# Patient Record
Sex: Male | Born: 1950 | Race: White | Hispanic: No | Marital: Married | Smoking: Current every day smoker
Health system: Southern US, Community
[De-identification: ages and names within clinical notes are randomized; demographics above are authoritative.]

## PROBLEM LIST (undated history)

## (undated) DIAGNOSIS — Z720 Tobacco use: Secondary | ICD-10-CM

## (undated) DIAGNOSIS — J449 Chronic obstructive pulmonary disease, unspecified: Secondary | ICD-10-CM

## (undated) DIAGNOSIS — I10 Essential (primary) hypertension: Secondary | ICD-10-CM

## (undated) DIAGNOSIS — E119 Type 2 diabetes mellitus without complications: Secondary | ICD-10-CM

## (undated) DIAGNOSIS — N189 Chronic kidney disease, unspecified: Secondary | ICD-10-CM

## (undated) HISTORY — PX: LUNG SURGERY: SHX703

---

## 2021-12-21 ENCOUNTER — Emergency Department (HOSPITAL_COMMUNITY): Payer: Medicare HMO

## 2021-12-21 ENCOUNTER — Inpatient Hospital Stay (HOSPITAL_COMMUNITY)
Admission: EM | Admit: 2021-12-21 | Discharge: 2021-12-31 | DRG: 064 | Disposition: A | Discharge status: Rehabilitation Facility | Payer: Medicare HMO | Attending: Family Medicine | Admitting: Family Medicine

## 2021-12-21 ENCOUNTER — Inpatient Hospital Stay (HOSPITAL_COMMUNITY): Payer: Medicare HMO

## 2021-12-21 ENCOUNTER — Other Ambulatory Visit: Payer: Self-pay

## 2021-12-21 ENCOUNTER — Encounter (HOSPITAL_COMMUNITY): Payer: Self-pay | Admitting: Neurology

## 2021-12-21 DIAGNOSIS — J441 Chronic obstructive pulmonary disease with (acute) exacerbation: Secondary | ICD-10-CM | POA: Diagnosis present

## 2021-12-21 DIAGNOSIS — E872 Acidosis, unspecified: Secondary | ICD-10-CM

## 2021-12-21 DIAGNOSIS — R627 Adult failure to thrive: Secondary | ICD-10-CM | POA: Diagnosis not present

## 2021-12-21 DIAGNOSIS — J449 Chronic obstructive pulmonary disease, unspecified: Secondary | ICD-10-CM | POA: Diagnosis present

## 2021-12-21 DIAGNOSIS — I6503 Occlusion and stenosis of bilateral vertebral arteries: Secondary | ICD-10-CM | POA: Diagnosis present

## 2021-12-21 DIAGNOSIS — G8191 Hemiplegia, unspecified affecting right dominant side: Secondary | ICD-10-CM | POA: Diagnosis present

## 2021-12-21 DIAGNOSIS — R609 Edema, unspecified: Secondary | ICD-10-CM | POA: Diagnosis not present

## 2021-12-21 DIAGNOSIS — E1065 Type 1 diabetes mellitus with hyperglycemia: Secondary | ICD-10-CM | POA: Diagnosis not present

## 2021-12-21 DIAGNOSIS — G9341 Metabolic encephalopathy: Secondary | ICD-10-CM | POA: Diagnosis present

## 2021-12-21 DIAGNOSIS — R140 Abdominal distension (gaseous): Secondary | ICD-10-CM | POA: Diagnosis not present

## 2021-12-21 DIAGNOSIS — Z7984 Long term (current) use of oral hypoglycemic drugs: Secondary | ICD-10-CM

## 2021-12-21 DIAGNOSIS — R911 Solitary pulmonary nodule: Secondary | ICD-10-CM

## 2021-12-21 DIAGNOSIS — I610 Nontraumatic intracerebral hemorrhage in hemisphere, subcortical: Secondary | ICD-10-CM | POA: Diagnosis present

## 2021-12-21 DIAGNOSIS — J690 Pneumonitis due to inhalation of food and vomit: Secondary | ICD-10-CM | POA: Diagnosis present

## 2021-12-21 DIAGNOSIS — G4730 Sleep apnea, unspecified: Secondary | ICD-10-CM | POA: Diagnosis present

## 2021-12-21 DIAGNOSIS — Z720 Tobacco use: Secondary | ICD-10-CM | POA: Diagnosis not present

## 2021-12-21 DIAGNOSIS — E785 Hyperlipidemia, unspecified: Secondary | ICD-10-CM | POA: Diagnosis present

## 2021-12-21 DIAGNOSIS — J9601 Acute respiratory failure with hypoxia: Secondary | ICD-10-CM | POA: Diagnosis not present

## 2021-12-21 DIAGNOSIS — I69151 Hemiplegia and hemiparesis following nontraumatic intracerebral hemorrhage affecting right dominant side: Secondary | ICD-10-CM | POA: Diagnosis not present

## 2021-12-21 DIAGNOSIS — F17210 Nicotine dependence, cigarettes, uncomplicated: Secondary | ICD-10-CM | POA: Diagnosis present

## 2021-12-21 DIAGNOSIS — I10 Essential (primary) hypertension: Secondary | ICD-10-CM | POA: Diagnosis present

## 2021-12-21 DIAGNOSIS — I615 Nontraumatic intracerebral hemorrhage, intraventricular: Secondary | ICD-10-CM | POA: Diagnosis present

## 2021-12-21 DIAGNOSIS — F10931 Alcohol use, unspecified with withdrawal delirium: Secondary | ICD-10-CM

## 2021-12-21 DIAGNOSIS — N1831 Chronic kidney disease, stage 3a: Secondary | ICD-10-CM | POA: Diagnosis not present

## 2021-12-21 DIAGNOSIS — Z808 Family history of malignant neoplasm of other organs or systems: Secondary | ICD-10-CM

## 2021-12-21 DIAGNOSIS — G9340 Encephalopathy, unspecified: Secondary | ICD-10-CM

## 2021-12-21 DIAGNOSIS — Z881 Allergy status to other antibiotic agents status: Secondary | ICD-10-CM

## 2021-12-21 DIAGNOSIS — N179 Acute kidney failure, unspecified: Secondary | ICD-10-CM | POA: Insufficient documentation

## 2021-12-21 DIAGNOSIS — E54 Ascorbic acid deficiency: Secondary | ICD-10-CM

## 2021-12-21 DIAGNOSIS — Z885 Allergy status to narcotic agent status: Secondary | ICD-10-CM

## 2021-12-21 DIAGNOSIS — R471 Dysarthria and anarthria: Secondary | ICD-10-CM | POA: Diagnosis present

## 2021-12-21 DIAGNOSIS — J9600 Acute respiratory failure, unspecified whether with hypoxia or hypercapnia: Secondary | ICD-10-CM

## 2021-12-21 DIAGNOSIS — Z79899 Other long term (current) drug therapy: Secondary | ICD-10-CM

## 2021-12-21 DIAGNOSIS — Z66 Do not resuscitate: Secondary | ICD-10-CM | POA: Diagnosis not present

## 2021-12-21 DIAGNOSIS — R1312 Dysphagia, oropharyngeal phase: Secondary | ICD-10-CM | POA: Diagnosis not present

## 2021-12-21 DIAGNOSIS — F10231 Alcohol dependence with withdrawal delirium: Secondary | ICD-10-CM | POA: Diagnosis not present

## 2021-12-21 DIAGNOSIS — I6523 Occlusion and stenosis of bilateral carotid arteries: Secondary | ICD-10-CM | POA: Diagnosis present

## 2021-12-21 DIAGNOSIS — E8721 Acute metabolic acidosis: Secondary | ICD-10-CM | POA: Diagnosis not present

## 2021-12-21 DIAGNOSIS — Z7141 Alcohol abuse counseling and surveillance of alcoholic: Secondary | ICD-10-CM

## 2021-12-21 DIAGNOSIS — F17200 Nicotine dependence, unspecified, uncomplicated: Secondary | ICD-10-CM | POA: Diagnosis not present

## 2021-12-21 DIAGNOSIS — E1169 Type 2 diabetes mellitus with other specified complication: Secondary | ICD-10-CM | POA: Diagnosis not present

## 2021-12-21 DIAGNOSIS — Z825 Family history of asthma and other chronic lower respiratory diseases: Secondary | ICD-10-CM

## 2021-12-21 DIAGNOSIS — I639 Cerebral infarction, unspecified: Secondary | ICD-10-CM | POA: Diagnosis not present

## 2021-12-21 DIAGNOSIS — E559 Vitamin D deficiency, unspecified: Secondary | ICD-10-CM | POA: Diagnosis present

## 2021-12-21 DIAGNOSIS — Z7189 Other specified counseling: Secondary | ICD-10-CM | POA: Diagnosis not present

## 2021-12-21 DIAGNOSIS — Z85828 Personal history of other malignant neoplasm of skin: Secondary | ICD-10-CM | POA: Diagnosis not present

## 2021-12-21 DIAGNOSIS — I619 Nontraumatic intracerebral hemorrhage, unspecified: Secondary | ICD-10-CM | POA: Diagnosis present

## 2021-12-21 DIAGNOSIS — R1310 Dysphagia, unspecified: Secondary | ICD-10-CM | POA: Diagnosis not present

## 2021-12-21 DIAGNOSIS — Z7982 Long term (current) use of aspirin: Secondary | ICD-10-CM

## 2021-12-21 DIAGNOSIS — I82401 Acute embolism and thrombosis of unspecified deep veins of right lower extremity: Secondary | ICD-10-CM | POA: Diagnosis not present

## 2021-12-21 DIAGNOSIS — Z515 Encounter for palliative care: Secondary | ICD-10-CM | POA: Diagnosis not present

## 2021-12-21 DIAGNOSIS — G479 Sleep disorder, unspecified: Secondary | ICD-10-CM | POA: Diagnosis not present

## 2021-12-21 DIAGNOSIS — E119 Type 2 diabetes mellitus without complications: Secondary | ICD-10-CM

## 2021-12-21 DIAGNOSIS — I161 Hypertensive emergency: Secondary | ICD-10-CM | POA: Diagnosis present

## 2021-12-21 DIAGNOSIS — I6389 Other cerebral infarction: Secondary | ICD-10-CM | POA: Diagnosis not present

## 2021-12-21 DIAGNOSIS — Z888 Allergy status to other drugs, medicaments and biological substances status: Secondary | ICD-10-CM

## 2021-12-21 DIAGNOSIS — Z823 Family history of stroke: Secondary | ICD-10-CM | POA: Diagnosis not present

## 2021-12-21 DIAGNOSIS — R414 Neurologic neglect syndrome: Secondary | ICD-10-CM | POA: Diagnosis present

## 2021-12-21 DIAGNOSIS — R0603 Acute respiratory distress: Secondary | ICD-10-CM | POA: Diagnosis not present

## 2021-12-21 DIAGNOSIS — E781 Pure hyperglyceridemia: Secondary | ICD-10-CM | POA: Diagnosis present

## 2021-12-21 DIAGNOSIS — R29709 NIHSS score 9: Secondary | ICD-10-CM | POA: Diagnosis present

## 2021-12-21 DIAGNOSIS — E878 Other disorders of electrolyte and fluid balance, not elsewhere classified: Secondary | ICD-10-CM | POA: Diagnosis not present

## 2021-12-21 DIAGNOSIS — F1090 Alcohol use, unspecified, uncomplicated: Secondary | ICD-10-CM

## 2021-12-21 DIAGNOSIS — R809 Proteinuria, unspecified: Secondary | ICD-10-CM | POA: Diagnosis not present

## 2021-12-21 DIAGNOSIS — F1010 Alcohol abuse, uncomplicated: Secondary | ICD-10-CM | POA: Diagnosis not present

## 2021-12-21 DIAGNOSIS — E875 Hyperkalemia: Secondary | ICD-10-CM | POA: Diagnosis present

## 2021-12-21 HISTORY — DX: Essential (primary) hypertension: I10

## 2021-12-21 HISTORY — DX: Chronic kidney disease, unspecified: N18.9

## 2021-12-21 HISTORY — DX: Chronic obstructive pulmonary disease, unspecified: J44.9

## 2021-12-21 HISTORY — DX: Type 2 diabetes mellitus without complications: E11.9

## 2021-12-21 HISTORY — DX: Tobacco use: Z72.0

## 2021-12-21 LAB — I-STAT CHEM 8, ED
BUN: 30 mg/dL — ABNORMAL HIGH (ref 8–23)
Calcium, Ion: 1.08 mmol/L — ABNORMAL LOW (ref 1.15–1.40)
Chloride: 106 mmol/L (ref 98–111)
Creatinine, Ser: 1.2 mg/dL (ref 0.61–1.24)
Glucose, Bld: 118 mg/dL — ABNORMAL HIGH (ref 70–99)
HCT: 42 % (ref 39.0–52.0)
Hemoglobin: 14.3 g/dL (ref 13.0–17.0)
Potassium: 4.4 mmol/L (ref 3.5–5.1)
Sodium: 136 mmol/L (ref 135–145)
TCO2: 20 mmol/L — ABNORMAL LOW (ref 22–32)

## 2021-12-21 LAB — COMPREHENSIVE METABOLIC PANEL
ALT: 17 U/L (ref 0–44)
AST: 22 U/L (ref 15–41)
Albumin: 3.1 g/dL — ABNORMAL LOW (ref 3.5–5.0)
Alkaline Phosphatase: 51 U/L (ref 38–126)
Anion gap: 10 (ref 5–15)
BUN: 32 mg/dL — ABNORMAL HIGH (ref 8–23)
CO2: 22 mmol/L (ref 22–32)
Calcium: 8.9 mg/dL (ref 8.9–10.3)
Chloride: 103 mmol/L (ref 98–111)
Creatinine, Ser: 1.36 mg/dL — ABNORMAL HIGH (ref 0.61–1.24)
GFR, Estimated: 57 mL/min — ABNORMAL LOW (ref >60)
Glucose, Bld: 129 mg/dL — ABNORMAL HIGH (ref 70–99)
Potassium: 4.8 mmol/L (ref 3.5–5.1)
Sodium: 135 mmol/L (ref 135–145)
Total Bilirubin: 0.2 mg/dL — ABNORMAL LOW (ref 0.3–1.2)
Total Protein: 5.8 g/dL — ABNORMAL LOW (ref 6.5–8.1)

## 2021-12-21 LAB — MRSA NEXT GEN BY PCR, NASAL: MRSA by PCR Next Gen: NOT DETECTED

## 2021-12-21 LAB — CBC
HCT: 43.9 % (ref 39.0–52.0)
Hemoglobin: 14.8 g/dL (ref 13.0–17.0)
MCH: 30 pg (ref 26.0–34.0)
MCHC: 33.7 g/dL (ref 30.0–36.0)
MCV: 89 fL (ref 80.0–100.0)
Platelets: 286 10*3/uL (ref 150–400)
RBC: 4.93 MIL/uL (ref 4.22–5.81)
RDW: 12.8 % (ref 11.5–15.5)
WBC: 6.8 10*3/uL (ref 4.0–10.5)
nRBC: 0 % (ref 0.0–0.2)

## 2021-12-21 LAB — TYPE AND SCREEN
ABO/RH(D): A POS
Antibody Screen: NEGATIVE

## 2021-12-21 LAB — ABO/RH: ABO/RH(D): A POS

## 2021-12-21 LAB — DIFFERENTIAL
Abs Immature Granulocytes: 0.03 10*3/uL (ref 0.00–0.07)
Basophils Absolute: 0.1 10*3/uL (ref 0.0–0.1)
Basophils Relative: 1 %
Eosinophils Absolute: 0.2 10*3/uL (ref 0.0–0.5)
Eosinophils Relative: 3 %
Immature Granulocytes: 0 %
Lymphocytes Relative: 17 %
Lymphs Abs: 1.2 10*3/uL (ref 0.7–4.0)
Monocytes Absolute: 0.8 10*3/uL (ref 0.1–1.0)
Monocytes Relative: 11 %
Neutro Abs: 4.6 10*3/uL (ref 1.7–7.7)
Neutrophils Relative %: 68 %

## 2021-12-21 LAB — HEMOGLOBIN A1C
Hgb A1c MFr Bld: 6.1 % — ABNORMAL HIGH (ref 4.8–5.6)
Mean Plasma Glucose: 128.37 mg/dL

## 2021-12-21 LAB — LIPID PANEL
Cholesterol: 321 mg/dL — ABNORMAL HIGH (ref 0–200)
HDL: 61 mg/dL (ref >40)
LDL Cholesterol: 192 mg/dL — ABNORMAL HIGH (ref 0–99)
Total CHOL/HDL Ratio: 5.3 RATIO
Triglycerides: 339 mg/dL — ABNORMAL HIGH (ref <150)
VLDL: 68 mg/dL — ABNORMAL HIGH (ref 0–40)

## 2021-12-21 LAB — GLUCOSE, CAPILLARY
Glucose-Capillary: 143 mg/dL — ABNORMAL HIGH (ref 70–99)
Glucose-Capillary: 133 mg/dL — ABNORMAL HIGH (ref 70–99)
Glucose-Capillary: 132 mg/dL — ABNORMAL HIGH (ref 70–99)

## 2021-12-21 LAB — APTT: aPTT: 29 seconds (ref 24–36)

## 2021-12-21 LAB — ETHANOL: Alcohol, Ethyl (B): <10 mg/dL (ref <10)

## 2021-12-21 LAB — PROTIME-INR
INR: 0.9 (ref 0.8–1.2)
Prothrombin Time: 12 seconds (ref 11.4–15.2)

## 2021-12-21 LAB — HIV ANTIBODY (ROUTINE TESTING W REFLEX): HIV Screen 4th Generation wRfx: NONREACTIVE

## 2021-12-21 IMAGING — CT CT HEAD CODE STROKE
3 series · 15 of 47 positions shown, 18 images · non-contrast
Comparison: None Available.

CLINICAL DATA: Code stroke.



[Series 3: head 5.0 st · axial · 0.44mm/px · z∈[-98,+52]mm · 9 of 36 slices shown, 12 images]
[im 3/36  brain]
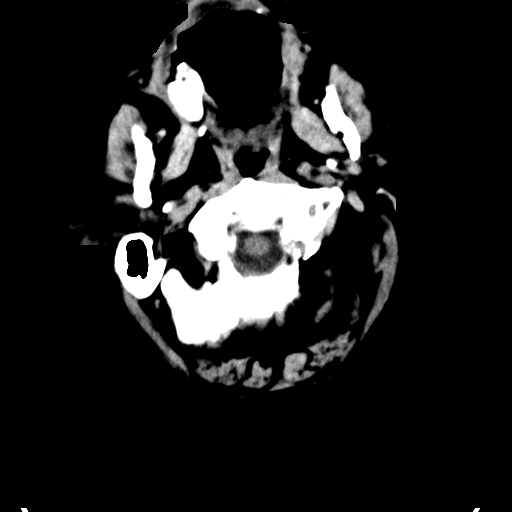
[im 3/36  bone]
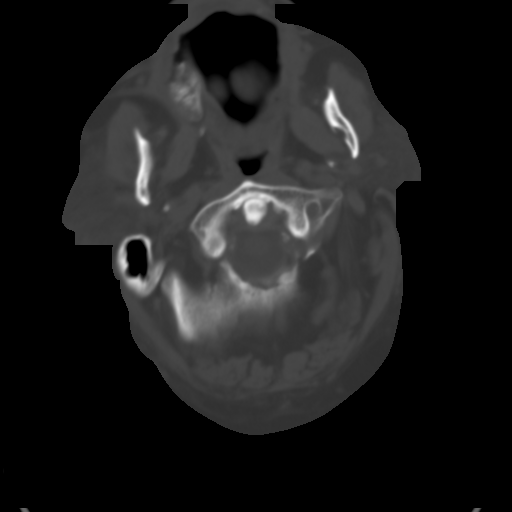
[im 7/36  brain]
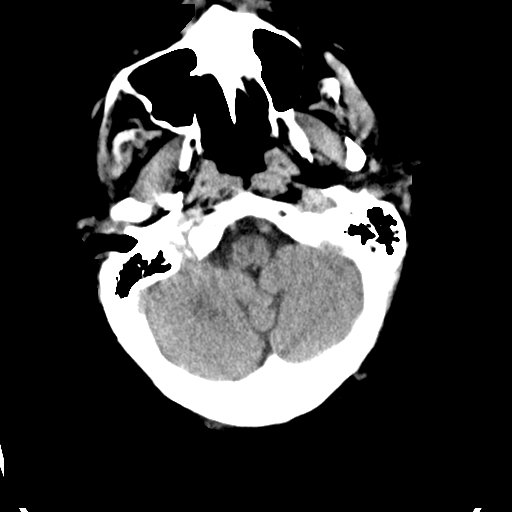
[im 10/36  brain]
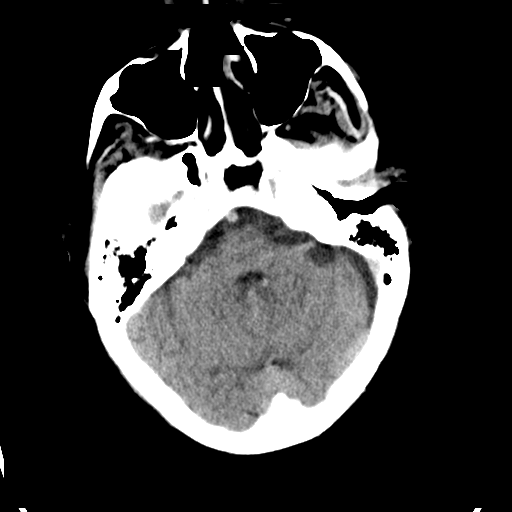
[im 14/36  brain]
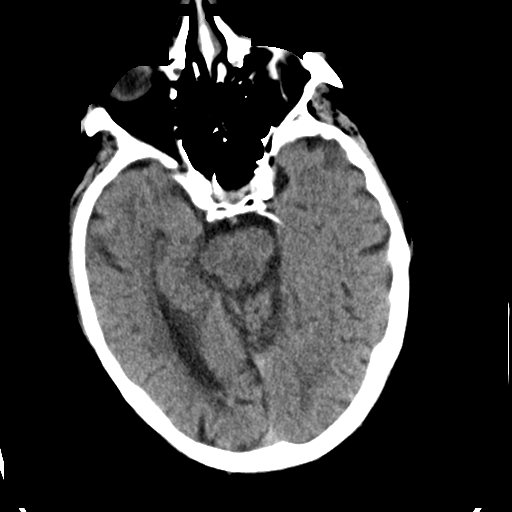
[im 19/36  brain]
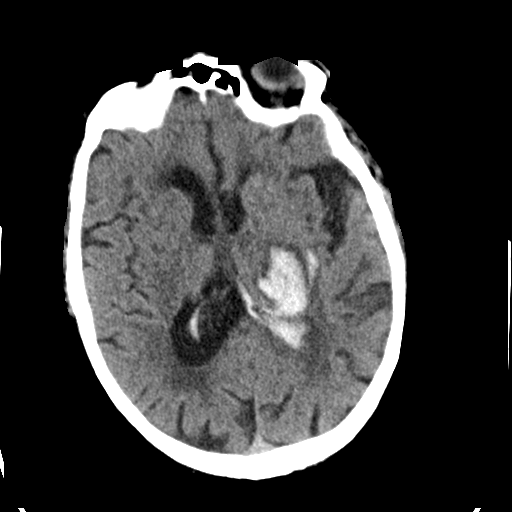
[im 19/36  bone]
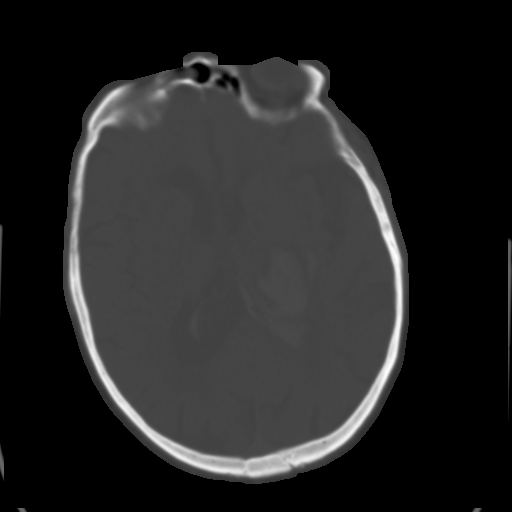
[im 22/36  brain]
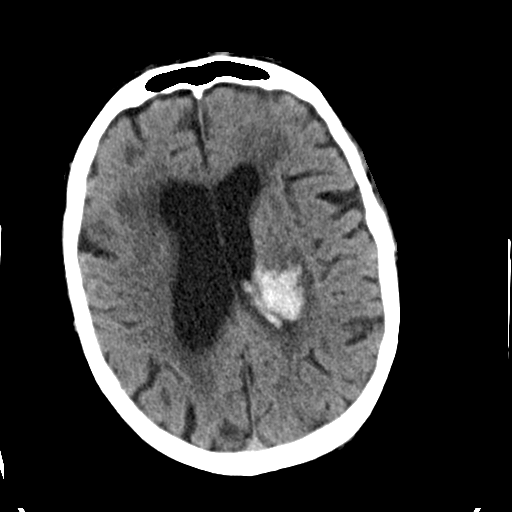
[im 26/36  brain]
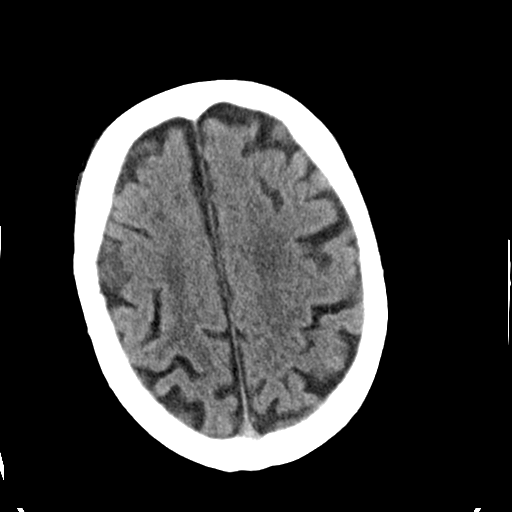
[im 29/36  brain]
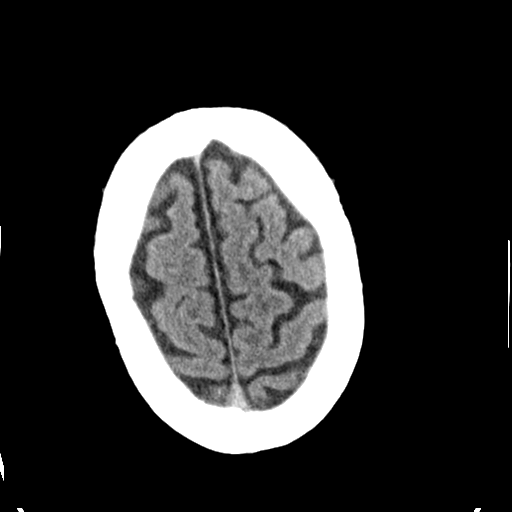
[im 33/36  brain]
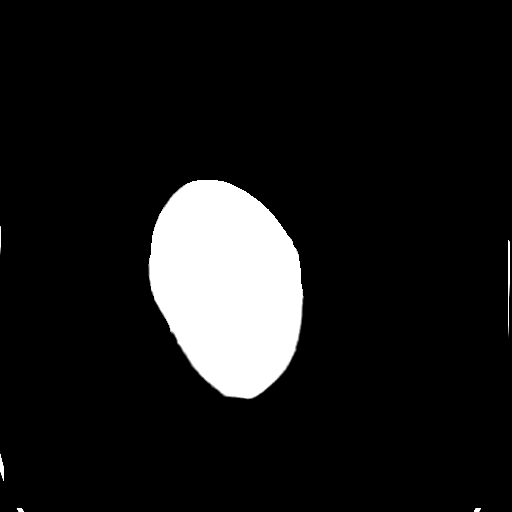
[im 33/36  bone]
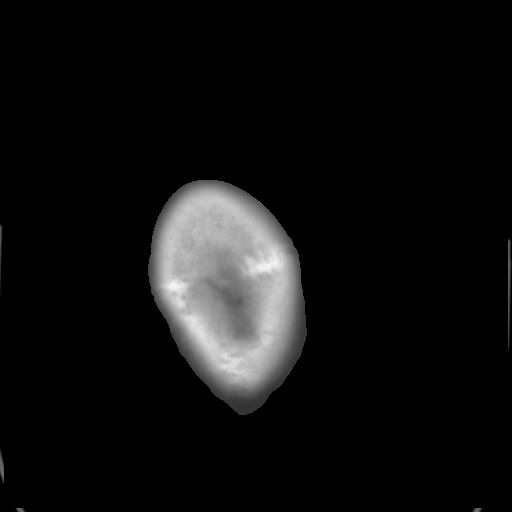

[Series 5: head 3.0 cor st · coronal · 0.34mm/px · 3 of 73 slices shown]
[im 25/73  brain]
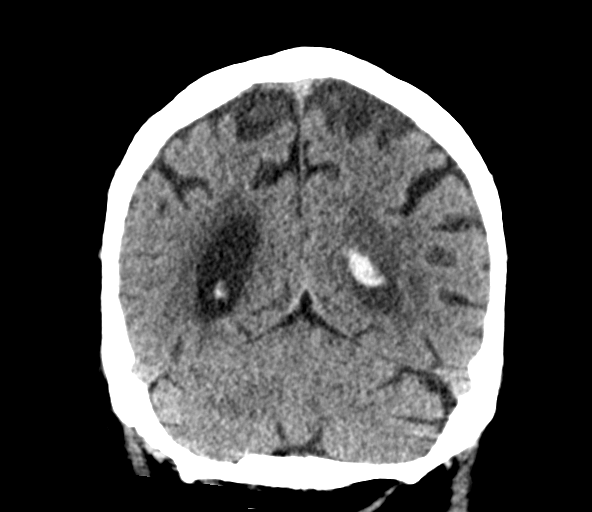
[im 33/73  brain]
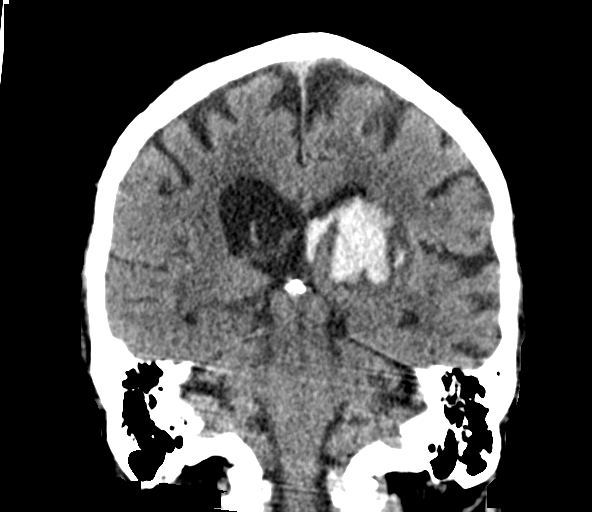
[im 41/73  brain]
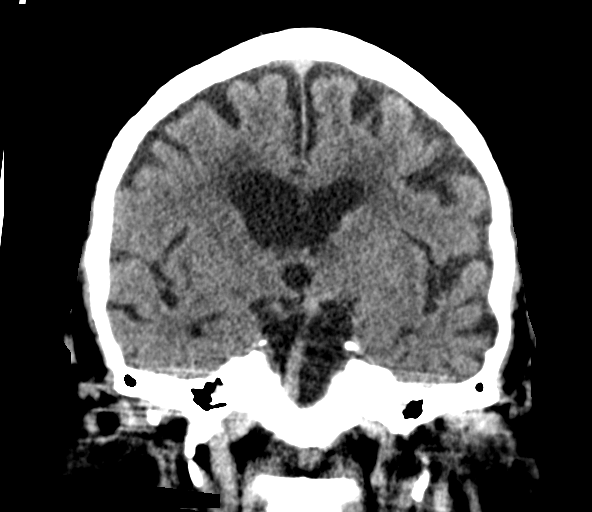

[Series 6: head 3.0 sag st · sagittal · 0.37mm/px · 3 of 67 slices shown]
[im 25/67  brain]
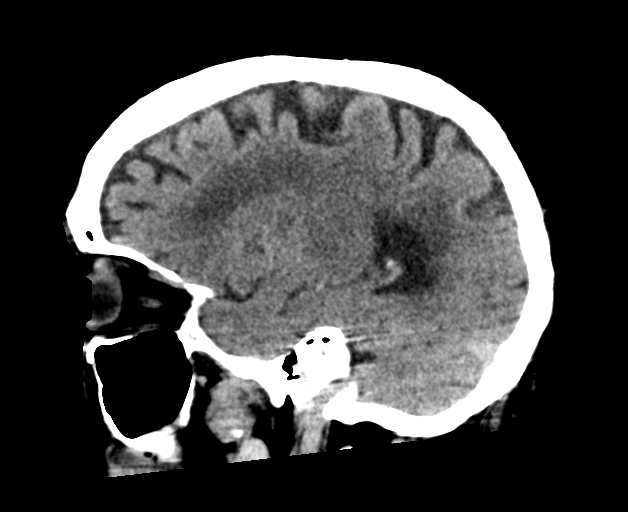
[im 34/67  brain]
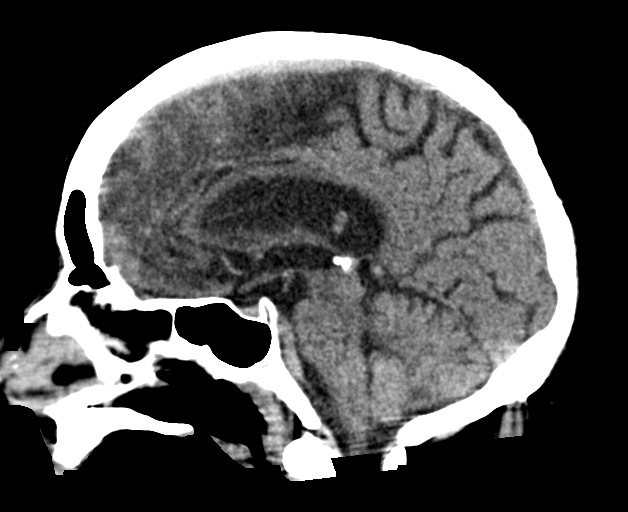
[im 43/67  brain]
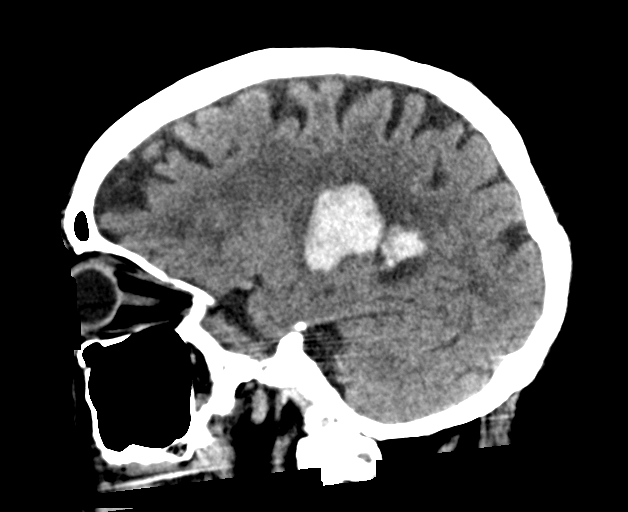

[15 of 47 positions shown; findings below may reference images not displayed]

FINDINGS: Brain: Hyperdense intraparenchymal hemorrhage centered in the left
thalamus and medial basal ganglia, extending into the left lateral
ventricle. Surrounding hypodensity, likely mild edema. Mild local
mass effect. No midline shift.

No evidence of acute infarction, cerebral edema, mass, mass effect,
or midline shift. No hydrocephalus. Periventricular white matter
changes, likely the sequela of chronic small vessel ischemic
disease.

Vascular: No hyperdense vessel. Atherosclerotic calcifications in
the intracranial carotid and vertebral arteries.

Skull: Normal. Negative for fracture or focal lesion.

Sinuses/Orbits: Minimal mucosal thickening in the right maxillary
sinus. The orbits are unremarkable.

Other: The mastoid air cells are well aerated.

ASPECTS ([place] Stroke Program Early CT Score)

- Ganglionic level infarction (caudate, lentiform nuclei, internal
capsule, insula, M1-M3 cortex): 7

- Supraganglionic infarction (M4-M6 cortex): 3

Total score (0-10 with 10 being normal): 10
IMPRESSION: 1. Acute intraparenchymal hemorrhage centered in the left thalamus
and medial basal ganglia, with intraventricular extension, primarily
into the left lateral ventricle. Mild surrounding edema and local
mass effect without significant midline shift.
2. ASPECTS is 10

Code stroke imaging results were communicated on [DATE] at [DATE] to provider Dr. RICARDO HENRIQUE via secure text paging.

## 2021-12-21 IMAGING — CT CT ANGIO HEAD-NECK (W OR W/O PERF)
2 of 7 series · 8 of 33 positions shown · IV contrast (OMNI 350)
Comparison: CT head from the same day.

CLINICAL DATA: Code stroke.

EXAM:
CT ANGIOGRAPHY HEAD AND NECK
TECHNIQUE: Multidetector CT imaging of the head and neck was performed using
the standard protocol during bolus administration of intravenous
contrast. Multiplanar CT image reconstructions and MIPs were
obtained to evaluate the vascular anatomy. Carotid stenosis
measurements (when applicable) are obtained utilizing NASCET
criteria, using the distal internal carotid diameter as the
denominator.

[Series 5: cta neck · axial · 0.49mm/px · z∈[-204,-84]mm · 2 of 182 slices shown]
[im 61/182  soft-tissue]
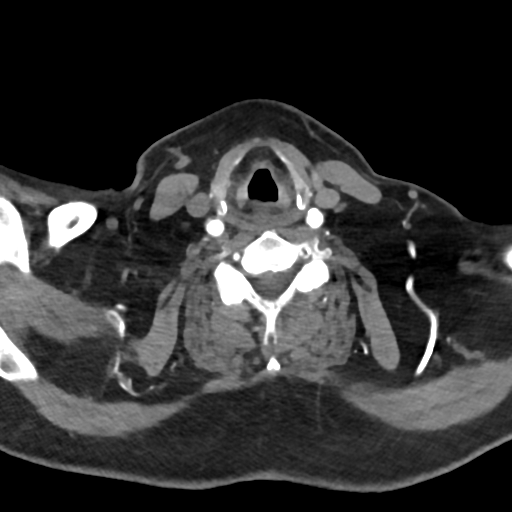
[im 121/182  soft-tissue]
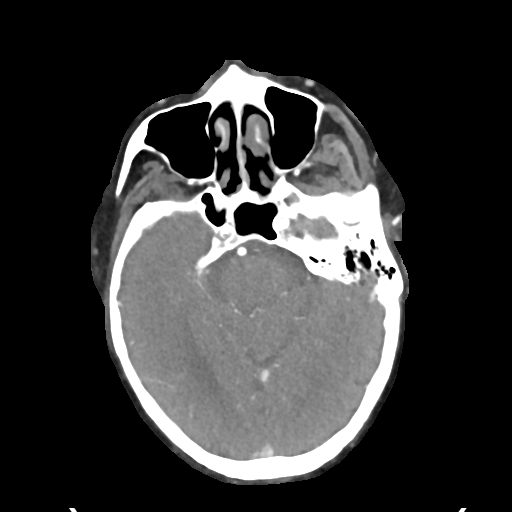

[Series 7: cta neck axial · axial · 0.39mm/px · z∈[-273,-15]mm · 6 of 362 slices shown]
[im 52/362  soft-tissue]
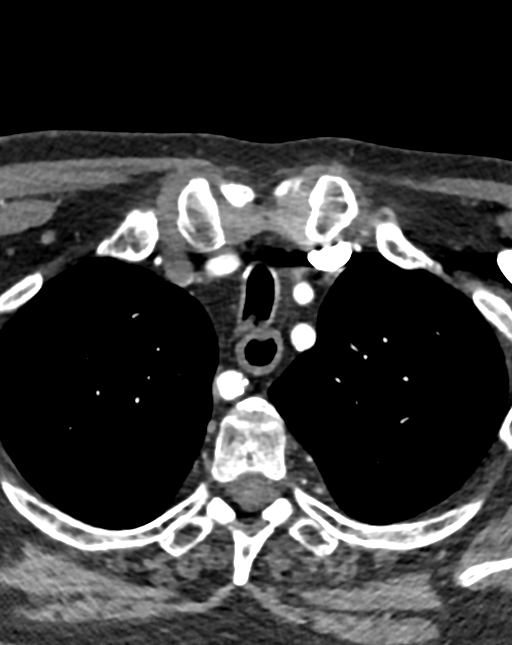
[im 104/362  bone]
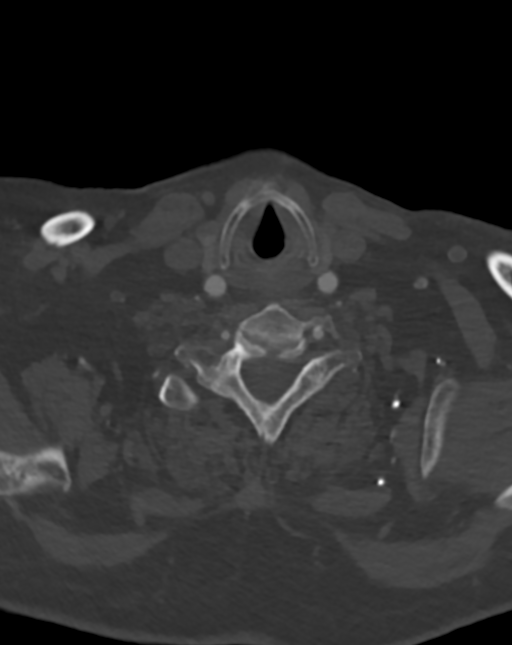
[im 155/362  soft-tissue]
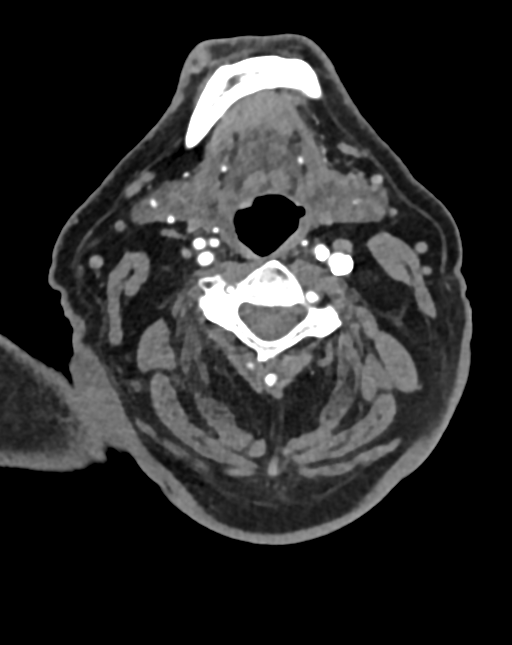
[im 207/362  bone]
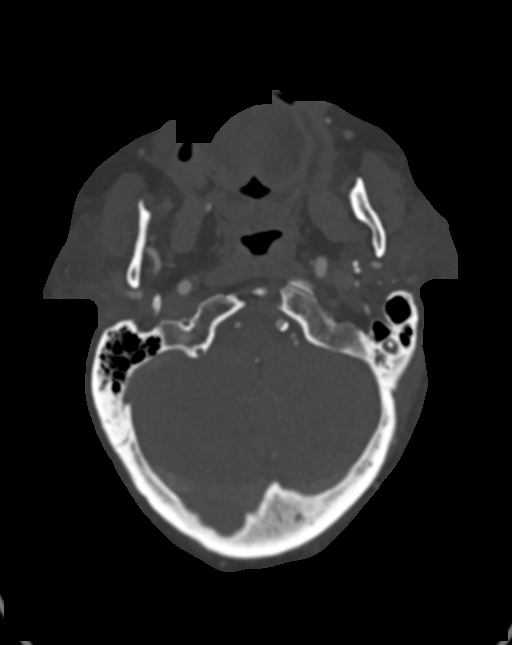
[im 258/362  soft-tissue]
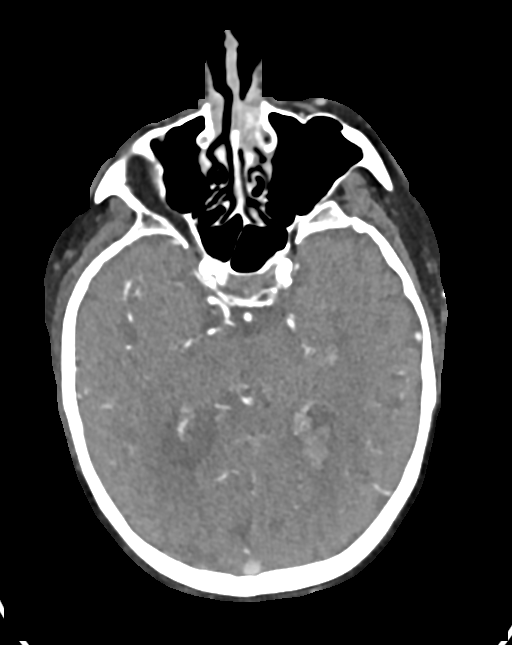
[im 310/362  bone]
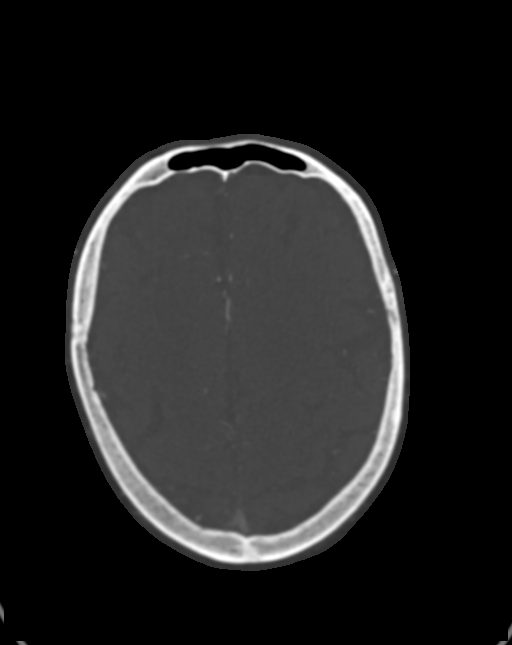

[8 of 33 positions shown; findings below may reference images not displayed]

RADIATION DOSE REDUCTION: This exam was performed according to the
departmental dose-optimization program which includes automated
exposure control, adjustment of the mA and/or kV according to
patient size and/or use of iterative reconstruction technique.

CONTRAST:  75mL OMNIPAQUE IOHEXOL 350 MG/ML SOLN
FINDINGS: CTA NECK FINDINGS

Aortic arch: Aortic atherosclerosis. Great vessel origins are patent
without significant stenosis. Air where it origin of the subclavian
artery on the right, which courses behind the esophagus and trachea.

Right carotid system: Atherosclerosis at the carotid bifurcation
without greater than 50% stenosis.

Left carotid system: Atherosclerosis at the carotid bifurcation and
involving the proximal ICA without greater than 50% stenosis.

Vertebral arteries: Severe stenosis of bilateral proximal vertebral
arteries. Moderate stenosis of the more distal right V2 vertebral
artery. Left dominant.

Skeleton: No acute findings.

Other neck: No acute findings.

Upper chest: Emphysema.

Review of the MIP images confirms the above findings

CTA HEAD FINDINGS

Anterior circulation: Calcific atherosclerosis of bilateral
intracranial ICAs. The calcific nature of plaque limits accurate
quantification of the stenosis, which is approximately
mild-to-moderate. Bilateral MCAs and ACAs are patent. Small
infundibulum arising from the left supraclinoid ICA posteriorly.

Posterior circulation: Left dominant vertebral artery. Severe
stenosis of the right vertebral artery at its dural margin. Moderate
stenosis of the proximal left intradural vertebral artery. Basilar
artery and posterior cerebral arteries are patent without proximal
high-grade stenosis. Right fetal type PCA, anatomic variant.

Venous sinuses: As permitted by contrast timing, patent.

Anatomic variants: Detailed above.

Review of the MIP images confirms the above findings
IMPRESSION: CTA head:

1. No emergent large vessel occlusion.
2. No evidence of aneurysm or vascular malformation in the region of
the acute left thalamic hemorrhage; however, acute blood products
limits assessment. Follow-up vascular imaging could further evaluate
if clinically warranted.
3. Severe right and moderate left intradural vertebral artery
stenosis.

CTA neck:

1. Severe stenosis of proximal bilateral vertebral arteries in the
neck.
2. Bilateral carotid bifurcation atherosclerosis without greater
than 50% stenosis.
3. Aortic Atherosclerosis ([6S]-[6S]) and emphysema ([6S]-[6S]).

## 2021-12-21 IMAGING — CT CT HEAD W/O CM
2 of 3 series · 13 of 47 positions shown, 16 images · non-contrast
Comparison: CT earlier today at [DATE]

CLINICAL DATA: Right thalamic bleed.  Follow-up.



[Series 3: head 5.0 mpr ax · axial · 0.34mm/px · z∈[-177,-38]mm · 10 of 34 slices shown, 13 images]
[im 3/34  brain]
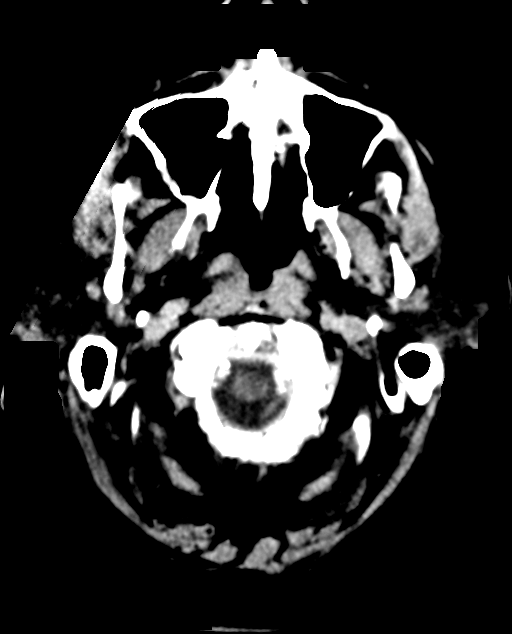
[im 3/34  bone]
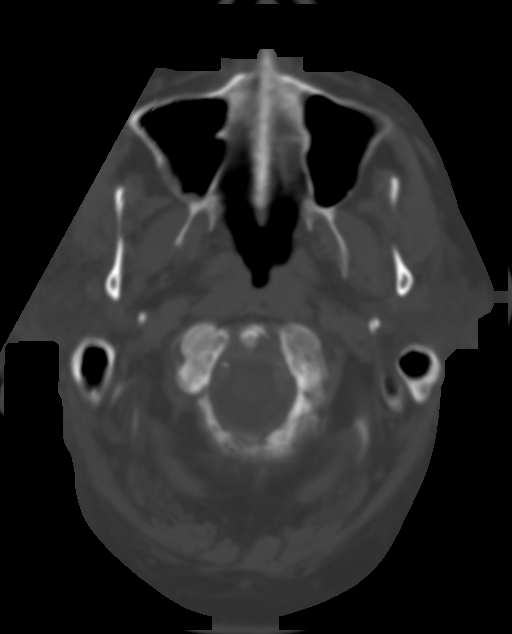
[im 6/34  brain]
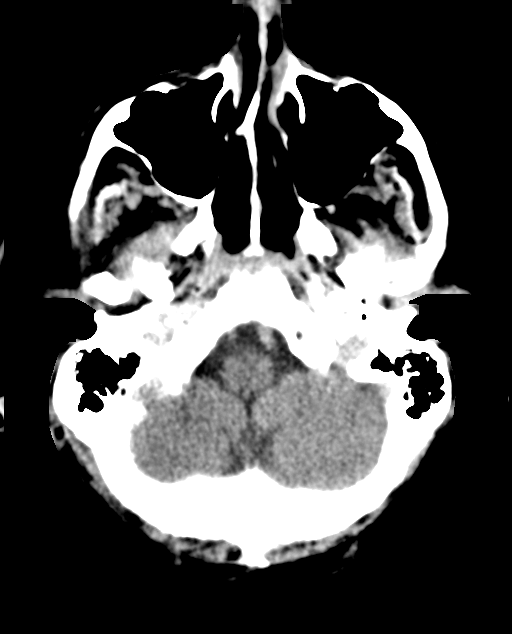
[im 10/34  brain]
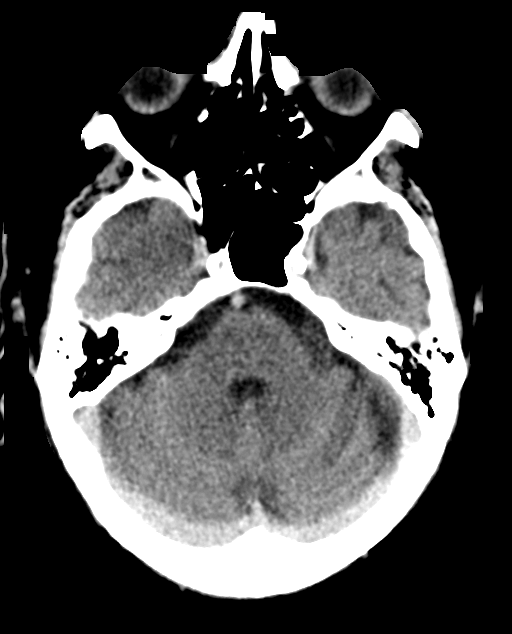
[im 12/34  brain]
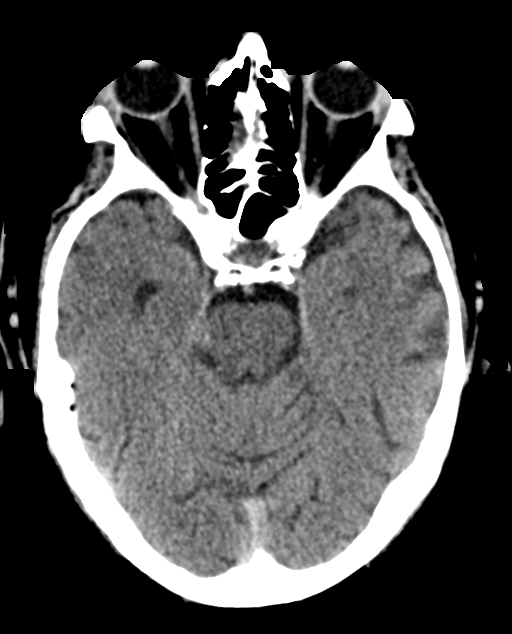
[im 15/34  brain]
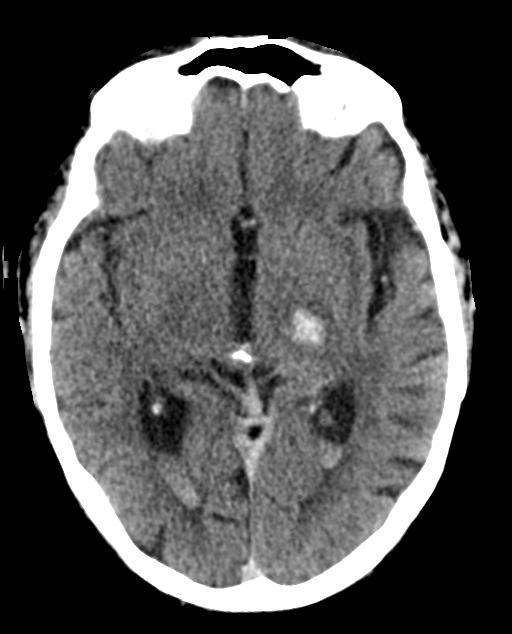
[im 15/34  bone]
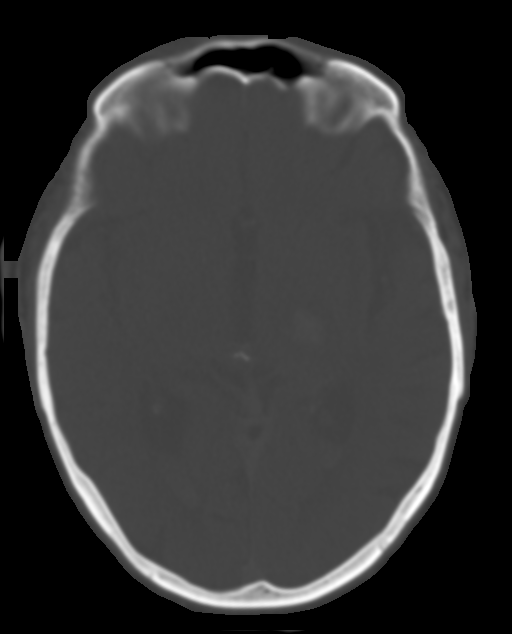
[im 19/34  brain]
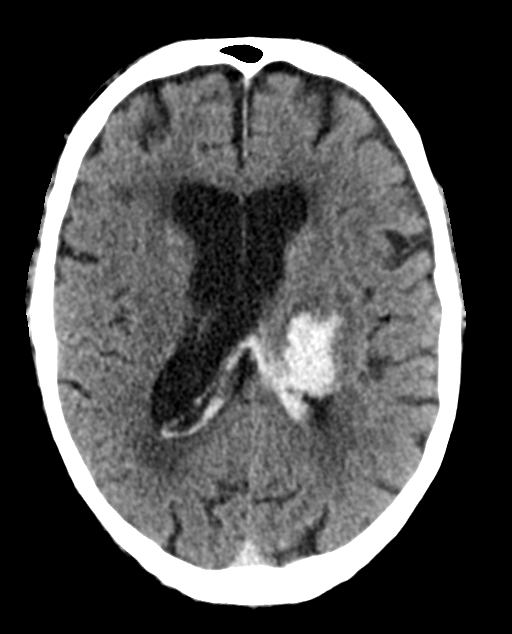
[im 22/34  brain]
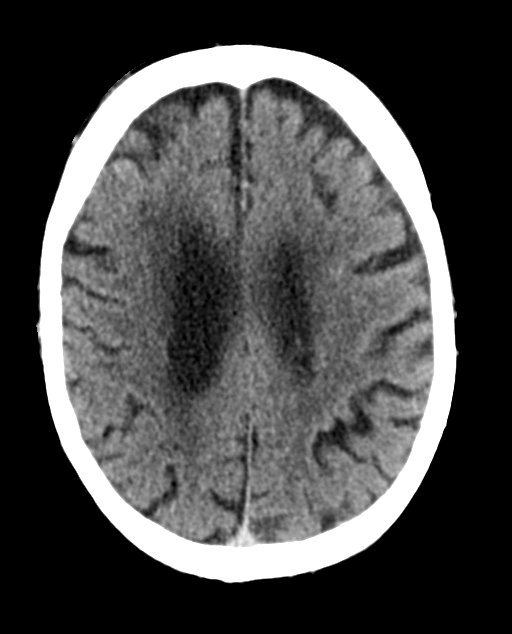
[im 26/34  brain]
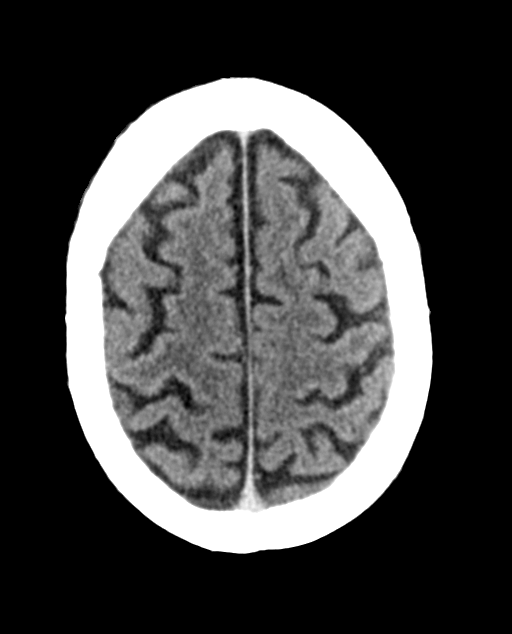
[im 28/34  brain]
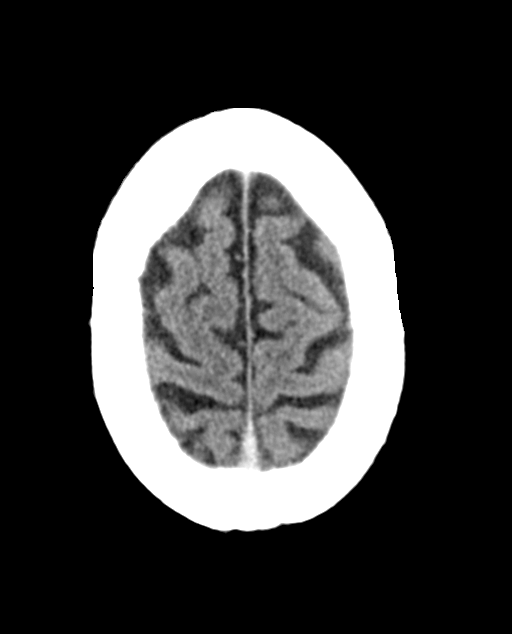
[im 28/34  bone]
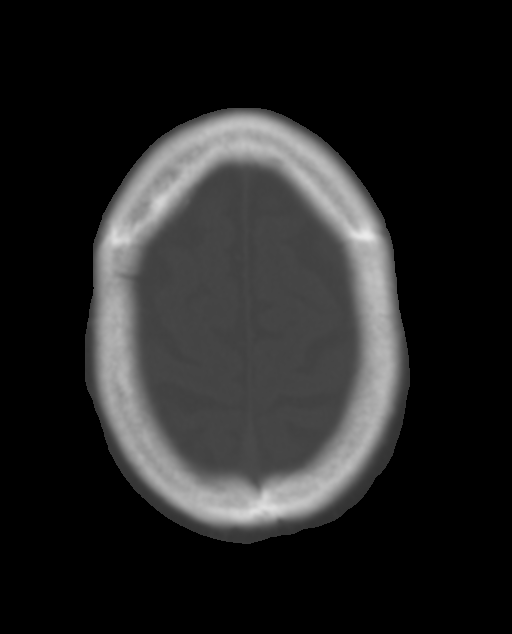
[im 31/34  brain]
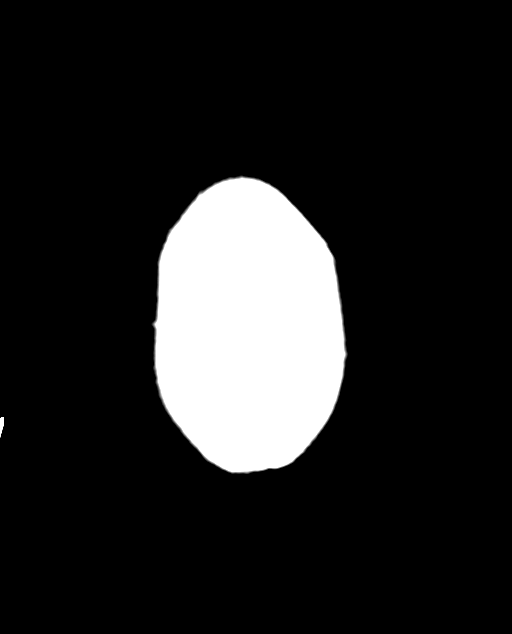

[Series 6: head 3.0 mpr cor · coronal · 0.33mm/px · 3 of 71 slices shown]
[im 24/71  brain]
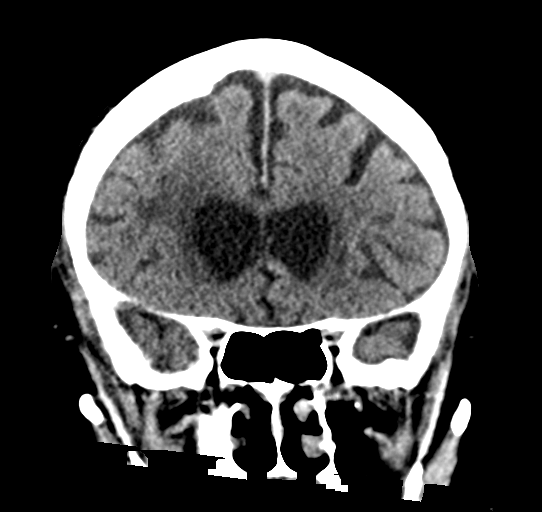
[im 32/71  brain]
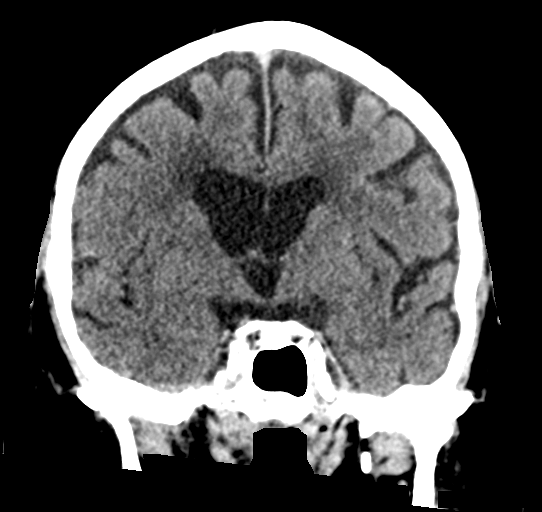
[im 39/71  brain]
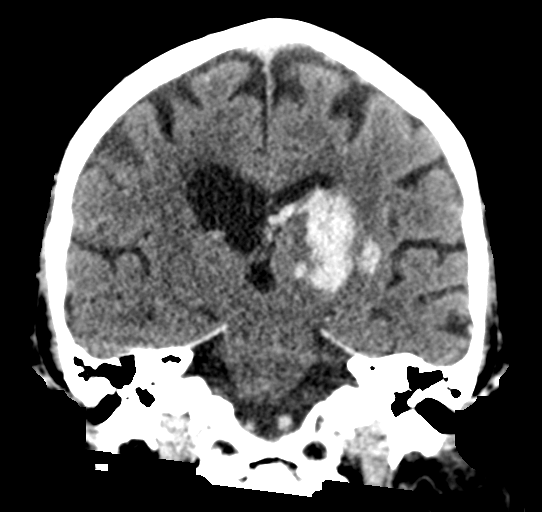

[13 of 47 positions shown; findings below may reference images not displayed]

FINDINGS: Brain: Hyperdense left thalamocapsular bleed is again noted,
measuring 3.0 x 3.0 cm AP and transverse 3.6 cm in height. There
were similar AP and transverse axis measurements earlier today but
interval slight increase in height with the bleed measuring 3.3 cm
height previously.

There is intraventricular hemorrhagic extension with hyperdense
hemorrhage slightly increased in the posterior horns of both lateral
ventricles in the interval. This is more so on the left.

There is background moderately developed atrophy, small vessel
disease and atrophic ventriculomegaly with tiny chronic lacunar
infarcts in both basal ganglia.

No new hemorrhage, cortical based infarct or mass are seen. There is
3 mm of midline shift to the right of the septum pellucidum with
only mild local mass effect around the hemorrhage with small margin
of vasogenic edema.

Vascular: The carotid siphons and distal vertebral arteries are
heavily calcified. There is contrast in the vasculature from today's
earlier CTA head and neck. No large vessel occlusion is seen with
nondedicated technique.

Skull: No skull fracture or focal lesion.

Sinuses/Orbits: There is mild membrane thickening in the frontal and
ethmoid sinuses and right maxillary sinus. No fluid levels.

Other: Minimal fluid is again noted in the inferior mastoid air
cells on both sides.
IMPRESSION: 1. 3 x 3 x 3.6 cm left thalamocapsular hemorrhage with
intraventricular extension, slightly greater in height but otherwise
unchanged.
2. 3 mm left-to-right midline shift, with slight increased
intraventricular hemorrhagic content than previously.
3. Background chronic changes described above.
4. Sinus disease, minimal mastoid effusions.
5. Carotid and vertebral artery atherosclerosis.

## 2021-12-21 MED ORDER — ACETAMINOPHEN 325 MG PO TABS
650.0000 mg | ORAL_TABLET | ORAL | Status: DC | PRN
  Administered 2021-12-28 – 2021-12-29 (×2): 650 mg via ORAL
  Filled 2021-12-21 (×2): qty 2

## 2021-12-21 MED ORDER — SODIUM CHLORIDE 0.9 % IV SOLN: INTRAVENOUS | Status: DC

## 2021-12-21 MED ORDER — CLEVIDIPINE BUTYRATE 0.5 MG/ML IV EMUL
INTRAVENOUS | Status: AC
  Administered 2021-12-21: 2 mg/h via INTRAVENOUS
  Filled 2021-12-21: qty 50

## 2021-12-21 MED ORDER — CLEVIDIPINE BUTYRATE 0.5 MG/ML IV EMUL: 0.0000 mg/h | INTRAVENOUS | Status: DC

## 2021-12-21 MED ORDER — SENNOSIDES-DOCUSATE SODIUM 8.6-50 MG PO TABS
1.0000 | ORAL_TABLET | Freq: Two times a day (BID) | ORAL | Status: DC
  Administered 2021-12-25 – 2021-12-31 (×12): 1 via ORAL
  Filled 2021-12-21 (×14): qty 1

## 2021-12-21 MED ORDER — THIAMINE HCL 100 MG/ML IJ SOLN: 100.0000 mg | Freq: Every day | INTRAMUSCULAR | Status: DC

## 2021-12-21 MED ORDER — STROKE: EARLY STAGES OF RECOVERY BOOK
Freq: Once | Status: AC
  Filled 2021-12-21: qty 1

## 2021-12-21 MED ORDER — LORAZEPAM 1 MG PO TABS: 1.0000 mg | ORAL_TABLET | ORAL | Status: DC | PRN

## 2021-12-21 MED ORDER — LORAZEPAM 2 MG/ML IJ SOLN: 1.0000 mg | INTRAMUSCULAR | Status: DC | PRN

## 2021-12-21 MED ORDER — IPRATROPIUM-ALBUTEROL 0.5-2.5 (3) MG/3ML IN SOLN
3.0000 mL | Freq: Once | RESPIRATORY_TRACT | Status: AC
  Administered 2021-12-21: 3 mL via RESPIRATORY_TRACT
  Filled 2021-12-21: qty 3

## 2021-12-21 MED ORDER — THIAMINE HCL 100 MG/ML IJ SOLN
500.0000 mg | Freq: Three times a day (TID) | INTRAVENOUS | Status: AC
  Administered 2021-12-21 – 2021-12-26 (×15): 500 mg via INTRAVENOUS
  Filled 2021-12-21 (×17): qty 5

## 2021-12-21 MED ORDER — NICOTINE 21 MG/24HR TD PT24
21.0000 mg | MEDICATED_PATCH | Freq: Every day | TRANSDERMAL | Status: DC
  Administered 2021-12-21 – 2021-12-31 (×10): 21 mg via TRANSDERMAL
  Filled 2021-12-21 (×10): qty 1

## 2021-12-21 MED ORDER — ALBUTEROL SULFATE (2.5 MG/3ML) 0.083% IN NEBU
2.5000 mg | INHALATION_SOLUTION | Freq: Once | RESPIRATORY_TRACT | Status: DC
Start: 2021-12-21 — End: 2021-12-21

## 2021-12-21 MED ORDER — ORAL CARE MOUTH RINSE
15.0000 mL | Freq: Two times a day (BID) | OROMUCOSAL | Status: DC
  Administered 2021-12-21 – 2021-12-24 (×7): 15 mL via OROMUCOSAL

## 2021-12-21 MED ORDER — PANTOPRAZOLE SODIUM 40 MG IV SOLR
40.0000 mg | Freq: Every day | INTRAVENOUS | Status: DC
  Administered 2021-12-21: 40 mg via INTRAVENOUS
  Filled 2021-12-21: qty 10

## 2021-12-21 MED ORDER — LORAZEPAM 2 MG/ML IJ SOLN: 0.0000 mg | Freq: Three times a day (TID) | INTRAMUSCULAR | Status: DC

## 2021-12-21 MED ORDER — ACETAMINOPHEN 650 MG RE SUPP: 650.0000 mg | RECTAL | Status: DC | PRN

## 2021-12-21 MED ORDER — ACETAMINOPHEN 160 MG/5ML PO SOLN: 650.0000 mg | ORAL | Status: DC | PRN

## 2021-12-21 MED ORDER — CLEVIDIPINE BUTYRATE 0.5 MG/ML IV EMUL
0.0000 mg/h | INTRAVENOUS | Status: DC
  Administered 2021-12-21: 7 mg/h via INTRAVENOUS
  Administered 2021-12-21: 9 mg/h via INTRAVENOUS
  Administered 2021-12-22: 24 mg/h via INTRAVENOUS
  Administered 2021-12-22: 17 mg/h via INTRAVENOUS
  Administered 2021-12-22: 9 mg/h via INTRAVENOUS
  Administered 2021-12-22: 32 mg/h via INTRAVENOUS
  Administered 2021-12-22: 26 mg/h via INTRAVENOUS
  Administered 2021-12-22: 22 mg/h via INTRAVENOUS
  Administered 2021-12-22: 21 mg/h via INTRAVENOUS
  Administered 2021-12-22: 32 mg/h via INTRAVENOUS
  Administered 2021-12-22: 17 mg/h via INTRAVENOUS
  Administered 2021-12-22: 24 mg/h via INTRAVENOUS
  Administered 2021-12-22: 13 mg/h via INTRAVENOUS
  Administered 2021-12-22: 26 mg/h via INTRAVENOUS
  Administered 2021-12-22: 17 mg/h via INTRAVENOUS
  Administered 2021-12-22: 24 mg/h via INTRAVENOUS
  Administered 2021-12-23: 14 mg/h via INTRAVENOUS
  Administered 2021-12-23: 18 mg/h via INTRAVENOUS
  Administered 2021-12-23: 14 mg/h via INTRAVENOUS
  Administered 2021-12-23: 20 mg/h via INTRAVENOUS
  Administered 2021-12-23: 22 mg/h via INTRAVENOUS
  Filled 2021-12-21: qty 100
  Filled 2021-12-21 (×6): qty 50
  Filled 2021-12-21 (×4): qty 100
  Filled 2021-12-21 (×4): qty 50
  Filled 2021-12-21 (×2): qty 100

## 2021-12-21 MED ORDER — LORAZEPAM 2 MG/ML IJ SOLN
1.0000 mg | INTRAMUSCULAR | Status: DC | PRN
  Administered 2021-12-22 (×3): 1 mg via INTRAVENOUS
  Administered 2021-12-22: 2 mg via INTRAVENOUS
  Filled 2021-12-21 (×4): qty 1

## 2021-12-21 MED ORDER — LABETALOL HCL 5 MG/ML IV SOLN: 20.0000 mg | Freq: Once | INTRAVENOUS | Status: DC

## 2021-12-21 MED ORDER — INSULIN ASPART 100 UNIT/ML IJ SOLN: 0.0000 [IU] | Freq: Four times a day (QID) | INTRAMUSCULAR | Status: DC

## 2021-12-21 MED ORDER — ADULT MULTIVITAMIN W/MINERALS CH
1.0000 | ORAL_TABLET | Freq: Every day | ORAL | Status: DC
  Administered 2021-12-22 – 2021-12-30 (×6): 1 via ORAL
  Filled 2021-12-21 (×7): qty 1

## 2021-12-21 MED ORDER — THIAMINE HCL 100 MG PO TABS: 100.0000 mg | ORAL_TABLET | Freq: Every day | ORAL | Status: DC

## 2021-12-21 MED ORDER — SODIUM CHLORIDE 0.9% FLUSH: 3.0000 mL | Freq: Once | INTRAVENOUS | Status: DC

## 2021-12-21 MED ORDER — LABETALOL HCL 5 MG/ML IV SOLN
10.0000 mg | Freq: Once | INTRAVENOUS | Status: AC
  Administered 2021-12-21: 10 mg via INTRAVENOUS

## 2021-12-21 MED ORDER — IOHEXOL 350 MG/ML SOLN
75.0000 mL | Freq: Once | INTRAVENOUS | Status: AC | PRN
  Administered 2021-12-21: 75 mL via INTRAVENOUS

## 2021-12-21 MED ORDER — LORAZEPAM 2 MG/ML IJ SOLN
0.0000 mg | INTRAMUSCULAR | Status: DC
  Administered 2021-12-21 – 2021-12-23 (×4): 1 mg via INTRAVENOUS
  Filled 2021-12-21 (×5): qty 1

## 2021-12-21 MED ORDER — FOLIC ACID 1 MG PO TABS
1.0000 mg | ORAL_TABLET | Freq: Every day | ORAL | Status: DC
  Administered 2021-12-22 – 2021-12-31 (×7): 1 mg via ORAL
  Filled 2021-12-21 (×8): qty 1

## 2021-12-21 NOTE — H&P (Addendum)
Stroke History and Physical     Reason for Consult: Acute onset of right sided weakness    CC: Right sided weakness since 1631 today     HISTORY OF PRESENT ILLNESS     History of Present Illness:    Ronnie Jones is a 71 year old gentleman with a past medical history of ETOH use (at least 1 pint per day), COPD, HTN, tobacco use (2-3 packs per day), and vitamin D deficiency.  He presents via EMS as a Code Stroke after he was found at home with right sided weakness, sitting in his chair, by his wife. She had gone out to the store and returned to find him with right facial droop, right arm and leg weakness, unable to rise from chair. Called EMS. Arrived within the IV thrombolysis window. CT head reveals left subcortical ICH and TNK thus not administered.  The patient himself denies headache, dizziness, visual changes, problems with swallowing or speech, numbness or tingling of his extremities, abnormal movements, or other focal neurological deficits, despite the objective deficits noted on exam.  CTA : No LVO, no clear evidence of aneurysm near the left thalamic hemorrhage. Dense calcifications BL ICAs, L > R on author's view   Date last known well: 12/21/21 Time last known well: 1631pm  TNK Given: No, ICH    Initial NIHSS:  1a Level of Consciousness: 0 1b LOC Questions: 0 1c LOC Commands: 0 2 Best Gaze: 0 3 Visual: 0 4 Facial Palsy 1 5a Motor Arm - Left: 0 5b Motor Arm - Right: 3 6a Motor Leg - Left: 0 6b Motor Leg - Right: 4 7 Limb Ataxia: 0 8 Sensory: 0 9 Best Language: 0 10 Dysarthria: 1 11 Extinct and Inattention: 0 TOTAL: 9   Premorbid modified Rankin scale (mRS): 0 Current: at least 3  ROS: Full ROS was performed and is negative except as noted in the HPI.    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    Irritable bowel syndrome (IBS)     Murmur      Systolic... march, 2012  saw cardiologist in past  Dr. Ron Parker said it was ok 15-20 years ago no longer see's   Skin cancer     Sleep apnea      uses mouthpiece orthodontic device   Transverse myelitis ([place])      RIGHT SIDE WEAKNESS CAUSED BY PRESSURE ON THE LEFT SIDE OF SPINE ;  NEURO DID MRI , ALL STABLE     Vision abnormalities        No family history on file.      Family History  Problem Relation Age of Onset   COPD Mother     Stroke Father     Brain cancer Father        Allergies:      Allergies  Allergen Reactions   Sulfa Antibiotics Hives   Meperidine Nausea Only   Morphine Nausea Only      Social  History:  Reports that he quit smoking about 44 years ago. His smoking use included cigarettes. He has never used smokeless tobacco. He reports current alcohol use. He reports that he does not use drugs.     Medications (Not in a hospital admission)   EXAMINATION     Current vital signs:     11/19/2021    4:28 PM 11/18/2021    3:00 PM 11/18/2021    2:00 PM  Vitals with BMI  Weight 144 lbs 3 oz      BMI 0000000      Systolic [id number]      Diastolic 78      Pulse 87 79 78    Examination:  GENERAL: Awake, alert in NAD. Scent of tobacco permeates. HEENT: - Normocephalic and atraumatic, dry mm, no lymphadenopathy, no Thyromegally LUNGS - Clear to auscultation bilaterally CV - S1S2 RRR, equal pulses bilaterally. ABDOMEN - Soft, nontender, nondistended with normoactive BS Ext: warm, well perfused, intact peripheral pulses, no pedal edema   NEURO:  Mental Status: AA&Ox3 Language: speech is dysarthric.  Intact naming, fair repetition, intact fluency, and intact comprehension. Cranial Nerves:  II: PERRL. Visual fields full to confrontation.  III,  IV, VI: EOM in tact. Eyelids elevate symmetrically.  V: Sensation intact V1-3 symmetrically  VII: + R facial droop    VIII: hearing intact to voice IX, X: Palate elevates symmetrically. Phonation is normal.  [id number] shoulder shrug  XII: tongue is midline without fasciculations. Motor:  Left hemibody 5/5 Right UE: 3/5 deltoid, 4/5 elbow flex/extend, 1/5 grip strength Right LE: 3/5 hip flexion, 2/5 remainder  Tone: is normal and bulk is normal Sensation- Intact to light touch throughout DTRs: Deferred Coordination: FTN intact on the left, no ataxia in BLE., no abnormal movements  Gait- Deferred     LABS    I have reviewed labs in epic and the results pertinent to this consultation are:  No results found for: LDLCALC No results found for: HGBA1C Lab Results  Component Value Date   HGB 14.3 12/21/2021   HCT 42.0 12/21/2021   Lab Results  Component Value Date   NA 136 12/21/2021   K 4.4 12/21/2021   CL 106 12/21/2021    No results found for: ALT, AST, GGT, ALKPHOS, BILITOT   DIAGNOSTIC IMAGING/PROCEDURES    I have reviewed the images obtained:, as below     CT-head : Left thalamic/basal ganglia ICH with intraventricular extension   CTA head and neck aortic arch atherosclerosis, carotid bifurcation stenosis BL not greater than 50%, severe BL proximal vertebral artery stenosis , left dominant posterior circulation. BL calcified intracranial athero of ICAs.  Left supraclinoid infundibulum.  Fetal PCA on right   MRI brain ordered   TTE ordered   ASSESSMENT/PLAN   Assessment:  Ronnie Jones is a 71 year old gentleman with a past medical history of ETOH use (at least 1 pint per day), COPD, HTN, tobacco use (2-3 packs per day), and vitamin d deficiency who presents with right facial droop, dysarthria, and right hemiparesis. CT reveals a left thalamic ICH with intraventricular extension.    Impression: Left thalamic ICH    PLAN  ICH -Etiology: likely ETOH-induced  coagulopathy + heavy anti-platelet use (10 baby ASA per day) -ICH score 2  -Admitted to stroke service  -Repeat head CT @ 12 hours to ascertain stability of bleed. Obtain earlier if there is acute neurological change  -MRI brain without contrast to rule out CAA  -TTE -Hga1c  -  lipid profile, no statin iso ETOH abuse per his PCP  -BP goal <150 -telemetry monitoring for arrhythmia.  -NIHSS and neurology checks per protocol.  -CTH stat for any change in neurological status.  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  -duonebs prn   Tobacco use Smokes 2-3 packs per day -start with 21 mg nicotine patch, may require escalation  ETOH abuse - At least 1 pint tequila per day - Ativan PRN - Withdrawal protocol in place  DM2 -hold home metformin -SSI for BGL > 200 -POCT q 6 hours   Prophylaxis: -SCDs only, no chemical VTE  -Protonix 40mg  IV q 24 hours.  -Senna S po qhs-hold for loose stools.    Diet:  -NPO until passes and/or SLP evaluation.  -NaCl @ 75cc/hr for now -Advance diet as able.     This patient was seen and discussed with Dr. Cheral Marker.  -- Lennie Hummer, PA-C Neurology Department    **This documentation was dictated using [place] and may contain inadvertent errors **   I have seen and examined the patient. I have discussed the assessment and plan with the Neurology PA. 71 year old male presenting with acute left basal ganglia and thalamus ICH. Exam reveals right sided deficits with NIHSS of 9. Admitting to the Neurology service. Plan includes imaging to assess possible underlying etiologies, BP management and repeat CT head in 12  hours. Holding ASA.   Electronically signed: Dr. Kerney Elbe

## 2021-12-21 NOTE — ED Provider Notes (Signed)
?[facility] ?Provider Note ? ? ?CSN: [id number] ?Arrival date & time: 12/21/21  1731 ? ?An emergency department physician performed an initial assessment on this suspected stroke patient at 76. ? ?History ? ?No chief complaint on file. ? ? ?Ronnie Jones is a 71 y.o. male. ? ?HPI ?Patient presenting for strokelike symptoms.  Medical history includes hypertension, COPD, diabetes and alcoholism.  History is provided primarily by his wife and son.  Patient's wife states that he was in his normal state of health this morning.  He was sitting on the couch when she left her home.  She went to visit her son and was gone only 15 minutes.  When she returned home, he was slouched in the couch with his head rolled back.  He had severely slurred speech and facial asymmetry.  At that point, patient's wife called 3.  Patient arrives as a code stroke.  Patient drinks 1 pint of tequila every night.  His last drink was last night.  He uses albuterol inhalers at home for his COPD.  He is not currently on any anticoagulation medication.  His wife states that he does take multiple aspirin throughout each day. ?  ? ?Home Medications ?Prior to Admission medications   ?Not on File  ?   ? ?Allergies    ?Codeine   ? ?Review of Systems   ?Review of Systems  ?Neurological:  Positive for facial asymmetry, speech difficulty and weakness.  ?Psychiatric/Behavioral:  Positive for confusion.   ?All other systems reviewed and are negative. ? ?Physical Exam ?Updated Vital Signs ?BP 137/83   Pulse 78   Resp 19   Wt 77.4 kg   SpO2 96%  ?Physical Exam ?Vitals and nursing note reviewed.  ?Constitutional:   ?   General: He is not in acute distress. ?   Appearance: He is well-developed and normal weight. He is ill-appearing. He is not toxic-appearing or diaphoretic.  ?HENT:  ?   Head: Normocephalic and atraumatic.  ?   Right Ear: External ear normal.  ?   Left Ear: External ear normal.  ?   Nose: Nose normal.   ?   Mouth/Throat:  ?   Mouth: Mucous membranes are moist.  ?   Pharynx: Oropharynx is clear.  ?Eyes:  ?   General: No scleral icterus. ?   Conjunctiva/sclera: Conjunctivae normal.  ?Cardiovascular:  ?   Rate and Rhythm: Normal rate and regular rhythm.  ?   Heart sounds: No murmur heard. ?Pulmonary:  ?   Effort: Pulmonary effort is normal. No respiratory distress.  ?   Breath sounds: Wheezing present.  ?Abdominal:  ?   Palpations: Abdomen is soft.  ?   Tenderness: There is no abdominal tenderness.  ?Musculoskeletal:     ?   General: No swelling.  ?   Cervical back: Neck supple. No tenderness.  ?   Right lower leg: No edema.  ?   Left lower leg: No edema.  ?Skin: ?   General: Skin is warm and dry.  ?   Capillary Refill: Capillary refill takes less than 2 seconds.  ?   Coloration: Skin is not jaundiced or pale.  ?Neurological:  ?   Mental Status: He is alert and oriented to person, place, and time.  ?   GCS: GCS eye subscore is 4. GCS verbal subscore is 5. GCS motor subscore is 6.  ?   Cranial Nerves: Dysarthria and facial asymmetry present.  ?   Motor:  Weakness (Flaccid right upper extremity, diminished length in the right lower extremity) present.  ? ? ?ED Results / Procedures / Treatments   ?Labs ?(all labs ordered are listed, but only abnormal results are displayed) ?Labs Reviewed  ?I-STAT CHEM 8, ED - Abnormal; Notable for the following components:  ?    Result Value  ? BUN 30 (*)   ? Glucose, Bld 118 (*)   ? Calcium, Ion 1.08 (*)   ? TCO2 20 (*)   ? All other components within normal limits  ?PROTIME-INR  ?APTT  ?CBC  ?DIFFERENTIAL  ?COMPREHENSIVE METABOLIC PANEL  ?HIV ANTIBODY (ROUTINE TESTING W REFLEX)  ?LIPID PANEL  ?ETHANOL  ?HEMOGLOBIN A1C  ?RAPID URINE DRUG SCREEN, HOSP PERFORMED  ?URINALYSIS, ROUTINE W REFLEX MICROSCOPIC  ?CBG MONITORING, ED  ?TYPE AND SCREEN  ? ? ?EKG ?None ? ?Radiology ?CT HEAD CODE STROKE WO CONTRAST ? ?Result Date: 12/21/2021 ?CLINICAL DATA:  Code stroke. EXAM: CT HEAD WITHOUT CONTRAST  TECHNIQUE: Contiguous axial images were obtained from the base of the skull through the vertex without intravenous contrast. RADIATION DOSE REDUCTION: This exam was performed according to the departmental dose-optimization program which includes automated exposure control, adjustment of the mA and/or kV according to patient size and/or use of iterative reconstruction technique. COMPARISON:  None Available. FINDINGS: Brain: Hyperdense intraparenchymal hemorrhage centered in the left thalamus and medial basal ganglia, extending into the left lateral ventricle. Surrounding hypodensity, likely mild edema. Mild local mass effect. No midline shift. No evidence of acute infarction, cerebral edema, mass, mass effect, or midline shift. No hydrocephalus. Periventricular white matter changes, likely the sequela of chronic small vessel ischemic disease. Vascular: No hyperdense vessel. Atherosclerotic calcifications in the intracranial carotid and vertebral arteries. Skull: Normal. Negative for fracture or focal lesion. Sinuses/Orbits: Minimal mucosal thickening in the right maxillary sinus. The orbits are unremarkable. Other: The mastoid air cells are well aerated. ASPECTS [facility] Stroke Program Early CT Score) - Ganglionic level infarction (caudate, lentiform nuclei, internal capsule, insula, M1-M3 cortex): 7 - Supraganglionic infarction (M4-M6 cortex): 3 Total score (0-10 with 10 being normal): 10 IMPRESSION: 1. 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 Mild surrounding edema and local mass effect without significant midline shift. 2. ASPECTS is 10 Code stroke imaging results were communicated on 12/21/2021 at 5:51 pm to provider Dr. Cheral Marker via secure text paging. Electronically Signed   By: Merilyn Baba M.D.   On: 12/21/2021 17:54   ? ?Procedures ?Procedures  ? ? ?Medications Ordered in  ED ?Medications  ?sodium chloride flush (NS) 0.9 % injection 3 mL (has no administration in time range)  ?labetalol (NORMODYNE) injection 10 mg (10 mg Intravenous Given 12/21/21 1746)  ?  And  ?clevidipine (CLEVIPREX) infusion 0.5 mg/mL (2 mg/hr Intravenous New Bag/Given 12/21/21 1752)  ? stroke: early stages of recovery book (has no administration in time range)  ?acetaminophen (TYLENOL) tablet 650 mg (has no administration in time range)  ?  Or  ?acetaminophen (TYLENOL) 160 MG/5ML solution 650 mg (has no administration in time range)  ?  Or  ?acetaminophen (TYLENOL) suppository 650 mg (has no administration in time range)  ?senna-docusate (Senokot-S) tablet 1 tablet (has no administration in time range)  ?pantoprazole (PROTONIX) injection 40 mg (has no administration in time range)  ?0.9 %  sodium chloride infusion (has no administration in time range)  ?iohexol (OMNIPAQUE) 350 MG/ML injection 75 mL (75 mLs Intravenous Contrast Given 12/21/21  1810)  ? ? ?ED Course/ Medical Decision Making/ A&P ?  ?                        ?Medical Decision Making ?Risk ?Decision regarding hospitalization. ? ? ?This patient presents to the ED for concern of strokelike symptoms, this involves an extensive number of treatment options, and is a complaint that carries with it a high risk of complications and morbidity.  The differential diagnosis includes CVA, hemorrhagic stroke, hypoglycemia, intoxication ? ? ?Co morbidities that complicate the patient evaluation ? ?Hypertension, COPD, diabetes, alcoholism ? ? ?Additional history obtained: ? ?Additional history obtained from patient's wife and son ?External records from outside source obtained and reviewed including N/A ? ? ?Lab Tests: ? ?I Ordered, and personally interpreted labs.  The pertinent results include: Normal hemoglobin, no leukocytosis, normal platelet count, normal electrolytes, normal INR; remaining lab work pending at time of admission ? ?Imaging Studies ordered: ? ?I  ordered imaging studies including CT head ?I independently visualized and interpreted imaging which showed intraparenchymal hemorrhage centered in the left thalamus with extension into left lateral ventricle.  Mild localiz

## 2021-12-21 NOTE — ED Notes (Signed)
Patient wheezing. Having bouts of coughing, unable to produce sputum. Dr. Otelia Limes informed, RT in to evaluate.  ?

## 2021-12-21 NOTE — ED Triage Notes (Signed)
"  I left home and he was fine. I went to pick up food and was gone about 15 minutes and when I came back he was still sitting on the couch but was slumped down and could not get up, I couldn't understand his speech and his right arm and leg was weak. He does drink a pint of tequilla starting at 4pm every day. He hadn't started today yet" per spouse.  ?

## 2021-12-21 NOTE — Code Documentation (Signed)
Stroke Response Nurse Documentation ?Code Documentation ? ?Ronnie Jones is a 71 y.o. male arriving to [facility]  via [facility] on 12/21/2021 with past medical hx of hypertension, COPD, alcohol abuse (drinks 1 pint of tequila every night) and diabetes.  ? ?Patient from home where he was LKW at 1630. Wife later found patient slouched over on couch. She also noted slurred speech and facial droop. Code stroke was activated by EMS.  ? ?Stroke team at the bedside on patient arrival. Labs drawn and patient cleared for CT by Dr. Wyvonnia Dusky. Patient to CT with team. NIHSS 11, see documentation for details and code stroke times. Patient with right facial droop, right arm weakness, right leg weakness, and dysarthria  on exam. The following imaging was completed:  CT Head and CTA. Patient is not a candidate for IV Thrombolytic due to [place].  ? ?Care Plan: Q1 assessments; admit to ICU.  ? ?Bedside handoff with ED RN and ICU RN.   ? ?Earma Reading  ?Stroke Response RN ? ? ?

## 2021-12-22 ENCOUNTER — Inpatient Hospital Stay (HOSPITAL_COMMUNITY): Payer: Medicare HMO

## 2021-12-22 DIAGNOSIS — I161 Hypertensive emergency: Secondary | ICD-10-CM

## 2021-12-22 DIAGNOSIS — I6389 Other cerebral infarction: Secondary | ICD-10-CM

## 2021-12-22 DIAGNOSIS — I615 Nontraumatic intracerebral hemorrhage, intraventricular: Secondary | ICD-10-CM

## 2021-12-22 DIAGNOSIS — I610 Nontraumatic intracerebral hemorrhage in hemisphere, subcortical: Secondary | ICD-10-CM | POA: Diagnosis not present

## 2021-12-22 DIAGNOSIS — F17200 Nicotine dependence, unspecified, uncomplicated: Secondary | ICD-10-CM | POA: Diagnosis not present

## 2021-12-22 DIAGNOSIS — F1010 Alcohol abuse, uncomplicated: Secondary | ICD-10-CM

## 2021-12-22 LAB — ECHOCARDIOGRAM COMPLETE
Area-P 1/2: 3.27 cm2
Calc EF: 65.1 %
Height: 67 in
S' Lateral: 2.4 cm
Single Plane A2C EF: 59.2 %
Single Plane A4C EF: 68.5 %
Weight: 2720 oz

## 2021-12-22 LAB — RAPID URINE DRUG SCREEN, HOSP PERFORMED
Amphetamines: NOT DETECTED
Barbiturates: NOT DETECTED
Benzodiazepines: NOT DETECTED
Cocaine: NOT DETECTED
Opiates: NOT DETECTED
Tetrahydrocannabinol: NOT DETECTED

## 2021-12-22 LAB — GLUCOSE, CAPILLARY
Glucose-Capillary: 141 mg/dL — ABNORMAL HIGH (ref 70–99)
Glucose-Capillary: 145 mg/dL — ABNORMAL HIGH (ref 70–99)
Glucose-Capillary: 162 mg/dL — ABNORMAL HIGH (ref 70–99)
Glucose-Capillary: 112 mg/dL — ABNORMAL HIGH (ref 70–99)
Glucose-Capillary: 144 mg/dL — ABNORMAL HIGH (ref 70–99)
Glucose-Capillary: 149 mg/dL — ABNORMAL HIGH (ref 70–99)

## 2021-12-22 LAB — URINALYSIS, MICROSCOPIC (REFLEX)

## 2021-12-22 LAB — URINALYSIS, ROUTINE W REFLEX MICROSCOPIC
Bilirubin Urine: NEGATIVE
Glucose, UA: 250 mg/dL — AB
Ketones, ur: NEGATIVE mg/dL
Leukocytes,Ua: NEGATIVE
Nitrite: NEGATIVE
Protein, ur: >300 mg/dL — AB
Specific Gravity, Urine: 1.015 (ref 1.005–1.030)
pH: 5.5 (ref 5.0–8.0)

## 2021-12-22 IMAGING — DX DG CHEST 1V PORT
1 series · 1 of 1 positions shown · non-contrast
Comparison: [DATE].

CLINICAL DATA: Respiratory abnormalities.

EXAM:
PORTABLE CHEST 1 VIEW

[chest]
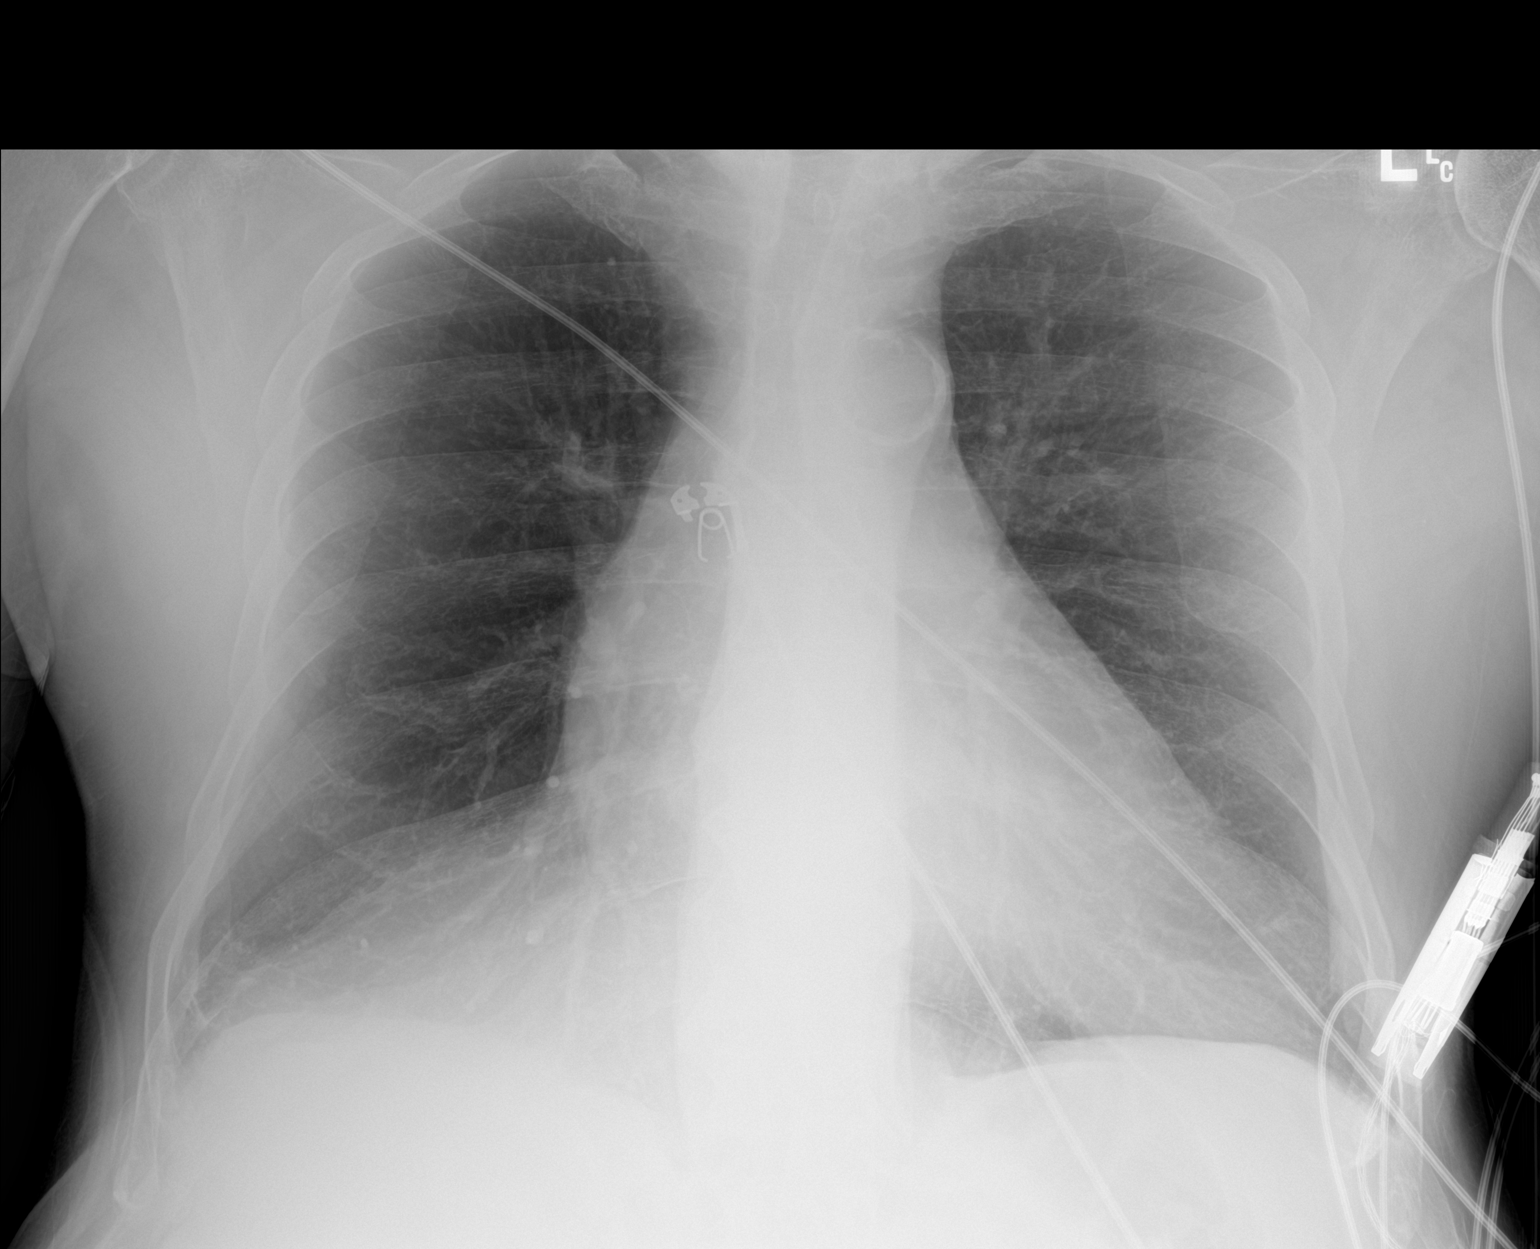

[1 of 1 positions shown; findings below may reference images not displayed]

FINDINGS: Enlarged cardiac silhouette, probably accentuated by AP technique.
No consolidation. No visible pleural effusions or pneumothorax. No
displaced fracture.
IMPRESSION: No evidence of acute cardiopulmonary disease.

## 2021-12-22 MED ORDER — FUROSEMIDE 10 MG/ML IJ SOLN
40.0000 mg | Freq: Once | INTRAMUSCULAR | Status: AC
  Administered 2021-12-22: 40 mg via INTRAVENOUS
  Filled 2021-12-22: qty 4

## 2021-12-22 MED ORDER — LISINOPRIL 20 MG PO TABS
20.0000 mg | ORAL_TABLET | Freq: Two times a day (BID) | ORAL | Status: DC
Start: 2021-12-22 — End: 2021-12-23
  Filled 2021-12-22 (×2): qty 1

## 2021-12-22 MED ORDER — AMLODIPINE BESYLATE 10 MG PO TABS
10.0000 mg | ORAL_TABLET | Freq: Every day | ORAL | Status: DC
  Administered 2021-12-22 – 2021-12-31 (×8): 10 mg via ORAL
  Filled 2021-12-22 (×9): qty 1

## 2021-12-22 MED ORDER — HYDRALAZINE HCL 50 MG PO TABS
50.0000 mg | ORAL_TABLET | Freq: Three times a day (TID) | ORAL | Status: DC
  Administered 2021-12-22 – 2021-12-23 (×2): 50 mg via ORAL
  Filled 2021-12-22 (×4): qty 1

## 2021-12-22 MED ORDER — INSULIN ASPART 100 UNIT/ML IJ SOLN
0.0000 [IU] | Freq: Three times a day (TID) | INTRAMUSCULAR | Status: DC
  Administered 2021-12-22 – 2021-12-25 (×7): 2 [IU] via SUBCUTANEOUS
  Administered 2021-12-26 (×2): 0 [IU] via SUBCUTANEOUS
  Administered 2021-12-28: 2 [IU] via SUBCUTANEOUS
  Administered 2021-12-29: 0 [IU] via SUBCUTANEOUS

## 2021-12-22 MED ORDER — CHLORHEXIDINE GLUCONATE CLOTH 2 % EX PADS
6.0000 | MEDICATED_PAD | Freq: Every day | CUTANEOUS | Status: DC
  Administered 2021-12-22 – 2021-12-31 (×6): 6 via TOPICAL

## 2021-12-22 MED ORDER — ATENOLOL 25 MG PO TABS
50.0000 mg | ORAL_TABLET | Freq: Every day | ORAL | Status: DC
  Administered 2021-12-22 – 2021-12-31 (×8): 50 mg via ORAL
  Filled 2021-12-22: qty 2
  Filled 2021-12-22: qty 1
  Filled 2021-12-22: qty 2
  Filled 2021-12-22: qty 1
  Filled 2021-12-22 (×4): qty 2
  Filled 2021-12-22: qty 1

## 2021-12-22 MED ORDER — LISINOPRIL 20 MG PO TABS
20.0000 mg | ORAL_TABLET | Freq: Every day | ORAL | Status: DC
  Administered 2021-12-22: 20 mg via ORAL
  Filled 2021-12-22: qty 1

## 2021-12-22 MED ORDER — ALBUTEROL SULFATE (2.5 MG/3ML) 0.083% IN NEBU
2.5000 mg | INHALATION_SOLUTION | RESPIRATORY_TRACT | Status: DC | PRN
  Administered 2021-12-22 – 2021-12-26 (×7): 2.5 mg via RESPIRATORY_TRACT
  Filled 2021-12-22 (×7): qty 3

## 2021-12-22 MED ORDER — LABETALOL HCL 5 MG/ML IV SOLN
10.0000 mg | INTRAVENOUS | Status: DC | PRN
  Administered 2021-12-28: 20 mg via INTRAVENOUS
  Filled 2021-12-22: qty 4

## 2021-12-22 MED ORDER — PANTOPRAZOLE SODIUM 40 MG PO TBEC
40.0000 mg | DELAYED_RELEASE_TABLET | Freq: Every day | ORAL | Status: DC
Start: 2021-12-22 — End: 2021-12-31
  Administered 2021-12-22 – 2021-12-31 (×7): 40 mg via ORAL
  Filled 2021-12-22 (×7): qty 1

## 2021-12-22 MED ORDER — ENSURE ENLIVE PO LIQD
237.0000 mL | Freq: Three times a day (TID) | ORAL | Status: DC
  Administered 2021-12-22: 237 mL via ORAL

## 2021-12-22 MED ORDER — LABETALOL HCL 5 MG/ML IV SOLN: 10.0000 mg | INTRAVENOUS | Status: DC | PRN

## 2021-12-22 NOTE — Progress Notes (Signed)
Attempted to scan, pt unable to cooperate and was grabbing at head coil and other parts of scanner. RN observed and agreed to speak with MD.  ?

## 2021-12-22 NOTE — Progress Notes (Signed)
OT Cancellation Note ? ?Patient Details ?Name: Ronnie Jones ?MRN: [id number] ?DOB: 1951/01/22 ? ? ?Cancelled Treatment:    Reason Eval/Treat Not Completed: Active bedrest order (OT evaluation to f/u as activity orders progress) ? ?Ronnie Jones ?12/22/2021, 7:55 AM ?

## 2021-12-22 NOTE — Progress Notes (Signed)
?  Transition of Care (TOC) Screening Note ? ? ?Patient Details  ?Name: Ronnie Jones ?Date of Birth: 1951-01-19 ? ? ?Transition of Care [facility]) CM/SW Contact:    ?Glennon Mac, RN ?Phone Number: ?12/22/2021, 3:26 PM ? ? ? ?Transition of Care Department [facility]) has reviewed patient and no TOC needs have been identified at this time. We will continue to monitor patient advancement through interdisciplinary progression rounds. If new patient transition needs arise, please place a TOC consult. ? ?PT/OT evaluations pending; will continue to follow.  ? ?Quintella Baton, RN, BSN  ?Trauma/Neuro ICU Case Manager ?[phone]  ? ?

## 2021-12-22 NOTE — Evaluation (Signed)
Occupational Therapy Evaluation ?Patient Details ?Name: Ronnie Jones ?MRN: [id number] ?DOB: Jul 06, 1951 ?Today's Date: 12/22/2021 ? ? ?History of Present Illness Ronnie Jones is a 71 year old gentleman who presented via EMS as a Code Stroke after he was found at home with right sided weakness, sitting in his chair, by his wife. CT showed L subcortical ICH. PMH:  ETOH use (at least 1 pint per day), COPD, HTN, tobacco use (2-3 packs per day), and vitamin D deficiency.  ? ?Clinical Impression ?  ?Ronnie Jones was evaluated s/p the above admission list, he is indep at baseline including working. He lives in a 1 level home with his wife who can be home 24/7. Upon evaluation pt was limited by L hemiplegia, impaired cognition, poor balance and trunk control, expressive communication difficulties, and poor activity tolerance for sitting EOB. Overall he required total A +2 fro bed mobility and unable to successfully stand this date despite 3x attempts. Due to limitation he requires mod A for UB ADLs and max-total A +2 fro LB ADLs. He will benefit from OT acutely. Recommend d.c to AIR for maximal functional return.  ?   ? ?Recommendations for follow up therapy are one component of a multi-disciplinary discharge planning process, led by the attending physician.  Recommendations may be updated based on patient status, additional functional criteria and insurance authorization.  ? ?Follow Up Recommendations ? Acute inpatient rehab (3hours/day)  ?  ?Assistance Recommended at Discharge Frequent or constant Supervision/Assistance  ?Patient can return home with the following A lot of help with walking and/or transfers;A lot of help with bathing/dressing/bathroom;Assistance with cooking/housework;Assistance with feeding;Direct supervision/assist for medications management;Direct supervision/assist for financial management;Assist for transportation;Help with stairs or ramp for entrance ? ?  ?Functional Status Assessment ? Patient has had a  recent decline in their functional status and demonstrates the ability to make significant improvements in function in a reasonable and predictable amount of time.  ?Equipment Recommendations ? Other (comment) (pending progression)  ?  ?Recommendations for Other Services Rehab consult ? ? ?  ?Precautions / Restrictions Precautions ?Precautions: Fall ?Precaution Comments: watch O2 ?Restrictions ?Weight Bearing Restrictions: No  ? ?  ? ?Mobility Bed Mobility ?Overal bed mobility: Needs Assistance ?Bed Mobility: Supine to Sit, Sit to Supine ?  ?  ?Supine to sit: Max assist, +2 for physical assistance ?Sit to supine: Total assist, +2 for physical assistance ?  ?General bed mobility comments: pt unable to initiate roll to L due to flaccid R side but was able to push with L elbow to come to sit but still needed max A +2 for LE's off bed and elevation of trunk away from bed. Tot A for return to supine ?  ? ?Transfers ?Overall transfer level: Needs assistance ?Equipment used: 2 person hand held assist ?Transfers: Sit to/from Stand ?Sit to Stand: Max assist, +2 physical assistance ?  ?  ?  ?  ?  ?General transfer comment: attempted sit>stand 2x but pt with heavy R lean and collapse towards that side as well as difficulty wt shifting forward. Was unable to clear buttocks from bed with max A +2 ?  ? ?  ?Balance Overall balance assessment: Needs assistance ?Sitting-balance support: Feet supported, Single extremity supported ?Sitting balance-Leahy Scale: Poor ?Sitting balance - Comments: R lean, needed mod A to maintain sitting. Was able to lean L when cued ?Postural control: Posterior lean, Right lateral lean ?Standing balance support: Bilateral upper extremity supported, During functional activity ?Standing balance-Leahy Scale: Zero ?  ?  ?  ?  ?  ?  ?  ?  ?  ?  ?  ?  ?   ? ?  ADL either performed or assessed with clinical judgement  ? ?ADL Overall ADL's : Needs assistance/impaired ?Eating/Feeding: NPO ?  ?Grooming: Moderate  assistance;Sitting ?  ?Upper Body Bathing: Moderate assistance;Sitting ?  ?Lower Body Bathing: Maximal assistance;+2 for physical assistance;+2 for safety/equipment ?  ?Upper Body Dressing : Moderate assistance;Sitting ?  ?Lower Body Dressing: Maximal assistance;+2 for physical assistance;+2 for safety/equipment ?  ?Toilet Transfer: Total assistance ?  ?Toileting- Clothing Manipulation and Hygiene: Total assistance ?  ?  ?  ?Functional mobility during ADLs: Maximal assistance;+2 for physical assistance;+2 for safety/equipment ?General ADL Comments: R hemi, poor sitting balance, difficulty breathing, impaired cognition  ? ? ? ?Vision Baseline Vision/History: 0 No visual deficits ?Patient Visual Report: No change from baseline ?Vision Assessment?: Vision impaired- to be further tested in functional context ?Additional Comments: difficulty attending to stimulus in L visual field  ?   ?Perception   ?  ?Praxis   ?  ? ?Pertinent Vitals/Pain Pain Assessment ?Pain Assessment: No/denies pain  ? ? ? ?Hand Dominance Right ?  ?Extremity/Trunk Assessment Upper Extremity Assessment ?Upper Extremity Assessment: RUE deficits/detail ?RUE Deficits / Details: flaccid ?RUE Sensation: decreased light touch;decreased proprioception ?RUE Coordination: decreased fine motor;decreased gross motor ?  ?Lower Extremity Assessment ?Lower Extremity Assessment: Defer to PT evaluation ?RLE Deficits / Details: hip flex 1+/5, knee ext 1+/5 ?RLE Sensation: decreased proprioception ?RLE Coordination: decreased fine motor;decreased gross motor ?  ?Cervical / Trunk Assessment ?Cervical / Trunk Assessment: Kyphotic ?  ?Communication Communication ?Communication: Expressive difficulties ?  ?Cognition Arousal/Alertness: Awake/alert ?Behavior During Therapy: Flat affect ?Overall Cognitive Status: Impaired/Different from baseline ?Area of Impairment: Orientation, Attention, Memory, Following commands, Safety/judgement, Awareness, Problem solving ?  ?  ?  ?  ?   ?  ?  ?  ?Orientation Level: Disoriented to, Situation, Time, Place ?Current Attention Level: Sustained ?Memory: Decreased short-term memory ?Following Commands: Follows one step commands inconsistently ?Safety/Judgement: Decreased awareness of safety, Decreased awareness of deficits ?Awareness: Intellectual ?Problem Solving: Slow processing, Decreased initiation, Difficulty sequencing, Requires verbal cues, Requires tactile cues ?General Comments: pt unable to recall where he is but he does mention that his wife told him earlier this day. Complicated by slurred speech. ?  ?  ?General Comments  Pt with audible wheezing sound while breathing. SPO2 maintained low 90's on RW. Pt reports that this is his normal and he sometimes uses an inhaler ? ?  ?Exercises   ?  ?Shoulder Instructions    ? ? ?Home Living Family/patient expects to be discharged to:: Private residence ?Living Arrangements: Spouse/significant other ?Available Help at Discharge: Available 24 hours/day ?Type of Home: House ?Home Access: Stairs to enter ?Entrance Stairs-Number of Steps: 2 ?Entrance Stairs-Rails: Right;Left;Can reach both ?Home Layout: One level ?  ?  ?Bathroom Shower/Tub: Tub/shower unit;Walk-in shower ?  ?Bathroom Toilet: Standard ?  ?  ?Home Equipment: Wheelchair - Publishing copy (2 wheels) ?  ?  ? Lives With: Spouse ? ?  ?Prior Functioning/Environment Prior Level of Function : Independent/Modified Independent;Driving;Working/employed ?  ?  ?  ?  ?  ?  ?Mobility Comments: no AD ?ADLs Comments: lays brick for work ?  ? ?  ?  ?OT Problem List: Decreased strength;Decreased range of motion;Decreased activity tolerance;Impaired balance (sitting and/or standing);Cardiopulmonary status limiting activity;Decreased safety awareness;Decreased knowledge of use of DME or AE;Decreased knowledge of precautions;Pain;Impaired UE functional use ?  ?   ?OT Treatment/Interventions: Self-care/ADL training;Therapeutic exercise;DME and/or AE  instruction;Therapeutic activities;Visual/perceptual remediation/compensation;Balance training;Patient/family education;Neuromuscular education  ?  ?OT Goals(Current goals can be  found in the care plan section) Acu

## 2021-12-22 NOTE — Progress Notes (Addendum)
STROKE TEAM PROGRESS NOTE  ? ?INTERVAL HISTORY ?His wife and son are at the bedside.  Passed SLP- now on dysphagia 3 diet. Home norvasc and lisinopril resumed. Cleviprex infusing. MRI scheduled for later today ? ?Vitals:  ? 12/22/21 0600 12/22/21 0700 12/22/21 0730 12/22/21 0800  ?BP: 140/74 129/77 (!) 141/84   ?Pulse: 81 82 84   ?Resp: 20 20 20    ?Temp:    98.5 ?F (36.9 ?C)  ?TempSrc:    Axillary  ?SpO2: 90% (!) 89% 93%   ?Weight:      ?Height:      ? ?CBC:  ?Recent Labs  ?Lab 12/21/21 ?1737 12/21/21 ?1745  ?WBC 6.8  --   ?NEUTROABS 4.6  --   ?HGB 14.8 14.3  ?HCT 43.9 42.0  ?MCV 89.0  --   ?PLT 286  --   ? ?Basic Metabolic Panel:  ?Recent Labs  ?Lab 12/21/21 ?1737 12/21/21 ?1745  ?NA 135 136  ?K 4.8 4.4  ?CL 103 106  ?CO2 22  --   ?GLUCOSE 129* 118*  ?BUN 32* 30*  ?CREATININE 1.36* 1.20  ?CALCIUM 8.9  --   ? ?Lipid Panel:  ?Recent Labs  ?Lab 12/21/21 ?1737  ?CHOL 321*  ?TRIG 339*  ?HDL 61  ?CHOLHDL 5.3  ?VLDL 68*  ?[place] 192*  ? ?HgbA1c:  ?Recent Labs  ?Lab 12/21/21 ?1737  ?HGBA1C 6.1*  ? ?Urine Drug Screen: No results for input(s): LABOPIA, COCAINSCRNUR, LABBENZ, AMPHETMU, THCU, LABBARB in the last 168 hours.  ?Alcohol Level  ?Recent Labs  ?Lab 12/21/21 ?2032  ?ETH <10  ? ? ?IMAGING past 24 hours ?CT HEAD WO CONTRAST (5MM) ? ?Result Date: 12/21/2021 ?CLINICAL DATA:  Right thalamic bleed.  Follow-up. EXAM: CT HEAD WITHOUT CONTRAST TECHNIQUE: Contiguous axial images were obtained from the base of the skull through the vertex without intravenous contrast. RADIATION DOSE REDUCTION: This exam was performed according to the departmental dose-optimization program which includes automated exposure control, adjustment of the mA and/or kV according to patient size and/or use of iterative reconstruction technique. COMPARISON:  CT earlier today at 17:52 FINDINGS: Brain: Hyperdense left thalamocapsular bleed is again noted, measuring 3.0 x 3.0 cm AP and transverse 3.6 cm in height. There were similar AP and transverse axis  measurements earlier today but interval slight increase in height with the bleed measuring 3.3 cm height previously. There is intraventricular hemorrhagic extension with hyperdense hemorrhage slightly increased in the posterior horns of both lateral ventricles in the interval. This is more so on the left. There is background moderately developed atrophy, small vessel disease and atrophic ventriculomegaly with tiny chronic lacunar infarcts in both basal ganglia. No new hemorrhage, cortical based infarct or mass are seen. There is 3 mm of midline shift to the right of the septum pellucidum with only mild local mass effect around the hemorrhage with small margin of vasogenic edema. Vascular: The carotid siphons and distal vertebral arteries are heavily calcified. There is contrast in the vasculature from today's earlier CTA head and neck. No large vessel occlusion is seen with nondedicated technique. Skull: No skull fracture or focal lesion. Sinuses/Orbits: There is mild membrane thickening in the frontal and ethmoid sinuses and right maxillary sinus. No fluid levels. Other: Minimal fluid is again noted in the inferior mastoid air cells on both sides. IMPRESSION: 1. 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 2. 3 mm left-to-right midline shift, with slight increased intraventricular hemorrhagic content than  previously. 3. Background chronic changes described above. 4. Sinus disease, minimal mastoid effusions. 5. Carotid and vertebral artery atherosclerosis. Electronically Signed   By: Telford Nab M.D.   On: 12/21/2021 23:10  ? ?CT HEAD CODE STROKE WO CONTRAST ? ?Result Date: 12/21/2021 ?CLINICAL DATA:  Code stroke. EXAM: CT HEAD WITHOUT CONTRAST TECHNIQUE: Contiguous axial images were obtained from the base of the skull through the vertex without intravenous contrast. RADIATION DOSE REDUCTION: This exam was performed according to  the departmental dose-optimization program which includes automated exposure control, adjustment of the mA and/or kV according to patient size and/or use of iterative reconstruction technique. COMPARISON:  None Available. FINDINGS: Brain: Hyperdense intraparenchymal hemorrhage centered in the left thalamus and medial basal ganglia, extending into the left lateral ventricle. Surrounding hypodensity, likely mild edema. Mild local mass effect. No midline shift. No evidence of acute infarction, cerebral edema, mass, mass effect, or midline shift. No hydrocephalus. Periventricular white matter changes, likely the sequela of chronic small vessel ischemic disease. Vascular: No hyperdense vessel. Atherosclerotic calcifications in the intracranial carotid and vertebral arteries. Skull: Normal. Negative for fracture or focal lesion. Sinuses/Orbits: Minimal mucosal thickening in the right maxillary sinus. The orbits are unremarkable. Other: The mastoid air cells are well aerated. ASPECTS [facility] Stroke Program Early CT Score) - Ganglionic level infarction (caudate, lentiform nuclei, internal capsule, insula, M1-M3 cortex): 7 - Supraganglionic infarction (M4-M6 cortex): 3 Total score (0-10 with 10 being normal): 10 IMPRESSION: 1. 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 Mild surrounding edema and local mass effect without significant midline shift. 2. ASPECTS is 10 Code stroke imaging results were communicated on 12/21/2021 at 5:51 pm to provider Dr. Cheral Marker via secure text paging. Electronically Signed   By: Merilyn Baba M.D.   On: 12/21/2021 17:54  ? ?CT ANGIO HEAD NECK W WO CM (CODE STROKE) ? ?Result Date: 12/21/2021 ?CLINICAL DATA:  Code stroke. EXAM: CT ANGIOGRAPHY HEAD AND NECK TECHNIQUE: Multidetector CT imaging of the head and neck was performed using the standard protocol during bolus  administration of intravenous contrast. Multiplanar CT image reconstructions and MIPs were obtained to evaluate the vascular anatomy. Carotid stenosis measurements (when applicable) are obtained utilizing NASCET criteria, using the distal internal carotid diameter as the denominator. RADIATION DOSE REDUCTION: This exam was performed according to the departmental dose-optimization program which includes automated exposure control, adjustment of the mA and/or kV according to patient size and/or use of iterative reconstruction technique. CONTRAST:  44mL OMNIPAQUE IOHEXOL 350 MG/ML SOLN COMPARISON:  CT head from the same day. FINDINGS: CTA NECK FINDINGS Aortic arch: Aortic atherosclerosis. Great vessel origins are patent without significant stenosis. Air where it origin of the subclavian artery on the right, which courses behind the esophagus and trachea. Right carotid system: Atherosclerosis at the carotid bifurcation without greater than 50% stenosis. Left carotid system: Atherosclerosis at the carotid bifurcation and involving the proximal ICA without greater than 50% stenosis. Vertebral arteries: Severe stenosis of bilateral proximal vertebral arteries. Moderate stenosis of the more distal right V2 vertebral artery. Left dominant. Skeleton: No acute findings. Other neck: No acute findings. Upper chest: Emphysema. Review of the MIP images confirms the above findings CTA HEAD FINDINGS Anterior circulation: Calcific atherosclerosis of bilateral intracranial ICAs. The calcific nature of plaque limits accurate quantification of the stenosis, which is approximately mild-to-moderate. Bilateral MCAs and ACAs are patent. Small infundibulum arising from the left supraclinoid ICA posteriorly. Posterior circulation: Left  dominant vertebral artery. Severe stenosis of the right vertebral artery at its dural margin. Moderate stenosis of the proximal left intradural vertebral artery. Basilar artery and posterior cerebral arteries  are patent without proximal high-grade stenosis. Right fetal type PCA, anatomic variant. Venous sinuses: As permitted by contrast timing, patent. Anatomic variants: Detailed above. Review of the MIP images confirm

## 2021-12-22 NOTE — Progress Notes (Signed)
? ?  Inpatient Rehab Admissions Coordinator : ? ?Per therapy recommendations, patient was screened for CIR candidacy by Weylyn Ricciuti RN MSN.  At this time patient appears to be a potential candidate for CIR. I will place a rehab consult per protocol for full assessment. Please call me with any questions. ? ?Leyah Bocchino RN MSN ?Admissions Coordinator ?[phone] ?  ?

## 2021-12-22 NOTE — Evaluation (Signed)
Clinical/Bedside Swallow Evaluation ?Patient Details  ?Name: Ronnie Jones ?MRN: [id number] ?Date of Birth: 04-13-1951 ? ?Today's Date: 12/22/2021 ?Time: SLP Start Time (ACUTE ONLY): 1028 SLP Stop Time (ACUTE ONLY): 1037 ?SLP Time Calculation (min) (ACUTE ONLY): 9 min ? ?Past Medical History:  ?Past Medical History:  ?Diagnosis Date  ? CKD (chronic kidney disease)   ? COPD (chronic obstructive pulmonary disease) ([place])   ? Hypertension   ? ?Past Surgical History: History reviewed. No pertinent surgical history. ?HPI:  ?Ronnie Jones is a 71 year old gentleman who presented via EMS as a Code Stroke after he was found at home with right sided weakness, sitting in his chair, by his wife. She had gone out to the store and returned to find him with right facial droop, right arm and leg weakness, unable to rise from chair. Head CT 5/15: "1. 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  2. 3 mm left-to-right midline shift, with slight increased  intraventricular hemorrhagic content than previously."  Pt with a past medical history of ETOH use (at least 1 pint per day), COPD, HTN, tobacco use (2-3 packs per day), and vitamin D deficiency.  ?  ?Assessment / Plan / Recommendation  ?Clinical Impression ? Pt presents with a mild oral dysphagia c/b R sided oral weakness and likely also impacted by partial edentulism.  Pt declined placement of dentures for today's assessment, but states that he usually wears them when he eats.  Per RN there was anterior loss on R side with cup sip on swallow screen.  Pt tolerated straw sips of thin liquid without anterior loss and without clinical s/s of aspiration.  Pt tolerated puree and solids textures without overt s/s of aspiration.  There was oral residue with solids which cleared with liquid wash.  Oral residuals were reduced with soft solids.  There was on delayed throat clear following cessation of PO  trials. Pt with baseline COPD and wheezing breath sounds on arrival.  Suspect throat clear was not related to PO intake, but if pt exhibits difficulty with respiration following POs, please make pt NPO and reconsult speech therapy. ? ?Recommend mechanical soft diet with thin liquids by straw with aspiration precautions as noted below. ? ?SLP Visit Diagnosis: Cognitive communication deficit (R41.841);Dysarthria and anarthria (R47.1) ?   ?Aspiration Risk ? Mild aspiration risk  ?  ?Diet Recommendation Dysphagia 3 (Mech soft);Thin liquid  ? ?Liquid Administration via: Straw ? ?Medication Administration:  As tolerated.  Consider whole with puree if pt has difficulty orally manipulating pills. ? ?Supervision: Staff to assist with self feeding ? ?Compensations:  ?Slow rate; ?Small sips/bites; ?Follow solids with liquid to reduce oral residue ?Liquid by straw to prevent anterior spillage ? ?Postural Changes: Seated upright at 90 degrees  ?  ?Other  Recommendations Oral Care Recommendations: Oral care BID   ? ?Recommendations for follow up therapy are one component of a multi-disciplinary discharge planning process, led by the attending physician.  Recommendations may be updated based on patient status, additional functional criteria and insurance authorization. ? ?Follow up Recommendations Acute inpatient rehab (3hours/day)  ? ? ?  ?Assistance Recommended at Discharge Frequent or constant Supervision/Assistance  ?Functional Status Assessment Patient has had a recent decline in their functional status and demonstrates the ability to make significant improvements in function in a reasonable and predictable amount of time.  ?Frequency and Duration min 2x/week  ?2 weeks ?  ?   ? ?  Prognosis Prognosis for Safe Diet Advancement: Good  ? ?  ? ?Swallow Study   ?General Date of Onset: 11/21/21 ?HPI: Ronnie Jones is a 71 year old gentleman who presented via EMS as a Code Stroke after he was found at home with right sided weakness,  sitting in his chair, by his wife. She had gone out to the store and returned to find him with right facial droop, right arm and leg weakness, unable to rise from chair. Head CT 5/15: "1. 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  2. 3 mm left-to-right midline shift, with slight increased  intraventricular hemorrhagic content than previously."  Pt with a past medical history of ETOH use (at least 1 pint per day), COPD, HTN, tobacco use (2-3 packs per day), and vitamin D deficiency. ?Type of Study: Bedside Swallow Evaluation ?Previous Swallow Assessment: None ?Diet Prior to this Study: NPO ?Temperature Spikes Noted: No ?Respiratory Status: Room air ?History of Recent Intubation: No ?Behavior/Cognition: Alert;Cooperative;Pleasant mood ?Oral Cavity Assessment: Within Functional Limits ?Oral Care Completed by SLP: No ?Oral Cavity - Dentition:  (Edentulous top arch; pt declined denture placement) ?Self-Feeding Abilities: Needs assist ?Patient Positioning: Upright in bed ?Baseline Vocal Quality: Normal ?Volitional Cough: Strong ?Volitional Swallow: Able to elicit  ?  ?Oral/Motor/Sensory Function Overall Oral Motor/Sensory Function: Mild impairment ?Facial ROM: Reduced right ?Facial Symmetry: Abnormal symmetry right ?Facial Strength: Reduced right ?Lingual ROM: Within Functional Limits ?Lingual Symmetry: Abnormal symmetry right ?Lingual Strength: Within Functional Limits ?Velum: Within Functional Limits ?Mandible: Within Functional Limits   ?Ice Chips Ice chips: Not tested   ?Thin Liquid Thin Liquid: Within functional limits ?Presentation: Straw  ?  ?Nectar Thick Nectar Thick Liquid: Not tested   ?Honey Thick Honey Thick Liquid: Not tested   ?Puree Puree: Within functional limits ?Presentation: Spoon   ?Solid ? ? ?  Solid: Impaired ?Oral Phase Functional Implications: Oral residue  ? ?  ? ?Ronnie Jones E Annaleigha Woo, MA, CCC-SLP ?Acute  Rehabilitation Services ?Office: [phone] ?12/22/2021,10:50 AM ? ? ? ?

## 2021-12-22 NOTE — Evaluation (Signed)
Physical Therapy Evaluation Patient Details Name: Ronnie Jones MRN: [id number] DOB: 02/22/1951 Today's Date: 12/22/2021  History of Present Illness  Ronnie Jones is a 71 year old gentleman who presented via EMS as a Code Stroke after he was found at home with right sided weakness, sitting in his chair, by his wife. CT showed L subcortical ICH. PMH:  ETOH use (at least 1 pint per day), COPD, HTN, tobacco use (2-3 packs per day), and vitamin D deficiency.  Clinical Impression  Pt admitted with above diagnosis. 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 Pt with AMS but somewhat difficult to fully assess due to aphasia/ slurred speech. Pt present with flaccidity RUE/ RLE with decreased awareness of deficits. Needed max A +2 to come to EOB as well as mod A to maintain sitting once there due to R and posterior lean. Pt attempted sit>stand 2x with max A +2 but was unsuccessful due to R lean and difficulty with fwd wt shift. Recommend AIR to achieve highest level function.  Pt currently with functional limitations due to the deficits listed below (see PT Problem List). Pt will benefit from skilled PT to increase their independence and safety with mobility to allow discharge to the venue listed below.          Recommendations for follow up therapy are one component of a multi-disciplinary discharge planning process, led by the attending physician.  Recommendations may be updated based on patient status, additional functional criteria and insurance authorization.  Follow Up Recommendations Acute inpatient rehab (3hours/day)    Assistance Recommended at Discharge Frequent or constant Supervision/Assistance  Patient can return home with the following  Two people to help with walking and/or transfers;Two people to help with bathing/dressing/bathroom;Assistance with feeding;Assistance with cooking/housework;Direct supervision/assist for medications management;Direct supervision/assist for financial  management;Assist for transportation;Help with stairs or ramp for entrance    Equipment Recommendations Other (comment) (TBD)  Recommendations for Other Services  Rehab consult    Functional Status Assessment Patient has had a recent decline in their functional status and demonstrates the ability to make significant improvements in function in a reasonable and predictable amount of time.     Precautions / Restrictions Precautions Precautions: Fall Restrictions Weight Bearing Restrictions: No      Mobility  Bed Mobility Overal bed mobility: Needs Assistance Bed Mobility: Supine to Sit, Sit to Supine     Supine to sit: Max assist, +2 for physical assistance Sit to supine: Total assist, +2 for physical assistance   General bed mobility comments: pt unable to initiate roll to L due to flaccid R side but was able to push with L elbow to come to sit but still needed max A +2 for LE's off bed and elevation of trunk away from bed. Tot A for return to supine    Transfers Overall transfer level: Needs assistance Equipment used: 2 person hand held assist Transfers: Sit to/from Stand Sit to Stand: Max assist, +2 physical assistance           General transfer comment: attempted sit>stand 2x but pt with heavy R lean and collapse towards that side as well as difficulty wt shifting forward. Was unable to clear buttocks from bed with max A +2    Ambulation/Gait               General Gait Details: unable  Stairs            Wheelchair Mobility    Modified Rankin (Stroke Patients  Only) Modified Rankin (Stroke Patients Only) Pre-Morbid Rankin Score: No symptoms Modified Rankin: Severe disability     Balance Overall balance assessment: Needs assistance Sitting-balance support: Feet supported, Single extremity supported Sitting balance-Leahy Scale: Poor Sitting balance - Comments: R lean, needed mod A to maintain sitting. Was able to lean L when cued Postural  control: Posterior lean, Right lateral lean Standing balance support: Bilateral upper extremity supported, During functional activity Standing balance-Leahy Scale: Zero                               Pertinent Vitals/Pain Pain Assessment Pain Assessment: No/denies pain Faces Pain Scale: No hurt    Home Living Family/patient expects to be discharged to:: Private residence Living Arrangements: Spouse/significant other Available Help at Discharge: Available 24 hours/day Type of Home: House Home Access: Stairs to enter Entrance Stairs-Rails: Right;Left;Can reach both Entrance Stairs-Number of Steps: 2   Home Layout: One level Home Equipment: Wheelchair - Forensic psychologist (2 wheels)      Prior Function Prior Level of Function : Independent/Modified Independent;Driving;Working/employed             Mobility Comments: no AD 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   Dominant Hand: Right    Extremity/Trunk Assessment   Upper Extremity Assessment Upper Extremity Assessment: Defer to OT evaluation    Lower Extremity Assessment Lower Extremity Assessment: RLE deficits/detail RLE Deficits / Details: hip flex 1+/5, knee ext 1+/5 RLE Sensation: decreased proprioception RLE Coordination: decreased fine motor;decreased gross motor    Cervical / Trunk Assessment Cervical / Trunk Assessment: Kyphotic  Communication   Communication: Expressive difficulties  Cognition Arousal/Alertness: Awake/alert Behavior During Therapy: Flat affect Overall Cognitive Status: Impaired/Different from baseline Area of Impairment: Orientation, Attention, Memory, Following commands, Safety/judgement, Awareness, Problem solving                 Orientation Level: Disoriented to, Situation, Time, Place Current Attention Level: Sustained Memory: Decreased short-term memory Following Commands: Follows one step commands inconsistently Safety/Judgement: Decreased  awareness of safety, Decreased awareness of deficits Awareness: Intellectual Problem Solving: Slow processing, Decreased initiation, Difficulty sequencing, Requires verbal cues, Requires tactile cues General Comments: pt unable to recall where he is but he does mention that his wife told him earlier this day. Complicated by slurred speech.        General Comments General comments (skin integrity, edema, etc.): Pt with audible wheezing sound while breathing. SPO2 maintained low 90's on RW. Pt reports that this is his normal and he sometimes uses an inhaler    Exercises     Assessment/Plan    PT Assessment Patient needs continued PT services  PT Problem List Decreased strength;Decreased range of motion;Decreased activity tolerance;Decreased balance;Decreased mobility;Decreased cognition;Decreased coordination;Decreased knowledge of use of DME;Decreased safety awareness;Decreased knowledge of precautions;Cardiopulmonary status limiting activity       PT Treatment Interventions DME instruction;Gait training;Stair training;Functional mobility training;Therapeutic activities;Therapeutic exercise;Balance training;Neuromuscular re-education;Cognitive remediation;Patient/family education;Wheelchair mobility training    PT Goals (Current goals can be found in the Care Plan section)  Acute Rehab PT Goals Patient Stated Goal: return home PT Goal Formulation: With patient Time For Goal Achievement: 01/05/22 Potential to Achieve Goals: Good    Frequency Min 4X/week     Co-evaluation PT/OT/SLP Co-Evaluation/Treatment: Yes Reason for Co-Treatment: Complexity of the patient's impairments (multi-system involvement);Necessary to address cognition/behavior during functional activity;For patient/therapist safety PT goals addressed during session: Mobility/safety with mobility;Balance  AM-PAC PT "6 Clicks" Mobility  Outcome Measure Help needed turning from your back to your side while in  a flat bed without using bedrails?: Total Help needed moving from lying on your back to sitting on the side of a flat bed without using bedrails?: Total Help needed moving to and from a bed to a chair (including a wheelchair)?: Total Help needed standing up from a chair using your arms (e.g., wheelchair or bedside chair)?: Total Help needed to walk in hospital room?: Total Help needed climbing 3-5 steps with a railing? : Total 6 Click Score: 6    End of Session   Activity Tolerance: Patient tolerated treatment well Patient left: in bed;with call bell/phone within reach;with bed alarm set Nurse Communication: Mobility status PT Visit Diagnosis: Unsteadiness on feet (R26.81);Hemiplegia and hemiparesis;Difficulty in walking, not elsewhere classified (R26.2) Hemiplegia - Right/Left: Right Hemiplegia - dominant/non-dominant: Dominant Hemiplegia - caused by: Nontraumatic SAH    Time: 4098-1191 PT Time Calculation (min) (ACUTE ONLY): 24 min   Charges:   PT Evaluation $PT Eval Moderate Complexity: 1 Mod          Lyanne Co, PT  Acute Rehab Services  Pager [phone] Office [phone]   Lawana Chambers Gared Gillie 12/22/2021, 4:36 PM

## 2021-12-22 NOTE — TOC CAGE-AID Note (Signed)
Transition of Care (TOC) - CAGE-AID Screening ? ? ?Patient Details  ?Name: Ronnie Jones ?MRN: [id number] ?Date of Birth: 09-Jan-1951 ? ?Transition of Care (TOC) CM/SW Contact:    ?Aubry Tucholski C Tarpley-Carter, LCSWA ?Phone Number: ?12/22/2021, 10:48 AM ? ? ?Clinical Narrative: ?Pt is unable to participate in Cage Aid. ?Pt is currently absent from room.  CSW will assess at a better time. ? ?Insurance underwriter, MSW, LCSW-A ?Pronouns:  She/Her/Hers ?[facility]itions of Care ?Clinical Social Worker ?Direct Number:  [phone] ?[email or web address] ? ?CAGE-AID Screening: ?Substance Abuse Screening unable to be completed due to: : Patient unable to participate ? ?  ?  ?  ?  ?  ? ?  ? ?  ? ? ? ? ? ? ?

## 2021-12-22 NOTE — Progress Notes (Signed)
PT Cancellation Note ? ?Patient Details ?Name: Mavin Dyke ?MRN: [id number] ?DOB: [birth year] ? ? ?Cancelled Treatment:    Reason Eval/Treat Not Completed: Active bedrest order. Will follow as activity orders progress.  ? ?Lyanne Co, PT  ?Acute Rehab Services ? Pager [phone] ?Office [phone] ? ? ? ?Banner Huckaba L Jaxon Flatt ?12/22/2021, 8:29 AM ?

## 2021-12-22 NOTE — Progress Notes (Signed)
?  Echocardiogram ?2D Echocardiogram has been performed. ? ?Augustine Radar ?12/22/2021, 9:34 AM ?

## 2021-12-22 NOTE — Progress Notes (Signed)
Dr. Rory Percy made aware that patient refused to take his 2200 blood pressure medications. Cleviprex infusing, PRN labetalol available. No new orders. ?

## 2021-12-22 NOTE — Evaluation (Signed)
Speech Language Pathology Evaluation ?Patient Details ?Name: Ronnie Jones ?MRN: [id number] ?DOB: May 24, 1951 ?Today's Date: 12/22/2021 ?Time: 2703-5009 ?SLP Time Calculation (min) (ACUTE ONLY): 24 min ? ?Problem List:  ?Patient Active Problem List  ? Diagnosis Date Noted  ? ICH (intracerebral hemorrhage) ([facility]) 12/21/2021  ? ?Past Medical History:  ?Past Medical History:  ?Diagnosis Date  ? CKD (chronic kidney disease)   ? COPD (chronic obstructive pulmonary disease) ([facility])   ? Hypertension   ? ?Past Surgical History: History reviewed. No pertinent surgical history. ?HPI:  ?Ronnie Jones is a 71 year old gentleman who presented via EMS as a Code Stroke after he was found at home with right sided weakness, sitting in his chair, by his wife. She had gone out to the store and returned to find him with right facial droop, right arm and leg weakness, unable to rise from chair. Head CT 5/15: "1. 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  2. 3 mm left-to-right midline shift, with slight increased  intraventricular hemorrhagic content than previously."  Pt with a past medical history of ETOH use (at least 1 pint per day), COPD, HTN, tobacco use (2-3 packs per day), and vitamin D deficiency.  ? ?Assessment / Plan / Recommendation ?Clinical Impression ? Pt presents with moderate cognitive linguistic deficits in setting of L thalamic ICH. Pt was assessed using the SLUMS and scored 11 of 26 adminisitered items.  Clock drawing task was deferred 2/2 R sided weakness making writing difficult.  Even if pt had scored perfectly on clock drawing task, pt would fall into lowest range of scoring (1-20); however, given documented acute changes this likely reflects ICH rather than a dementia process.  Pt's language is relatively spared. He did not have difficulty following directions for any task attempted.  During generative naming task, pt continued  naming items throughout time, but began to repeat earlier items, suggesting inattention was impacting reponse.  Pt also demonstrated difficulty on reverse digit span task and mathematical calculation, reflecting reduced attention.  Pt exhibited trouble with word recall task (1 of 5), with slight improvement in story recall (4 of 8).  Pt benefited from category cues to recall all remaining items in word recall task.  Pt denies cognitive-linguistic deficits, but family endorses changes. ? ?Pt's speech is characterised by articulatory imprecision, low volume and fast rate.  SLP required repetition of items from divergent naming task with 1 of 3 items intelligible wiht repetition.  Speech deficits are seemintly consistenct with unilateral upper motor neuron dysarthria.  Pt endorses changes to the way his speech sounds. ? ?SLP will follow in house to address above noted deficits and pt will likely need continued ST at next level of care. ?   ?SLP Assessment ? SLP Recommendation/Assessment: Patient needs continued Speech Lanaguage Pathology Services ?SLP Visit Diagnosis: Cognitive communication deficit (R41.841);Dysarthria and anarthria (R47.1)  ?  ?Recommendations for follow up therapy are one component of a multi-disciplinary discharge planning process, led by the attending physician.  Recommendations may be updated based on patient status, additional functional criteria and insurance authorization. ?   ?Follow Up Recommendations ? Acute inpatient rehab (3hours/day)  ?  ?Assistance Recommended at Discharge ? Frequent or constant Supervision/Assistance  ?Functional Status Assessment Patient has had a recent decline in their functional status and demonstrates the ability to make significant improvements in function in a reasonable and predictable amount of time.  ?Frequency and Duration min  2x/week  ?2 weeks ?  ?   ?SLP Evaluation ?Cognition ? Overall Cognitive Status: Impaired/Different from baseline ?Arousal/Alertness:  Awake/alert ?Orientation Level: Oriented to person;Oriented to place;Disoriented to time ?Year: 2021 ?Day of Week: Correct ?Attention: Focused;Sustained ?Focused Attention: Impaired ?Sustained Attention: Impaired ?Memory: Impaired ?Memory Impairment: Decreased short term memory ?Problem Solving: Impaired ?Problem Solving Impairment: Verbal basic  ?  ?   ?Comprehension ? Auditory Comprehension ?Overall Auditory Comprehension: Appears within functional limits for tasks assessed ?Conversation: Simple ?Visual Recognition/Discrimination ?Discrimination: Not tested ?Reading Comprehension ?Reading Status: Not tested  ?  ?Expression Expression ?Primary Mode of Expression: Verbal ?Verbal Expression ?Overall Verbal Expression: Appears within functional limits for tasks assessed ?Naming: Impairment ?Divergent: 75-100% accurate   ?Oral / Motor ? Oral Motor/Sensory Function ?Overall Oral Motor/Sensory Function: Mild impairment ?Facial ROM: Reduced right ?Facial Symmetry: Abnormal symmetry right ?Facial Strength: Reduced right ?Lingual ROM: Within Functional Limits ?Lingual Symmetry: Abnormal symmetry right (protrusion to R) ?Lingual Strength: Within Functional Limits ?Velum: Within Functional Limits ?Mandible: Within Functional Limits ?Motor Speech ?Overall Motor Speech: Impaired ?Respiration: Within functional limits ?Phonation: Normal ?Resonance: Within functional limits ?Articulation: Impaired ?Level of Impairment: Phrase ?Intelligibility: Intelligibility reduced ?Word: 75-100% accurate ?Phrase: 75-100% accurate ?Motor Planning: Witnin functional limits ?Motor Speech Errors: Aware   ?        ? ?Ronnie Jones E Ronnie Friesen, MA, CCC-SLP ?Acute Rehabilitation Services ?Office: [phone] ?12/22/2021, 10:41 AM ? ?

## 2021-12-23 ENCOUNTER — Inpatient Hospital Stay (HOSPITAL_COMMUNITY): Payer: Medicare HMO

## 2021-12-23 DIAGNOSIS — R0603 Acute respiratory distress: Secondary | ICD-10-CM | POA: Insufficient documentation

## 2021-12-23 DIAGNOSIS — G9340 Encephalopathy, unspecified: Secondary | ICD-10-CM | POA: Diagnosis not present

## 2021-12-23 DIAGNOSIS — J441 Chronic obstructive pulmonary disease with (acute) exacerbation: Secondary | ICD-10-CM

## 2021-12-23 DIAGNOSIS — J9601 Acute respiratory failure with hypoxia: Secondary | ICD-10-CM

## 2021-12-23 DIAGNOSIS — I610 Nontraumatic intracerebral hemorrhage in hemisphere, subcortical: Secondary | ICD-10-CM | POA: Diagnosis not present

## 2021-12-23 DIAGNOSIS — I619 Nontraumatic intracerebral hemorrhage, unspecified: Principal | ICD-10-CM | POA: Insufficient documentation

## 2021-12-23 DIAGNOSIS — N179 Acute kidney failure, unspecified: Secondary | ICD-10-CM | POA: Insufficient documentation

## 2021-12-23 LAB — BASIC METABOLIC PANEL
Anion gap: 12 (ref 5–15)
Anion gap: 13 (ref 5–15)
BUN: 29 mg/dL — ABNORMAL HIGH (ref 8–23)
BUN: 30 mg/dL — ABNORMAL HIGH (ref 8–23)
CO2: 17 mmol/L — ABNORMAL LOW (ref 22–32)
CO2: 15 mmol/L — ABNORMAL LOW (ref 22–32)
Calcium: 8.8 mg/dL — ABNORMAL LOW (ref 8.9–10.3)
Calcium: 8.8 mg/dL — ABNORMAL LOW (ref 8.9–10.3)
Chloride: 105 mmol/L (ref 98–111)
Chloride: 108 mmol/L (ref 98–111)
Creatinine, Ser: 2.22 mg/dL — ABNORMAL HIGH (ref 0.61–1.24)
Creatinine, Ser: 2.39 mg/dL — ABNORMAL HIGH (ref 0.61–1.24)
GFR, Estimated: 31 mL/min — ABNORMAL LOW (ref >60)
GFR, Estimated: 29 mL/min — ABNORMAL LOW (ref >60)
Glucose, Bld: 110 mg/dL — ABNORMAL HIGH (ref 70–99)
Glucose, Bld: 159 mg/dL — ABNORMAL HIGH (ref 70–99)
Potassium: 5.3 mmol/L — ABNORMAL HIGH (ref 3.5–5.1)
Potassium: 5 mmol/L (ref 3.5–5.1)
Sodium: 134 mmol/L — ABNORMAL LOW (ref 135–145)
Sodium: 136 mmol/L (ref 135–145)

## 2021-12-23 LAB — GLUCOSE, CAPILLARY
Glucose-Capillary: 129 mg/dL — ABNORMAL HIGH (ref 70–99)
Glucose-Capillary: 157 mg/dL — ABNORMAL HIGH (ref 70–99)
Glucose-Capillary: 172 mg/dL — ABNORMAL HIGH (ref 70–99)
Glucose-Capillary: 173 mg/dL — ABNORMAL HIGH (ref 70–99)

## 2021-12-23 LAB — POCT I-STAT 7, (LYTES, BLD GAS, ICA,H+H)
Acid-base deficit: 5 mmol/L — ABNORMAL HIGH (ref 0.0–2.0)
Bicarbonate: 20.8 mmol/L (ref 20.0–28.0)
Calcium, Ion: 1.24 mmol/L (ref 1.15–1.40)
HCT: 42 % (ref 39.0–52.0)
Hemoglobin: 14.3 g/dL (ref 13.0–17.0)
O2 Saturation: 100 %
Patient temperature: 99.2
Potassium: 4.4 mmol/L (ref 3.5–5.1)
Sodium: 135 mmol/L (ref 135–145)
TCO2: 22 mmol/L (ref 22–32)
pCO2 arterial: 39.3 mmHg (ref 32–48)
pH, Arterial: 7.332 — ABNORMAL LOW (ref 7.35–7.45)
pO2, Arterial: 208 mmHg — ABNORMAL HIGH (ref 83–108)

## 2021-12-23 LAB — CBC
HCT: 41.4 % (ref 39.0–52.0)
Hemoglobin: 14.2 g/dL (ref 13.0–17.0)
MCH: 30.8 pg (ref 26.0–34.0)
MCHC: 34.3 g/dL (ref 30.0–36.0)
MCV: 89.8 fL (ref 80.0–100.0)
Platelets: 290 10*3/uL (ref 150–400)
RBC: 4.61 MIL/uL (ref 4.22–5.81)
RDW: 13.4 % (ref 11.5–15.5)
WBC: 10.7 10*3/uL — ABNORMAL HIGH (ref 4.0–10.5)
nRBC: 0 % (ref 0.0–0.2)

## 2021-12-23 LAB — PROCALCITONIN: Procalcitonin: <0.1 ng/mL

## 2021-12-23 LAB — TRIGLYCERIDES: Triglycerides: 499 mg/dL — ABNORMAL HIGH (ref <150)

## 2021-12-23 LAB — BRAIN NATRIURETIC PEPTIDE: B Natriuretic Peptide: 136.8 pg/mL — ABNORMAL HIGH (ref 0.0–100.0)

## 2021-12-23 IMAGING — DX DG CHEST 1V PORT
1 series · 1 of 1 positions shown · non-contrast
Comparison: [DATE]

CLINICAL DATA: Shortness of breath

EXAM:
PORTABLE CHEST 1 VIEW

[chest]
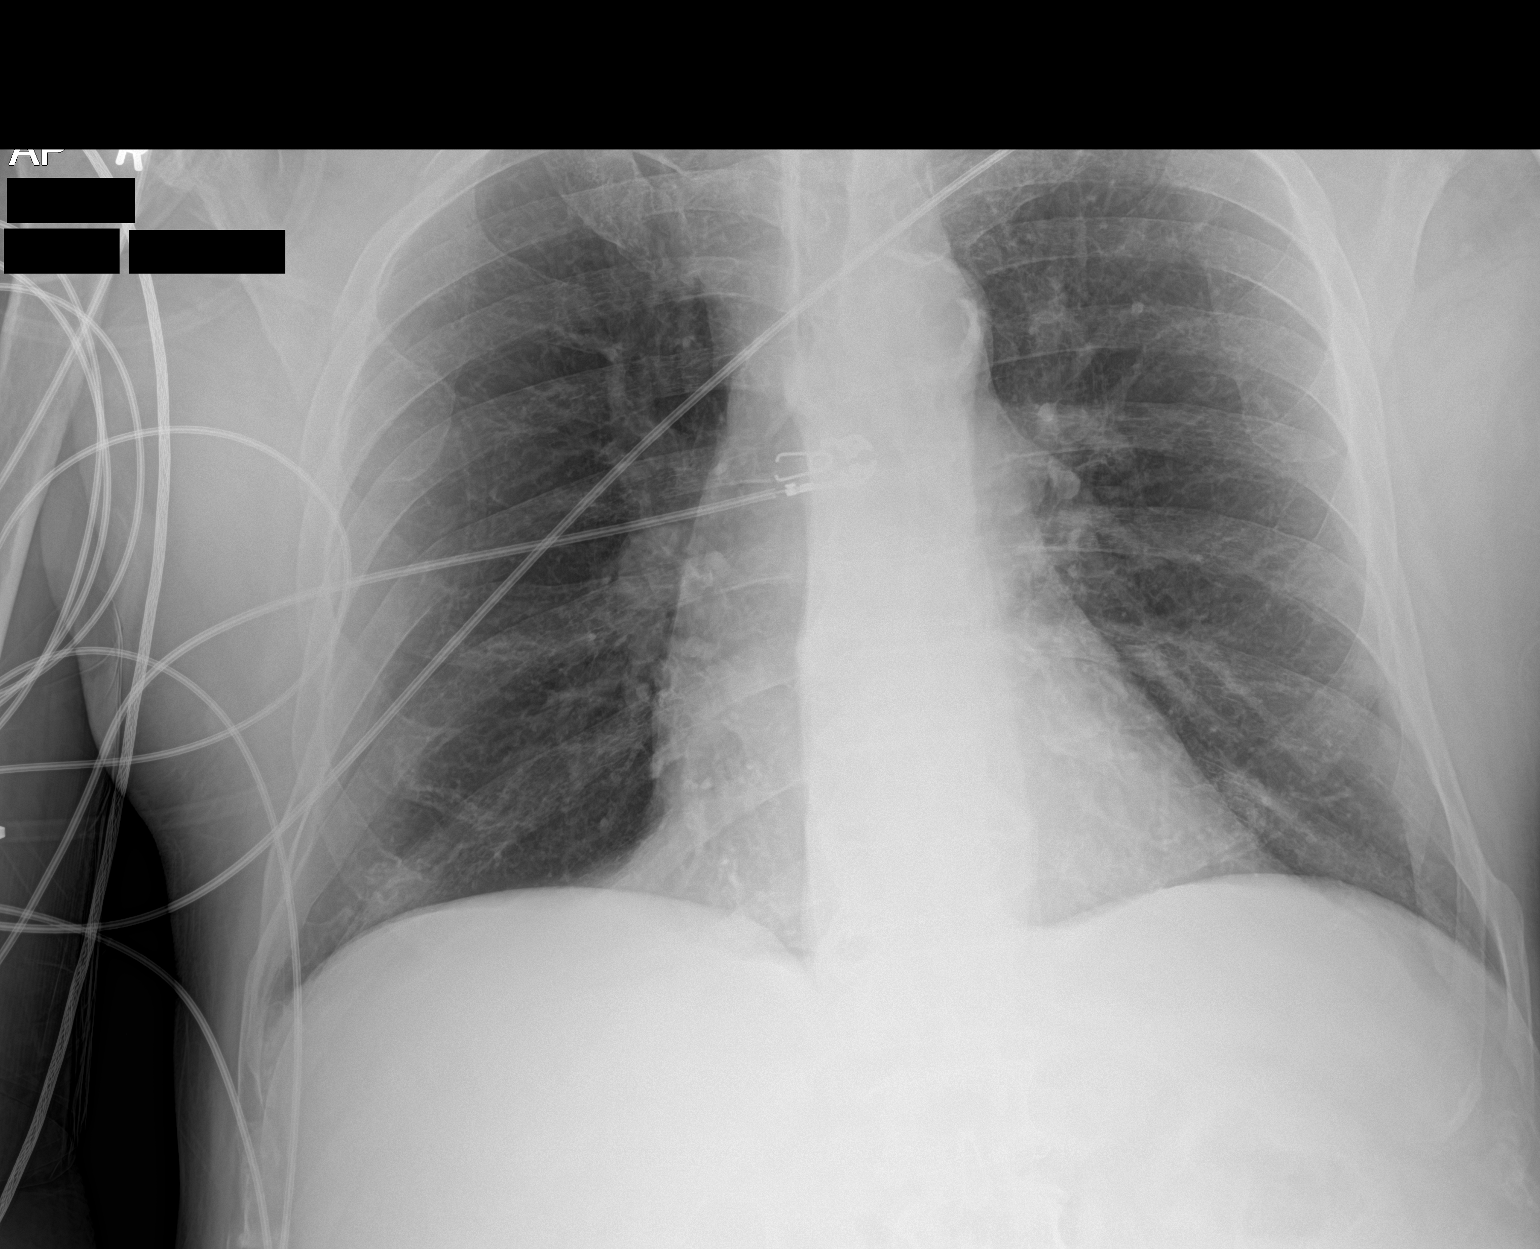

[1 of 1 positions shown; findings below may reference images not displayed]

FINDINGS: The heart size and mediastinal contours are within normal limits.
Aortic atherosclerosis. No focal airspace consolidation, pleural
effusion, or pneumothorax. The visualized skeletal structures are
unremarkable.
IMPRESSION: No active disease.

## 2021-12-23 MED ORDER — HYDRALAZINE HCL 50 MG PO TABS
100.0000 mg | ORAL_TABLET | Freq: Three times a day (TID) | ORAL | Status: DC
  Administered 2021-12-25 – 2021-12-31 (×19): 100 mg via ORAL
  Filled 2021-12-23 (×20): qty 2

## 2021-12-23 MED ORDER — SODIUM CHLORIDE 0.9 % IV SOLN
3.0000 g | Freq: Two times a day (BID) | INTRAVENOUS | Status: AC
  Administered 2021-12-23 – 2021-12-27 (×10): 3 g via INTRAVENOUS
  Filled 2021-12-23 (×11): qty 8

## 2021-12-23 MED ORDER — REVEFENACIN 175 MCG/3ML IN SOLN
175.0000 ug | Freq: Every day | RESPIRATORY_TRACT | Status: DC
  Administered 2021-12-23 – 2021-12-25 (×3): 175 ug via RESPIRATORY_TRACT
  Filled 2021-12-23 (×3): qty 3

## 2021-12-23 MED ORDER — FUROSEMIDE 10 MG/ML IJ SOLN
40.0000 mg | Freq: Every day | INTRAMUSCULAR | Status: DC
  Administered 2021-12-23 – 2021-12-24 (×2): 40 mg via INTRAVENOUS
  Filled 2021-12-23 (×2): qty 4

## 2021-12-23 MED ORDER — PHENOBARBITAL SODIUM 130 MG/ML IJ SOLN
97.5000 mg | Freq: Three times a day (TID) | INTRAMUSCULAR | Status: AC
  Administered 2021-12-23 – 2021-12-25 (×5): 97.5 mg via INTRAVENOUS
  Filled 2021-12-23 (×5): qty 1

## 2021-12-23 MED ORDER — PHENOBARBITAL SODIUM 130 MG/ML IJ SOLN
97.5000 mg | Freq: Once | INTRAMUSCULAR | Status: AC
  Administered 2021-12-23: 97.5 mg via INTRAVENOUS
  Filled 2021-12-23: qty 1

## 2021-12-23 MED ORDER — NICARDIPINE HCL IN NACL 20-0.86 MG/200ML-% IV SOLN
3.0000 mg/h | INTRAVENOUS | Status: DC
  Administered 2021-12-23 – 2021-12-24 (×5): 5 mg/h via INTRAVENOUS
  Administered 2021-12-24: 7.5 mg/h via INTRAVENOUS
  Administered 2021-12-24 – 2021-12-25 (×4): 5 mg/h via INTRAVENOUS
  Filled 2021-12-23 (×12): qty 200

## 2021-12-23 MED ORDER — PHENOBARBITAL SODIUM 65 MG/ML IJ SOLN
32.5000 mg | Freq: Three times a day (TID) | INTRAMUSCULAR | Status: AC
  Administered 2021-12-27 – 2021-12-29 (×6): 32.5 mg via INTRAVENOUS
  Filled 2021-12-23 (×6): qty 1

## 2021-12-23 MED ORDER — ARFORMOTEROL TARTRATE 15 MCG/2ML IN NEBU
15.0000 ug | INHALATION_SOLUTION | Freq: Two times a day (BID) | RESPIRATORY_TRACT | Status: DC
  Administered 2021-12-23 – 2021-12-25 (×5): 15 ug via RESPIRATORY_TRACT
  Filled 2021-12-23 (×5): qty 2

## 2021-12-23 MED ORDER — METHYLPREDNISOLONE SODIUM SUCC 40 MG IJ SOLR
40.0000 mg | Freq: Every day | INTRAMUSCULAR | Status: DC
  Administered 2021-12-23 – 2021-12-24 (×2): 40 mg via INTRAVENOUS
  Filled 2021-12-23 (×2): qty 1

## 2021-12-23 MED ORDER — PHENOBARBITAL SODIUM 65 MG/ML IJ SOLN
65.0000 mg | Freq: Three times a day (TID) | INTRAMUSCULAR | Status: AC
  Administered 2021-12-25 – 2021-12-27 (×6): 65 mg via INTRAVENOUS
  Filled 2021-12-23 (×6): qty 1

## 2021-12-23 MED ORDER — LORAZEPAM 2 MG/ML IJ SOLN: 1.0000 mg | INTRAMUSCULAR | Status: DC | PRN

## 2021-12-23 MED ORDER — ALBUTEROL SULFATE (2.5 MG/3ML) 0.083% IN NEBU
10.0000 mg/h | INHALATION_SOLUTION | RESPIRATORY_TRACT | Status: DC
  Administered 2021-12-23: 10 mg/h via RESPIRATORY_TRACT
  Filled 2021-12-23: qty 48
  Filled 2021-12-23: qty 12
  Filled 2021-12-23 (×3): qty 48

## 2021-12-23 MED ORDER — NICARDIPINE HCL IN NACL 40-0.83 MG/200ML-% IV SOLN: 3.0000 mg/h | INTRAVENOUS | Status: DC

## 2021-12-23 NOTE — PMR Pre-admission (Signed)
PMR Admission Coordinator Pre-Admission Assessment  Patient: Ronnie Jones is an 71 y.o., male MRN: [id number] DOB: 21-Jul-1951 Height: 5' 7"  (170.2 cm) Weight: 77.1 kg  Insurance Information HMO:  yes   PPO:      PCP:      IPA:      80/20:      OTHER:  PRIMARY: [employer] Medicare      Policy#: [id number]      Subscriber:Pt CM Name: Dewitt Rota      Phone#: [phone]     Fax#: [phone] Pre-Cert#: [id number]   approved for 7 days from 12/30/21 with f/u due on 01/06/22   Employer:  Benefits:  Phone #: [phone]     Name: 5/22 Eff. Date: 08/09/21     Deduct: none      Out of Pocket Max: $4500      Life Max: none CIR: $295 co pay per day days 1 until 6      SNF: no copay days 1 until 20; $196 co pay per day days 21 until 100 Outpatient: $35 per visit     Co-Pay: visits per medical neccesity Home Health: 10%      Co-Pay: visits per medical neccesity DME: 80%     Co-Pay: 20% Providers: in network  SECONDARY: none  Financial Counselor:       Phone#:   The Engineer, petroleum" for patients in Inpatient Rehabilitation Facilities with attached "Privacy Act [place] Records" was provided and verbally reviewed with:   Emergency Contact Information Contact Information     Name Relation Home Work [place] Spouse [phone]  [phone]      Current Medical History  Patient Admitting Diagnosis: ICH  History of Present Illness: A 71 year old gentleman with a past medical history of ETOH use (at least 1 pint per day), COPD, HTN, tobacco use (2-3 packs per day), DM,and vitamin D deficiency who presented to [facility] ED 12/21/21. He presented via EMS as a Code Stroke after he was found at home with right sided weakness, sitting in his chair, by his wife.    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 Mild surrounding edema and local mass  effect without significant midline shift. 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 Bilateral carotid bifurcation atherosclerosis without greater than 50% stenosis. MRI not able to be performed due to respiratory issues.  Reportedly taking ASA daily  now on none due to [place].  LDL 192 to consider Crestor at discharge. Type 2 DM with Hgb A1c 6.1 on Metformin. SLP eval and placed on Dysphagia 3 diet. MBS pending. Current smoker with nicotine patch provided. Heavy ETOH abuse with CIWA protocol activated with Ativan. Heparin for DVT prophylaxis. HTN to continue on Atenolol and hydralazine and amlodipine.  Echo without cardiogenic source. Carotids normal. COPD exacerbation with increase of solumedrol, continue Anoro, increase ipratropium and continue PPI. Placed on Unasyn for aspiration pneumonia.   Complete NIHSS TOTAL: 13  Patient's medical record from [facility] has been reviewed by the rehabilitation admission coordinator and physician.  Past Medical History  Past Medical History:  Diagnosis Date   CKD (chronic kidney disease)    COPD (chronic obstructive pulmonary disease) (HCC)    Diabetes ([place])    Hypertension    Nodule of right lung    Tobacco abuse    Has  the patient had major surgery during 100 days prior to admission? No  Family History   family history is not on file.  Current Medications  Current Facility-Administered Medications:    acetaminophen (TYLENOL) tablet 650 mg, 650 mg, Oral, Q4H PRN, 650 mg at 12/29/21 0839 **OR** acetaminophen (TYLENOL) 160 MG/5ML solution 650 mg, 650 mg, Per Tube, Q4H PRN **OR** acetaminophen (TYLENOL) suppository 650 mg, 650 mg, Rectal, Q4H PRN, Bartholomew Crews, Kristi, PA-C   amLODipine (NORVASC) tablet 10 mg, 10 mg, Oral, Daily, Shafer, Devon, NP, 10 mg at 12/30/21 7412   ascorbic acid (VITAMIN C) tablet 500 mg, 500 mg, Oral, Daily, Danford, Christopher P, MD, 500 mg at 12/30/21 0912   atenolol  (TENORMIN) tablet 50 mg, 50 mg, Oral, Daily, Rosalin Hawking, MD, 50 mg at 12/30/21 0911   chlorhexidine (PERIDEX) 0.12 % solution 15 mL, 15 mL, Mouth Rinse, BID, Amie Portland, MD, 15 mL at 12/30/21 2109   Chlorhexidine Gluconate Cloth 2 % PADS 6 each, 6 each, Topical, Daily, Kerney Elbe, MD, 6 each at 12/26/21 8786   feeding supplement (JEVITY 1.5 CAL/FIBER) liquid 1,000 mL, 1,000 mL, Per Tube, Continuous, Samtani, Jai-Gurmukh, MD, Last Rate: 25 mL/hr at 12/30/21 1724, 1,000 mL at 12/30/21 1724   feeding supplement (PROSource TF) liquid 45 mL, 45 mL, Per Tube, BID, Samtani, Jai-Gurmukh, MD, 45 mL at 76/72/09 4709   folic acid (FOLVITE) tablet 1 mg, 1 mg, Oral, Daily, Godfrey Pick, MD, 1 mg at 12/30/21 0912   free water 75 mL, 75 mL, Per Tube, Q4H, Samtani, Jai-Gurmukh, MD, 75 mL at 12/31/21 0402   heparin injection 5,000 Units, 5,000 Units, Subcutaneous, Q8H, Julian Hy, DO, 5,000 Units at 12/31/21 6283   hydrALAZINE (APRESOLINE) tablet 100 mg, 100 mg, Oral, Q8H, Rosalin Hawking, MD, 100 mg at 12/31/21 6629   insulin aspart (novoLOG) injection 0-15 Units, 0-15 Units, Subcutaneous, TID WC, Danford, Suann Larry, MD, 5 Units at 12/30/21 1724   insulin aspart (novoLOG) injection 0-5 Units, 0-5 Units, Subcutaneous, QHS, Danford, Christopher P, MD   ipratropium-albuterol (DUONEB) 0.5-2.5 (3) MG/3ML nebulizer solution 3 mL, 3 mL, Nebulization, Q2H PRN, Danford, Suann Larry, MD, 3 mL at 12/30/21 2148   labetalol (NORMODYNE) injection 10-20 mg, 10-20 mg, Intravenous, Q10 min PRN, Rosalin Hawking, MD, 20 mg at 12/28/21 0030   LORazepam (ATIVAN) injection 1 mg, 1 mg, Intravenous, Q4H PRN, Julian Hy, DO   MEDLINE mouth rinse, 15 mL, Mouth Rinse, q12n4p, Amie Portland, MD, 15 mL at 12/30/21 1700   melatonin tablet 3 mg, 3 mg, Oral, QHS, Danford, Christopher P, MD, 3 mg at 12/30/21 2110   nicotine (NICODERM CQ - dosed in mg/24 hours) patch 21 mg, 21 mg, Transdermal, Daily, Kehoe, Kristi, PA-C, 21 mg at  12/30/21 0912   pantoprazole (PROTONIX) EC tablet 40 mg, 40 mg, Oral, Daily, Rosalin Hawking, MD, 40 mg at 12/30/21 0912   predniSONE (DELTASONE) tablet 30 mg, 30 mg, Oral, Q breakfast, Danford, Suann Larry, MD, 30 mg at 12/30/21 0911   rosuvastatin (CRESTOR) tablet 40 mg, 40 mg, Oral, QHS, Danford, Christopher P, MD, 40 mg at 12/30/21 2110   senna-docusate (Senokot-S) tablet 1 tablet, 1 tablet, Oral, BID, Kehoe, Kristi, PA-C, 1 tablet at 12/30/21 2110   sodium chloride flush (NS) 0.9 % injection 3 mL, 3 mL, Intravenous, Once, Kerney Elbe, MD   thiamine tablet 100 mg, 100 mg, Oral, Daily, Noemi Chapel P, DO, 100 mg at 12/30/21 0911   umeclidinium-vilanterol (ANORO ELLIPTA) 62.5-25 MCG/ACT 1 puff, 1  puff, Inhalation, Daily, Julian Hy, DO, 1 puff at 12/31/21 5449   Vitamin D (Ergocalciferol) (DRISDOL) capsule 50,000 Units, 50,000 Units, Oral, Q7 days, Julian Hy, DO  Patients Current Diet:  Diet Order             Diet - low sodium heart healthy           DIET - DYS 1 Room service appropriate? No; Fluid consistency: Honey Thick  Diet effective now                  Precautions / Restrictions Precautions Precautions: Fall Precaution Comments: R hemiparesis Restrictions Weight Bearing Restrictions: No   Has the patient had 2 or more falls or a fall with injury in the past year? No  Prior Activity Level Community (5-7x/wk): Pt. active in the community PTA  Prior Functional Level Self Care: Did the patient need help bathing, dressing, using the toilet or eating? Independent  Indoor Mobility: Did the patient need assistance with walking from room to room (with or without device)? Independent  Stairs: Did the patient need assistance with internal or external stairs (with or without device)? Independent  Functional Cognition: Did the patient need help planning regular tasks such as shopping or remembering to take medications? Independent  Patient Information Are you of  Hispanic, Latino/a,or Spanish origin?: A. No, not of Hispanic, Latino/a, or Spanish origin What is your race?: A. White Do you need or want an interpreter to communicate with a doctor or health care staff?: 0. No  Patient's Response To:  Health Literacy and Transportation Is the patient able to respond to health literacy and transportation needs?: No  Development worker, international aid / [place] Devices/Equipment: None Home Equipment: Wheelchair - manual, Conservation officer, nature (2 wheels)  Prior Device Use: Indicate devices/aids used by the patient prior to current illness, exacerbation or injury? None of the above  Current Functional Level Cognition  Arousal/Alertness: Awake/alert Overall Cognitive Status: Impaired/Different from baseline Current Attention Level: Sustained Orientation Level: Oriented X4 Following Commands: Follows one step commands with increased time Safety/Judgement: Decreased awareness of safety, Decreased awareness of deficits General Comments: pt remains with flat affect and reports "I feel like shit". Pt gives good  effort during therapy Attention: Focused, Sustained Focused Attention: Impaired Sustained Attention: Impaired Memory: Impaired Memory Impairment: Decreased short term memory Problem Solving: Impaired Problem Solving Impairment: Verbal basic    Extremity Assessment (includes Sensation/Coordination)  Upper Extremity Assessment: RUE deficits/detail RUE Deficits / Details: flaccid RUE Sensation: decreased light touch, decreased proprioception RUE Coordination: decreased fine motor, decreased gross motor  Lower Extremity Assessment: Defer to PT evaluation RLE Deficits / Details: hip flex 1+/5, knee ext 1+/5 RLE Sensation: decreased proprioception RLE Coordination: decreased fine motor, decreased gross motor    ADLs  Overall ADL's : Needs assistance/impaired Eating/Feeding: Minimal assistance Eating/Feeding Details (indicate cue type and  reason): requires cups items stable, can use LUE to self feed Grooming: Minimal assistance Grooming Details (indicate cue type and reason): rubbing lotion on RUE with LUE Upper Body Bathing: Supervision/ safety, Sitting Upper Body Bathing Details (indicate cue type and reason): simulated, use of LUE to wash RUE Lower Body Bathing: Maximal assistance, +2 for physical assistance, +2 for safety/equipment Upper Body Dressing : Moderate assistance, Sitting Lower Body Dressing: Maximal assistance, +2 for physical assistance, +2 for safety/equipment Toilet Transfer: Moderate assistance, +2 for physical assistance Toilet Transfer Details (indicate cue type and reason): simulated to chair Toileting- Clothing Manipulation and Hygiene: Total assistance  Functional mobility during ADLs: Maximal assistance, +2 for physical assistance, +2 for safety/equipment General ADL Comments: R hemi, poor sitting balance, difficulty breathing, impaired cognition    Mobility  Overal bed mobility: Needs Assistance Bed Mobility: Rolling, Sidelying to Sit Rolling: Min assist Sidelying to sit: Mod assist, HOB elevated Supine to sit: Mod assist, HOB elevated Sit to supine: Mod assist, HOB elevated Sit to sidelying: Mod assist General bed mobility comments: directional verbal cues, assist for R LE management off EOB, modA for trunk elevation, pt with solid effort in trying to pull self up with L UE    Transfers  Overall transfer level: Needs assistance Equipment used: 2 person hand held assist Transfers: Sit to/from Stand, Bed to chair/wheelchair/BSC Sit to Stand: Mod assist, +2 physical assistance Bed to/from chair/wheelchair/BSC transfer type:: Step pivot General transfer comment: pt modAx2 with use of bed pad to power up with R knee blocked, maxA to complete step to transfer to chair, maxA for R LE advancement, requires blocking of R knee to advance L LE    Ambulation / Gait / Stairs / Wheelchair Mobility   Ambulation/Gait Ambulation/Gait assistance: Max Web designer (Feet): 2 Feet Assistive device: 1 person hand held assist Gait Pattern/deviations: Shuffle General Gait Details: x1 sidestep toward [facility] but unstable, unsafe to progress further with only +1 assist Gait velocity interpretation: <1.31 ft/sec, indicative of household ambulator    Posture / Balance Dynamic Sitting Balance Sitting balance - Comments: cannot reach outside BOS Balance Overall balance assessment: Needs assistance Sitting-balance support: Single extremity supported, Feet supported Sitting balance-Leahy Scale: Poor Sitting balance - Comments: cannot reach outside BOS Postural control: Right lateral lean Standing balance support: Single extremity supported Standing balance-Leahy Scale: Poor Standing balance comment: worked on standing at EOB and trying to lift up L LE to promote increased WBing through R LE with R knee blocked, pt reports "I can't feel a thing" , worked again in standing at sink with PT focusing on R Hip and knee kinematics for optimal WBing potential while OT worked with UEs functionally    Special needs/care consideration CIWa protocol Smoker Hgb A1c 6.1   Previous Home Environment  Living Arrangements: Spouse/significant other  Lives With: Spouse Available Help at Discharge: Available 24 hours/day Type of Home: House Home Layout: One level Home Access: Stairs to enter Entrance Stairs-Rails: Right, Left, Can reach both Entrance Stairs-Number of Steps: 2 Bathroom Shower/Tub: Tub/shower unit, Multimedia programmer: Standard Bathroom Accessibility: Yes How Accessible: Accessible via walker Home Care Services: No  Discharge Living Setting Plans for Discharge Living Setting: Patient's home Type of Home at Discharge: House Discharge Home Layout: One level Discharge Home Access: Level entry Discharge Bathroom Shower/Tub: Tub/shower unit Discharge Bathroom Toilet:  Standard Discharge Bathroom Accessibility: Yes How Accessible: Accessible via walker Does the patient have any problems obtaining your medications?: No  Social/Family/Support Systems Patient Roles: Spouse Contact Information: [phone] Anticipated Caregiver: patricia mccal (wife Ability/Limitations of Caregiver: Can provide mod A Caregiver Availability: 24/7 Discharge Plan Discussed with Primary Caregiver: Yes Is Caregiver In Agreement with Plan?: Yes Does Caregiver/Family have Issues with Lodging/Transportation while Pt is in Rehab?: No  Goals Patient/Family Goal for Rehab: PT/OT/SLP Mod A Expected length of stay: 18-21 days Pt/Family Agrees to Admission and willing to participate: Yes Program Orientation Provided & Reviewed with Pt/Caregiver Including Roles  & Responsibilities: Yes  Decrease burden of Care through IP rehab admission: n/a  Possible need for SNF placement upon discharge: not anticipated   Patient Condition:  I have reviewed medical records from [facility], spoken with CM, and patient and spouse. I met with patient at the bedside for inpatient rehabilitation assessment.  Patient will benefit from ongoing PT, OT, and SLP, can actively participate in 3 hours of therapy a day 5 days of the week, and can make measurable gains during the admission.  Patient will also benefit from the coordinated team approach during an Inpatient Acute Rehabilitation admission.  The patient will receive intensive therapy as well as Rehabilitation physician, nursing, social worker, and care management interventions.  Due to safety, skin/wound care, disease management, medication administration, pain management, and patient education the patient requires 24 hour a day rehabilitation nursing.  The patient is currently mod to max assist with mobility and basic ADLs.  Discharge setting and therapy post discharge at home with home health is anticipated.  Patient has agreed to  participate in the Acute Inpatient Rehabilitation Program and will admit today.  Preadmission Screen Completed [id number] Boyette RN MSN and updated by  Retta Diones, 12/31/2021 10:30 AM ______________________________________________________________________   Discussed status with Dr. Dagoberto Ligas on 12/31/21 at 10:31am and received approval for admission today.  Admission Coordinator: Danne Baxter RN MSN with updates by  Retta Diones, RN, time 10:32 am/Date 12/31/21   Assessment/Plan: Diagnosis: Does the need for close, 24 hr/day Medical supervision in concert with the patient's rehab needs make it unreasonable for this patient to be served in a less intensive setting? Yes Co-Morbidities requiring supervision/potential complications: Severe COPD- smokes 2-3 ppd; heavy EtOH use s/p CIWA- R heimplegia; L IPH; dysphagia; aspiration pneumonia Due to bladder management, bowel management, safety, skin/wound care, disease management, medication administration, pain management, and patient education, does the patient require 24 hr/day rehab nursing? Yes Does the patient require coordinated care of a physician, rehab nurse, PT, OT, and SLP to address physical and functional deficits in the context of the above medical diagnosis(es)? Yes Addressing deficits in the following areas: balance, endurance, locomotion, strength, transferring, bowel/bladder control, bathing, dressing, feeding, grooming, toileting, cognition, speech, language, and swallowing Can the patient actively participate in an intensive therapy program of at least 3 hrs of therapy 5 days a week? Yes The potential for patient to make measurable gains while on inpatient rehab is good and fair Anticipated functional outcomes upon discharge from inpatient rehab: min assist PT, min assist OT, min assist SLP Estimated rehab length of stay to reach the above functional goals is: 18-21 days Anticipated discharge destination: Home 10. Overall  Rehab/Functional Prognosis: good and fair   MD Signature:

## 2021-12-23 NOTE — TOC CAGE-AID Note (Signed)
Transition of Care (TOC) - CAGE-AID Screening ? ? ?Patient Details  ?Name: Ronnie Jones ?MRN: [id number] ?Date of Birth: 1951-06-25 ? ?Transition of Care (TOC) CM/SW Contact:    ?Samamtha Tiegs C Tarpley-Carter, LCSWA ?Phone Number: ?12/23/2021, 1:53 PM ? ? ?Clinical Narrative: ?Pt is unable to participate in Cage Aid. ?CSW will assess at a better time. ? ?Passenger transport manager, MSW, LCSW-A ?Pronouns:  She/Her/Hers ?[facility] HealthTransitions of Care ?Clinical Social Worker ?Direct Number:  [phone] ?[email or web address]  ?CAGE-AID Screening: ?Substance Abuse Screening unable to be completed due to: : Patient unable to participate ? ?  ?  ?  ?  ?  ? ?  ? ?  ? ? ? ? ? ? ?

## 2021-12-23 NOTE — Progress Notes (Signed)
PT Cancellation Note ? ?Patient Details ?Name: Ronnie Jones ?MRN: [id number] ?DOB: 04/16/1951 ? ? ?Cancelled Treatment:    Reason Eval/Treat Not Completed: Medical issues which prohibited therapy. Pt on BiPAP after developing respiratory distress today. PT to hold at this time. ? ? ?Arlyss Gandy ?12/23/2021, 1:52 PM ?

## 2021-12-23 NOTE — Progress Notes (Signed)
Inpatient Rehab Admissions Coordinator:  ? ? Discussed potential CIR admit with pt.'s wife (Pt. Currently obtunded, on BiPAP). She is interested once Pt is medically ready. I will follow for potential admit pending medical readiness, insurance auth, and bed availability.  ? ?Megan Salon, MS, CCC-SLP ?Rehab Admissions Coordinator  ?[phone] (celll) ?[phone] (office) ? ?

## 2021-12-23 NOTE — Progress Notes (Signed)
Alerted Dr. Roda Shutters that the patient is more lethargic than this morning.  He is requiring more stimulation to awaken.  No new orders were given and the MD requested to keep monitoring. ?

## 2021-12-23 NOTE — Progress Notes (Addendum)
STROKE TEAM PROGRESS NOTE  ? ?INTERVAL HISTORY ?Patient is seen in his room with his wife at the bedside.. He has been hemodynamically stable but has gone into respiratory distress and has been placed on BiPAP.  He is tolerating it well.  K elevated this AM to 5.3, Cr to 2.22.  Will recheck BMP this afternoon. ? ?Vitals:  ? 12/23/21 1019 12/23/21 1100 12/23/21 1200 12/23/21 1300  ?BP:  128/73 (!) 143/83 135/73  ?Pulse: 76 79 84 83  ?Resp: (!) 23 (!) 26 (!) 24 20  ?Temp:   98.1 ?F (36.7 ?C)   ?TempSrc:   Axillary   ?SpO2: 98% 98% 98% 98%  ?Weight:      ?Height:      ? ?CBC:  ?Recent Labs  ?Lab 12/21/21 ?1737 12/21/21 ?1745 12/23/21 ?[id number] 12/23/21 ?1016  ?WBC 6.8  --  10.7*  --   ?NEUTROABS 4.6  --   --   --   ?HGB 14.8   < > 14.2 14.3  ?HCT 43.9   < > 41.4 42.0  ?MCV 89.0  --  89.8  --   ?PLT 286  --  290  --   ? < > = values in this interval not displayed.  ? ? ?Basic Metabolic Panel:  ?Recent Labs  ?Lab 12/21/21 ?1737 12/21/21 ?1745 12/23/21 ?[id number] 12/23/21 ?1016  ?NA 135 136 134* 135  ?K 4.8 4.4 5.3* 4.4  ?CL 103 106 105  --   ?CO2 22  --  17*  --   ?GLUCOSE 129* 118* 110*  --   ?BUN 32* 30* 29*  --   ?CREATININE 1.36* 1.20 2.22*  --   ?CALCIUM 8.9  --  8.8*  --   ? ? ?Lipid Panel:  ?Recent Labs  ?Lab 12/21/21 ?1737 12/23/21 ?0607  ?CHOL 321*  --   ?TRIG 339* 499*  ?HDL 61  --   ?CHOLHDL 5.3  --   ?VLDL 68*  --   ?LDLCALC 192*  --   ? ? ?HgbA1c:  ?Recent Labs  ?Lab 12/21/21 ?1737  ?HGBA1C 6.1*  ? ? ?Urine Drug Screen:  ?Recent Labs  ?Lab 12/22/21 ?0757  ?LABOPIA NONE DETECTED  ?COCAINSCRNUR NONE DETECTED  ?LABBENZ NONE DETECTED  ?AMPHETMU NONE DETECTED  ?THCU NONE DETECTED  ?LABBARB NONE DETECTED  ?  ?Alcohol Level  ?Recent Labs  ?Lab 12/21/21 ?2032  ?ETH <10  ? ? ? ?IMAGING past 24 hours ?CT HEAD WO CONTRAST (5MM) ? ?Result Date: 12/23/2021 ?CLINICAL DATA:  Hemorrhage follow-up EXAM: CT HEAD WITHOUT CONTRAST TECHNIQUE: Contiguous axial images were obtained from the base of the skull through the vertex without  intravenous contrast. RADIATION DOSE REDUCTION: This exam was performed according to the departmental dose-optimization program which includes automated exposure control, adjustment of the mA and/or kV according to patient size and/or use of iterative reconstruction technique. COMPARISON:  Head CT 12/21/2021 FINDINGS: Brain: Unchanged size of intraparenchymal hematoma centered in the left thalamus. There is unchanged intraventricular extension. Trace rightward midline shift is unchanged. No hydrocephalus or ventricular entrapment. Vascular: Atherosclerotic calcification of the internal carotid arteries at the skull base. No abnormal hyperdensity of the major intracranial arteries or dural venous sinuses. Skull: The visualized skull base, calvarium and extracranial soft tissues are normal. Sinuses/Orbits: No fluid levels or advanced mucosal thickening of the visualized paranasal sinuses. No mastoid or middle ear effusion. The orbits are normal. IMPRESSION: Unchanged size of left thalamic intraparenchymal hematoma with intraventricular extension. No hydrocephalus or ventricular entrapment. Electronically Signed  By: Deatra Robinson M.D.   On: 12/23/2021 01:59  ? ?DG CHEST PORT 1 VIEW ? ?Result Date: 12/23/2021 ?CLINICAL DATA:  Shortness of breath EXAM: PORTABLE CHEST 1 VIEW COMPARISON:  12/22/2021 FINDINGS: The heart size and mediastinal contours are within normal limits. Aortic atherosclerosis. No focal airspace consolidation, pleural effusion, or pneumothorax. The visualized skeletal structures are unremarkable. IMPRESSION: No active disease. Electronically Signed   By: Duanne Guess D.O.   On: 12/23/2021 10:04   ? ?PHYSICAL EXAM ? ?Physical Exam  ?Constitutional: Appears well-developed and well-nourished.  ?Cardiovascular: Normal rate and regular rhythm.  ?Respiratory: Labored respirations on BiPAP ? ?Neuro: ?Mental Status: ?Patient is drowsy but responds to name and will follow commands with left side ?Cranial  Nerves: ?II: Visual Fields are full. Pupils are equal, round, and reactive to light.   ?III,IV, VI: EOMI without ptosis or diploplia.  ?VIII: Hearing is intact to voice ?Motor: ?Tone is normal. Bulk is normal.  ?Left hemibody 5/5, follows commands ?Right UE: moves to noxious stimuli ?Right LE: 2/5, follows commands ?Sensory: ?Sensation is symmetric to light touch in the arms and legs.  ?Cerebellar: ?Unable to participate ? ? ? ?ASSESSMENT/PLAN ?[name] is a 71 y.o. male with history of ETOH use (at least 1 pint per day), COPD, HTN, tobacco use (2-3 packs per day), and vitamin D deficiency presenting with right sided weakness, right facial droop. CIWA protocol initiated with ativan, last drink 5/14 evening. MRI pending. CTA shows severe stenosis. PT, OT pending. MRI pending. Dysphgia 3 diet ordered per SLP. PO medications initiated but held this AM due to respiratory distress.  Patient placed on BiPAP. ? 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 Mild surrounding edema and local mass effect without significant midline shift. 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 Bilateral carotid bifurcation atherosclerosis without greater than 50% stenosis. ?Follow up CT 5/17 Unchanged size of left thalamic IPH ?MRI not able to perform due to respiratory distress ?2D Echo EF 65-70% ?LDL 192 ?HgbA1c 6.1 ?VTE prophylaxis - SCDs ?Reportedly taking 812mg  of ASA daily for pain prior to admission, now on No antithrombotic due to ICH ?Therapy recommendations:  Pending ?Disposition:  Pending ? ?Respiratory distress/failure ?COPD ?Baseline wheezing at home due to COPD ?Worsening wheezing this admission - lasix  40 x 1, breathing treatment ?PRN nebs ordered ?CCM on board ?Currently on BiPAP, tolerating well ? ?Hypertensive emergency ?Home meds:  Lisinopril 20mg , amlodipine 10mg , atenolol 50mg  ?IV cleviprex -> change to nicardipine today due to elevated triglycerides ?Resume home amlodipine, atenolol  ?Added hydralazine 50->100 every 8h ?D/c lisinopril due to AKI ?Long-term BP goal normotensive ?BP goal less than 160 ? ?Hyperlipidemia ?Home meds:  Crestor 20mg  (not taking) ?LDL 192, goal < 70 ?Consider Crestor 40 at discharge ? ?Diabetes type II Controlled ?Home meds:  Metformin ?HgbA1c 6.1, goal < 7.0 ?CBGs ?SSI ?Close PCP follow-up ? ?Dysphagia ?SLP following ?Dysphagia 3 diet, thin liquid -> now NPO due to respiratory distress ?Hold off BP po meds for now ? ?Tobacco abuse ?Current smoker, smoking 2-3ppd ?Smoking cessation counseling provided ?Nicotine patch provided ?Pt is willing to quit ? ?Alcohol abuse ?Heavy drinker, currently drinking 1 pint of tequila per day ?CIWA protocol initiated with ativan ?B1/FA/MVI ? ?  AKI ?Hyperkalemia ?Cr 1.20->2.22->2.39 ?K+ 5.3->5.0 ?BMP monitoring ?Will consult nephrology if Cr continues to rise ? ?Other Stroke Risk Factors ? ? ?Other Active Problems ?Acetylsalicylic Acid Overuse ?Reportedly taking 812mg  of ASA daily (2 325mg  and 2 81mg  pills) ? ?Hospital day # 2 ? ?Patient seen and examined by NP/APP with MD. MD to update note as needed.  ? ?Cortney E , MSN, AGACNP-BC ?[facility] ?See Amion for schedule and pager information ?12/23/2021 1:40 PM ?  ?ATTENDING NOTE: ?I reviewed above note and agree with the assessment and plan. Pt was seen and examined.  ? ?RN and wife at bedside.  Patient continued to have respiratory distress overnight, wheezing, lethargic.  CCM consulted, put on BiPAP and symptoms improved and tolerating well.  Still has right hemiparesis, however able to follow simple commands and eyes open to voice.  Creatinine elevated significantly from  yesterday, DC lisinopril.  Will consider nephrology consult if needed.  Does not feel patient safe to swallow, currently NPO.  TG elevated, change coverage to Cardene.  Continue ICU care. ? ?For detailed assessme

## 2021-12-23 NOTE — Progress Notes (Signed)
Pharmacy Antibiotic Note ? ?Ronnie Jones is a 71 y.o. male admitted on 12/21/2021 with pneumonia.  Pharmacy has been consulted for Unasyn dosing. ? ?New onset AKI: Scr 1.2>>2.2 ? ?Plan: ?Unasyn 3gm q12hr ?Will monitor for acute changes in renal function and adjust as needed ?F/u cultures results and de-escalate as appropriate ? ? ?Height: 5\' 7"  (170.2 cm) ?Weight: 77.1 kg (170 lb) ?IBW/kg (Calculated) : 66.1 ? ?Temp (24hrs), Avg:98.5 ?F (36.9 ?C), Min:97.6 ?F (36.4 ?C), Max:99.2 ?F (37.3 ?C) ? ?Recent Labs  ?Lab 12/21/21 ?1737 12/21/21 ?1745 12/23/21 ?12/25/21  ?WBC 6.8  --  10.7*  ?CREATININE 1.36* 1.20 2.22*  ?  ?Estimated Creatinine Clearance: 29.8 mL/min (A) (by C-G formula based on SCr of 2.22 mg/dL (H)).   ? ?Allergies  ?Allergen Reactions  ? Codeine Nausea Only  ? ?Thank you for allowing pharmacy to be a part of this patient?s care. ? ?[id number], PharmD ?Clinical Pharmacist ? ?Please check AMION for all [facility] Pharmacy numbers ?After 10:00 PM, call Main Pharmacy [phone] ? ? ?

## 2021-12-23 NOTE — Progress Notes (Signed)
Pt placed on BIPAP per MD order. Pt is tolerating well at this time.  ?

## 2021-12-23 NOTE — Progress Notes (Signed)
Initial Nutrition Assessment ? ?DOCUMENTATION CODES:  ? ?Not applicable ? ?INTERVENTION:  ? ?Once diet advanced will supplement with Ensure TID (pt had been agreeable prior to NPO status) ? ?If pt requires intubation recommend initiate ?Osmolite 1.5 @ 20 ml/hr and increase by 10 ml every 8 hours to goal rate of 50 ml/hr ? ?90 ml ProSource TF BID ? ?Provides: 1960 kcal, 119 grams protein, and 912 ml free water.  ? ?If started on TF recommend monitor magnesium and phosphorus every 12 hours x 4 occurrences, MD to replete as needed, as pt is at risk for refeeding syndrome given ETOH abuse. ? ? ?Pt at very high risk of micronutrient deficiencies, will order Vitamin A, C, D, B6, B12, Zinc and Copper.   ? ?NUTRITION DIAGNOSIS:  ? ?Increased nutrient needs related to chronic illness (COPD) as evidenced by estimated needs. ? ?GOAL:  ? ?Patient will meet greater than or equal to 90% of their needs ? ?MONITOR:  ? ?Diet advancement ? ?REASON FOR ASSESSMENT:  ? ?Rounds ?  ? ?ASSESSMENT:  ? ?Pt with PMH Of COPD, HTN, tobacco abuse, ETOH abuse (1 pint of tequila daily) admitted with L ICH presumed due to uncontrolled HTN.  ? ?Pt discussed during ICU rounds and with RN and MD. Pt more obtunded today and is now requiring BiPAP support. Per CCM may require intubation.  ? ?Wife at bedside and provides hx. She reports that pt has been losing weight for the last year, she has noticed that he eats less. He also had all of his upper teeth pulled and despite having well-fitting dentures pt does not like them and won't eat with them also causing decreased intake. Pt eats 2 meals per day. Breakfast is egg, grits or hash browns, and bacon.  They usually have take out for their last meal of the day. He usually only eats about 1/2 of the plate of food that he gets. He loves fried foods and milk. He typically begins drinking tequila at 4:30 after their evening meal. He drinks until he passes out on the couch. He drinks a pint of tequila per day.  Pt has not required O2 at home but per wife he is unable to get around very well due to SOB.  ?Noted that pt had been previously prescribed thiamine, folic acid, and Vitamin D. Per wife he does not take these at home.  ? ? ?Medications reviewed and include: folic acid, lasix, SSI, solumedrol, MVI with minerals, protoinx, phenobarbital, senokot-s ?Cardene ?Thiamine  ? ?Labs reviewed: TG: 499 ?A1C: 6.1 ?CBG's: 129-157 ? ?UOP: 580 ml  ? ? ?NUTRITION - FOCUSED PHYSICAL EXAM: ? ?Flowsheet Row Most Recent Value  ?Orbital Region No depletion  ?Upper Arm Region Mild depletion  ?Thoracic and Lumbar Region No depletion  ?Buccal Region No depletion  ?Temple Region No depletion  ?Clavicle Bone Region No depletion  ?Clavicle and Acromion Bone Region No depletion  ?Scapular Bone Region No depletion  ?Dorsal Hand Moderate depletion  ?Patellar Region Severe depletion  ?Anterior Thigh Region Severe depletion  ?Posterior Calf Region Severe depletion  ?Edema (RD Assessment) None  ?Hair Reviewed  ?Eyes Reviewed  ?Mouth Reviewed  ?Skin Reviewed  ?Nails Reviewed  [pale nail beds]  ? ?  ? ? ?Diet Order:   ?Diet Order   ? ?       ?  Diet NPO time specified  Diet effective now       ?  ? ?  ?  ? ?  ? ? ?  EDUCATION NEEDS:  ? ?Not appropriate for education at this time ? ?Skin:  Skin Assessment: Reviewed RN Assessment ? ?Last BM:  unknown ? ?Height:  ? ?Ht Readings from Last 1 Encounters:  ?12/21/21 5\' 7"  (1.702 m)  ? ? ?Weight:  ? ?Wt Readings from Last 1 Encounters:  ?12/21/21 77.1 kg  ? ? ?BMI:  Body mass index is 26.63 kg/m?. ? ?Estimated Nutritional Needs:  ? ?Kcal:  1900-2100 ? ?Protein:  110-125 grams ? ?Fluid:  > 1.9 L/day ? ?Lockie Pares., RD, LDN, CNSC ?See AMiON for contact information  ? ?

## 2021-12-23 NOTE — Progress Notes (Signed)
Breathing comfortably on BiPAP, 30%. Saturating well, no increased WOB now. Much more comfortable than this morning. Wheezing resolved. Able to open left eye to voice. Nodding to answer questions. RASS -2. Wants to take bipap break. ? ?BP (!) 148/96 (BP Location: Right Arm)   Pulse 88   Temp 98.1 ?F (36.7 ?C) (Axillary)   Resp 18   Ht 5\' 7"  (1.702 m)   Wt 77.1 kg   SpO2 95%   BMI 26.63 kg/m?  ? ?D/w RT-- ok for bipap break since wheezing is improved. If increased WOB, replace BiPAP. Ok to con't PRN. Recommend using QHS if possible. ? ? , DO 12/23/21 4:35 PM ?[facility] ? ?

## 2021-12-23 NOTE — Consult Note (Signed)
? ?NAME:  Ronnie Jones, MRN:  [id number], DOB:  [birth year], LOS: 2 ?ADMISSION DATE:  12/21/2021, CONSULTATION DATE:  12/23/21 ?REFERRING MD:  Stroke Team CHIEF COMPLAINT:  Respiratory Distress  ? ?History of Present Illness:  ?Ronnie Jones is a 71 y.o. male who has a PMH including but not limited to COPD, hypertension, tobacco abuse, alcohol abuse.  He presented on 5/15 as a code stroke with right-sided weakness.  He was found his home sitting in the chair by his wife after she returned from the store.  In ED, CT head demonstrated left subcortical ICH.  He was started on Cleviprex for blood pressure control with goal systolic less than 150.  He was admitted by neurology for further evaluation and management. ? ?He was placed on CIWA for alcohol dependence.  On the morning of 5/17, he was noted to be less responsive than he previously was.  He was also noted to have respite distress with accessory muscle use or paradoxical breathing pattern.  PCCM subsequently called in consultation. ? ?CXR, ABG obtained and pending. On exam, he is altered and minimally responsive. He is bronchospastic bilaterally right-sided greater than left.  He is being administered bronchodilators as well as IV Solu-Medrol.  He is empirically been placed on BiPAP while awaiting ABG for suspected hypercapnia after IV Ativan administration 1mg  at 0700. ? ?Pertinent  Medical History:  ?has ICH (intracerebral hemorrhage) (HCC) on their problem list. ? ?Significant Hospital Events: ?Including procedures, antibiotic start and stop dates in addition to other pertinent events   ?5/15 admit ?5/17 respiratory distress, AMS, PCCM consulted ? ?Interim History / Subjective:  ?On 2L [place], sats high 90s. ?Altered, does not follow commands. Opens eyes to noxious stimuli. Increased work of breathing with abdominal accessory muscle use. ? ?Objective:  ?Blood pressure (!) 147/77, pulse 73, temperature 99.2 ?F (37.3 ?C), temperature source Axillary, resp. rate (!)  29, height 5\' 7"  (1.702 m), weight 77.1 kg, SpO2 94 %. ?   ?   ? ?Intake/Output Summary (Last 24 hours) at 12/23/2021 0934 ?Last data filed at 12/23/2021 0800 ?Gross per 24 hour  ?Intake 1454.49 ml  ?Output 580 ml  ?Net 874.49 ml  ? ?Filed Weights  ? 12/21/21 1700 12/21/21 1833  ?Weight: 77.4 kg 77.1 kg  ? ? ?Examination: ?General: Adult male, altered, in mild respiratory distress. ?Neuro: Altered, does not follow commands. ?HEENT: [place]/AT. Sclerae anicteric. ?Cardiovascular: RRR, no M/R/G.  ?Lungs: Respirations labored. Wheezing bilaterally R > L. Abdominal accessory muscle use ?Abdomen: BS x 4, soft, NT/ND.  ?Musculoskeletal: No edema.  ?Skin: Skin tan and appears thickened throughout. Intact, warm, no rashes. ? ?Labs/imaging personally reviewed:  ?CT head, CA head/neck 5/15 > acute IPH in the left thalamus and medial basal ganglia with IV extension primarily into the left lateral ventricle.  Mild surrounding edema and local mass effect without significant midline shift. ?CT head 5/1 7 > unchanged size of left thalamic IPH.  No hydrocephalus or ventricular entrapment. ?Echo 5/16 > EF 65-70%, G1DD. ? ?Assessment & Plan:  ? ?Left IPH - presumed 2/2 uncontrolled HTN. Repeat CT head stable. ?- Neurology following / managing. ?- BP control. ?- MRI pending. ? ?Respiratory insufficiency - suspect 2/2 Ativan administration earlier this AM.  ABG reassuring and repeat CT head this AM stable. No additional meds given that would explain his change in mental status. ?Hx COPD (Spirometry from [facility] in 2019 with ratio 47%), tobacco dependence, lung nodule (Chest CTA 2019 with 5 and 7mm RUL nodules, recommended  f/u in July 2021). ?Tobacco dependence. ?- BiPAP empirically for now given his work of breathing. ?- High risk for intubation, explained to wife. ?- Avoid sedative meds. ?- BD's, steroids. ?- Needs follow up chest imaging once over acute issues. ? ?Concern for aspiration PNA. ?- Empiric unasyn for now. ?- Check PCT. ? ?HTN  emergency - likely uncontrolled HTN at baseline. ?HLD. ?- Change Cleviprex to Nicardipine given hypertriglyceridemia.  ?- Add daily Lasix while getting Nicardidpine to avoid excessive volume. ?- Holding Amlodipine, Atenolol, Lisinopril as have changed him to NPO given respiratory distress this AM. ?- Continue statin when able to take PO. ? ?Hx EtOH dependence. ?- Continue Phenobarbital, Ativan per CIWA. ? ?Acute on likely CKD - exacerbated by ACE, lasix, drop in BP. ?- Avoid nephrotoxins. ?- Follow BMP, UOP. ? ?Hx DM. ?- SSI. ? ? ?Best practice (evaluated daily):  ?Diet/type: NPO ?DVT prophylaxis: not indicated ?GI prophylaxis: N/A ?Lines: N/A ?Foley:  N/A ?Code Status:  full code ?Last date of multidisciplinary goals of care discussion: Discussed with wife and RN at bedside 5/17. ? ?Labs   ?CBC: ?Recent Labs  ?Lab 12/21/21 ?1737 12/21/21 ?1745 12/23/21 ?1610  ?WBC 6.8  --  10.7*  ?NEUTROABS 4.6  --   --   ?HGB 14.8 14.3 14.2  ?HCT 43.9 42.0 41.4  ?MCV 89.0  --  89.8  ?PLT 286  --  290  ? ? ?Basic Metabolic Panel: ?Recent Labs  ?Lab 12/21/21 ?1737 12/21/21 ?1745 12/23/21 ?9604  ?NA 135 136 134*  ?K 4.8 4.4 5.3*  ?CL 103 106 105  ?CO2 22  --  17*  ?GLUCOSE 129* 118* 110*  ?BUN 32* 30* 29*  ?CREATININE 1.36* 1.20 2.22*  ?CALCIUM 8.9  --  8.8*  ? ?GFR: ?Estimated Creatinine Clearance: 29.8 mL/min (A) (by C-G formula based on SCr of 2.22 mg/dL (H)). ?Recent Labs  ?Lab 12/21/21 ?1737 12/23/21 ?0607  ?WBC 6.8 10.7*  ? ? ?Liver Function Tests: ?Recent Labs  ?Lab 12/21/21 ?1737  ?AST 22  ?ALT 17  ?ALKPHOS 51  ?BILITOT 0.2*  ?PROT 5.8*  ?ALBUMIN 3.1*  ? ?No results for input(s): LIPASE, AMYLASE in the last 168 hours. ?No results for input(s): AMMONIA in the last 168 hours. ? ?ABG ?   ?Component Value Date/Time  ? TCO2 20 (L) 12/21/2021 1745  ?  ? ?Coagulation Profile: ?Recent Labs  ?Lab 12/21/21 ?1737  ?INR 0.9  ? ? ?Cardiac Enzymes: ?No results for input(s): CKTOTAL, CKMB, CKMBINDEX, TROPONINI in the last 168  hours. ? ?HbA1C: ?Hgb A1c MFr Bld  ?Date/Time Value Ref Range Status  ?12/21/2021 05:37 PM 6.1 (H) 4.8 - 5.6 % Final  ?  Comment:  ?  (NOTE) ?Pre diabetes:          5.7%-6.4% ? ?Diabetes:              >6.4% ? ?Glycemic control for   <7.0% ?adults with diabetes ?  ? ? ?CBG: ?Recent Labs  ?Lab 12/22/21 ?1130 12/22/21 ?1508 12/22/21 ?1937 12/22/21 ?2140 12/23/21 ?0754  ?GLUCAP 162* 112* 144* 149* 129*  ? ? ?Review of Systems:   ?Unable to obtain as pt is encephalopathic. ? ?Past Medical History:  ?He,  has a past medical history of CKD (chronic kidney disease), COPD (chronic obstructive pulmonary disease) (HCC), and Hypertension.  ? ?Surgical History:  ?History reviewed. No pertinent surgical history.  ? ?Social History:  ? reports that he has been smoking cigarettes. He has a 80.00 pack-year smoking history. He  has never used smokeless tobacco. He reports current alcohol use. He reports that he does not use drugs.  ? ?Family History:  ?His family history is not on file.  ? ?Allergies ?Allergies  ?Allergen Reactions  ? Codeine Nausea Only  ?  ? ?Home Medications  ?Prior to Admission medications   ?Medication Sig Start Date End Date Taking? Authorizing Provider  ?acetaminophen (TYLENOL) 500 MG tablet Take 500 mg by mouth every 6 (six) hours as needed for mild pain or fever.   Yes [provider]  ?albuterol (VENTOLIN HFA) 108 (90 Base) MCG/ACT inhaler Inhale 2 puffs into the lungs every 4 (four) hours as needed. 12/01/21  Yes [provider]  ?amLODipine (NORVASC) 10 MG tablet Take 10 mg by mouth daily. 12/06/21  Yes [provider]  ?aspirin 325 MG tablet Take 650 mg by mouth daily.   Yes [provider]  ?aspirin EC 81 MG tablet Take 162 mg by mouth daily. Swallow whole.   Yes [provider]  ?atenolol (TENORMIN) 50 MG tablet Take 50 mg by mouth daily. 10/01/21  Yes [provider]  ?lisinopril (ZESTRIL) 20 MG tablet Take 20 mg by mouth daily. 12/06/21  Yes [provider]  ?metFORMIN (GLUCOPHAGE) 500 MG tablet Take 500 mg by mouth 2 (two) times daily. 12/06/21  Yes [provider]  ?Vitamin D, Ergocalciferol, (DRISDOL) 1.25 MG (50000 UNIT) CAPS capsule Take 50,0

## 2021-12-24 DIAGNOSIS — G9340 Encephalopathy, unspecified: Secondary | ICD-10-CM | POA: Diagnosis not present

## 2021-12-24 DIAGNOSIS — R0603 Acute respiratory distress: Secondary | ICD-10-CM | POA: Diagnosis not present

## 2021-12-24 DIAGNOSIS — J441 Chronic obstructive pulmonary disease with (acute) exacerbation: Secondary | ICD-10-CM | POA: Diagnosis not present

## 2021-12-24 DIAGNOSIS — J9601 Acute respiratory failure with hypoxia: Secondary | ICD-10-CM | POA: Diagnosis not present

## 2021-12-24 DIAGNOSIS — N179 Acute kidney failure, unspecified: Secondary | ICD-10-CM | POA: Diagnosis not present

## 2021-12-24 DIAGNOSIS — I610 Nontraumatic intracerebral hemorrhage in hemisphere, subcortical: Secondary | ICD-10-CM | POA: Diagnosis not present

## 2021-12-24 LAB — CBC
HCT: 41.7 % (ref 39.0–52.0)
Hemoglobin: 14.4 g/dL (ref 13.0–17.0)
MCH: 30.3 pg (ref 26.0–34.0)
MCHC: 34.5 g/dL (ref 30.0–36.0)
MCV: 87.8 fL (ref 80.0–100.0)
Platelets: 244 10*3/uL (ref 150–400)
RBC: 4.75 MIL/uL (ref 4.22–5.81)
RDW: 13.5 % (ref 11.5–15.5)
WBC: 7.3 10*3/uL (ref 4.0–10.5)
nRBC: 0 % (ref 0.0–0.2)

## 2021-12-24 LAB — BASIC METABOLIC PANEL
Anion gap: 9 (ref 5–15)
BUN: 31 mg/dL — ABNORMAL HIGH (ref 8–23)
CO2: 20 mmol/L — ABNORMAL LOW (ref 22–32)
Calcium: 8.7 mg/dL — ABNORMAL LOW (ref 8.9–10.3)
Chloride: 109 mmol/L (ref 98–111)
Creatinine, Ser: 2.28 mg/dL — ABNORMAL HIGH (ref 0.61–1.24)
GFR, Estimated: 30 mL/min — ABNORMAL LOW (ref >60)
Glucose, Bld: 133 mg/dL — ABNORMAL HIGH (ref 70–99)
Potassium: 3.9 mmol/L (ref 3.5–5.1)
Sodium: 138 mmol/L (ref 135–145)

## 2021-12-24 LAB — PROCALCITONIN: Procalcitonin: <0.1 ng/mL

## 2021-12-24 LAB — VITAMIN D 25 HYDROXY (VIT D DEFICIENCY, FRACTURES): Vit D, 25-Hydroxy: 17.96 ng/mL — ABNORMAL LOW (ref 30–100)

## 2021-12-24 LAB — VITAMIN B12: Vitamin B-12: 703 pg/mL (ref 180–914)

## 2021-12-24 LAB — GLUCOSE, CAPILLARY
Glucose-Capillary: 111 mg/dL — ABNORMAL HIGH (ref 70–99)
Glucose-Capillary: 133 mg/dL — ABNORMAL HIGH (ref 70–99)
Glucose-Capillary: 172 mg/dL — ABNORMAL HIGH (ref 70–99)
Glucose-Capillary: 123 mg/dL — ABNORMAL HIGH (ref 70–99)
Glucose-Capillary: 155 mg/dL — ABNORMAL HIGH (ref 70–99)

## 2021-12-24 MED ORDER — ORAL CARE MOUTH RINSE
15.0000 mL | Freq: Two times a day (BID) | OROMUCOSAL | Status: DC
  Administered 2021-12-24 – 2021-12-31 (×15): 15 mL via OROMUCOSAL

## 2021-12-24 MED ORDER — VITAMIN D (ERGOCALCIFEROL) 1.25 MG (50000 UNIT) PO CAPS
50000.0000 [IU] | ORAL_CAPSULE | ORAL | Status: DC
  Filled 2021-12-24: qty 1

## 2021-12-24 MED ORDER — CHLORHEXIDINE GLUCONATE 0.12 % MT SOLN
15.0000 mL | Freq: Two times a day (BID) | OROMUCOSAL | Status: DC
  Administered 2021-12-24 – 2021-12-31 (×15): 15 mL via OROMUCOSAL
  Filled 2021-12-24 (×13): qty 15

## 2021-12-24 NOTE — Progress Notes (Addendum)
STROKE TEAM PROGRESS NOTE   INTERVAL HISTORY Patient is seen in his room with his wife at the bedside.. He has been hemodynamically stable and his respiratory status has improved.  Respirations are unlabored on supplemental O2 via [place].  Patient is completely unable to move RUE today.  Vitals:   12/24/21 1000 12/24/21 1100 12/24/21 1200 12/24/21 1300  BP: (!) 159/78 124/80 136/72 (!) 149/88  Pulse: 88 83 72 82  Resp: 18 18 16 17   Temp:   98.5 F (36.9 C)   TempSrc:      SpO2: 90% 94% 93% 93%  Weight:      Height:       CBC:  Recent Labs  Lab 12/21/21 1737 12/21/21 1745 12/23/21 0607 12/23/21 1016 12/24/21 0417  WBC 6.8  --  10.7*  --  7.3  NEUTROABS 4.6  --   --   --   --   HGB 14.8   < > 14.2 14.3 14.4  HCT 43.9   < > 41.4 42.0 41.7  MCV 89.0  --  89.8  --  87.8  PLT 286  --  290  --  244   < > = values in this interval not displayed.    Basic Metabolic Panel:  Recent Labs  Lab 12/23/21 1511 12/24/21 0417  NA 136 138  K 5.0 3.9  CL 108 109  CO2 15* 20*  GLUCOSE 159* 133*  BUN 30* 31*  CREATININE 2.39* 2.28*  CALCIUM 8.8* 8.7*    Lipid Panel:  Recent Labs  Lab 12/21/21 1737 12/23/21 0607  CHOL 321*  --   TRIG 339* 499*  HDL 61  --   CHOLHDL 5.3  --   VLDL 68*  --   LDLCALC 192*  --     HgbA1c:  Recent Labs  Lab 12/21/21 1737  HGBA1C 6.1*    Urine Drug Screen:  Recent Labs  Lab 12/22/21 0757  LABOPIA NONE DETECTED  COCAINSCRNUR NONE DETECTED  LABBENZ NONE DETECTED  AMPHETMU NONE DETECTED  THCU NONE DETECTED  LABBARB NONE DETECTED     Alcohol Level  Recent Labs  Lab 12/21/21 2032  ETH <10     IMAGING past 24 hours No results found.  PHYSICAL EXAM  Physical Exam  Constitutional: Appears well-developed and well-nourished.  Cardiovascular: Normal rate and regular rhythm.  Respiratory: Unlabored respirations on supplemental O2 via [place]  Neuro: Mental Status: Patient is alert and oriented x2 Cranial Nerves: II: Visual Fields  are full. Pupils are equal, round, and reactive to light.   III,IV, VI: EOMI without ptosis or diploplia.  V: Facial sensation symmetrical VII: Smile is symmetrical VIII: Hearing is intact to voice Motor: Tone is normal. Bulk is normal.  Left hemibody 5/5, follows commands Right UE: 0/5 Right LE: 3/5, follows commands Sensory: Sensation is symmetric to light touch in the arms and legs.  Cerebellar: FTN intact on left    ASSESSMENT/PLAN Mr. Rami Budhu is a 71 y.o. male with history of ETOH use (at least 1 pint per day), COPD, HTN, tobacco use (2-3 packs per day), and vitamin D deficiency presenting with right sided weakness, right facial droop. CIWA protocol initiated with ativan, last drink 5/14 evening. MRI pending. CTA shows severe stenosis. PT, OT pending. MRI pending. Dysphgia 3 diet ordered per SLP. PO medications initiated but held due to respiratory distress, will need repeat swallow study.  Patient placed on BiPAP yesterday, but respiratory status has improved today.  Plan to use  BiPAP at night only.  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 Mild surrounding edema and local mass effect without significant midline shift. 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 Bilateral carotid bifurcation atherosclerosis without greater than 50% stenosis. Follow up CT 5/17 Unchanged size of left thalamic IPH MRI not able to perform due to respiratory distress 2D Echo EF 65-70% LDL 192 HgbA1c 6.1 VTE prophylaxis - SCDs Reportedly taking 812mg  of ASA daily for pain prior to admission, now on No antithrombotic due to ICH Therapy recommendations:  Pending Disposition:   Pending  Respiratory distress/failure COPD Baseline wheezing at home due to COPD Worsening wheezing this admission - lasix 40 x 1, breathing treatment PRN nebs ordered CCM on board, appreciate assistance Currently off BiPAP, will use at night only Also manual Wheezing much improved 5/18  Hypertensive emergency Home meds:  Lisinopril 20mg , amlodipine 10mg , atenolol 50mg  IV cleviprex -> change to nicardipine due to elevated triglycerides Resume home amlodipine, atenolol  Added hydralazine 50->100 every 8h D/c lisinopril due to AKI Long-term BP goal normotensive BP goal less than 160  Hyperlipidemia Home meds:  Crestor 20mg  (not taking) LDL 192, goal < 70 Consider Crestor 40 at discharge  Diabetes type II Controlled Home meds:  Metformin HgbA1c 6.1, goal < 7.0 CBGs SSI Close PCP follow-up  Dysphagia SLP following Dysphagia 3 diet, thin liquid -> now NPO due to respiratory distress Hold off BP po meds for now  Tobacco abuse Current smoker, smoking 2-3ppd Smoking cessation counseling provided Nicotine patch provided Pt is willing to quit  Alcohol abuse Heavy drinker, currently drinking 1 pint of tequila per day CIWA protocol initiated with ativan B1/FA/MVI  AKI Hyperkalemia Cr 1.20->2.22->2.39->2.28 K+ 5.3->5.0->3.9 BMP monitoring Will consult nephrology if Cr continues to rise  Other Stroke Risk Factors   Other Active Problems Acetylsalicylic Acid Overuse Reportedly taking 812mg  of ASA daily (2 325mg  and 2 81mg  pills)  Hospital day # 3  Patient seen and examined by NP/APP with MD. MD to update note as needed.   Cortney E Ernestina Columbiae La Torre , MSN, AGACNP-BC [facility] See Amion for schedule and pager information 12/24/2021 1:30 PM  ATTENDING NOTE: I reviewed above note and agree with the assessment and plan. Pt was seen and examined.   Wife at the bedside, pt breathing much better, minimal wheezing today, he looks comfortable in bed. Still  has left hemiparesis. Cre stable from yesterday.    On exam, patient awake, alert, eyes open, orientated to age, place, time and people. No aphasia, paucity of speech, following all simple commands, mild to moderate dysarthria. Able to name and repeat in dysarthric voice with mild psychomotor slowing. No gaze palsy, tracking bilaterally, visual field full, PERRL. Right facial droop. Tongue midline. RUE flaccid today, RLE 3-/5 proximal and 0/5 distal toe DF. LUE and LLE at least 4/5. Sensation symmetrical bilaterally sujectively, left FTN intact, gait not tested.   Appreciate CCM assistance.  We will continue ICU monitoring for respiratory status.  BiPAP as needed at night. BP stable, still on low-dose Cardene.  AKI stable so far.  Still has dysphagia, currently passed swallow on dysphagia 1 and honey thick liquid.  PT/OT recommend CIR.  For detailed assessment and plan, please refer  to above as I have made changes wherever appropriate.   Marvel Plan, MD PhD Stroke Neurology 12/24/2021 5:24 PM  This patient is critically ill due to ICH and IVH, respiratory failure, heavy alcohol use, chronic smoker, hypertensive emergency, dysphagia and at significant risk of neurological worsening, death form hematoma expansion, cerebral edema, brain herniation, renal failure, respiratory failure, alcohol withdrawal DT, hypertensive encephalopathy. This patient's care requires constant monitoring of vital signs, hemodynamics, respiratory and cardiac monitoring, review of multiple databases, neurological assessment, discussion with family, other specialists and medical decision making of high complexity. I spent 40 minutes of neurocritical care time in the care of this patient.  I discussed with Dr. Chestine Spore CCM. I had long discussion with patient and wife at bedside, updated pt current condition, treatment plan and potential prognosis, and answered all the questions.  They expressed understanding and appreciation.        To contact Stroke Continuity provider, please refer to [email or web address]. After hours, contact General Neurology

## 2021-12-24 NOTE — Progress Notes (Signed)
Speech Language Pathology Treatment: Dysphagia  Patient Details Name: Ronnie Jones MRN: [id number] DOB: 05/28/1951 Today's Date: 12/24/2021 Time: 1884-1660 SLP Time Calculation (min) (ACUTE ONLY): 24 min  Assessment / Plan / Recommendation Clinical Impression  Pt seen for dysphagia f/u d/t altered neuro status/increased WOB as noted by chart review and Bipap requirement overnight.  Pt with overt s/s of aspiration noted during intake of thin (via cup/tsp)/nectar-thickened liquids with immediate cough observed and delayed throat clearing with honey-thickened liquids via cup.  Tsp amounts of honey-thickened liquids eliminated delayed cough response.  Pt with impaired mastication during intake of regular consistency d/t limited dentition and R oral weakness/anterior loss during consumption of various consistencies.  Pt with impaired respiratory effort d/t COPD at baseline paired with new ICH placing him at moderate risk for aspiration without swallowing strategies implemented for safety during swallowing.  He exhibited low vocal intensity and a weak cough response during session as well.  Pt would benefit from downgraded diet of Dysphagia 1 (puree)/honey-thickened liquids provided by tsp with FULL supervision during all intake for implementation of swallowing strategies.  MBS recommended to determine safest diet and any maneuvers to assist with PO intake.  Family(wife)/pt in agreement with plan.  ST will continue f/u during acute stay for dysphagia/cog rehab.     HPI HPI: Ronnie Jones is a 71 year old gentleman who presented via EMS as a Code Stroke after he was found at home with right sided weakness, sitting in his chair, by his wife. She had gone out to the store and returned to find him with right facial droop, right arm and leg weakness, unable to rise from chair. Head CT 5/15: "1. 3 x 3 x 3.6 cm left thalamocapsular hemorrhage with  intraventricular extension, slightly greater in height but otherwise   unchanged.  2. 3 mm left-to-right midline shift, with slight increased  intraventricular hemorrhagic content than previously."  Pt with a past medical history of ETOH use (at least 1 pint per day), COPD, HTN, tobacco use (2-3 packs per day), and vitamin D deficiency; BSE completed and noted mild oral dysphagia with a diet of D3/thin recommended.  Pt with requirement of Bipap overnight d/t increased WOB.  CXR on 12/23/21 negative for acute changes.      SLP Plan  MBS;New goals to be determined pending instrumental study      Recommendations for follow up therapy are one component of a multi-disciplinary discharge planning process, led by the attending physician.  Recommendations may be updated based on patient status, additional functional criteria and insurance authorization.    Recommendations  Diet recommendations: Honey-thick liquid;Dysphagia 1 (puree) Liquids provided via: Teaspoon Medication Administration: Crushed with puree Supervision: Patient able to self feed;Full supervision/cueing for compensatory strategies;Staff to assist with self feeding Compensations: Slow rate;Small sips/bites;Minimize environmental distractions;Monitor for anterior loss;Lingual sweep for clearance of pocketing Postural Changes and/or Swallow Maneuvers: Seated upright 90 degrees                Oral Care Recommendations: Oral care BID;Staff/trained caregiver to provide oral care Follow Up Recommendations: Acute inpatient rehab (3hours/day) Assistance recommended at discharge: Frequent or constant Supervision/Assistance SLP Visit Diagnosis: Dysphagia, oropharyngeal phase (R13.12) Plan: MBS;New goals to be determined pending instrumental study           Tressie Stalker, M.S., CCC-SLP  12/24/2021, 1:29 PM

## 2021-12-24 NOTE — Progress Notes (Signed)
Nutrition Follow-up  DOCUMENTATION CODES:   Not applicable  INTERVENTION:   Encourage PO intake of Dysphagia diet.  Remains on MVI, Vitamin D, folic acid, and thiamine   If PO intake limited due to severe COPD and dysphagia would recommend supplemental TF:  Osmolite 1.5 @ 20 ml/hr and increase by 10 ml every 8 hours to goal rate of 50 ml/hr  90 ml ProSource TF BID  Provides: 1960 kcal, 119 grams protein, and 912 ml free water.   If started on TF recommend monitor magnesium and phosphorus every 12 hours x 4 occurrences, MD to replete as needed, as pt is at risk for refeeding syndrome given ETOH abuse.   Pt at very high risk of micronutrient deficiencies, Vitamin A, C, B6, Zinc and Copper pending.   NUTRITION DIAGNOSIS:   Increased nutrient needs related to chronic illness (COPD) as evidenced by estimated needs.  GOAL:   Patient will meet greater than or equal to 90% of their needs  MONITOR:   Diet advancement  REASON FOR ASSESSMENT:   Rounds    ASSESSMENT:   Pt with PMH Of COPD, HTN, tobacco abuse, ETOH abuse (1 pint of tequila daily) admitted with L ICH presumed due to uncontrolled HTN.   Pt discussed during ICU rounds and with RN and MD. Pt now off BiPAP and able to pass a swallow eval, concern that severe COPD will limit intake.   5/17 off/on BiPAP 5/18 on 2 L [place]; passed swallow eval started on dysphagia 1 with honey thickened liquids    Medications reviewed and include: folic acid, lasix, SSI, solumedrol, MVI with minerals, protoinx, phenobarbital, senokot-s, vitamin D Cardene  Thiamine   Labs reviewed: TG: 499 A1C: 6.1 CBG's: 111-157  Vitamin D: 17.96 Low Vitamin B12: 703 WNL   UOP: 755 ml    Diet Order:   Diet Order             DIET - DYS 1 Room service appropriate? Yes; Fluid consistency: Honey Thick  Diet effective now                   EDUCATION NEEDS:   Not appropriate for education at this time  Skin:  Skin Assessment:  Reviewed RN Assessment  Last BM:  unknown  Height:   Ht Readings from Last 1 Encounters:  12/21/21 5\' 7"  (1.702 m)    Weight:   Wt Readings from Last 1 Encounters:  12/21/21 77.1 kg    BMI:  Body mass index is 26.63 kg/m.  Estimated Nutritional Needs:   Kcal:  1900-2100  Protein:  110-125 grams  Fluid:  > 1.9 L/day  12/23/21., RD, LDN, CNSC See AMiON for contact information

## 2021-12-24 NOTE — Progress Notes (Signed)
Physical Therapy Treatment Patient Details Name: Ronnie Jones MRN: [id number] DOB: Oct 30, 1950 Today's Date: 12/24/2021   History of Present Illness Ronnie Jones is a 71 year old gentleman who presented via EMS as a Code Stroke after he was found at home with right sided weakness, sitting in his chair, by his wife. CT showed L subcortical ICH. PMH:  ETOH use (at least 1 pint per day), COPD, HTN, tobacco use (2-3 packs per day), and vitamin D deficiency.    PT Comments    Pt tolerates treatment well, demonstrating improvement in his awareness and ability to correct R lateral lean. Pt demonstrates RLE activation this session with knee extension. Pt stands with more erect posture but remains at a high risk for falls due to R weakness and instability. Pt will benefit from continued aggressive mobilization in an effort to restore his prior level of function. PT continues to recommend AIR admission.  Recommendations for follow up therapy are one component of a multi-disciplinary discharge planning process, led by the attending physician.  Recommendations may be updated based on patient status, additional functional criteria and insurance authorization.  Follow Up Recommendations  Acute inpatient rehab (3hours/day)     Assistance Recommended at Discharge Frequent or constant Supervision/Assistance  Patient can return home with the following Two people to help with walking and/or transfers;Two people to help with bathing/dressing/bathroom;Assistance with feeding;Assistance with cooking/housework;Direct supervision/assist for medications management;Direct supervision/assist for financial management;Assist for transportation;Help with stairs or ramp for entrance   Equipment Recommendations  Wheelchair (measurements PT);Wheelchair cushion (measurements PT);BSC/3in1;Other (comment) (hemi walker)    Recommendations for Other Services       Precautions / Restrictions Precautions Precautions:  Fall Precaution Comments: watch O2 Restrictions Weight Bearing Restrictions: No     Mobility  Bed Mobility Overal bed mobility: Needs Assistance Bed Mobility: Supine to Sit, Sit to Supine     Supine to sit: Mod assist, HOB elevated Sit to supine: Mod assist, HOB elevated   General bed mobility comments: pt is able to initiate RLE movement toward edge of bed but requires assist to move off edge of bed. PT provides verbal cues for sequencing as well as physical assistance to elevate trunk and pivot hips.    Transfers Overall transfer level: Needs assistance Equipment used: 1 person hand held assist Transfers: Sit to/from Stand Sit to Stand: Max assist           General transfer comment: pt performs 2 sit to stands with PT providing R knee block. Pt with flexed hips and neck initially but is able to extend with verbal cueing. Pt with frequent R lateral lean, able to initiate correction with cues but requires physical assistance to prevent lean    Ambulation/Gait                   Stairs             Wheelchair Mobility    Modified Rankin (Stroke Patients Only) Modified Rankin (Stroke Patients Only) Pre-Morbid Rankin Score: No symptoms Modified Rankin: Severe disability     Balance Overall balance assessment: Needs assistance Sitting-balance support: Single extremity supported, Feet supported Sitting balance-Leahy Scale: Poor Sitting balance - Comments: R lateral lean, pt is able to maintain upright statically with minG but requires min-modA with distraction or movement Postural control: Right lateral lean Standing balance support: Bilateral upper extremity supported Standing balance-Leahy Scale: Poor Standing balance comment: maxA, R lateral lean, knee block  Cognition Arousal/Alertness: Awake/alert Behavior During Therapy: Flat affect Overall Cognitive Status: Impaired/Different from baseline Area of  Impairment: Problem solving                             Problem Solving: Slow processing, Difficulty sequencing          Exercises General Exercises - Lower Extremity Ankle Circles/Pumps: AROM, Left, 5 reps (encourage attempts with RLE) Quad Sets: AROM, Right, 5 reps Gluteal Sets: AROM, Both, 5 reps Short Arc Quad: AROM, Left, 5 reps    General Comments General comments (skin integrity, edema, etc.): pt on 2L [place] VSS with sats in low 90s. PT notes wheezing, pt and spouse report this is chronic from COPD and emphysema      Pertinent Vitals/Pain Pain Assessment Pain Assessment: 0-10 Pain Score: 5  Pain Location: everywhere (pt reports chronic pain) Pain Descriptors / Indicators: Aching Pain Intervention(s): Monitored during session    Home Living                          Prior Function            PT Goals (current goals can now be found in the care plan section) Acute Rehab PT Goals Patient Stated Goal: return home Progress towards PT goals: Progressing toward goals    Frequency    Min 4X/week      PT Plan Current plan remains appropriate    Co-evaluation              AM-PAC PT "6 Clicks" Mobility   Outcome Measure  Help needed turning from your back to your side while in a flat bed without using bedrails?: A Lot Help needed moving from lying on your back to sitting on the side of a flat bed without using bedrails?: A Lot Help needed moving to and from a bed to a chair (including a wheelchair)?: Total Help needed standing up from a chair using your arms (e.g., wheelchair or bedside chair)?: A Lot Help needed to walk in hospital room?: Total Help needed climbing 3-5 steps with a railing? : Total 6 Click Score: 9    End of Session Equipment Utilized During Treatment: Oxygen Activity Tolerance: Patient tolerated treatment well Patient left: in bed;with call bell/phone within reach;with bed alarm set;with family/visitor  present Nurse Communication: Mobility status PT Visit Diagnosis: Unsteadiness on feet (R26.81);Hemiplegia and hemiparesis;Difficulty in walking, not elsewhere classified (R26.2) Hemiplegia - Right/Left: Right Hemiplegia - dominant/non-dominant: Dominant Hemiplegia - caused by: Nontraumatic SAH     Time: 1026-1050 PT Time Calculation (min) (ACUTE ONLY): 24 min  Charges:  $Therapeutic Activity: 23-37 mins                     Zenaida Niece, PT, DPT Acute Rehabilitation Pager: [phone] Office [phone]    Zenaida Niece 12/24/2021, 11:22 AM

## 2021-12-24 NOTE — Plan of Care (Incomplete)
wife at the bedside, pt breathing much better, minimal wheezing today, he looks comfortable in bed. Still has left hemiparesis. Cre stable from yesterday.   Neuro - awake, alert, eyes open, orientated to age, place, time and people. No aphasia, paucity of speech, following all simple commands, mild to moderate dysarthria. Able to name and repeat in dysarthric voice with mild psychomotor slowing. No gaze palsy, tracking bilaterally, visual field full, PERRL. Right facial droop. Tongue midline. RUE flaccid today, RLE 3-/5 proximal and 0/5 distal toe DF. LUE and LLE at least 4/5. Sensation symmetrical bilaterally sujectively, left FTN intact, gait not tested.

## 2021-12-24 NOTE — Progress Notes (Signed)
NAME:  Ronnie Jones, MRN:  [id number], DOB:  Jul 10, 1951, LOS: 3 ADMISSION DATE:  12/21/2021, CONSULTATION DATE:  12/23/21 REFERRING MD:  Stroke Team CHIEF COMPLAINT:  Respiratory Distress   History of Present Illness:  Ronnie Jones is a 71 y.o. male who has a PMH including but not limited to COPD, hypertension, tobacco abuse, alcohol abuse.  He presented on 5/15 as a code stroke with right-sided weakness.  He was found his home sitting in the chair by his wife after she returned from the store.  In ED, CT head demonstrated left subcortical ICH.  He was started on Cleviprex for blood pressure control with goal systolic less than 150.  He was admitted by neurology for further evaluation and management.  He was placed on CIWA for alcohol dependence.  On the morning of 5/17, he was noted to be less responsive than he previously was.  He was also noted to have respite distress with accessory muscle use or paradoxical breathing pattern.  PCCM subsequently called in consultation.  CXR, ABG obtained and pending. On exam, he is altered and minimally responsive. He is bronchospastic bilaterally right-sided greater than left.  He is being administered bronchodilators as well as IV Solu-Medrol.  He is empirically been placed on BiPAP while awaiting ABG for suspected hypercapnia after IV Ativan administration 1mg  at 0700.  Pertinent  Medical History:  has Encephalopathy acute; Respiratory distress; AKI (acute kidney injury) (HCC); and Intracerebral hemorrhage on their problem list.  Significant Hospital Events: Including procedures, antibiotic start and stop dates in addition to other pertinent events   5/15 admit. CT head, CA head/neck 5/15 > acute IPH in the left thalamus and medial basal ganglia with IV extension primarily into the left lateral ventricle.  Mild surrounding edema and local mass effect without significant midline shift. 5/16 Echo 5/16 > EF 65-70%, G1DD. BSSE: mechanical soft diet with thin  liquids by straw with aspiration precautions  recommended  5/17 respiratory distress, AMS, PCCM consulted, placed on NIPPV. Treated for AECOPD and RLL PNA w/ Unasyn, BDs started and scheduled daily solumedrol at 40mg /d Wheezing better by evening rounds. CT head 5/1 7 > unchanged size of left thalamic IPH.  No hydrocephalus or ventricular entrapment. 5/18 off NIPPV. Appropriate. Still w right sided hemiparesis. Still requiring cardene infusion   Interim History / Subjective:  No new issues overnight. Afebrile   Objective:  Blood pressure (Abnormal) 139/99, pulse 91, temperature 97.9 F (36.6 C), temperature source Axillary, resp. rate 17, height 5\' 7"  (1.702 m), weight 77.1 kg, SpO2 95 %.    FiO2 (%):  [30 %-40 %] 30 %   Intake/Output Summary (Last 24 hours) at 12/24/2021 0917 Last data filed at 12/24/2021 0855 Gross per 24 hour  Intake 1071.2 ml  Output 855 ml  Net 216.2 ml   Filed Weights   12/21/21 1700 12/21/21 1833  Weight: 77.4 kg 77.1 kg    Examination: General 70 yom resting in bed. He is in no distress but remains on cardene infusion for HTN  HENT right sided facial droop sp a little slurred Neuro he has normal strength on left, right upper 1/5 RLE a little better. He is currently oriented x 3.  Pulm prolonged exp wheeze. Currently 2 lpm no accessory use  Card RRR Abd soft  Ext warm dry  Gu cl yellow     Assessment & Plan:   Left IPH - presumed 2/2 uncontrolled HTN. Repeat CT head stable. Plan Cont serial neuro checks Goal SBP <  160 w/ long term goal normotensive.  MRI pending at some point Cont PT/OT/rehab focus  Cont statin   HTN emergency - likely uncontrolled HTN at baseline. HLD. Plan Cont Cardene gtt for goal SBP < 160 Add back oral rx as MS & WOB better (assuming passes repeat SLP eval)->was on amlodipine, atenolol and lisinopril (will hold off on adding back the Ace-I given CKD   Acute respiratory failure 2/2 AECOPD and aspiration PNA H/o tobacco  abuse  Hx COPD (Spirometry from [facility] in 2019 with ratio 47%),  Plan Cont scheduled BDs Cont system steroids Day 2 Unasyn  Supplemental oxygen   lung nodule (Chest CTA 2019 with 5 and 10mm RUL nodules, recommended f/u in July 2021). Plan F/u imaging out pt    Hx EtOH dependence. Plan Cont Phenobarb taper   Acute on likely CKD - exacerbated by ACE, lasix, drop in BP. - renal fxn stable Plan Renal dose meds  Hx DM. Plan Ssi    Best practice (evaluated daily):  Diet/type: NPO DVT prophylaxis: SCD GI prophylaxis: N/A Lines: N/A Foley:  N/A Code Status:   full code Last date of multidisciplinary goals of care discussion: Discussed with wife and RN at bedside 5/17.   Critical care time: NA   Simonne Martinet ACNP-BC [facility] Pulmonary/Critical Care Pager # [phone] OR # [phone] if no answer

## 2021-12-24 NOTE — TOC CAGE-AID Note (Signed)
Transition of Care [facility]) - CAGE-AID Screening   Patient Details  Name: Ronnie Jones MRN: [id number] Date of Birth: [birth year]  Transition of Care [facility]) CM/SW Contact:    Manuel Dall C Tarpley-Carter, LCSWA Phone Number: 12/24/2021, 11:31 AM   Clinical Narrative: Pt is unable to participate in Cage Aid.  CSW will assess at a better time.   Leeland Lovelady Tarpley-Carter, MSW, LCSW-A Pronouns:  She/Her/Hers [facility] Health[facility]itions of Care Clinical Social Worker Direct Number:  [phone] [email or web address]  CAGE-AID Screening: Substance Abuse Screening unable to be completed due to: : Patient unable to participate

## 2021-12-25 ENCOUNTER — Inpatient Hospital Stay (HOSPITAL_COMMUNITY): Payer: Medicare HMO

## 2021-12-25 DIAGNOSIS — J9601 Acute respiratory failure with hypoxia: Secondary | ICD-10-CM | POA: Insufficient documentation

## 2021-12-25 DIAGNOSIS — J441 Chronic obstructive pulmonary disease with (acute) exacerbation: Secondary | ICD-10-CM | POA: Diagnosis not present

## 2021-12-25 DIAGNOSIS — F10931 Alcohol use, unspecified with withdrawal delirium: Secondary | ICD-10-CM

## 2021-12-25 DIAGNOSIS — E8721 Acute metabolic acidosis: Secondary | ICD-10-CM

## 2021-12-25 DIAGNOSIS — J9600 Acute respiratory failure, unspecified whether with hypoxia or hypercapnia: Secondary | ICD-10-CM

## 2021-12-25 DIAGNOSIS — I161 Hypertensive emergency: Secondary | ICD-10-CM | POA: Diagnosis not present

## 2021-12-25 DIAGNOSIS — Z720 Tobacco use: Secondary | ICD-10-CM

## 2021-12-25 DIAGNOSIS — J690 Pneumonitis due to inhalation of food and vomit: Secondary | ICD-10-CM

## 2021-12-25 DIAGNOSIS — E119 Type 2 diabetes mellitus without complications: Secondary | ICD-10-CM

## 2021-12-25 DIAGNOSIS — R1310 Dysphagia, unspecified: Secondary | ICD-10-CM

## 2021-12-25 DIAGNOSIS — I610 Nontraumatic intracerebral hemorrhage in hemisphere, subcortical: Secondary | ICD-10-CM | POA: Diagnosis not present

## 2021-12-25 DIAGNOSIS — E872 Acidosis, unspecified: Secondary | ICD-10-CM

## 2021-12-25 DIAGNOSIS — N1831 Chronic kidney disease, stage 3a: Secondary | ICD-10-CM

## 2021-12-25 DIAGNOSIS — R911 Solitary pulmonary nodule: Secondary | ICD-10-CM

## 2021-12-25 DIAGNOSIS — N179 Acute kidney failure, unspecified: Secondary | ICD-10-CM | POA: Diagnosis not present

## 2021-12-25 DIAGNOSIS — F1010 Alcohol abuse, uncomplicated: Secondary | ICD-10-CM | POA: Diagnosis not present

## 2021-12-25 LAB — GLUCOSE, CAPILLARY
Glucose-Capillary: 127 mg/dL — ABNORMAL HIGH (ref 70–99)
Glucose-Capillary: 134 mg/dL — ABNORMAL HIGH (ref 70–99)
Glucose-Capillary: 200 mg/dL — ABNORMAL HIGH (ref 70–99)
Glucose-Capillary: 56 mg/dL — ABNORMAL LOW (ref 70–99)
Glucose-Capillary: 125 mg/dL — ABNORMAL HIGH (ref 70–99)

## 2021-12-25 LAB — CBC
HCT: 44.1 % (ref 39.0–52.0)
Hemoglobin: 14.8 g/dL (ref 13.0–17.0)
MCH: 30.3 pg (ref 26.0–34.0)
MCHC: 33.6 g/dL (ref 30.0–36.0)
MCV: 90.4 fL (ref 80.0–100.0)
Platelets: 244 10*3/uL (ref 150–400)
RBC: 4.88 MIL/uL (ref 4.22–5.81)
RDW: 13.7 % (ref 11.5–15.5)
WBC: 8.9 10*3/uL (ref 4.0–10.5)
nRBC: 0 % (ref 0.0–0.2)

## 2021-12-25 LAB — COPPER, SERUM: Copper: 118 ug/dL (ref 69–132)

## 2021-12-25 LAB — BASIC METABOLIC PANEL
Anion gap: 12 (ref 5–15)
BUN: 43 mg/dL — ABNORMAL HIGH (ref 8–23)
CO2: 16 mmol/L — ABNORMAL LOW (ref 22–32)
Calcium: 8.5 mg/dL — ABNORMAL LOW (ref 8.9–10.3)
Chloride: 112 mmol/L — ABNORMAL HIGH (ref 98–111)
Creatinine, Ser: 2.38 mg/dL — ABNORMAL HIGH (ref 0.61–1.24)
GFR, Estimated: 29 mL/min — ABNORMAL LOW (ref >60)
Glucose, Bld: 131 mg/dL — ABNORMAL HIGH (ref 70–99)
Potassium: 4.3 mmol/L (ref 3.5–5.1)
Sodium: 140 mmol/L (ref 135–145)

## 2021-12-25 LAB — TRIGLYCERIDES: Triglycerides: 213 mg/dL — ABNORMAL HIGH (ref <150)

## 2021-12-25 LAB — PROCALCITONIN: Procalcitonin: <0.1 ng/mL

## 2021-12-25 LAB — ZINC: Zinc: 44 ug/dL (ref 44–115)

## 2021-12-25 MED ORDER — LACTATED RINGERS IV SOLN: INTRAVENOUS | Status: DC

## 2021-12-25 MED ORDER — UMECLIDINIUM-VILANTEROL 62.5-25 MCG/ACT IN AEPB
1.0000 | INHALATION_SPRAY | Freq: Every day | RESPIRATORY_TRACT | Status: DC
  Administered 2021-12-28 – 2021-12-31 (×4): 1 via RESPIRATORY_TRACT
  Filled 2021-12-25 (×2): qty 14

## 2021-12-25 MED ORDER — PREDNISONE 20 MG PO TABS
40.0000 mg | ORAL_TABLET | Freq: Every day | ORAL | Status: DC
  Administered 2021-12-26: 40 mg via ORAL
  Filled 2021-12-25: qty 2

## 2021-12-25 MED ORDER — ARFORMOTEROL TARTRATE 15 MCG/2ML IN NEBU
15.0000 ug | INHALATION_SOLUTION | Freq: Two times a day (BID) | RESPIRATORY_TRACT | Status: AC
  Administered 2021-12-25: 15 ug via RESPIRATORY_TRACT
  Filled 2021-12-25: qty 2

## 2021-12-25 MED ORDER — HEPARIN SODIUM (PORCINE) 5000 UNIT/ML IJ SOLN
5000.0000 [IU] | Freq: Three times a day (TID) | INTRAMUSCULAR | Status: DC
  Administered 2021-12-25 – 2021-12-31 (×19): 5000 [IU] via SUBCUTANEOUS
  Filled 2021-12-25 (×20): qty 1

## 2021-12-25 MED ORDER — DEXTROSE 50 % IV SOLN
INTRAVENOUS | Status: AC
  Administered 2021-12-25: 50 mL
  Filled 2021-12-25: qty 50

## 2021-12-25 MED ORDER — THIAMINE HCL 100 MG PO TABS
100.0000 mg | ORAL_TABLET | Freq: Every day | ORAL | Status: DC
  Administered 2021-12-27 – 2021-12-31 (×5): 100 mg via ORAL
  Filled 2021-12-25 (×5): qty 1

## 2021-12-25 NOTE — Progress Notes (Addendum)
STROKE TEAM PROGRESS NOTE   INTERVAL HISTORY Patient is seen in his room with his wife at the bedside.. He has been hemodynamically stable and his respiratory status is stable on 2LNC. Bipap at night. Cr 2.38- LR at 64ml/hr MBS planned for today Cardene at 4mg  -> able to take po meds, now off. Plan to move out of ICU today.   Vitals:   12/25/21 0500 12/25/21 0600 12/25/21 0700 12/25/21 0800  BP: 133/70 (!) 165/83 (!) 164/99 (!) 144/75  Pulse: 91 91 87 77  Resp: 20 19 16 17   Temp:    97.9 F (36.6 C)  TempSrc:    Axillary  SpO2: 91% 90% 90% 91%  Weight:      Height:       CBC:  Recent Labs  Lab 12/21/21 1737 12/21/21 1745 12/24/21 0417 12/25/21 0555  WBC 6.8   < > 7.3 8.9  NEUTROABS 4.6  --   --   --   HGB 14.8   < > 14.4 14.8  HCT 43.9   < > 41.7 44.1  MCV 89.0   < > 87.8 90.4  PLT 286   < > 244 244   < > = values in this interval not displayed.    Basic Metabolic Panel:  Recent Labs  Lab 12/24/21 0417 12/25/21 0555  NA 138 140  K 3.9 4.3  CL 109 112*  CO2 20* 16*  GLUCOSE 133* 131*  BUN 31* 43*  CREATININE 2.28* 2.38*  CALCIUM 8.7* 8.5*    Lipid Panel:  Recent Labs  Lab 12/21/21 1737 12/23/21 0607 12/25/21 0555  CHOL 321*  --   --   TRIG 339*   < > 213*  HDL 61  --   --   CHOLHDL 5.3  --   --   VLDL 68*  --   --   LDLCALC 192*  --   --    < > = values in this interval not displayed.    HgbA1c:  Recent Labs  Lab 12/21/21 1737  HGBA1C 6.1*    Urine Drug Screen:  Recent Labs  Lab 12/22/21 0757  LABOPIA NONE DETECTED  COCAINSCRNUR NONE DETECTED  LABBENZ NONE DETECTED  AMPHETMU NONE DETECTED  THCU NONE DETECTED  LABBARB NONE DETECTED     Alcohol Level  Recent Labs  Lab 12/21/21 2032  ETH <10     IMAGING past 24 hours No results found.  PHYSICAL EXAM  Physical Exam  Constitutional: Appears well-developed and well-nourished.  Cardiovascular: Normal rate and regular rhythm.  Respiratory: Unlabored respirations on  supplemental O2 via [place]  Neuro: Mental Status: Patient is alert and oriented x2 Cranial Nerves: II: Visual Fields are full. Pupils are equal, round, and reactive to light.   III,IV, VI: EOMI without ptosis or diploplia.  V: Facial sensation symmetrical VII: Smile is symmetrical VIII: Hearing is intact to voice Motor: Tone is normal. Bulk is normal.  Left hemibody 5/5, follows commands Right UE: 1/5 Right LE: 2/5, follows commands Sensory: Sensation is symmetric to light touch in the arms and legs.  Cerebellar: FTN intact on left    ASSESSMENT/PLAN [name] is a 71 y.o. male with history of ETOH use (at least 1 pint per day), COPD, HTN, tobacco use (2-3 packs per day), and vitamin D deficiency presenting with right sided weakness, right facial droop. CIWA protocol initiated with ativan, last drink 5/14 evening. MRI pending. CTA shows severe stenosis. PT, OT pending. MRI pending. Dysphgia  3 diet ordered per SLP. PO medications initiated but held due to respiratory distress, will need repeat swallow study.  Patient placed on BiPAP yesterday, but respiratory status has improved today.  Plan to use BiPAP at night only. He was able to take PO BP this morning and cardene was turned off. Plan for MBS later today and move out of ICU.   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 Mild surrounding edema and local mass effect without significant midline shift. 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 Bilateral carotid bifurcation atherosclerosis without greater than 50% stenosis. Follow up CT 5/17 Unchanged size of left thalamic IPH MRI not able to perform due to respiratory  distress 2D Echo EF 65-70% LDL 192 HgbA1c 6.1 VTE prophylaxis - SCDs Reportedly taking 812mg  of ASA daily for pain prior to admission, now on No antithrombotic due to ICH Therapy recommendations:  Pending Disposition:  Pending  Respiratory distress/failure COPD Baseline wheezing at home due to COPD Worsening wheezing this admission - lasix 40 x 1, breathing treatment PRN nebs ordered CCM on board, appreciate assistance Currently off BiPAP, will use at night only Also manual Wheezing much improved 5/18  Hypertensive emergency Home meds:  Lisinopril 20mg , amlodipine 10mg , atenolol 50mg  IV cleviprex -> change to nicardipine due to elevated triglycerides Resume home amlodipine, atenolol  Added hydralazine 50->100 every 8h D/c lisinopril due to AKI Long-term BP goal normotensive BP goal less than 160  Hyperlipidemia Home meds:  Crestor 20mg  (not taking) LDL 192, goal < 70 Consider Crestor 40 at discharge  Diabetes type II Controlled Home meds:  Metformin HgbA1c 6.1, goal < 7.0 CBGs SSI Close PCP follow-up  Dysphagia SLP following Dysphagia 3 diet, thin liquid  BP meds resumed MBS today with speech therapy  Tobacco abuse Current smoker, smoking 2-3ppd Smoking cessation counseling provided Nicotine patch provided Pt is willing to quit  Alcohol abuse Heavy drinker, currently drinking 1 pint of tequila per day CIWA protocol initiated with ativan B1/FA/MVI  AKI Hyperkalemia Cr 1.20->2.22->2.39->2.28 -> 2.38 K+ 5.3->5.0->3.9 BMP monitoring IV hydration with LR  Other Stroke Risk Factors   Other Active Problems Acetylsalicylic Acid Overuse Reportedly taking 812mg  of ASA daily (2 325mg  and 2 81mg  pills)  Hospital day # 4  Patient seen and examined by NP/APP with MD. MD to update note as needed.   Ronnie Pickerevon Shafer, DNP, FNP-BC [facility] Pager: [phone]) [phone]  ATTENDING NOTE: I reviewed above note and agree with the assessment and plan. Pt  was seen and examined.   No family at bedside.  Patient lying in bed, still has right hemiplegia more on the arm.  Respiratory status stable, mild wheezing, much improved from 2 days ago.  BP stable, on BP meds, off Cardene drip.  Now on dysphagia 1 diet with honey thick liquid.  Continue IV hydration with LR.  Creatinine stable.  For detailed assessment and plan, please refer to above as I have made changes wherever appropriate.   Ronnie PlanJindong Tijana Walder, MD PhD Stroke Neurology 12/25/2021 7:30 PM  This patient is critically ill due to left BG ICH, IVH, respiratory distress needing BiPAP, AKI and at significant risk of neurological worsening, death form hematoma expansion, brain herniation, respiratory failure, renal failure. This patient's care  requires constant monitoring of vital signs, hemodynamics, respiratory and cardiac monitoring, review of multiple databases, neurological assessment, discussion with family, other specialists and medical decision making of high complexity. I spent 30 minutes of neurocritical care time in the care of this patient.    After hours, contact General Neurology

## 2021-12-25 NOTE — Progress Notes (Signed)
NAME:  Ronnie Jones, MRN:  [id number], DOB:  09-Aug-1951, LOS: 4 ADMISSION DATE:  12/21/2021, CONSULTATION DATE:  12/23/21 REFERRING MD:  Stroke Team CHIEF COMPLAINT:  Respiratory Distress   History of Present Illness:  Ronnie Jones is a 71 y.o. male who has a PMH including but not limited to COPD, hypertension, tobacco abuse, alcohol abuse.  He presented on 5/15 as a code stroke with right-sided weakness.  He was found his home sitting in the chair by his wife after she returned from the store.  In ED, CT head demonstrated left subcortical ICH.  He was started on Cleviprex for blood pressure control with goal systolic less than [id number].  He was admitted by neurology for further evaluation and management.  He was placed on CIWA for alcohol dependence.  On the morning of 5/17, he was noted to be less responsive than he previously was.  He was also noted to have respite distress with accessory muscle use or paradoxical breathing pattern.  PCCM subsequently called in consultation.  CXR, ABG obtained and pending. On exam, he is altered and minimally responsive. He is bronchospastic bilaterally right-sided greater than left.  He is being administered bronchodilators as well as IV Solu-Medrol.  He is empirically been placed on BiPAP while awaiting ABG for suspected hypercapnia after IV Ativan administration 1mg  at 0700.  Pertinent  Medical History:  has Encephalopathy acute; Respiratory distress; AKI (acute kidney injury) ([place]); and Intracerebral hemorrhage on their problem list.  Significant Hospital Events: Including procedures, antibiotic start and stop dates in addition to other pertinent events   5/15 admit. CT head, CA head/neck 5/15 > acute IPH in the left thalamus and medial basal ganglia with IV extension primarily into the left lateral ventricle.  Mild surrounding edema and local mass effect without significant midline shift. 5/16 Echo 5/16 > EF 65-70%, G1DD. BSSE: mechanical soft diet with thin  liquids by straw with aspiration precautions  recommended  5/17 respiratory distress, AMS, PCCM consulted, placed on NIPPV. Treated for AECOPD and RLL PNA w/ Unasyn, BDs started and scheduled daily solumedrol at 40mg /d Wheezing better by evening rounds. CT head 5/1 7 > unchanged size of left thalamic IPH.  No hydrocephalus or ventricular entrapment. 5/18 off NIPPV. Appropriate. Still w right sided hemiparesis. Still requiring cardene infusion. Bedside: Pt would benefit from downgraded diet of Dysphagia 1 (puree)/honey-thickened liquids provided by tsp with FULL supervision during all intake for implementation of swallowing strategies.  MBS recommended  5/19 no more wheezing. Room air sats 92% no sig change neuro exam   Interim History / Subjective:  "Long night"  Still on IV nicardipine  Afebrile   Objective:  Blood pressure (Abnormal) 165/83, pulse 91, temperature 97.8 F (36.6 C), temperature source Axillary, resp. rate 19, height 5\' 7"  (1.702 m), weight 77.1 kg, SpO2 90 %.        Intake/Output Summary (Last 24 hours) at 12/25/2021 0820 Last data filed at 12/25/2021 0715 Gross per 24 hour  Intake 1560.19 ml  Output 1600 ml  Net -39.81 ml   Filed Weights   12/21/21 1700 12/21/21 1833  Weight: 77.4 kg 77.1 kg    Examination: General 71 year old male. Resting in bed no distress HENT NCAT right sided facial droo[ Pulm clear no accessory use today  Card rrr  Abd soft Ext warm dry. Right side upper still about 1/5 left lower a little stronger    Assessment & Plan:  Principal Problem:   Intracerebral hemorrhage Active Problems:  Encephalopathy acute   DTs (delirium tremens) ([place])   Hypertensive emergency   Acute respiratory failure (HCC)   Aspiration pneumonia ([place])   COPD with acute exacerbation (HCC)   Dysphagia   Acute renal failure superimposed on stage 3a chronic kidney disease (HCC)   Normal anion gap metabolic acidosis   DM type 2 (diabetes mellitus, type 2)  (HCC)   Tobacco abuse   Solid nodule of lung 6 mm to 8 mm in diameter   Left IPH - presumed 2/2 uncontrolled HTN. Repeat CT head stable. Plan Serial neuro checks SBP goal <160 Cont statin  PT/OT/SLP following Once off gtt can looks towards rehab   HTN emergency - likely uncontrolled HTN at baseline. HLD. Plan Still on cardene, has norvasc, atenolol. Will see what his BP will do now that he can get these meds.  Not going to add back ace I given rising creatinine May need to add scheduled hydralazine    Acute respiratory failure 2/2 AECOPD and aspiration PNA H/o tobacco abuse  Hx COPD (Spirometry from [facility] in 2019 with ratio 47%),  better Plan Cont scheduled BDs Change solumedrol to pred Day 3 of 5 unasyn  Wean oxygen  IS   Mobilize  Dc bipap as he refused anyway  lung nodule (Chest CTA 2019 with 5 and 38mm RUL nodules, recommended f/u in July 2021). Plan F/u outpt    Acute metabolic encephalopathy 2/2 ETOH w/d w/ known Hx EtOH dependence -better Plan Phenobarb taper  Cont thiamine and folate   Acute on likely CKD - exacerbated by ACE, lasix, drop in BP. - renal fxn a little worse.  CKD Stage 3a - GFR 45 to 59 (Mildly to moderately decreased)  Plan Renal dose meds Gentle IVFs today  Am chem   Normal AG metabolic acidosis in setting of hyperchloremia  Plan Hold lasix  LR at 50  F/u chem  Hx DM. Plan Ssi    Best practice (evaluated daily):  Diet/type: dysphagia diet (see orders) DVT prophylaxis: SCD GI prophylaxis: N/A Lines: N/A Foley:  N/A Code Status:   full code Last date of multidisciplinary goals of care discussion: Discussed with wife and RN at bedside 5/17.   Critical care time: NA   Erick Colace ACNP-BC [facility] Pager # [phone] OR # [phone] if no answer

## 2021-12-25 NOTE — Progress Notes (Signed)
Physical Therapy Treatment Patient Details Name: Ronnie Jones MRN: [id number] DOB: Oct 12, 1950 Today's Date: 12/25/2021   History of Present Illness Ronnie Jones is a 71 year old gentleman who presented via EMS as a Code Stroke after he was found at home with right sided weakness, sitting in his chair, by his wife. CT showed L subcortical ICH. PMH:  ETOH use (at least 1 pint per day), COPD, HTN, tobacco use (2-3 packs per day), and vitamin D deficiency.    PT Comments    Pt tolerates treatment well, demonstrating improved activity tolerance and transfer quality. Pt demonstrates improved balance in standing with less significant R lateral lean. Pt continues to demonstrate profound R sided weakness, with no AROM of RUE noted and little with RLE. Pt will benefit from continued acute PT services in an effort to reduce falls risk and return toward independence. PT continues to recommend AIR.   Recommendations for follow up therapy are one component of a multi-disciplinary discharge planning process, led by the attending physician.  Recommendations may be updated based on patient status, additional functional criteria and insurance authorization.  Follow Up Recommendations  Acute inpatient rehab (3hours/day)     Assistance Recommended at Discharge Frequent or constant Supervision/Assistance  Patient can return home with the following Two people to help with walking and/or transfers;Two people to help with bathing/dressing/bathroom;Assistance with feeding;Assistance with cooking/housework;Direct supervision/assist for medications management;Direct supervision/assist for financial management;Assist for transportation;Help with stairs or ramp for entrance   Equipment Recommendations  Wheelchair (measurements PT);Wheelchair cushion (measurements PT);BSC/3in1;Other (comment) (hemiwalker)    Recommendations for Other Services       Precautions / Restrictions Precautions Precautions: Fall Precaution  Comments: watch O2 Restrictions Weight Bearing Restrictions: No     Mobility  Bed Mobility Overal bed mobility: Needs Assistance Bed Mobility: Supine to Sit     Supine to sit: Mod assist, HOB elevated Sit to supine: Mod assist, HOB elevated   General bed mobility comments: pt requires assist to flex R knee as foot is stuck against foot board. Pt utilizes hand hold through LUE to pull into sitting. Pt demonstrates the ability to hook LUE under RLE in effort to return to supine but continues to require assist for LE management    Transfers Overall transfer level: Needs assistance Equipment used: 1 person hand held assist Transfers: Sit to/from Stand Sit to Stand: Mod assist           General transfer comment: pt with improved balance without significant rightward lean in standing. stands for 2 attempts with R knee block    Ambulation/Gait Ambulation/Gait assistance: Max assist Gait Distance (Feet): 2 Feet Assistive device: 1 person hand held assist Gait Pattern/deviations: Shuffle   Gait velocity interpretation: <1.31 ft/sec, indicative of household ambulator   General Gait Details: pt is able to side step with LLE with PT providing knee block. With PT cues to attempt to slide R foot to midline the pt continues to attempt to slide left foot. PT assists with movement of RLE to slide laterally at edge of bed   Stairs             Wheelchair Mobility    Modified Rankin (Stroke Patients Only) Modified Rankin (Stroke Patients Only) Pre-Morbid Rankin Score: No symptoms Modified Rankin: Severe disability     Balance Overall balance assessment: Needs assistance Sitting-balance support: Single extremity supported, Feet supported Sitting balance-Leahy Scale: Poor Sitting balance - Comments: modA initially, improves to minA, less significant R lateral lean Postural control:  Right lateral lean Standing balance support: Single extremity supported Standing  balance-Leahy Scale: Poor Standing balance comment: modA for static, maxA with dynamic                            Cognition Arousal/Alertness: Awake/alert Behavior During Therapy: WFL for tasks assessed/performed (generally flat but does tell a joke or 2) Overall Cognitive Status: Impaired/Different from baseline Area of Impairment: Attention, Awareness                   Current Attention Level: Sustained       Awareness: Intellectual   General Comments: R inattention        Exercises General Exercises - Upper Extremity Shoulder Flexion: PROM, Right, 10 reps Shoulder Extension: PROM, Right, 10 reps Elbow Flexion: PROM, Right, 15 reps Elbow Extension: PROM, Right, 15 reps Wrist Flexion: PROM, Right, 10 reps Wrist Extension: PROM, Right, 10 reps Digit Composite Flexion: PROM, Right, 10 reps Composite Extension: PROM, Right, 10 reps    General Comments General comments (skin integrity, edema, etc.): pt on 2L Ronnie Jones, VSS in low 90s.      Pertinent Vitals/Pain Pain Assessment Pain Assessment: No/denies pain    Home Living                          Prior Function            PT Goals (current goals can now be found in the care plan section) Acute Rehab PT Goals Patient Stated Goal: return home Progress towards PT goals: Progressing toward goals    Frequency    Min 4X/week      PT Plan Current plan remains appropriate    Co-evaluation              AM-PAC PT "6 Clicks" Mobility   Outcome Measure  Help needed turning from your back to your side while in a flat bed without using bedrails?: A Lot Help needed moving from lying on your back to sitting on the side of a flat bed without using bedrails?: A Lot Help needed moving to and from a bed to a chair (including a wheelchair)?: A Lot Help needed standing up from a chair using your arms (e.g., wheelchair or bedside chair)?: A Lot Help needed to walk in hospital room?: Total Help  needed climbing 3-5 steps with a railing? : Total 6 Click Score: 10    End of Session Equipment Utilized During Treatment: Oxygen Activity Tolerance: Patient tolerated treatment well Patient left: in bed;with call bell/phone within reach;with bed alarm set;with family/visitor present Nurse Communication: Mobility status PT Visit Diagnosis: Unsteadiness on feet (R26.81);Hemiplegia and hemiparesis;Difficulty in walking, not elsewhere classified (R26.2) Hemiplegia - Right/Left: Right Hemiplegia - dominant/non-dominant: Dominant Hemiplegia - caused by: Nontraumatic SAH     Time: 1130-1155 PT Time Calculation (min) (ACUTE ONLY): 25 min  Charges:  $Therapeutic Exercise: 8-22 mins $Therapeutic Activity: 8-22 mins                     Arlyss Gandy, PT, DPT Acute Rehabilitation Pager: [phone] Office [phone]    Arlyss Gandy 12/25/2021, 12:19 PM

## 2021-12-25 NOTE — Progress Notes (Signed)
Inpatient Rehab Admissions Coordinator:  Pt is not medically ready for CIR admission. Pt is on Cardene. Will continue to monitor medial workup and progress with therapies. Will begin insurance authorization when appropriate.   Wolfgang Phoenix, MS, CCC-SLP Admissions Coordinator [phone]

## 2021-12-25 NOTE — Progress Notes (Signed)
Modified Barium Swallow Progress Note  Patient Details  Name: Dietrick Barris MRN: [id number] Date of Birth: 12-09-1950  Today's Date: 12/25/2021  Modified Barium Swallow completed.  Full report located under Chart Review in the Imaging Section.  Brief recommendations include the following:  Clinical Impression  Pt presents with oral and pharyngeal dysphagia, but with visibility of airway limited throughout the study due to pt positioning. He has delayed oral propulsion and poor cohesion especially with thinner consistencies, that spill to the pyriform sinuses. There are times with thin and nectar thick liquids that this impacts his timing for coordination of airway protection. Penetration is definitely observed, but because the vocal folds are not clearly visible around shadow of pt's shoulder despite repositioning efforts, it is not clear if pt aspirates or if he even clears penetrates from his laryngeal vestibule. There was barium identified under the vocal folds in between boluses once as he shifted positioning, indicating that silent aspiration had likely occurred at some point. Pt has slower oral clearance and more oral residue with honey thick liquids and purees, but he can also better protect his airway. Recommend continuing with Dys 1 diet and honey thick liquids.   Swallow Evaluation Recommendations       SLP Diet Recommendations: Dysphagia 1 (Puree) solids;Honey thick liquids   Liquid Administration via: Cup   Medication Administration: Crushed with puree   Supervision: Staff to assist with self feeding   Compensations: Slow rate;Small sips/bites;Minimize environmental distractions;Monitor for anterior loss;Lingual sweep for clearance of pocketing   Postural Changes: Seated upright at 90 degrees;Remain semi-upright after after feeds/meals (Comment)   Oral Care Recommendations: Oral care BID        Mahala Menghini., M.A. CCC-SLP Acute Rehabilitation Services Office  [phone]  Secure chat preferred  12/25/2021,3:16 PM

## 2021-12-26 ENCOUNTER — Inpatient Hospital Stay (HOSPITAL_COMMUNITY): Payer: Medicare HMO

## 2021-12-26 DIAGNOSIS — N179 Acute kidney failure, unspecified: Secondary | ICD-10-CM | POA: Diagnosis not present

## 2021-12-26 DIAGNOSIS — E559 Vitamin D deficiency, unspecified: Secondary | ICD-10-CM

## 2021-12-26 DIAGNOSIS — J441 Chronic obstructive pulmonary disease with (acute) exacerbation: Secondary | ICD-10-CM | POA: Diagnosis not present

## 2021-12-26 DIAGNOSIS — J9601 Acute respiratory failure with hypoxia: Secondary | ICD-10-CM | POA: Diagnosis not present

## 2021-12-26 DIAGNOSIS — F1090 Alcohol use, unspecified, uncomplicated: Secondary | ICD-10-CM

## 2021-12-26 DIAGNOSIS — I619 Nontraumatic intracerebral hemorrhage, unspecified: Secondary | ICD-10-CM | POA: Diagnosis not present

## 2021-12-26 LAB — BASIC METABOLIC PANEL
Anion gap: 11 (ref 5–15)
BUN: 39 mg/dL — ABNORMAL HIGH (ref 8–23)
CO2: 19 mmol/L — ABNORMAL LOW (ref 22–32)
Calcium: 8.9 mg/dL (ref 8.9–10.3)
Chloride: 113 mmol/L — ABNORMAL HIGH (ref 98–111)
Creatinine, Ser: 2 mg/dL — ABNORMAL HIGH (ref 0.61–1.24)
GFR, Estimated: 36 mL/min — ABNORMAL LOW (ref >60)
Glucose, Bld: 117 mg/dL — ABNORMAL HIGH (ref 70–99)
Potassium: 4.1 mmol/L (ref 3.5–5.1)
Sodium: 143 mmol/L (ref 135–145)

## 2021-12-26 LAB — CBC
HCT: 44.1 % (ref 39.0–52.0)
Hemoglobin: 14.5 g/dL (ref 13.0–17.0)
MCH: 29.8 pg (ref 26.0–34.0)
MCHC: 32.9 g/dL (ref 30.0–36.0)
MCV: 90.6 fL (ref 80.0–100.0)
Platelets: 278 10*3/uL (ref 150–400)
RBC: 4.87 MIL/uL (ref 4.22–5.81)
RDW: 13.7 % (ref 11.5–15.5)
WBC: 9.3 10*3/uL (ref 4.0–10.5)
nRBC: 0.2 % (ref 0.0–0.2)

## 2021-12-26 LAB — GLUCOSE, CAPILLARY
Glucose-Capillary: 101 mg/dL — ABNORMAL HIGH (ref 70–99)
Glucose-Capillary: 122 mg/dL — ABNORMAL HIGH (ref 70–99)
Glucose-Capillary: 132 mg/dL — ABNORMAL HIGH (ref 70–99)
Glucose-Capillary: 133 mg/dL — ABNORMAL HIGH (ref 70–99)

## 2021-12-26 IMAGING — DX DG CHEST 1V PORT
1 series · 1 of 1 positions shown · non-contrast
Comparison: Chest radiograph dated [DATE].

CLINICAL DATA: Chest pain.

EXAM:
PORTABLE CHEST 1 VIEW

[chest ap]
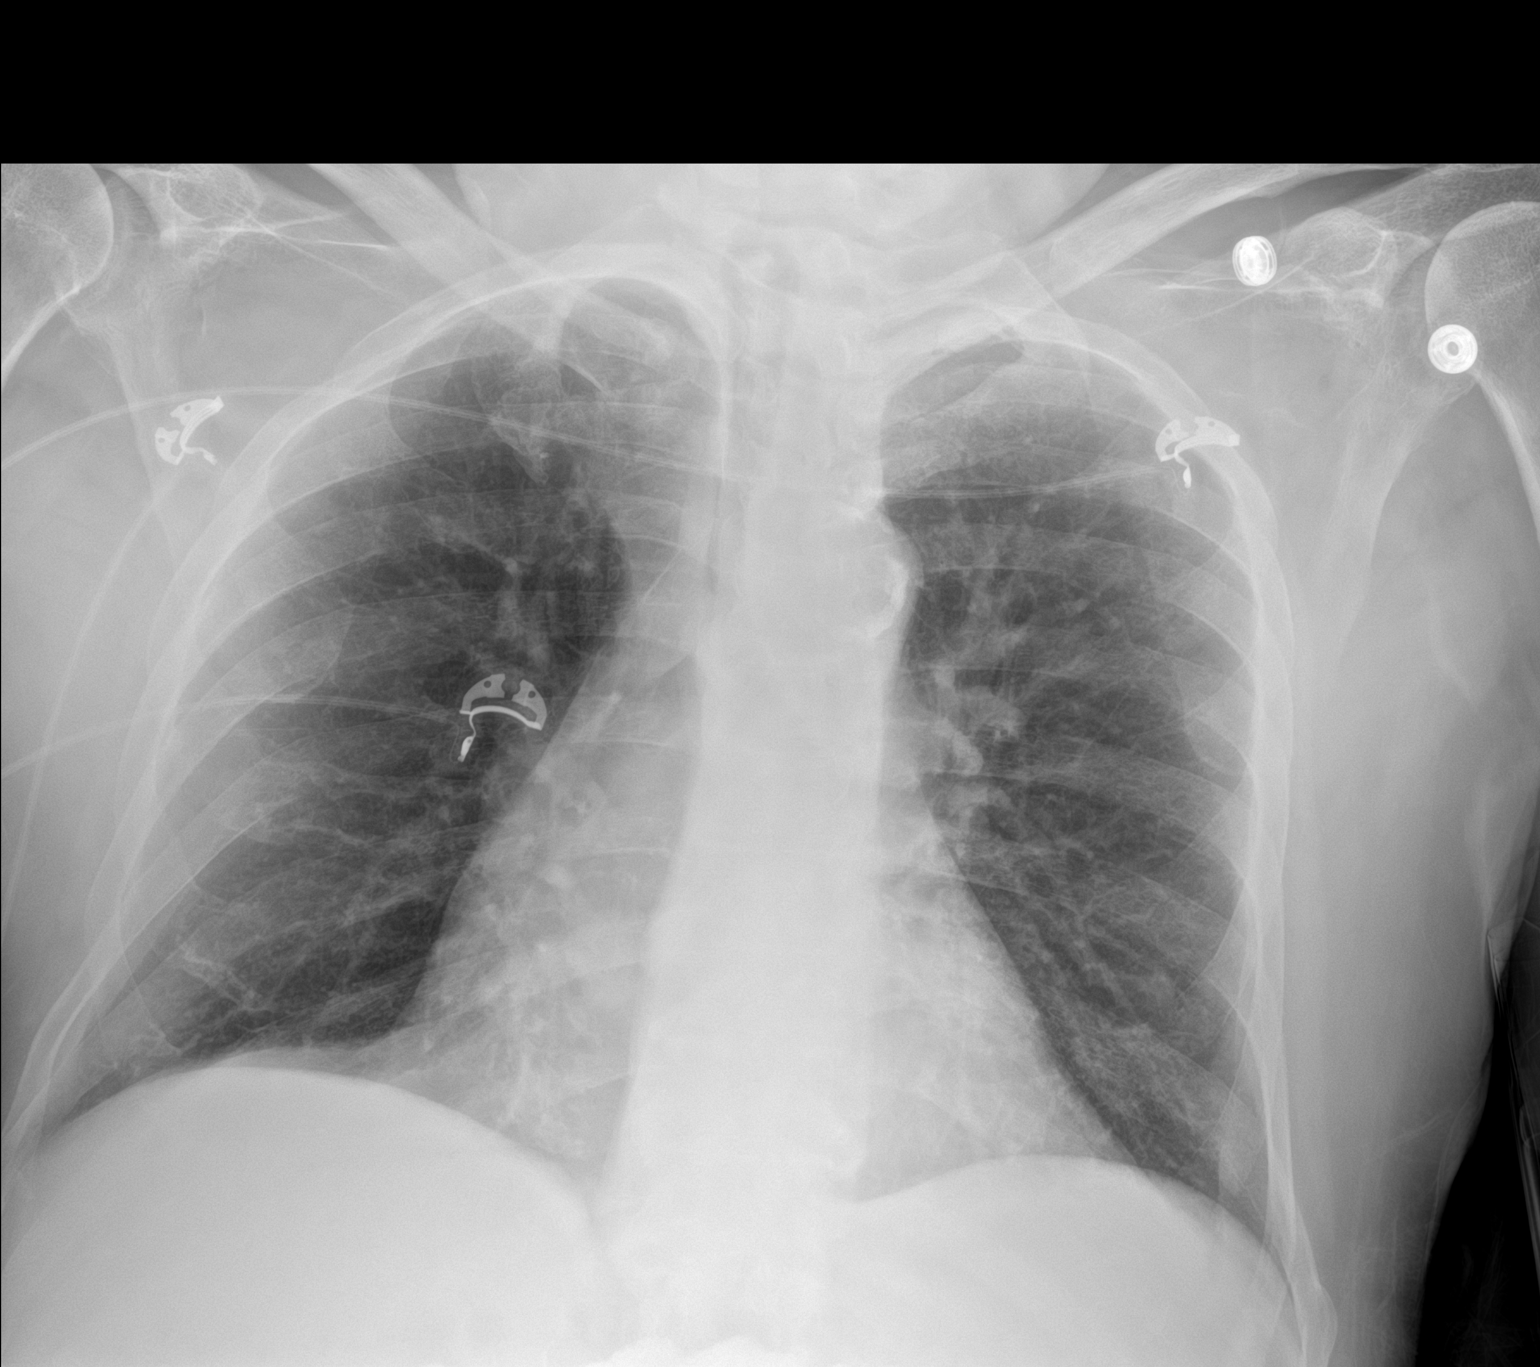

[1 of 1 positions shown; findings below may reference images not displayed]

FINDINGS: The heart size is normal. Vascular calcifications are seen in the
aortic arch. Both lungs are clear. The visualized skeletal
structures are unremarkable.
IMPRESSION: No active disease.

Aortic Atherosclerosis ([3T]-[3T]).

## 2021-12-26 MED ORDER — IPRATROPIUM-ALBUTEROL 0.5-2.5 (3) MG/3ML IN SOLN
3.0000 mL | RESPIRATORY_TRACT | Status: DC | PRN
  Administered 2021-12-26 – 2021-12-31 (×17): 3 mL via RESPIRATORY_TRACT
  Filled 2021-12-26 (×18): qty 3

## 2021-12-26 MED ORDER — SODIUM CHLORIDE 0.9 % IV BOLUS
500.0000 mL | Freq: Once | INTRAVENOUS | Status: AC
  Administered 2021-12-26: 500 mL via INTRAVENOUS

## 2021-12-26 MED ORDER — METHYLPREDNISOLONE SODIUM SUCC 40 MG IJ SOLR
40.0000 mg | Freq: Two times a day (BID) | INTRAMUSCULAR | Status: DC
  Administered 2021-12-26 – 2021-12-28 (×5): 40 mg via INTRAVENOUS
  Filled 2021-12-26 (×6): qty 1

## 2021-12-26 NOTE — Assessment & Plan Note (Signed)
Doing well 

## 2021-12-26 NOTE — Progress Notes (Addendum)
STROKE TEAM PROGRESS NOTE   INTERVAL HISTORY Patient is seen in his room with his wife at the bedside.. He has been hemodynamically stable and his respiratory status is stable on 2LNC. Cr at 2.0 this morning, trending down.  Has needed breathing treatments for wheezing.   Vitals:   12/26/21 0323 12/26/21 0759 12/26/21 1125 12/26/21 1342  BP: (!) 146/76 133/65  121/69  Pulse: 79 81  72  Resp: 19 20  16   Temp: 98.1 F (36.7 C) 97.7 F (36.5 C)  98.6 F (37 C)  TempSrc:  Oral  Oral  SpO2: 94% 96% 94% 97%  Weight:      Height:       CBC:  Recent Labs  Lab 12/21/21 1737 12/21/21 1745 12/25/21 0555 12/26/21 0232  WBC 6.8   < > 8.9 9.3  NEUTROABS 4.6  --   --   --   HGB 14.8   < > 14.8 14.5  HCT 43.9   < > 44.1 44.1  MCV 89.0   < > 90.4 90.6  PLT 286   < > 244 278   < > = values in this interval not displayed.    Basic Metabolic Panel:  Recent Labs  Lab 12/25/21 0555 12/26/21 0232  NA 140 143  K 4.3 4.1  CL 112* 113*  CO2 16* 19*  GLUCOSE 131* 117*  BUN 43* 39*  CREATININE 2.38* 2.00*  CALCIUM 8.5* 8.9    Lipid Panel:  Recent Labs  Lab 12/21/21 1737 12/23/21 0607 12/25/21 0555  CHOL 321*  --   --   TRIG 339*   < > 213*  HDL 61  --   --   CHOLHDL 5.3  --   --   VLDL 68*  --   --   LDLCALC 192*  --   --    < > = values in this interval not displayed.    HgbA1c:  Recent Labs  Lab 12/21/21 1737  HGBA1C 6.1*    Urine Drug Screen:  Recent Labs  Lab 12/22/21 0757  LABOPIA NONE DETECTED  COCAINSCRNUR NONE DETECTED  LABBENZ NONE DETECTED  AMPHETMU NONE DETECTED  THCU NONE DETECTED  LABBARB NONE DETECTED     Alcohol Level  Recent Labs  Lab 12/21/21 2032  ETH <10     IMAGING past 24 hours DG CHEST PORT 1 VIEW  Result Date: 12/26/2021 CLINICAL DATA:  Chest pain. EXAM: PORTABLE CHEST 1 VIEW COMPARISON:  Chest radiograph dated 12/23/2021. FINDINGS: The heart size is normal. Vascular calcifications are seen in the aortic arch. Both lungs are  clear. The visualized skeletal structures are unremarkable. IMPRESSION: No active disease. Aortic Atherosclerosis (ICD10-I70.0). Electronically Signed   By: Zerita Boers M.D.   On: 12/26/2021 15:05    PHYSICAL EXAM  Physical Exam  Constitutional: Appears well-developed and well-nourished.  Cardiovascular: Normal rate and regular rhythm.  Respiratory: Unlabored respirations on supplemental O2 via [place]  Neuro: Mental Status: Patient is alert and oriented x2 Cranial Nerves: II: Visual Fields are full. Pupils are equal, round, and reactive to light.   III,IV, VI: EOMI without ptosis or diploplia.  V: Facial sensation symmetrical VII: Smile is symmetrical VIII: Hearing is intact to voice Motor: Tone is normal. Bulk is normal.  Left hemibody 5/5, follows commands Right UE: 1/5 Right LE: 2/5, follows commands Sensory: Sensation is symmetric to light touch in the arms and legs.  Cerebellar: FTN intact on left    ASSESSMENT/PLAN Ronnie Jones  is a 71 y.o. male with history of ETOH use (at least 1 pint per day), COPD, HTN, tobacco use (2-3 packs per day), and vitamin D deficiency presenting with right sided weakness, right facial droop. CIWA protocol initiated with ativan, last drink 5/14 evening. MRI pending. CTA shows severe stenosis. PT, OT pending. MRI pending. Dysphgia 3 diet ordered per SLP.  Transferred out of the ICU to the floor bed yesterday.  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 Mild surrounding edema and local mass effect without significant midline shift. 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 Bilateral carotid bifurcation atherosclerosis without  greater than 50% stenosis. Follow up CT 5/17 Unchanged size of left thalamic IPH MRI not able to perform due to respiratory distress 2D Echo EF 65-70% LDL 192 HgbA1c 6.1 VTE prophylaxis - SCDs Reportedly taking 812mg  of ASA daily for pain prior to admission, now on No antithrombotic due to [place] Therapy recommendations: CIR Disposition:  Pending  Respiratory distress/failure COPD Baseline wheezing at home due to COPD Worsening wheezing this admission - lasix 40 x 1, breathing treatment PRN nebs ordered CCM on board, appreciate assistance Currently off BiPAP, will use at night only Also manual Wheezing much improved 5/18  Hypertensive emergency Home meds:  Lisinopril 20mg , amlodipine 10mg , atenolol 50mg  IV cleviprex -> change to nicardipine due to elevated triglycerides Resume home amlodipine, atenolol  Added hydralazine 50->100 every 8h D/c lisinopril due to AKI Long-term BP goal normotensive BP goal less than 160  Hyperlipidemia Home meds:  Crestor 20mg  (not taking) LDL 192, goal < 70 Consider Crestor 40 at discharge  Diabetes type II Controlled Home meds:  Metformin HgbA1c 6.1, goal < 7.0 CBGs SSI Close PCP follow-up  Dysphagia SLP following Dysphagia 3 diet, thin liquid  BP meds resumed MBS today with speech therapy  Tobacco abuse Current smoker, smoking 2-3ppd Smoking cessation counseling provided Nicotine patch provided Pt is willing to quit  Alcohol abuse Heavy drinker, currently drinking 1 pint of tequila per day CIWA protocol initiated with ativan B1/FA/MVI  AKI Hyperkalemia Cr 1.20->2.22->2.39->2.28 -> 2.38 K+ 5.3->5.0->3.9 BMP monitoring IV hydration with LR  Other Stroke Risk Factors   Other Active Problems Acetylsalicylic Acid Overuse Reportedly taking 812mg  of ASA daily (2 325mg  and 2 81mg  pills)  Hospital day # 5  Patient seen and examined by NP/APP with MD. MD to update note as needed.   Ronnie Ores, DNP, FNP-BC [facility] Pager: [phone]  ATTENDING ATTESTATION:  71 year old gentleman with history of alcohol use tobacco use COPD hypertension with left subcortical intraparenchymal hemorrhage transferred out of the ICU yesterday.  He is doing well his exam is stable.  PT recommending inpatient rehab.  No antiplatelets at this time due to intracranial hemorrhage.  Dr. Reeves Forth evaluated pt independently, reviewed imaging, chart, labs. Discussed and formulated plan with the APP. Please see APP note above for details.   Total 36 minutes spent on counseling patient and coordinating care, writing notes and reviewing chart.  Sharika Mosquera,MD  After hours, contact General Neurology

## 2021-12-26 NOTE — Assessment & Plan Note (Addendum)
This has resolved, treated with phenobarbital taper.

## 2021-12-26 NOTE — Progress Notes (Signed)
Hypoglycemic Event  CBG: 56   Treatment: D50 50 mL (25 gm)  Symptoms: Hungry and Nervous/irritable  Follow-up CBG: Time: 2159 CBG Result:125  Possible Reasons for Event: Inadequate meal intake  Comments/MD notified:none at this time. Protocol followed.    Arthor Captain

## 2021-12-26 NOTE — Assessment & Plan Note (Addendum)
CKD ruled out.  No clear prior chronic kidney disease.  Baseline 2019 0.6-0.7, here was 1.3 on admission, up to 2-2.3 over the last several days in setting of BP lowering  Urine with proteinuria, acellular.  His blood pressure was lowered and he had not some IV fluids his creatinine is down to 1.6 and stabilized there. - Follow-up creatinine as an outpatient

## 2021-12-26 NOTE — Assessment & Plan Note (Addendum)
Smoking cessation recommended 

## 2021-12-26 NOTE — Progress Notes (Signed)
Progress Note   Patient: Ronnie Jones NTI:[id number] DOB: 24-Jul-1951 DOA: 12/21/2021     5 DOS: the patient was seen and examined on 12/26/2021 at 11:05AM      Brief hospital course: Mr. Saxton is a 71 y.o. M with DM, HTN, COPD FEV1 46% by PFT, 23% by spirometry in 2019, alcohol dependence, still smoking, lung nodules who presented with RIGHT hemiparesis.  Wife found him at home in a chair, right sided weakness, right facial droop.    5/15: Admitted, CT showed L thalamic ICH with intraventricular extension, reported taking 10 low-dose aspirin per day 5/17: developed respiratory distress, PCCM consulted, placed on BiPAP, started on steroids and antibiotics, repeat CTH no change 5/18: Off BiPAP 5/19: Nicardipine d/c'd, Cr slightly better with IV fluids     Assessment and Plan: * Intraparenchymal hemorrhage of brain (HCC) Echo without cardiogenic source.  Carotid imaging normal.  - Continue atenolol, hydralazine - LDL 192, defer statin to Neurology    Hypertensive emergency Blood pressures up to 190s systolic and 130s diastolic on admission.  Intraparenchymal hemorrhage. - Continue atenolol, amlodipine, hydralazine  COPD with acute exacerbation (HCC) - Increase to Solu-medrol 40 BID - Continue Anoro - Increase ipratropium - Continue PPI  Acute respiratory failure with hypoxia (HCC) At baseline does not use O2.  On 5/16 and 5/17 was persistently hypoxic to the 80s and had respiratory rate up to the 30s, placed on BiPAP.  Aspiration pneumonia (HCC) Procal neg and CXR clear.  Pulm have started antibitoics - Continue Unasyn  AKI (acute kidney injury) (HCC) CKD ruled out.  No clear prior chronic kidney disease.  Baseline 2019 0.6-0.7, here was 1.3 on admission, up to 2-2.3 over the last several days in setting of BP lowering  Urine with proteinuria, acellular. - Prot/creat ratio - US Renal   Alcohol use disorder Developed complicated withdrawals, improving on  phenobarbital  Vitamin D deficiency B12, zinc, copper normal.  A, C and B6 pending.  Solid nodule of lung 6 mm to 8 mm in diameter Previously known.   - Outpatient follow up  Tobacco abuse    DM type 2 (diabetes mellitus, type 2) (HCC) GLucose normal - Continue SS corrections   Dysphagia Doing well  Normal anion gap metabolic acidosis    DTs (delirium tremens) (HCC) Alert and oriented today - Continue phenobarbital taper per protocol  Acute metabolic encephalopathy At baseline he is independent, has no cognitive impairment.  On presentation he was confused due to IPH.            Subjective: Patient very wheezy and tired.  Out of breath.  Not coughing up mucus.  No orthopnea or swelling.  No fever.  Still paralyzed on the right, no new neurological symptoms     Physical Exam: Vitals:   12/26/21 0323 12/26/21 0759 12/26/21 1125 12/26/21 1342  BP: (!) 146/76 133/65  121/69  Pulse: 79 81  72  Resp: 19 20  16   Temp: 98.1 F (36.7 C) 97.7 F (36.5 C)  98.6 F (37 C)  TempSrc:  Oral  Oral  SpO2: 94% 96% 94% 97%  Weight:      Height:       Male, lying in bed, appears tired and out of breath Tachycardic, regular, no murmurs, no peripheral edema, no elevation in JVP Respiratory effort increased, wheezing bilaterally audibly from outside, coarse wheezes on auscultation Abdomen soft without tenderness palpation or guarding Attention normal, affect blunted by dyspnea, paralyzed on the right, right facial  droop.   Data Reviewed: Nursing notes reviewed, vital signs reviewed Basic metabolic panel reviewed Echocardiogram reviewed LDL reviewed Procalcitonin reviewed Triglycerides reviewed  Family Communication: Wife and son at the bedside    Disposition: Status is: Inpatient         Author: Alberteen Sam, MD 12/26/2021 2:41 PM  For on call review [email or web address].

## 2021-12-26 NOTE — Assessment & Plan Note (Addendum)
Developed complicated withdrawals, now completely resolved.

## 2021-12-26 NOTE — Assessment & Plan Note (Signed)
Previously known.   - Outpatient follow up

## 2021-12-26 NOTE — Assessment & Plan Note (Addendum)
This appears to be improving - Transition to prednisone, plan for 9 to 10-day taper - Continue ICS/LABA - Continue DuoNeb - Continue PPI

## 2021-12-26 NOTE — Assessment & Plan Note (Addendum)
MRI brain showed thalamic hemorrhage.  Repeat MRI on 5/21 showed normal evolution, no worsening.   Echo without cardiogenic source.  Noninvasive angiography showed bilateral vertebral disease.  Carotid imaging normal.  - Aspirin held given intraparenchymal hemorrhage - Lipids ordered, LDL 192, start Crestor 40 mg nightly - Continue blood pressure control, amlodipine, atenolol, hydralazine    -Atrial fibrillation: Not present on imaging -Dysphagia screen ordered in ER -PT eval ordered: recommended patient rehab -Smoking cessation: Counseling given

## 2021-12-26 NOTE — Assessment & Plan Note (Addendum)
Glucose up today - Continue SS corrections, increase dose today

## 2021-12-26 NOTE — Progress Notes (Signed)
Physical Therapy Treatment Patient Details Name: Ronnie Jones MRN: [id number] DOB: April 25, 1951 Today's Date: 12/26/2021   History of Present Illness Ronnie Jones is a 71 year old gentleman who presented via EMS as a Code Stroke after he was found at home with right sided weakness, sitting in his chair, by his wife. CT showed L subcortical ICH. PMH:  ETOH use (at least 1 pint per day), COPD, HTN, tobacco use (2-3 packs per day), and vitamin D deficiency.    PT Comments    Pt received in supine, agreeable to therapy session with emphasis on seated/standing balance, fall risk prevention, benefits of mobility and activity pacing. Pt receptive to instruction but will need reinforcement due to slower processing and short term memory deficit. Pt seen with cup with ice, RN notified he is on honey thick liquid per SLP rec, cup moved away from him for safety due to increased aspiration risk. Some wheezing with exertion but SpO2 WFL on RA. Pt continues to benefit from PT services to progress toward functional mobility goals.    Recommendations for follow up therapy are one component of a multi-disciplinary discharge planning process, led by the attending physician.  Recommendations may be updated based on patient status, additional functional criteria and insurance authorization.  Follow Up Recommendations  Acute inpatient rehab (3hours/day)     Assistance Recommended at Discharge Frequent or constant Supervision/Assistance  Patient can return home with the following Two people to help with walking and/or transfers;Two people to help with bathing/dressing/bathroom;Assistance with feeding;Assistance with cooking/housework;Direct supervision/assist for medications management;Direct supervision/assist for financial management;Assist for transportation;Help with stairs or ramp for entrance   Equipment Recommendations  Wheelchair (measurements PT);Wheelchair cushion (measurements PT);BSC/3in1;Other (comment)  (hemiwalker)    Recommendations for Other Services       Precautions / Restrictions Precautions Precautions: Fall Precaution Comments: watch O2 Restrictions Weight Bearing Restrictions: No     Mobility  Bed Mobility Overal bed mobility: Needs Assistance Bed Mobility: Rolling, Sidelying to Sit, Sit to Sidelying Rolling: Mod assist Sidelying to sit: Mod assist, HOB elevated     Sit to sidelying: Mod assist General bed mobility comments: with cues, pt demonstrates the ability to hook LUE under RLE in effort to return to supine but continues to require assist for LE management    Transfers Overall transfer level: Needs assistance Equipment used: 1 person hand held assist Transfers: Sit to/from Stand Sit to Stand: Mod assist           General transfer comment: Pt holding on to back of chair for stability upon standing with LUE; stands for 2 attempts with R knee block, R knee buckling    Ambulation/Gait               General Gait Details: x1 sidestep toward [facility] but unstable, unsafe to progress further with only +1 assist   Modified Rankin (Stroke Patients Only) Modified Rankin (Stroke Patients Only) Pre-Morbid Rankin Score: No symptoms Modified Rankin: Severe disability     Balance Overall balance assessment: Needs assistance Sitting-balance support: Single extremity supported, Feet supported Sitting balance-Leahy Scale: Poor Sitting balance - Comments: minA, less significant R lateral lean, cues needed for upright posture Postural control: Right lateral lean Standing balance support: Single extremity supported Standing balance-Leahy Scale: Poor Standing balance comment: modA for static, maxA with dynamic                            Cognition Arousal/Alertness: Awake/alert Behavior During  Therapy: WFL for tasks assessed/performed (generally flat but does tell a joke or 2) Overall Cognitive Status: Impaired/Different from baseline Area of  Impairment: Attention, Awareness                   Current Attention Level: Sustained       Awareness: Intellectual   General Comments: R inattention, flat affect; 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        Exercises General Exercises - Upper Extremity Shoulder Flexion: PROM, Right, 10 reps Shoulder Extension: PROM, Right, 10 reps Elbow Flexion: PROM, Right, 15 reps Elbow Extension: PROM, Right, 15 reps Wrist Flexion: PROM, Right, 10 reps Wrist Extension: PROM, Right, 10 reps Digit Composite Flexion: PROM, Right, 10 reps Composite Extension: PROM, Right, 10 reps General Exercises - Lower Extremity Short Arc Quad: AROM, Left, 10 reps Hip Flexion/Marching: AROM, PROM, Both, 10 reps, Supine    General Comments General comments (skin integrity, edema, etc.): BP 143/80 seated; SpO2 92-94% on RA pt wheezing RN alerted      Pertinent Vitals/Pain Pain Assessment Pain Assessment: No/denies pain    Home Living Family/patient expects to be discharged to:: Private residence Living Arrangements: Spouse/significant other                      Prior Function            PT Goals (current goals can now be found in the care plan section) Acute Rehab PT Goals Patient Stated Goal: return home PT Goal Formulation: With patient Time For Goal Achievement: 01/05/22 Progress towards PT goals: Progressing toward goals    Frequency    Min 4X/week      PT Plan Current plan remains appropriate    Co-evaluation              AM-PAC PT "6 Clicks" Mobility   Outcome Measure  Help needed turning from your back to your side while in a flat bed without using bedrails?: A Lot Help needed moving from lying on your back to sitting on the side of a flat bed without using bedrails?: A Lot Help needed moving to and from  a bed to a chair (including a wheelchair)?: A Lot Help needed standing up from a chair using your arms (e.g., wheelchair or bedside chair)?: A Lot Help needed to walk in hospital room?: Total Help needed climbing 3-5 steps with a railing? : Total 6 Click Score: 10    End of Session Equipment Utilized During Treatment: Gait belt Activity Tolerance: Patient tolerated treatment well Patient left: in bed;with call bell/phone within reach;with bed alarm set (bed in chair posture) Nurse Communication: Mobility status PT Visit Diagnosis: Unsteadiness on feet (R26.81);Hemiplegia and hemiparesis;Difficulty in walking, not elsewhere classified (R26.2) Hemiplegia - Right/Left: Right Hemiplegia - dominant/non-dominant: Dominant Hemiplegia - caused by: Nontraumatic SAH     Time: 6270-3500 PT Time Calculation (min) (ACUTE ONLY): 31 min  Charges:  $Therapeutic Exercise: 8-22 mins $Therapeutic Activity: 8-22 mins                     Mays Paino P., PTA Acute Rehabilitation Services Secure Chat Preferred 9a-5:30pm Office: [phone]    Dorathy Kinsman [facility] [place] 12/26/2021, 4:54 PM

## 2021-12-26 NOTE — Assessment & Plan Note (Addendum)
Blood pressures up to 190s systolic and 130s diastolic on admission with accompanying intraparenchymal hemorrhage.  Blood pressures now been controlled - Continue atenolol, amlodipine, hydralazine

## 2021-12-26 NOTE — Assessment & Plan Note (Addendum)
At baseline does not use O2.  On 5/16 and 5/17 was persistently hypoxic to the 80s and had respiratory rate up to the 30s, placed on BiPAP.  Now resolved to room air>  COPD flare improving.

## 2021-12-26 NOTE — Assessment & Plan Note (Addendum)
B12, zinc, vitamin A, and copper normal.   - Continue ergocalciferol weekly - Check level in 6 months

## 2021-12-26 NOTE — Assessment & Plan Note (Addendum)
Likely from AKI, improved today, PH normal.

## 2021-12-26 NOTE — Assessment & Plan Note (Addendum)
Procal neg and CXR clear.  Pulm started antibiotics, he completed 5 days.

## 2021-12-26 NOTE — Hospital Course (Addendum)
Ronnie Jones is a 71 y.o. M with DM, HTN, COPD FEV1 46% by PFT, 23% by spirometry in 2019, alcohol dependence, still smoking, lung nodules who presented with RIGHT hemiparesis.  Wife found him at home in a chair, right sided weakness, right facial droop.    5/15: Admitted, CT showed L thalamic ICH with intraventricular extension, reported taking 10 low-dose aspirin per day 5/17: developed respiratory distress, PCCM consulted, placed on BiPAP, started on steroids and antibiotics, repeat CTH no change 5/18: Off BiPAP 5/19: Nicardipine d/c'd, Cr slightly better with IV fluids 5/21: Phenobarb completed, respiratory status improving now, transitioned to PO steroids 5/22-5/23: Stable, pending CIR

## 2021-12-26 NOTE — Assessment & Plan Note (Signed)
At baseline he is independent, has no cognitive impairment.  On presentation he was confused due to IPH.

## 2021-12-27 ENCOUNTER — Inpatient Hospital Stay (HOSPITAL_COMMUNITY): Payer: Medicare HMO

## 2021-12-27 DIAGNOSIS — J441 Chronic obstructive pulmonary disease with (acute) exacerbation: Secondary | ICD-10-CM | POA: Diagnosis not present

## 2021-12-27 DIAGNOSIS — J9601 Acute respiratory failure with hypoxia: Secondary | ICD-10-CM | POA: Diagnosis not present

## 2021-12-27 DIAGNOSIS — I619 Nontraumatic intracerebral hemorrhage, unspecified: Secondary | ICD-10-CM | POA: Diagnosis not present

## 2021-12-27 DIAGNOSIS — N179 Acute kidney failure, unspecified: Secondary | ICD-10-CM | POA: Diagnosis not present

## 2021-12-27 LAB — GLUCOSE, CAPILLARY
Glucose-Capillary: 121 mg/dL — ABNORMAL HIGH (ref 70–99)
Glucose-Capillary: 114 mg/dL — ABNORMAL HIGH (ref 70–99)
Glucose-Capillary: 140 mg/dL — ABNORMAL HIGH (ref 70–99)
Glucose-Capillary: 108 mg/dL — ABNORMAL HIGH (ref 70–99)

## 2021-12-27 LAB — BASIC METABOLIC PANEL
Anion gap: 11 (ref 5–15)
BUN: 35 mg/dL — ABNORMAL HIGH (ref 8–23)
CO2: 16 mmol/L — ABNORMAL LOW (ref 22–32)
Calcium: 8.5 mg/dL — ABNORMAL LOW (ref 8.9–10.3)
Chloride: 111 mmol/L (ref 98–111)
Creatinine, Ser: 1.68 mg/dL — ABNORMAL HIGH (ref 0.61–1.24)
GFR, Estimated: 44 mL/min — ABNORMAL LOW (ref >60)
Glucose, Bld: 136 mg/dL — ABNORMAL HIGH (ref 70–99)
Potassium: 4.3 mmol/L (ref 3.5–5.1)
Sodium: 138 mmol/L (ref 135–145)

## 2021-12-27 IMAGING — MR MR HEAD WO/W CM
13 of 15 series · 40 of 48 positions shown · IV contrast (Gadavist)
Comparison: None Available.

CLINICAL DATA: Stroke, follow up; Stroke/TIA, determine embolic
source

EXAM:
MRI HEAD WITHOUT AND WITH CONTRAST
MRA HEAD WITHOUT CONTRAST
TECHNIQUE: Multiplanar, multi-echo pulse sequences of the brain and surrounding
structures were acquired without and with intravenous contrast.
Angiographic images of the Circle of Willis were acquired using MRA
technique without intravenous contrast.
CONTRAST:  7.5mL GADAVIST GADOBUTROL 1 MMOL/ML IV SOLN

[Series 5: DWI · axial · 3.0mm · 0.88mm/px · z∈[-92,+54]mm · 3 of 45 slices shown (1 of 4)]
[im 1/45]
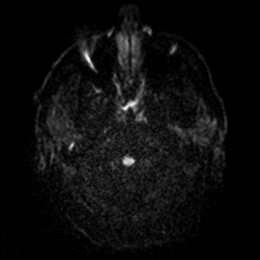
[im 23/45]
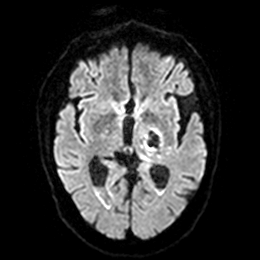
[im 45/45]
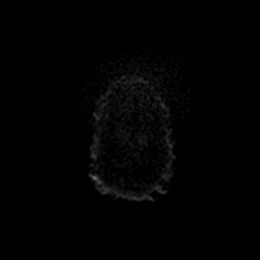

[Series 6: DWI · axial · 3.0mm · 0.88mm/px · z∈[-92,+54]mm · 4 of 50 slices shown (2 of 4)]
[im 1/50]
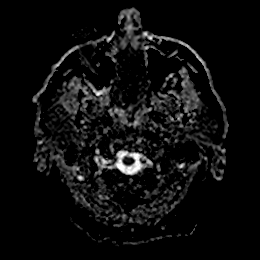
[im 17/50]
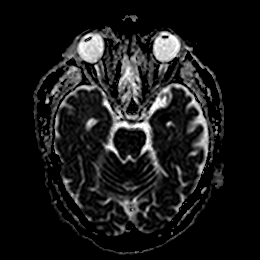
[im 33/50]
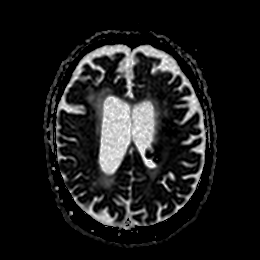
[im 50/50]
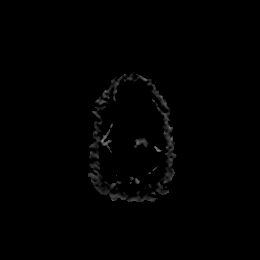

[Series 7: DWI · coronal · 4.0mm · 0.88mm/px · 3 of 34 slices shown (3 of 4)]
[im 1/34]
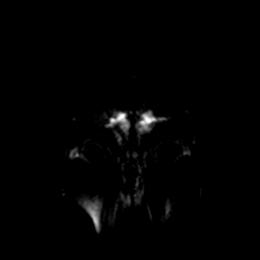
[im 17/34]
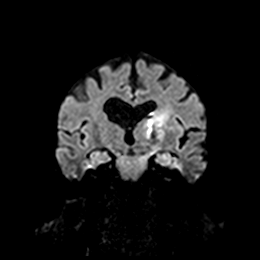
[im 34/34]
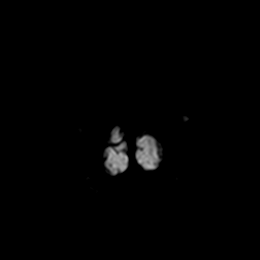

[Series 8: DWI · coronal · 4.0mm · 0.88mm/px · 3 of 34 slices shown (4 of 4)]
[im 1/34]
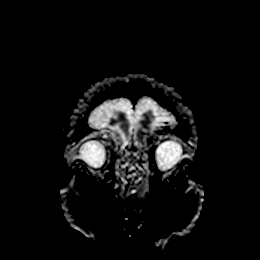
[im 17/34]
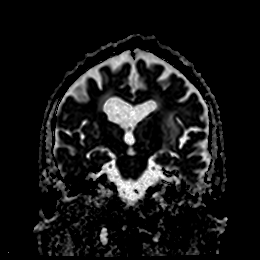
[im 34/34]
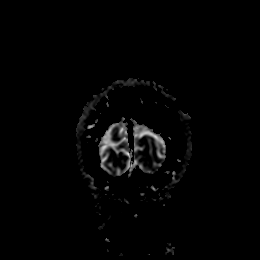

[Series 9: T1 · sagittal · 5.0mm · 0.75mm/px · 2 of 23 slices shown]
[im 1/23]
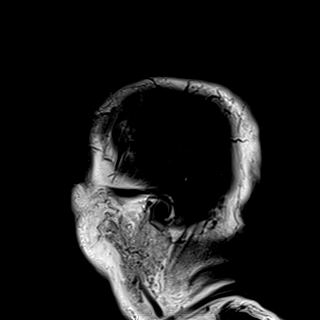
[im 23/23]
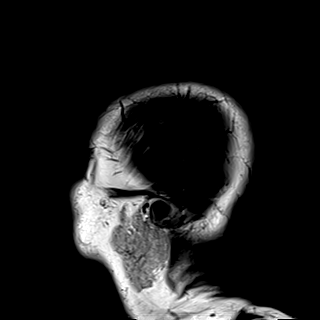

[Series 10: T2 · axial · 5.0mm · 0.72mm/px · z∈[-91,+52]mm · 2 of 24 slices shown (1 of 2)]
[im 1/24]
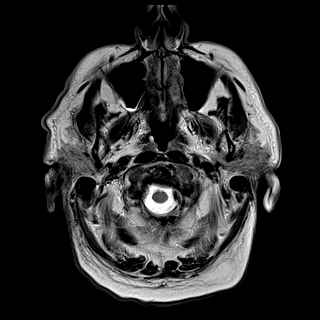
[im 24/24]
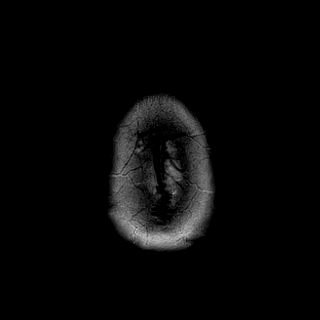

[Series 11: FLAIR · axial · 5.0mm · 0.45mm/px · z∈[-91,+52]mm · 2 of 25 slices shown]
[im 1/25]
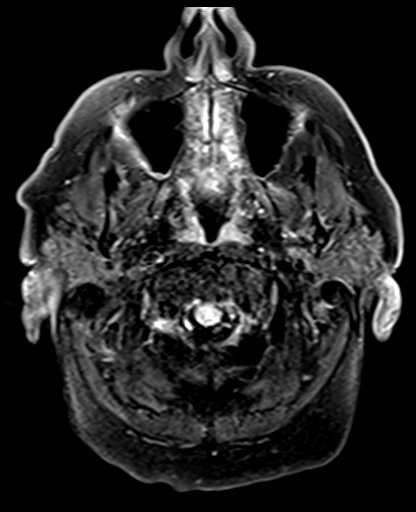
[im 25/25]
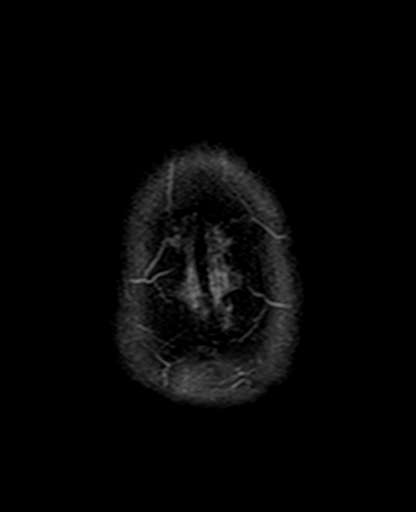

[Series 12: mag_images · axial · 3.0mm · 0.90mm/px · z∈[-101,+63]mm · 5 of 56 slices shown]
[im 1/56]
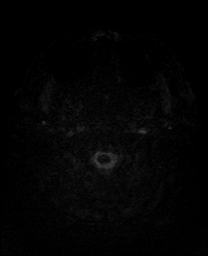
[im 14/56]
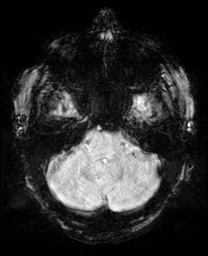
[im 28/56]
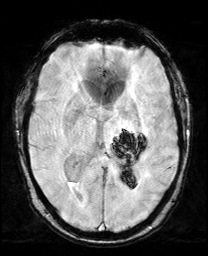
[im 42/56]
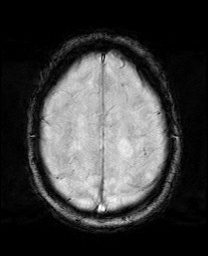
[im 56/56]
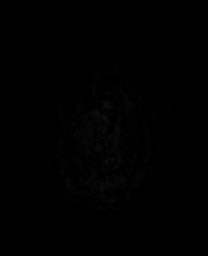

[Series 13: pha_images · axial · 3.0mm · 0.90mm/px · z∈[-101,+63]mm · 5 of 55 slices shown]
[im 1/55]
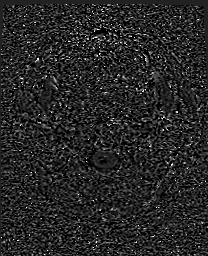
[im 14/55]
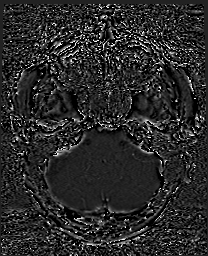
[im 28/55]
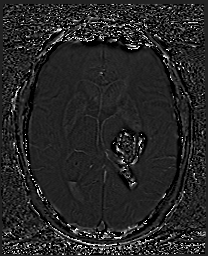
[im 41/55]
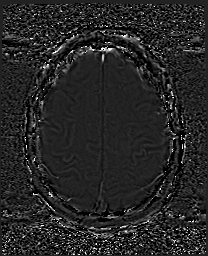
[im 55/55]
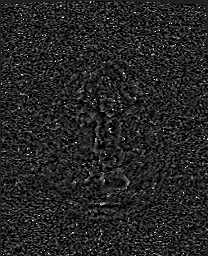

[Series 14: swi_images · axial · 3.0mm · 0.90mm/px · z∈[-101,+63]mm · 5 of 56 slices shown]
[im 1/56]
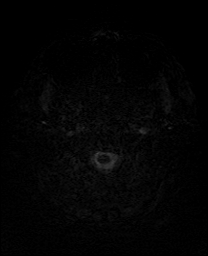
[im 14/56]
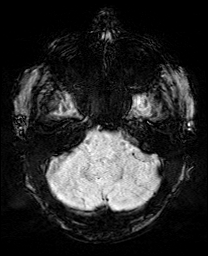
[im 28/56]
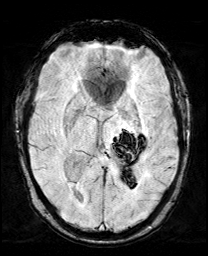
[im 42/56]
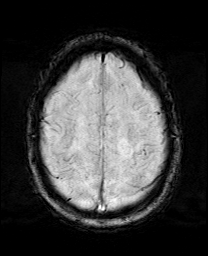
[im 56/56]
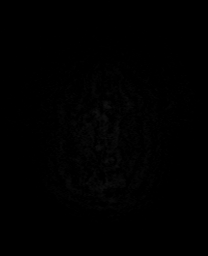

[Series 17: T2 · coronal · 5.0mm · 0.34mm/px · 2 of 29 slices shown (2 of 2)]
[im 1/29]
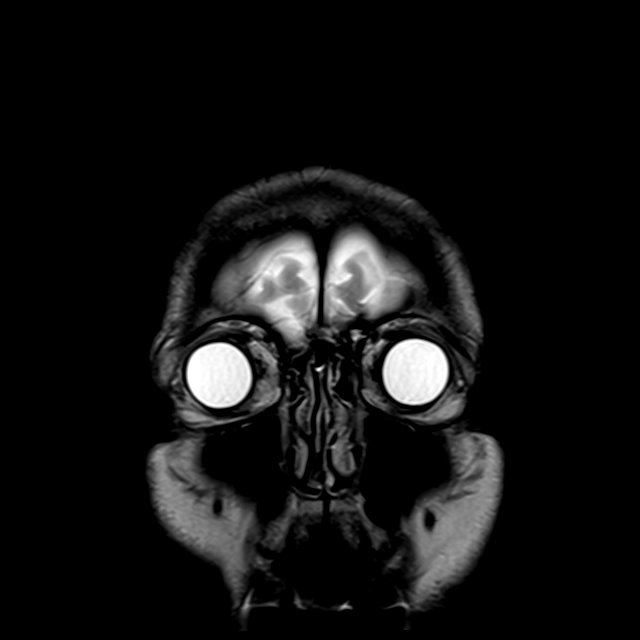
[im 29/29]
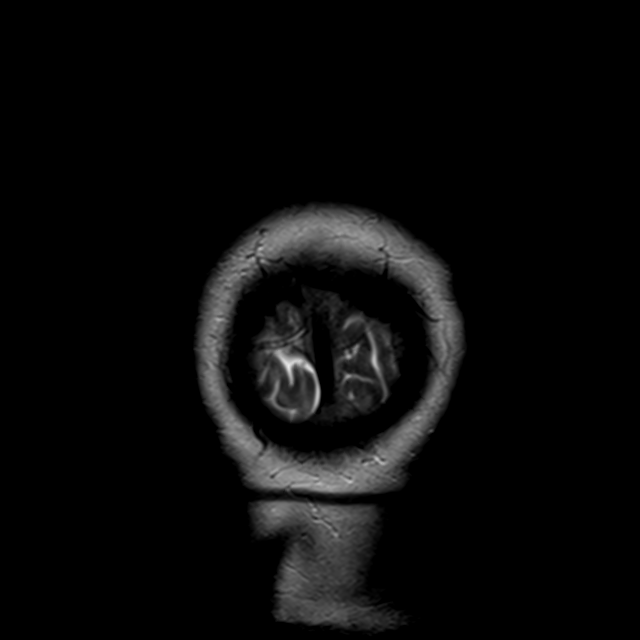

[Series 23: T1 post-contrast · coronal · 5.0mm · 0.34mm/px · 2 of 28 slices shown (1 of 2)]
[im 1/28]
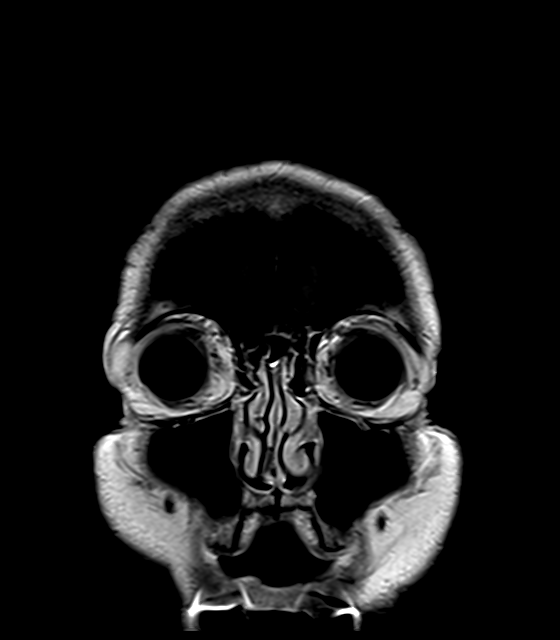
[im 28/28]
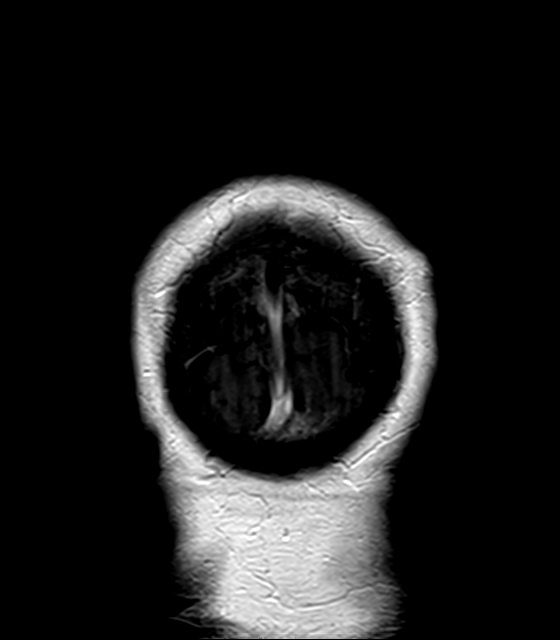

[Series 24: T1 post-contrast · sagittal · 5.0mm · 0.72mm/px · 2 of 23 slices shown (2 of 2)]
[im 1/23]
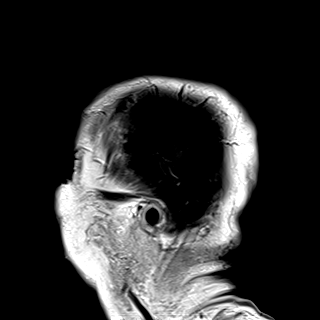
[im 23/23]
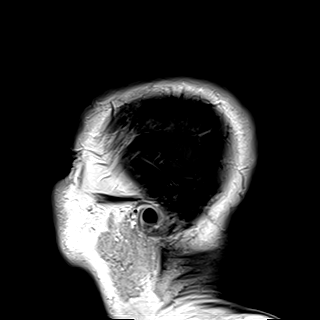

[40 of 48 positions shown; findings below may reference images not displayed]

FINDINGS: MRI HEAD FINDINGS

Brain: Acute and early subacute hemorrhage centered within the left
thalamus and involving the adjacent white matter including the
posterior limb of the internal capsule. Surrounding edema is present
with mass effect on the adjacent left lateral ventricle.
Intraventricular extension is noted with hemorrhage layering within
the occipital horns.

Evaluation for enhancement is somewhat limited by intrinsic T1
shortening, but there is no evidence of an underlying lesion.

No evidence of prior hemorrhage remote from this site. Patchy and
confluent areas of T2 hyperintensity in the supratentorial and
pontine white matter are nonspecific but may reflect mild to
moderate chronic microvascular ischemic changes. Prominence of the
ventricles and sulci reflects parenchymal volume loss. No
hydrocephalus.

Vascular: Major vessel flow voids at the skull base are preserved.

Skull and upper cervical spine: Normal marrow signal.

Sinuses/Orbits: Minor paranasal sinus mucosal thickening. Orbits are
unremarkable.

Other: Patchy bilateral mastoid fluid opacification. Sella is
unremarkable.

MRA HEAD FINDINGS

Anterior circulation: Intracranial internal carotid arteries are
patent with atherosclerotic irregularity. Anterior and middle
cerebral arteries are patent.

Posterior circulation: Included intracranial vertebral arteries are
patent. Basilar artery is patent. Posterior cerebral arteries are
patent. There is fetal origin of the right PCA.
IMPRESSION: Hemorrhage centered within the left thalamus and adjacent white
matter as seen on recent prior CT imaging. Surrounding edema with
mass effect and intraventricular extension are similar. No evidence
of an underlying lesion.

Mild intracranial atherosclerosis. Vascular imaging is otherwise
unremarkable.

## 2021-12-27 IMAGING — MR MR MRA HEAD W/O CM
1 series · 18 of 48 positions shown · IV contrast (gadavist)
Comparison: None Available.

CLINICAL DATA: Stroke, follow up; Stroke/TIA, determine embolic
source

EXAM:
MRI HEAD WITHOUT AND WITH CONTRAST
MRA HEAD WITHOUT CONTRAST
TECHNIQUE: Multiplanar, multi-echo pulse sequences of the brain and surrounding
structures were acquired without and with intravenous contrast.
Angiographic images of the Circle of Willis were acquired using MRA
technique without intravenous contrast.
CONTRAST:  7.5mL GADAVIST GADOBUTROL 1 MMOL/ML IV SOLN

[Series 9: 3d cow · axial · 0.5mm · 0.41mm/px · z∈[-81,+0]mm · 18 of 172 slices shown]
[im 1/172]
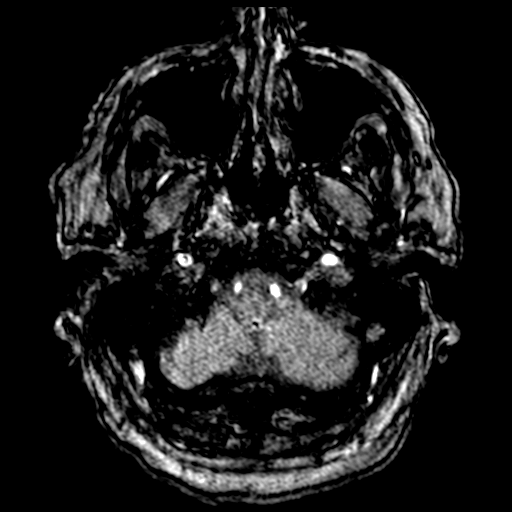
[im 4/172]
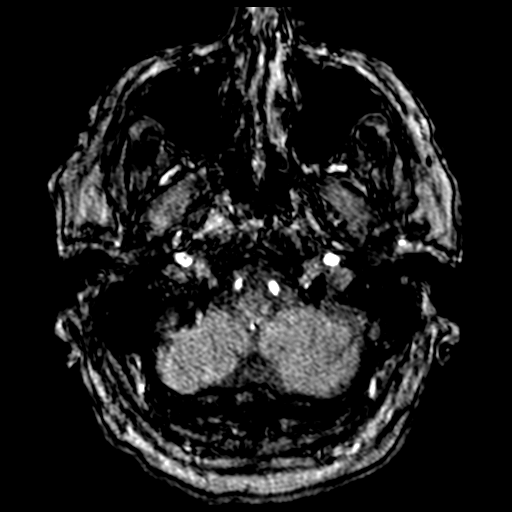
[im 8/172]
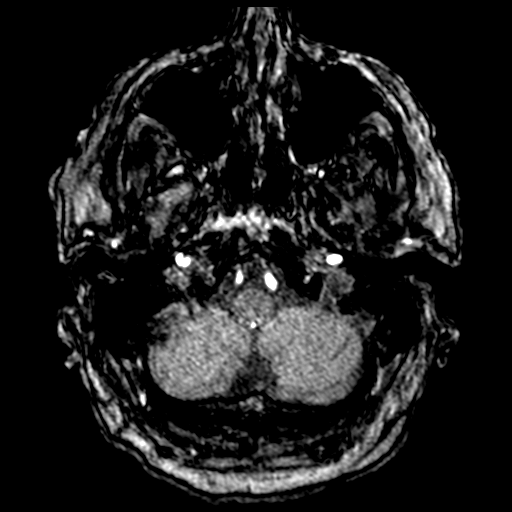
[im 11/172]
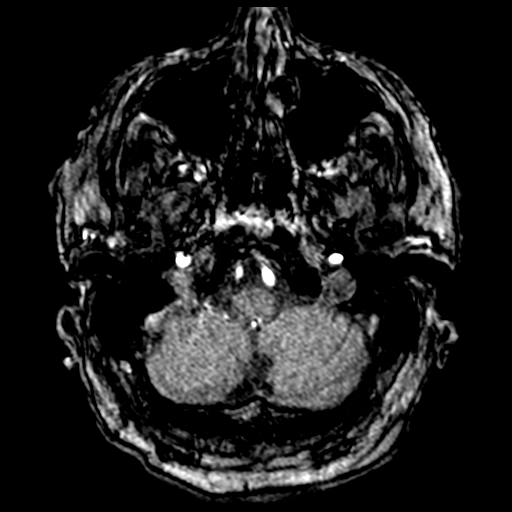
[im 15/172]
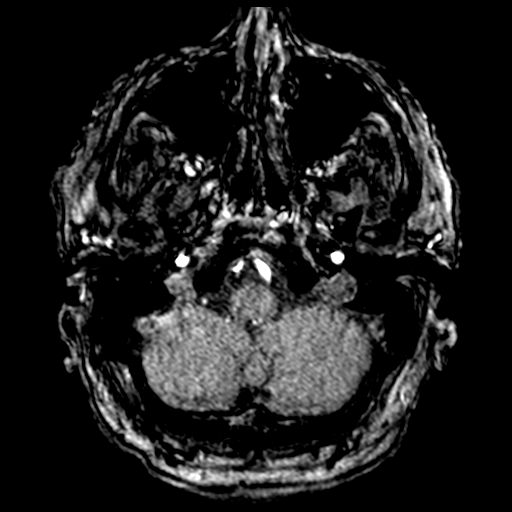
[im 19/172]
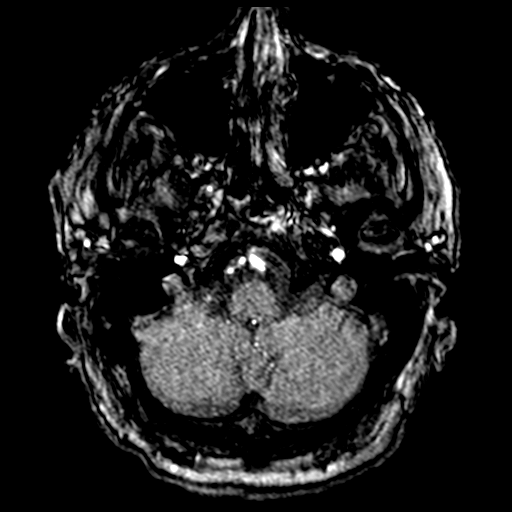
[im 22/172]
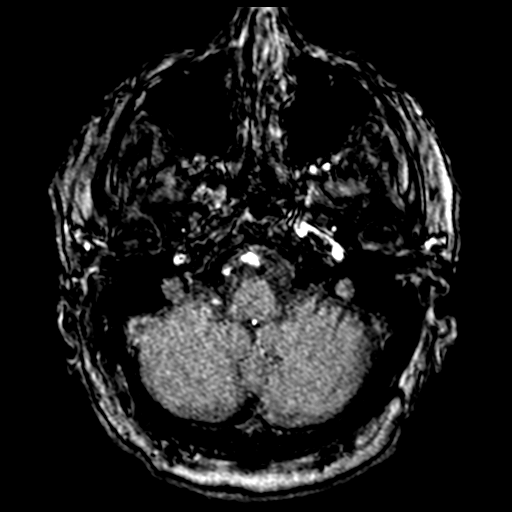
[im 26/172]
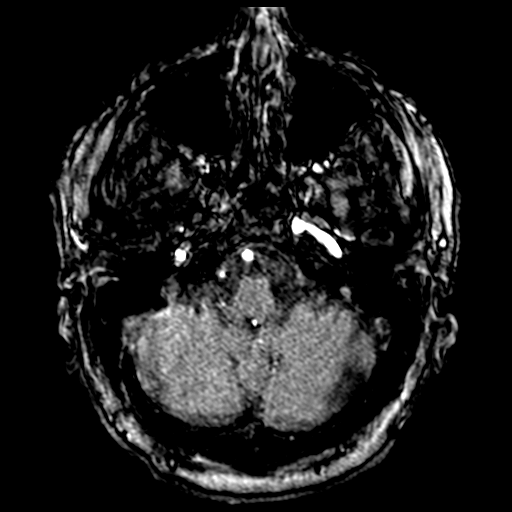
[im 30/172]
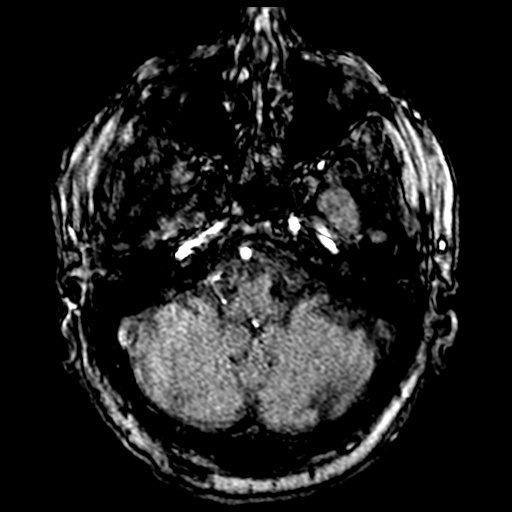
[im 33/172]
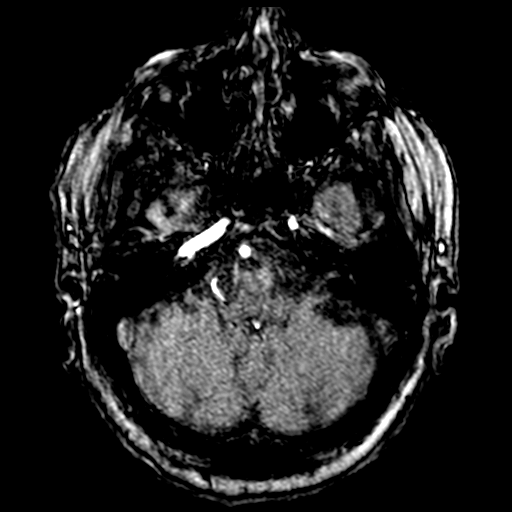
[im 55/172]
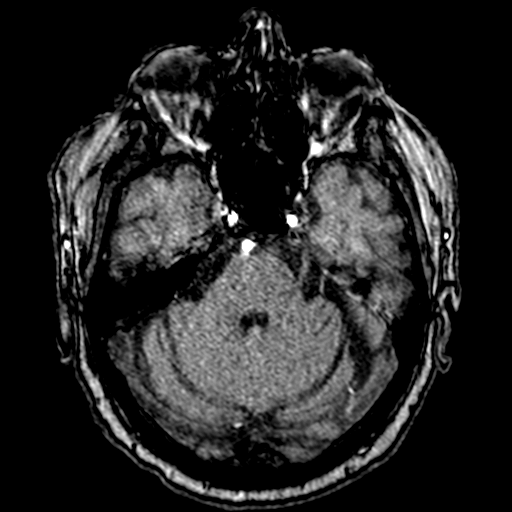
[im 77/172]
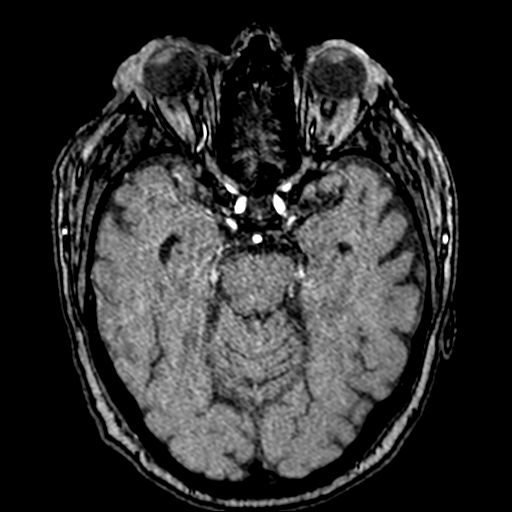
[im 88/172]
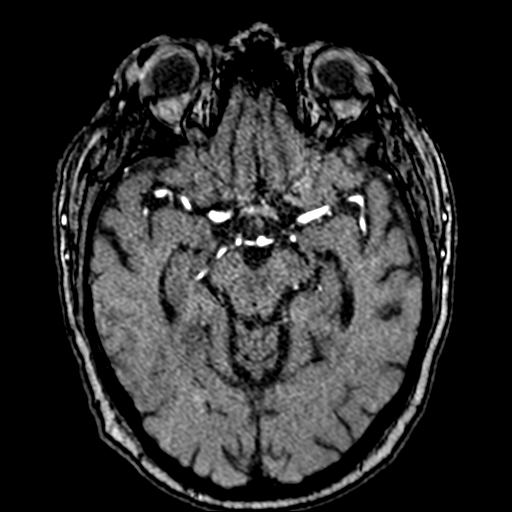
[im 99/172]
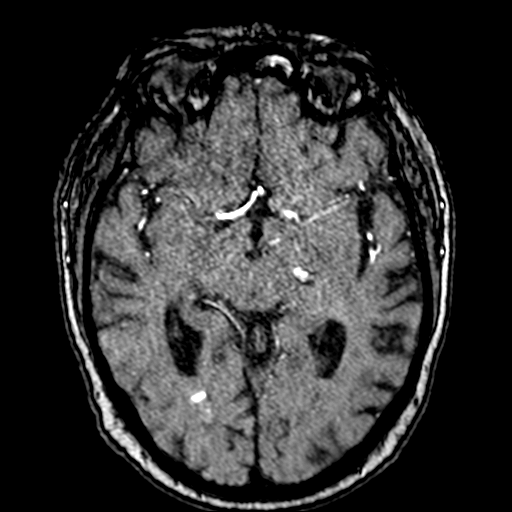
[im 121/172]
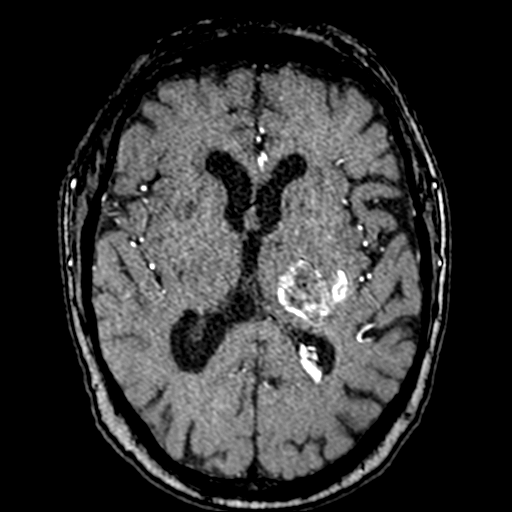
[im 142/172]
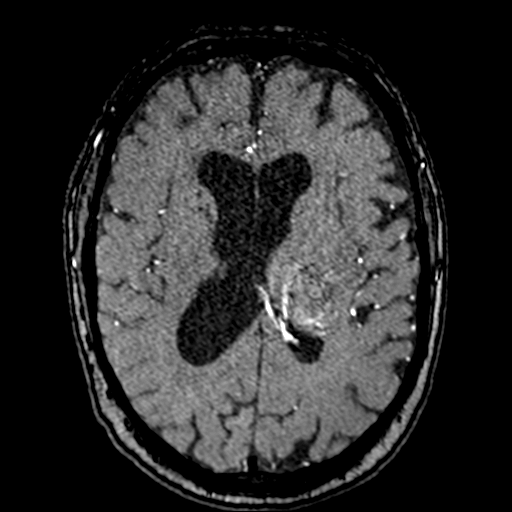
[im 146/172]
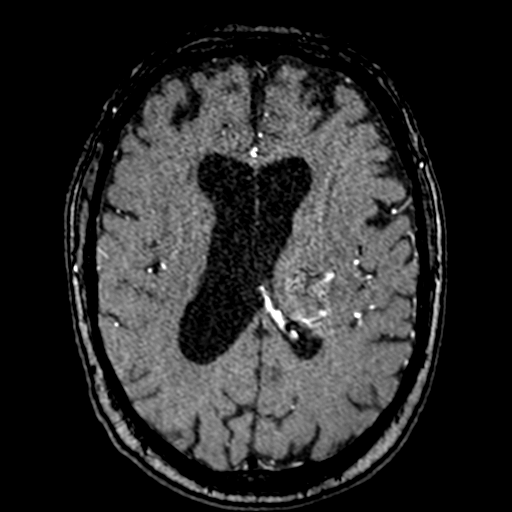
[im 164/172]
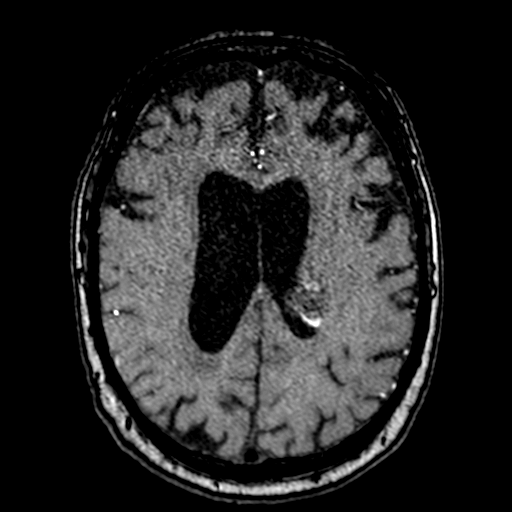

[18 of 48 positions shown; findings below may reference images not displayed]

FINDINGS: MRI HEAD FINDINGS

Brain: Acute and early subacute hemorrhage centered within the left
thalamus and involving the adjacent white matter including the
posterior limb of the internal capsule. Surrounding edema is present
with mass effect on the adjacent left lateral ventricle.
Intraventricular extension is noted with hemorrhage layering within
the occipital horns.

Evaluation for enhancement is somewhat limited by intrinsic T1
shortening, but there is no evidence of an underlying lesion.

No evidence of prior hemorrhage remote from this site. Patchy and
confluent areas of T2 hyperintensity in the supratentorial and
pontine white matter are nonspecific but may reflect mild to
moderate chronic microvascular ischemic changes. Prominence of the
ventricles and sulci reflects parenchymal volume loss. No
hydrocephalus.

Vascular: Major vessel flow voids at the skull base are preserved.

Skull and upper cervical spine: Normal marrow signal.

Sinuses/Orbits: Minor paranasal sinus mucosal thickening. Orbits are
unremarkable.

Other: Patchy bilateral mastoid fluid opacification. Sella is
unremarkable.

MRA HEAD FINDINGS

Anterior circulation: Intracranial internal carotid arteries are
patent with atherosclerotic irregularity. Anterior and middle
cerebral arteries are patent.

Posterior circulation: Included intracranial vertebral arteries are
patent. Basilar artery is patent. Posterior cerebral arteries are
patent. There is fetal origin of the right PCA.
IMPRESSION: Hemorrhage centered within the left thalamus and adjacent white
matter as seen on recent prior CT imaging. Surrounding edema with
mass effect and intraventricular extension are similar. No evidence
of an underlying lesion.

Mild intracranial atherosclerosis. Vascular imaging is otherwise
unremarkable.

## 2021-12-27 MED ORDER — GADOBUTROL 1 MMOL/ML IV SOLN
7.5000 mL | Freq: Once | INTRAVENOUS | Status: AC | PRN
  Administered 2021-12-27: 7.5 mL via INTRAVENOUS

## 2021-12-27 NOTE — Progress Notes (Signed)
STROKE TEAM PROGRESS NOTE   INTERVAL HISTORY Patient is seen in his room with his wife at the bedside.. He has been hemodynamically stable  No complaints today. No headache.  Vitals:   12/27/21 0358 12/27/21 0824 12/27/21 1108 12/27/21 1541  BP: (!) 150/79 (!) 149/76 (!) 150/65 133/66  Pulse: 79 83 81 73  Resp: 20 18 20 20   Temp: 98.6 F (37 C) 97.9 F (36.6 C) 97.8 F (36.6 C) (!) 97.3 F (36.3 C)  TempSrc: Oral Oral Oral Oral  SpO2: 96% 97% 96% 96%  Weight:      Height:       CBC:  Recent Labs  Lab 12/21/21 1737 12/21/21 1745 12/25/21 0555 12/26/21 0232  WBC 6.8   < > 8.9 9.3  NEUTROABS 4.6  --   --   --   HGB 14.8   < > 14.8 14.5  HCT 43.9   < > 44.1 44.1  MCV 89.0   < > 90.4 90.6  PLT 286   < > 244 278   < > = values in this interval not displayed.    Basic Metabolic Panel:  Recent Labs  Lab 12/26/21 0232 12/27/21 0038  NA 143 138  K 4.1 4.3  CL 113* 111  CO2 19* 16*  GLUCOSE 117* 136*  BUN 39* 35*  CREATININE 2.00* 1.68*  CALCIUM 8.9 8.5*    Lipid Panel:  Recent Labs  Lab 12/21/21 1737 12/23/21 0607 12/25/21 0555  CHOL 321*  --   --   TRIG 339*   < > 213*  HDL 61  --   --   CHOLHDL 5.3  --   --   VLDL 68*  --   --   LDLCALC 192*  --   --    < > = values in this interval not displayed.    HgbA1c:  Recent Labs  Lab 12/21/21 1737  HGBA1C 6.1*    Urine Drug Screen:  Recent Labs  Lab 12/22/21 0757  LABOPIA NONE DETECTED  COCAINSCRNUR NONE DETECTED  LABBENZ NONE DETECTED  AMPHETMU NONE DETECTED  THCU NONE DETECTED  LABBARB NONE DETECTED     Alcohol Level  Recent Labs  Lab 12/21/21 2032  ETH <10     IMAGING past 24 hours No results found.  PHYSICAL EXAM  Physical Exam  Constitutional: Appears well-developed and well-nourished.  Cardiovascular: Normal rate and regular rhythm.  Respiratory: Unlabored respirations on supplemental O2 via   Neuro: Mental Status: Patient is alert and oriented x2 Cranial Nerves: II:  Visual Fields are full. Pupils are equal, round, and reactive to light.   III,IV, VI: EOMI without ptosis or diploplia.  V: Facial sensation symmetrical VII: Smile is symmetrical VIII: Hearing is intact to voice Motor: Tone is normal. Bulk is normal.  Left hemibody 5/5, follows commands Right UE: 1/5 Right LE: 2/5, follows commands Sensory: Sensation is symmetric to light touch in the arms and legs.  Cerebellar: FTN intact on left    ASSESSMENT/PLAN Mr. Lysander Pendley is a 71 y.o. male with history of ETOH use (at least 1 pint per day), COPD, HTN, tobacco use (2-3 packs per day), and vitamin D deficiency presenting with right sided weakness, right facial droop. CIWA protocol initiated with ativan, last drink 5/14 evening. MRI pending. CTA shows severe stenosis. PT, OT pending. MRI pending. Dysphgia 3 diet ordered per SLP.  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 Mild surrounding edema and local mass effect without significant midline shift. 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 Bilateral carotid bifurcation atherosclerosis without greater than 50% stenosis. Follow up CT 5/17 Unchanged size of left thalamic IPH MRI not able to perform due to respiratory distress 2D Echo EF 65-70% LDL 192 HgbA1c 6.1 VTE prophylaxis - SCDs Reportedly taking 812mg  of ASA daily for pain prior to admission, now on No antithrombotic due to [place] Therapy recommendations: CIR Disposition:  Pending   Hypertensive emergency-resolved. Home meds:  Lisinopril 20mg , amlodipine 10mg , atenolol 50mg  IV cleviprex -> changed to nicardipine due to  elevated triglycerides Resume home amlodipine, atenolol  onhydralazine 50->100 every 8h  Lisinopril stopped due to AKI Long-term BP goal normotensive BP goal less than 160  Hyperlipidemia Home meds:  Crestor 20mg  (not taking) LDL 192, goal < 70 Consider Crestor 40 at discharge  Diabetes type II Controlled Home meds:  Metformin HgbA1c 6.1, goal < 7.0 CBGs SSI Close PCP follow-up  Dysphagia SLP following Dysphagia 3 diet, thin liquid  BP meds resumed MBS today with speech therapy  Tobacco abuse Current smoker, smoking 2-3ppd Smoking cessation counseling provided Nicotine patch provided Pt is willing to quit  Alcohol abuse Heavy drinker, currently drinking 1 pint of tequila per day CIWA protocol initiated with ativan B1/FA/MVI  AKI on LR IVFs. Hyperkalemia Cr 1.20->2.22->2.39->2.28 -> 2.38 K+ 5.3->5.0->3.9    Other Stroke Risk Factors DVT prophy: heparin 5,000 units q 8hrs.  Other Active Problems Acetylsalicylic Acid Overuse Reportedly taking 812mg  of ASA daily (2 325mg  and 2 81mg  pills)  Hospital day # 93  71 year old gentleman with history of alcohol use tobacco use COPD hypertension with left subcortical intraparenchymal hemorrhage transferred out of the ICU on Friday. He is doing well his exam is stable.  PT recommending inpatient rehab.  Will need reevaluation by speech therapy.  Will need MRI brain scan (ordered)  No antiplatelets at this time due to intracranial hemorrhage.   Total 36 minutes spent on counseling patient and coordinating care, writing notes and reviewing chart.  Venola Castello,MD  After hours, contact General Neurology

## 2021-12-27 NOTE — Progress Notes (Signed)
  Progress Note   Patient: Ronnie Jones KYH:[id number] DOB: 13-Jul-1951 DOA: 12/21/2021     6 DOS: the patient was seen and examined on 12/27/2021 at 11:05AM      Brief hospital course: 71 year old man with advanced COPD presented with parenchymal thalamic hemorrhage.  Complicated by COPD exacerbation and pneumonia.     Assessment and Plan: * Intraparenchymal hemorrhage of brain (HCC) -Continue atenolol, amlodipine, hydralazine - Consult neurology    Hypertensive emergency BP controlled -Continue atenolol, amlodipine, hydralazine    COPD with acute exacerbation (HCC) -Continue Solu-Medrol, Anoro, ipratropium DuoNeb, PPI   Aspiration pneumonia (HCC) -Continue Unasyn  AKI (acute kidney injury) (HCC) Kidney function better today  Alcohol use disorder Developed complicated withdrawals, improving on phenobarbital -Continue phenobarbital    DM type 2 (diabetes mellitus, type 2) (HCC) Glucose controlled - Continue sliding scale correction           Subjective: Still very out of breath.  Somewhat wheezy.     Physical Exam: Vitals:   12/27/21 0358 12/27/21 0824 12/27/21 1108 12/27/21 1541  BP: (!) 150/79 (!) 149/76 (!) 150/65 133/66  Pulse: 79 83 81 73  Resp: 20 18 20 20   Temp: 98.6 F (37 C) 97.9 F (36.6 C) 97.8 F (36.6 C) (!) 97.3 F (36.3 C)  TempSrc: Oral Oral Oral Oral  SpO2: 96% 97% 96% 96%  Weight:      Height:       Adult male, lying in bed, appears tired and out of breath Tachycardic, regular, no JVD or leg swelling Respiratory rate increased, mild wheezing, bilaterally, abdomen soft without tenderness palpation or guarding Attention normal, affect normal, judgment insight appear normal Paralyzed on the right, right facial droop     Data Reviewed:    Family Communication: Wife at the bedside    Disposition: Status is: Inpatient         Author: , MD 12/27/2021 4:48 PM  For on call review  [email or web address].

## 2021-12-28 DIAGNOSIS — E54 Ascorbic acid deficiency: Secondary | ICD-10-CM

## 2021-12-28 DIAGNOSIS — N179 Acute kidney failure, unspecified: Secondary | ICD-10-CM | POA: Diagnosis not present

## 2021-12-28 DIAGNOSIS — I619 Nontraumatic intracerebral hemorrhage, unspecified: Secondary | ICD-10-CM | POA: Diagnosis not present

## 2021-12-28 DIAGNOSIS — J9601 Acute respiratory failure with hypoxia: Secondary | ICD-10-CM | POA: Diagnosis not present

## 2021-12-28 DIAGNOSIS — J441 Chronic obstructive pulmonary disease with (acute) exacerbation: Secondary | ICD-10-CM | POA: Diagnosis not present

## 2021-12-28 LAB — CBC
HCT: 42.1 % (ref 39.0–52.0)
Hemoglobin: 14.1 g/dL (ref 13.0–17.0)
MCH: 30.1 pg (ref 26.0–34.0)
MCHC: 33.5 g/dL (ref 30.0–36.0)
MCV: 90 fL (ref 80.0–100.0)
Platelets: 211 10*3/uL (ref 150–400)
RBC: 4.68 MIL/uL (ref 4.22–5.81)
RDW: 13.3 % (ref 11.5–15.5)
WBC: 7.4 10*3/uL (ref 4.0–10.5)
nRBC: 0 % (ref 0.0–0.2)

## 2021-12-28 LAB — BASIC METABOLIC PANEL
Anion gap: 11 (ref 5–15)
BUN: 35 mg/dL — ABNORMAL HIGH (ref 8–23)
CO2: 16 mmol/L — ABNORMAL LOW (ref 22–32)
Calcium: 8.3 mg/dL — ABNORMAL LOW (ref 8.9–10.3)
Chloride: 111 mmol/L (ref 98–111)
Creatinine, Ser: 1.65 mg/dL — ABNORMAL HIGH (ref 0.61–1.24)
GFR, Estimated: 45 mL/min — ABNORMAL LOW (ref >60)
Glucose, Bld: 128 mg/dL — ABNORMAL HIGH (ref 70–99)
Potassium: 4 mmol/L (ref 3.5–5.1)
Sodium: 138 mmol/L (ref 135–145)

## 2021-12-28 LAB — GLUCOSE, CAPILLARY
Glucose-Capillary: 117 mg/dL — ABNORMAL HIGH (ref 70–99)
Glucose-Capillary: 136 mg/dL — ABNORMAL HIGH (ref 70–99)
Glucose-Capillary: 181 mg/dL — ABNORMAL HIGH (ref 70–99)
Glucose-Capillary: 77 mg/dL (ref 70–99)

## 2021-12-28 LAB — VITAMIN A: Vitamin A (Retinoic Acid): 91.8 ug/dL — ABNORMAL HIGH (ref 22.0–69.5)

## 2021-12-28 LAB — VITAMIN C: Vitamin C: <0.1 mg/dL — ABNORMAL LOW (ref 0.4–2.0)

## 2021-12-28 LAB — VITAMIN B6

## 2021-12-28 MED ORDER — ROSUVASTATIN CALCIUM 20 MG PO TABS
40.0000 mg | ORAL_TABLET | Freq: Every day | ORAL | Status: DC
  Administered 2021-12-28 – 2021-12-30 (×3): 40 mg via ORAL
  Filled 2021-12-28 (×3): qty 2

## 2021-12-28 MED ORDER — ASCORBIC ACID 500 MG PO TABS
500.0000 mg | ORAL_TABLET | Freq: Every day | ORAL | Status: DC
  Administered 2021-12-28 – 2021-12-31 (×4): 500 mg via ORAL
  Filled 2021-12-28 (×4): qty 1

## 2021-12-28 MED ORDER — PREDNISONE 20 MG PO TABS
40.0000 mg | ORAL_TABLET | Freq: Every day | ORAL | Status: DC
Start: 2021-12-29 — End: 2021-12-29
  Administered 2021-12-29: 40 mg via ORAL
  Filled 2021-12-28: qty 2

## 2021-12-28 MED ORDER — MELATONIN 3 MG PO TABS
3.0000 mg | ORAL_TABLET | Freq: Every day | ORAL | Status: DC
  Administered 2021-12-28 – 2021-12-30 (×3): 3 mg via ORAL
  Filled 2021-12-28 (×3): qty 1

## 2021-12-28 NOTE — Progress Notes (Signed)
Progress Note   Patient: Ronnie Jones JTT:[id number] DOB: 1951-04-27 DOA: 12/21/2021     7 DOS: the patient was seen and examined on 12/28/2021        Brief hospital course: Ronnie Jones is a 71 y.o. M with DM, HTN, COPD FEV1 46% by PFT, 23% by spirometry in 2019, alcohol dependence, still smoking, lung nodules who presented with RIGHT hemiparesis.  Wife found him at home in a chair, right sided weakness, right facial droop.    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  Repeat MRI on 5/21 showed normal evolution, no worsening.   Echo without cardiogenic source.  Noninvasive angiography showed bilateral vertebral disease.  Carotid imaging normal.  - Aspirin held given intraparenchymal hemorrhage - Lipids ordered, LDL 192, start Crestor 40 mg nightly - Continue blood pressure control, amlodipine, atenolol, hydralazine    -Atrial fibrillation: Not present on imaging -Dysphagia screen ordered in ER -PT eval ordered: recommended patient rehab -Smoking cessation: Counseling given     Hypertensive emergency Blood pressures up to 190s systolic and 130s diastolic on admission with accompanying intraparenchymal hemorrhage.  Blood pressures now been controlled - Continue atenolol, amlodipine, hydralazine  COPD with acute exacerbation (HCC) This appears to be improving - Transition to prednisone, plan for 9 to 10-day taper - Continue ICS/LABA - Continue DuoNeb - Continue PPI  Acute respiratory failure with  hypoxia (HCC) At baseline does not use O2.  On 5/16 and 5/17 was persistently hypoxic to the 80s and had respiratory rate up to the 30s, placed on BiPAP.  Aspiration pneumonia (HCC) Procal neg and CXR clear.  Pulm started antibiotics, he completed 5 days.   AKI (acute kidney injury) (HCC) CKD ruled out.  No clear prior chronic kidney disease.  Baseline 2019 0.6-0.7, here was 1.3 on admission, up to 2-2.3 over the last several days in setting of BP lowering  Urine with proteinuria, acellular.   His blood pressure was lowered and he had not some IV fluids his creatinine is down to 1.6 and stabilized there. - Follow-up creatinine as an outpatient  Vitamin C deficiency Vitamin C level low - Start vitamin C - Repeat level in 6 months  Alcohol use disorder Developed complicated withdrawals, improving on phenobarbital  Vitamin D deficiency B12, zinc, copper normal.   - Continue ergocalciferol weekly - Check level in 6 months - Follow vitamin A  Solid nodule of lung 6 mm to 8 mm in diameter Previously known.   - Outpatient follow up  Tobacco abuse - Smoking cessation recommended   DM type 2 (diabetes mellitus, type 2) (HCC) Glucose controlled - Continue SS corrections   Dysphagia Doing well  Normal anion gap metabolic acidosis Likely from AKI - Check BMP and VBG tomorrow  DTs (delirium tremens) (HCC) This has resolved - Complete phenobarbital taper today  Acute metabolic encephalopathy At baseline he is independent, has no cognitive impairment.  On presentation he was confused due to IPH.            Subjective: Patient's breathing is somewhat better he is still overall tired, still paralyzed on  the right, still very depressed about his current situation.     Physical Exam: Vitals:   12/28/21 0451 12/28/21 0845 12/28/21 1152 12/28/21 1514  BP: (!) 157/84 123/64 131/70 121/69  Pulse: 79 72 61 70  Resp: 19 18 20 20   Temp: 98.7 F (37.1 C) 98.4 F (36.9  C) 98.7 F (37.1 C) 97.7 F (36.5 C)  TempSrc:  Oral Oral Oral  SpO2: 97% 96% 96% 97%  Weight:      Height:       Brown skin adult male, appears tired, sitting in bed, interactive and appropriate RRR, no murmurs, no peripheral edema Respiratory rate seems normal today, he has some wheezing, bilaterally, this is about stable from yesterday Abdomen soft without tenderness palpation or guarding, no ascites or distention Attention normal, affect blunted, judgment insight appear normal, he appears overall probably depressed, he is densely hemiparetic on the right  Data Reviewed: Discussed with neurology, nursing notes reviewed, vital signs reviewed Basic metabolic panel, vitamin C level, vitamin B6 level reviewed Complete blood count reviewed MRI report reviewed  Family Communication: Wife at the bedside    Disposition: Status is: Inpatient         Author: , MD 12/28/2021 6:27 PM  For on call review [email or web address].

## 2021-12-28 NOTE — Care Management Important Message (Signed)
Important Message  Patient Details  Name: Ronnie Jones MRN: [id number] Date of Birth: 1950-12-12   Medicare Important Message Given:  Yes     Sherryll Skoczylas Stefan Church 12/28/2021, 3:40 PM

## 2021-12-28 NOTE — Assessment & Plan Note (Signed)
Vitamin C level low - Start vitamin C - Repeat level in 6 months

## 2021-12-28 NOTE — Progress Notes (Signed)
Inpatient Rehabilitation Admissions Coordinator   I will begin Auth with Monia Pouch once I have updated therapy notes from today. Last OT was 5/16 and treatments have to be within 48 hrs to begin Auth.  Ottie Glazier, RN, MSN Rehab Admissions Coordinator [phone] 12/28/2021 11:51 AM

## 2021-12-28 NOTE — Progress Notes (Signed)
Occupational Therapy Treatment Patient Details Name: Ronnie Jones MRN: [id number] DOB: [birth year] Today's Date: 12/28/2021   History of present illness Ronnie Jones is a 71 year old gentleman who presented via EMS as a Code Stroke after he was found at home with right sided weakness, sitting in his chair, by his wife. CT showed L subcortical ICH. PMH:  ETOH use (at least 1 pint per day), COPD, HTN, tobacco use (2-3 packs per day), and vitamin D deficiency.   OT comments  Pt progressing towards goals this session, able to perform UB bathing task using LUE with min-mod A. Performed PROM to RUE, no active movement noted. Encouraged pt to use LUE to self ROM RUE wrist and hand, pt verbalized understanding. Pt able to weight bear through R elbow x3 during session with mod A to recorrect to midline sitting position. Pt mod A +2 for sit to stand transfer x2, verbal cues needed for upright posture, tactile cues for weigh shifting R to L side. Pt presenting with impairments listed below, will follow acutely. Continue to recommend AIR at d/c to promote independence with ADLs and functional mobility.   Recommendations for follow up therapy are one component of a multi-disciplinary discharge planning process, led by the attending physician.  Recommendations may be updated based on patient status, additional functional criteria and insurance authorization.    Follow Up Recommendations  Acute inpatient rehab (3hours/day)    Assistance Recommended at Discharge Frequent or constant Supervision/Assistance  Patient can return home with the following  A lot of help with walking and/or transfers;A lot of help with bathing/dressing/bathroom;Assistance with cooking/housework;Assistance with feeding;Direct supervision/assist for medications management;Direct supervision/assist for financial management;Assist for transportation;Help with stairs or ramp for entrance   Equipment Recommendations  None recommended by  OT;Other (comment) (defer to next venue of care)    Recommendations for Other Services Rehab consult    Precautions / Restrictions Precautions Precautions: Fall Precaution Comments: R hemiparesis Restrictions Weight Bearing Restrictions: No       Mobility Bed Mobility               General bed mobility comments: up in chair upon arrival    Transfers Overall transfer level: Needs assistance Equipment used: 2 person hand held assist Transfers: Sit to/from Stand Sit to Stand: Mod assist, +2 physical assistance           General transfer comment: x2 attempts, cues for upright posture and to assist with weightshifting R/L sides     Balance Overall balance assessment: Needs assistance Sitting-balance support: Single extremity supported, Feet supported Sitting balance-Leahy Scale: Poor Sitting balance - Comments: cannot reach outside BOS Postural control: Right lateral lean Standing balance support: Single extremity supported Standing balance-Leahy Scale: Poor Standing balance comment: reliant on external support                           ADL either performed or assessed with clinical judgement   ADL Overall ADL's : Needs assistance/impaired Eating/Feeding: Minimal assistance Eating/Feeding Details (indicate cue type and reason): requires cups items stable, can use LUE to self feed Grooming: Minimal assistance Grooming Details (indicate cue type and reason): rubbing lotion on RUE with LUE Upper Body Bathing: Moderate assistance;Sitting Upper Body Bathing Details (indicate cue type and reason): simulated, use of LUE                         Functional mobility during ADLs: Maximal  assistance;+2 for physical assistance;+2 for safety/equipment      Extremity/Trunk Assessment Upper Extremity Assessment Upper Extremity Assessment: RUE deficits/detail RUE Deficits / Details: flaccid RUE Sensation: decreased light touch;decreased  proprioception RUE Coordination: decreased fine motor;decreased gross motor   Lower Extremity Assessment Lower Extremity Assessment: Defer to PT evaluation        Vision   Vision Assessment?: Vision impaired- to be further tested in functional context Additional Comments: R inattention, will further assess   Perception Perception Perception: Not tested   Praxis Praxis Praxis: Not tested    Cognition Arousal/Alertness: Awake/alert Behavior During Therapy: Flat affect Overall Cognitive Status: Impaired/Different from baseline Area of Impairment: Attention, Awareness                   Current Attention Level: Sustained Memory: Decreased short-term memory Following Commands: Follows one step commands with increased time Safety/Judgement: Decreased awareness of safety, Decreased awareness of deficits (R inattention)   Problem Solving: Slow processing, Difficulty sequencing, Decreased initiation          Exercises General Exercises - Upper Extremity Shoulder Flexion: PROM, Right, 10 reps Shoulder Extension: PROM, Right, 10 reps Elbow Flexion: PROM, Right, 15 reps Elbow Extension: PROM, Right, 15 reps Wrist Flexion: PROM, Right, 10 reps Wrist Extension: PROM, Right, 10 reps Digit Composite Flexion: PROM, Right, 10 reps Composite Extension: PROM, Right, 10 reps    Shoulder Instructions       General Comments VSS on RA, wife present at beginning of sesssion    Pertinent Vitals/ Pain       Pain Assessment Pain Assessment: No/denies pain  Home Living                                          Prior Functioning/Environment              Frequency  Min 2X/week        Progress Toward Goals  OT Goals(current goals can now be found in the care plan section)  Progress towards OT goals: Progressing toward goals  Acute Rehab OT Goals Patient Stated Goal: none stated OT Goal Formulation: With patient Time For Goal Achievement:  01/05/22 Potential to Achieve Goals: Good ADL Goals Pt Will Perform Grooming: with min assist;sitting Pt Will Perform Upper Body Dressing: with min assist;sitting Pt Will Perform Lower Body Dressing: with mod assist;sit to/from stand Pt Will Transfer to Toilet: with mod assist;stand pivot transfer  Plan Discharge plan remains appropriate;Frequency remains appropriate    Co-evaluation                 AM-PAC OT "6 Clicks" Daily Activity     Outcome Measure   Help from another person eating meals?: A Lot Help from another person taking care of personal grooming?: A Lot Help from another person toileting, which includes using toliet, bedpan, or urinal?: Total Help from another person bathing (including washing, rinsing, drying)?: A Lot Help from another person to put on and taking off regular upper body clothing?: A Lot Help from another person to put on and taking off regular lower body clothing?: Total 6 Click Score: 10    End of Session Equipment Utilized During Treatment: Gait belt  OT Visit Diagnosis: Unsteadiness on feet (R26.81);Other abnormalities of gait and mobility (R26.89);Muscle weakness (generalized) (M62.81);Hemiplegia and hemiparesis Hemiplegia - Right/Left: Right Hemiplegia - dominant/non-dominant: Dominant Hemiplegia - caused by: Nontraumatic intracerebral  hemorrhage   Activity Tolerance Patient tolerated treatment well   Patient Left in chair;with call bell/phone within reach;with chair alarm set   Nurse Communication Mobility status        Time: 7673-4193 OT Time Calculation (min): 29 min  Charges: OT General Charges $OT Visit: 1 Visit OT Treatments $Self Care/Home Management : 8-22 mins $Therapeutic Activity: 8-22 mins  Alfonzo Beers, OTD, OTR/L Acute Rehab [phone]) [phone]   Mayer Masker 12/28/2021, 2:19 PM

## 2021-12-28 NOTE — Progress Notes (Signed)
Speech Language Pathology Treatment: Dysphagia  Patient Details Name: Ronnie Jones MRN: [id number] DOB: November 16, 1950 Today's Date: 12/28/2021 Time: [id number] SLP Time Calculation (min) (ACUTE ONLY): 14 min  Assessment / Plan / Recommendation Clinical Impression  Pt reports feeling "better" today when asked about his speech and swallowing, although he also has trouble with recall of MBS. He says he remembers the study, but when asked what specifically he remembers, he says, "nothing." SLP reviewed results and reinforced rationale for current diet. Pt does seem to have improved oral clearance with purees and honey thick liquids compared to MBS, but there is some delayed throat clearing noted after PO trials were consumed. Would leave on current diet for now, with consideration of repeat MBS prior to diet advancements given silent aspiration noted on initial study.    HPI HPI: [name] is a 71 year old gentleman who presented via EMS as a Code Stroke after he was found at home with right sided weakness, sitting in his chair, by his wife. She had gone out to the store and returned to find him with right facial droop, right arm and leg weakness, unable to rise from chair. Head CT 5/15: "1. 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  2. 3 mm left-to-right midline shift, with slight increased  intraventricular hemorrhagic content than previously."  Pt with a past medical history of ETOH use (at least 1 pint per day), COPD, HTN, tobacco use (2-3 packs per day), and vitamin D deficiency; BSE completed and noted mild oral dysphagia with a diet of D3/thin recommended.  Pt with requirement of Bipap overnight d/t increased WOB.  CXR on 12/23/21 negative for acute changes.      SLP Plan  Continue with current plan of care      Recommendations for follow up therapy are one component of a multi-disciplinary discharge  planning process, led by the attending physician.  Recommendations may be updated based on patient status, additional functional criteria and insurance authorization.    Recommendations  Diet recommendations: Dysphagia 1 (puree);Honey-thick liquid Liquids provided via: Cup Medication Administration: Crushed with puree Supervision: Full supervision/cueing for compensatory strategies;Staff to assist with self feeding Compensations: Slow rate;Small sips/bites;Minimize environmental distractions;Monitor for anterior loss;Lingual sweep for clearance of pocketing Postural Changes and/or Swallow Maneuvers: Seated upright 90 degrees                Oral Care Recommendations: Oral care BID;Staff/trained caregiver to provide oral care Follow Up Recommendations: Acute inpatient rehab (3hours/day) Assistance recommended at discharge: Frequent or constant Supervision/Assistance SLP Visit Diagnosis: Dysphagia, oropharyngeal phase (R13.12) Plan: Continue with current plan of care           Osie Bond., M.A. [place] Office [phone]  Secure chat preferred   12/28/2021, 4:50 PM

## 2021-12-28 NOTE — Progress Notes (Addendum)
STROKE TEAM PROGRESS NOTE   INTERVAL HISTORY Patient is seen in his room with his wife at the bedside. He has been hemodynamically stable and has had no acute events overnight.  Neurological exam is stable.  Will likely transfer to CIR after insurance approval soon.  He appears medically stable for transfer to rehab  Vitals:   12/28/21 0010 12/28/21 0205 12/28/21 0451 12/28/21 0845  BP: (!) 189/94 (!) 147/79 (!) 157/84 123/64  Pulse: 89  79 72  Resp: 19  19 18   Temp: (!) 97.4 F (36.3 C)  98.7 F (37.1 C) 98.4 F (36.9 C)  TempSrc: Oral   Oral  SpO2: 97%  97% 96%  Weight:      Height:       CBC:  Recent Labs  Lab 12/21/21 1737 12/21/21 1745 12/26/21 0232 12/28/21 0111  WBC 6.8   < > 9.3 7.4  NEUTROABS 4.6  --   --   --   HGB 14.8   < > 14.5 14.1  HCT 43.9   < > 44.1 42.1  MCV 89.0   < > 90.6 90.0  PLT 286   < > 278 211   < > = values in this interval not displayed.    Basic Metabolic Panel:  Recent Labs  Lab 12/27/21 0038 12/28/21 0111  NA 138 138  K 4.3 4.0  CL 111 111  CO2 16* 16*  GLUCOSE 136* 128*  BUN 35* 35*  CREATININE 1.68* 1.65*  CALCIUM 8.5* 8.3*    Lipid Panel:  Recent Labs  Lab 12/21/21 1737 12/23/21 0607 12/25/21 0555  CHOL 321*  --   --   TRIG 339*   < > 213*  HDL 61  --   --   CHOLHDL 5.3  --   --   VLDL 68*  --   --   LDLCALC 192*  --   --    < > = values in this interval not displayed.    HgbA1c:  Recent Labs  Lab 12/21/21 1737  HGBA1C 6.1*    Urine Drug Screen:  Recent Labs  Lab 12/22/21 0757  LABOPIA NONE DETECTED  COCAINSCRNUR NONE DETECTED  LABBENZ NONE DETECTED  AMPHETMU NONE DETECTED  THCU NONE DETECTED  LABBARB NONE DETECTED     Alcohol Level  Recent Labs  Lab 12/21/21 2032  ETH <10     IMAGING past 24 hours MR ANGIO HEAD WO CONTRAST  Result Date: 12/27/2021 CLINICAL DATA:  Stroke, follow up; Stroke/TIA, determine embolic source EXAM: MRI HEAD WITHOUT AND WITH CONTRAST MRA HEAD WITHOUT CONTRAST  TECHNIQUE: Multiplanar, multi-echo pulse sequences of the brain and surrounding structures were acquired without and with intravenous contrast. Angiographic images of the Circle of Willis were acquired using MRA technique without intravenous contrast. CONTRAST:  7.38mL GADAVIST GADOBUTROL 1 MMOL/ML IV SOLN COMPARISON:  None Available. FINDINGS: MRI HEAD FINDINGS Brain: Acute and early subacute hemorrhage centered within the left thalamus and involving the adjacent white matter including the posterior limb of the internal capsule. Surrounding edema is present with mass effect on the adjacent left lateral ventricle. Intraventricular extension is noted with hemorrhage layering within the occipital horns. Evaluation for enhancement is somewhat limited by intrinsic T1 shortening, but there is no evidence of an underlying lesion. No evidence of prior hemorrhage remote from this site. Patchy and confluent areas of T2 hyperintensity in the supratentorial and pontine white matter are nonspecific but may reflect mild to moderate chronic microvascular ischemic changes. Prominence  of the ventricles and sulci reflects parenchymal volume loss. No hydrocephalus. Vascular: Major vessel flow voids at the skull base are preserved. Skull and upper cervical spine: Normal marrow signal. Sinuses/Orbits: Minor paranasal sinus mucosal thickening. Orbits are unremarkable. Other: Patchy bilateral mastoid fluid opacification. Sella is unremarkable. MRA HEAD FINDINGS Anterior circulation: Intracranial internal carotid arteries are patent with atherosclerotic irregularity. Anterior and middle cerebral arteries are patent. Posterior circulation: Included intracranial vertebral arteries are patent. Basilar artery is patent. Posterior cerebral arteries are patent. There is fetal origin of the right PCA. IMPRESSION: Hemorrhage centered within the left thalamus and adjacent white matter as seen on recent prior CT imaging. Surrounding edema with mass  effect and intraventricular extension are similar. No evidence of an underlying lesion. Mild intracranial atherosclerosis. Vascular imaging is otherwise unremarkable. Electronically Signed   By: Guadlupe Spanish M.D.   On: 12/27/2021 19:26   MR BRAIN W WO CONTRAST  Result Date: 12/27/2021 CLINICAL DATA:  Stroke, follow up; Stroke/TIA, determine embolic source EXAM: MRI HEAD WITHOUT AND WITH CONTRAST MRA HEAD WITHOUT CONTRAST TECHNIQUE: Multiplanar, multi-echo pulse sequences of the brain and surrounding structures were acquired without and with intravenous contrast. Angiographic images of the Circle of Willis were acquired using MRA technique without intravenous contrast. CONTRAST:  7.26mL GADAVIST GADOBUTROL 1 MMOL/ML IV SOLN COMPARISON:  None Available. FINDINGS: MRI HEAD FINDINGS Brain: Acute and early subacute hemorrhage centered within the left thalamus and involving the adjacent white matter including the posterior limb of the internal capsule. Surrounding edema is present with mass effect on the adjacent left lateral ventricle. Intraventricular extension is noted with hemorrhage layering within the occipital horns. Evaluation for enhancement is somewhat limited by intrinsic T1 shortening, but there is no evidence of an underlying lesion. No evidence of prior hemorrhage remote from this site. Patchy and confluent areas of T2 hyperintensity in the supratentorial and pontine white matter are nonspecific but may reflect mild to moderate chronic microvascular ischemic changes. Prominence of the ventricles and sulci reflects parenchymal volume loss. No hydrocephalus. Vascular: Major vessel flow voids at the skull base are preserved. Skull and upper cervical spine: Normal marrow signal. Sinuses/Orbits: Minor paranasal sinus mucosal thickening. Orbits are unremarkable. Other: Patchy bilateral mastoid fluid opacification. Sella is unremarkable. MRA HEAD FINDINGS Anterior circulation: Intracranial internal carotid  arteries are patent with atherosclerotic irregularity. Anterior and middle cerebral arteries are patent. Posterior circulation: Included intracranial vertebral arteries are patent. Basilar artery is patent. Posterior cerebral arteries are patent. There is fetal origin of the right PCA. IMPRESSION: Hemorrhage centered within the left thalamus and adjacent white matter as seen on recent prior CT imaging. Surrounding edema with mass effect and intraventricular extension are similar. No evidence of an underlying lesion. Mild intracranial atherosclerosis. Vascular imaging is otherwise unremarkable. Electronically Signed   By: Guadlupe Spanish M.D.   On: 12/27/2021 19:26    PHYSICAL EXAM  Physical Exam  Constitutional: Appears well-developed and well-nourished.  Cardiovascular: Normal rate and regular rhythm.  Respiratory: Unlabored respirations on supplemental O2 via [place]  Neuro: Mental Status: Patient is alert and oriented x2 Cranial Nerves: II: Visual Fields are full. Pupils are equal, round, and reactive to light.   III,IV, VI: EOMI without ptosis or diplopia, some saccadic dysmetria noted V: Facial sensation symmetrical VII: Smile is symmetrical VIII: Hearing is intact to voice Motor: Tone is normal. Bulk is normal.  Left hemibody 5/5, follows commands Right UE: 0/5 Right LE: 0/5, follows commands Sensory: Sensation is symmetric to light touch in the  arms and legs.  Cerebellar: FTN intact on left    ASSESSMENT/PLAN [name] is a 71 y.o. male with history of ETOH use (at least 1 pint per day), COPD, HTN, tobacco use (2-3 packs per day), and vitamin D deficiency presenting with right sided weakness, right facial droop. CIWA protocol initiated with ativan, last drink 5/14 evening. MRI pending. CTA shows severe stenosis. PT, OT pending. MRI pending. Dysphgia 3 diet ordered per SLP.  5/19:Transferred out of the ICU to the floor bed. Now awaiting CIR transfer  Stroke:  Left  subcortical IPH involving the left thalamus and medial basal ganglia with IVH, likely due to hypertensive emergency Code Stroke CT head -Acute intraparenchymal hemorrhage centered in the left thalamus and medial basal ganglia, with intraventricular extension, primarily into the left lateral ventricle. Mild surrounding edema and local mass effect without significant midline shift. 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 Bilateral carotid bifurcation atherosclerosis without greater than 50% stenosis. Follow up CT 5/17 Unchanged size of left thalamic IPH MRI not able to perform due to respiratory distress 2D Echo EF 65-70% LDL 192 HgbA1c 6.1 VTE prophylaxis - SCDs Reportedly taking 812mg  of ASA daily for pain prior to admission, now on No antithrombotic due to ICH Therapy recommendations: CIR Disposition:  Pending   Hypertensive emergency-resolved. Home meds:  Lisinopril 20mg , amlodipine 10mg , atenolol 50mg  IV cleviprex -> changed to nicardipine due to elevated triglycerides Resume home amlodipine, atenolol  onhydralazine 50->100 every 8h  Lisinopril stopped due to AKI Long-term BP goal normotensive BP goal less than 160  Hyperlipidemia Home meds:  Crestor 20mg  (not taking) LDL 192, goal < 70 Consider Crestor 40 at discharge  Diabetes type II Controlled Home meds:  Metformin HgbA1c 6.1, goal < 7.0 CBGs SSI Close PCP follow-up  Dysphagia SLP following Dysphagia 3 diet, thin liquid  BP meds resumed MBS today with speech therapy  Tobacco abuse Current smoker, smoking 2-3ppd Smoking cessation counseling provided Nicotine patch provided Pt is willing to quit  Alcohol abuse Heavy drinker, currently drinking 1 pint of tequila per day CIWA protocol initiated with ativan B1/FA/MVI  AKI on LR IVFs. Hyperkalemia Cr 1.20->2.22->2.39->2.28 -> 2.38-> 1.65 K+ 5.3->5.0->3.9-> 4.0-> stable   Other Stroke Risk Factors DVT prophy:  heparin 5,000 units q 8hrs.  Other Active Problems Acetylsalicylic Acid Overuse Reportedly taking 812mg  of ASA daily (2 325mg  and 2 81mg  pills)  Hospital day # 7  Cortney E Ernestina Columbiae La Torre , MSN, AGACNP-BC [facility] See Amion for schedule and pager information 12/28/2021 11:32 AM  I have personally obtained history,examined this patient, reviewed notes, independently viewed imaging studies, participated in medical decision making and plan of care.ROS completed by me personally and pertinent positives fully documented  I have made any additions or clarifications directly to the above note. Agree with note above.  Continue ongoing therapies and transfer to inpatient rehab.  Long discussion with patient and wife at the bedside and answered questions.  Greater than 50% time spent in counseling and coordination of care as i well as discussion about plans for rehabilitation and answering questions. Delia HeadyPramod Ethel Veronica, MD Medical Director [facility] Pager: [phone] 12/28/2021 1:56 PM

## 2021-12-28 NOTE — Progress Notes (Signed)
Inpatient Rehabilitation Admissions Coordinator   I will begin Auth with [employer] for possible Cir admit.  Ottie Glazier, RN, MSN Rehab Admissions Coordinator [phone] 12/28/2021 2:25 PM

## 2021-12-28 NOTE — Progress Notes (Signed)
Physical Therapy Treatment Patient Details Name: Ronnie Jones MRN: [id number] DOB: 1951-04-21 Today's Date: 12/28/2021   History of Present Illness Mr. Snee is a 71 year old gentleman who presented via EMS as a Code Stroke after he was found at home with right sided weakness, sitting in his chair, by his wife. CT showed L subcortical ICH. PMH:  ETOH use (at least 1 pint per day), COPD, HTN, tobacco use (2-3 packs per day), and vitamin D deficiency.    PT Comments    Pt with improved ability to actively participate. Worked on sit to stands in the stedy with focus on increasing R LE WBing and stimulation of R glut and quad. Pt beginning to actively initiate contraction in R LE and UE. Pt remains to required mod/maxA for transfers but is motivated and progressing well towards all goals. Pt to continue to benefit from AIR upon d/c for maximal functional recovery for safe transition home with spouse.     Recommendations for follow up therapy are one component of a multi-disciplinary discharge planning process, led by the attending physician.  Recommendations may be updated based on patient status, additional functional criteria and insurance authorization.  Follow Up Recommendations  Acute inpatient rehab (3hours/day)     Assistance Recommended at Discharge Frequent or constant Supervision/Assistance  Patient can return home with the following Two people to help with walking and/or transfers;Two people to help with bathing/dressing/bathroom;Assistance with feeding;Assistance with cooking/housework;Direct supervision/assist for medications management;Direct supervision/assist for financial management;Assist for transportation;Help with stairs or ramp for entrance   Equipment Recommendations  Wheelchair (measurements PT);Wheelchair cushion (measurements PT);BSC/3in1;Other (comment) (hemiwalker)    Recommendations for Other Services Rehab consult     Precautions / Restrictions  Precautions Precautions: Fall Precaution Comments: R hemiparesis Restrictions Weight Bearing Restrictions: No     Mobility  Bed Mobility Overal bed mobility: Needs Assistance Bed Mobility: Rolling, Sidelying to Sit Rolling: Mod assist Sidelying to sit: Mod assist, HOB elevated       General bed mobility comments: rolled to the R, once initiated by tactile cues pt pulled self over with L UE on R bed rail, maxA for trunk elevation due to inability to push up from R elbow    Transfers Overall transfer level: Needs assistance Equipment used: Ambulation equipment used Transfers: Sit to/from Stand Sit to Stand: Mod assist, +2 physical assistance           General transfer comment: pulled self up in the stedy with modAx2 to manage R LE and to maintain midline posture    Ambulation/Gait                   Stairs             Wheelchair Mobility    Modified Rankin (Stroke Patients Only) Modified Rankin (Stroke Patients Only) Pre-Morbid Rankin Score: No symptoms Modified Rankin: Severe disability     Balance Overall balance assessment: Needs assistance Sitting-balance support: Single extremity supported, Feet supported Sitting balance-Leahy Scale: Poor Sitting balance - Comments: minA, less significant R lateral lean, cues needed for upright posture Postural control: Right lateral lean Standing balance support: Single extremity supported Standing balance-Leahy Scale: Poor Standing balance comment: dependent on external support. Worked on standing in stedy, wife held R hand on bar, tech assist pt with maintain midline posture due to significant R lateral bias and PT worked on manual stimulation to R posterior hip and knee to engage muscle contraction, 2 bouts, 1 for 90 sec, 1 for 30  sec, limited by fatigue                            Cognition Arousal/Alertness: Awake/alert Behavior During Therapy: Flat affect Overall Cognitive Status:  Impaired/Different from baseline Area of Impairment: Attention, Awareness                 Orientation Level: Disoriented to, Situation, Time, Place Current Attention Level: Sustained Memory: Decreased short-term memory Following Commands: Follows one step commands with increased time Safety/Judgement: Decreased awareness of safety, Decreased awareness of deficits (R inattention) Awareness: Intellectual Problem Solving: Slow processing, Difficulty sequencing, Decreased initiation General Comments: R inattention but better than previous session, better command follow this date, delayed processing/sequencing with standing requiring verbal/tactile cues to initiate        Exercises General Exercises - Lower Extremity Ankle Circles/Pumps: 5 reps, AAROM, Right, Supine (encourage attempts with RLE) Quad Sets: Right, AAROM, 10 reps, Supine Gluteal Sets: AROM, Both, 10 reps, Standing Long Arc Quad: AAROM, Right, Seated    General Comments General comments (skin integrity, edema, etc.): VSS      Pertinent Vitals/Pain Pain Assessment Pain Assessment: No/denies pain Pain Location: everywhere (pt reports chronic pain) Pain Descriptors / Indicators: Aching    Home Living                          Prior Function            PT Goals (current goals can now be found in the care plan section) Acute Rehab PT Goals Patient Stated Goal: return home PT Goal Formulation: With patient Time For Goal Achievement: 01/05/22 Potential to Achieve Goals: Good Progress towards PT goals: Progressing toward goals    Frequency    Min 4X/week      PT Plan Current plan remains appropriate    Co-evaluation              AM-PAC PT "6 Clicks" Mobility   Outcome Measure  Help needed turning from your back to your side while in a flat bed without using bedrails?: A Lot Help needed moving from lying on your back to sitting on the side of a flat bed without using bedrails?: A  Lot Help needed moving to and from a bed to a chair (including a wheelchair)?: A Lot Help needed standing up from a chair using your arms (e.g., wheelchair or bedside chair)?: A Lot Help needed to walk in hospital room?: Total Help needed climbing 3-5 steps with a railing? : Total 6 Click Score: 10    End of Session Equipment Utilized During Treatment: Gait belt Activity Tolerance: Patient tolerated treatment well Patient left: in chair;with chair alarm set;with family/visitor present;with call bell/phone within reach (bed in chair posture) Nurse Communication: Mobility status PT Visit Diagnosis: Unsteadiness on feet (R26.81);Hemiplegia and hemiparesis;Difficulty in walking, not elsewhere classified (R26.2) Hemiplegia - Right/Left: Right Hemiplegia - dominant/non-dominant: Dominant Hemiplegia - caused by: Nontraumatic SAH     Time: 1201-1226 PT Time Calculation (min) (ACUTE ONLY): 25 min  Charges:  $Therapeutic Exercise: 8-22 mins $Neuromuscular Re-education: 8-22 mins                     Lewis Shock, PT, DPT Acute Rehabilitation Services Secure chat preferred Office #: [phone]    Iona Hansen 12/28/2021, 1:51 PM

## 2021-12-29 DIAGNOSIS — I619 Nontraumatic intracerebral hemorrhage, unspecified: Secondary | ICD-10-CM | POA: Diagnosis not present

## 2021-12-29 LAB — BLOOD GAS, VENOUS
Acid-base deficit: 2.4 mmol/L — ABNORMAL HIGH (ref 0.0–2.0)
Bicarbonate: 21.6 mmol/L (ref 20.0–28.0)
Drawn by: 426281
O2 Saturation: 90.8 %
Patient temperature: 36.5
pCO2, Ven: 34 mmHg — ABNORMAL LOW (ref 44–60)
pH, Ven: 7.41 (ref 7.25–7.43)
pO2, Ven: 61 mmHg — ABNORMAL HIGH (ref 32–45)

## 2021-12-29 LAB — BASIC METABOLIC PANEL
Anion gap: 9 (ref 5–15)
BUN: 42 mg/dL — ABNORMAL HIGH (ref 8–23)
CO2: 19 mmol/L — ABNORMAL LOW (ref 22–32)
Calcium: 8.7 mg/dL — ABNORMAL LOW (ref 8.9–10.3)
Chloride: 110 mmol/L (ref 98–111)
Creatinine, Ser: 1.58 mg/dL — ABNORMAL HIGH (ref 0.61–1.24)
GFR, Estimated: 47 mL/min — ABNORMAL LOW (ref >60)
Glucose, Bld: 103 mg/dL — ABNORMAL HIGH (ref 70–99)
Potassium: 4.1 mmol/L (ref 3.5–5.1)
Sodium: 138 mmol/L (ref 135–145)

## 2021-12-29 LAB — GLUCOSE, CAPILLARY
Glucose-Capillary: 105 mg/dL — ABNORMAL HIGH (ref 70–99)
Glucose-Capillary: 145 mg/dL — ABNORMAL HIGH (ref 70–99)
Glucose-Capillary: 246 mg/dL — ABNORMAL HIGH (ref 70–99)
Glucose-Capillary: 69 mg/dL — ABNORMAL LOW (ref 70–99)
Glucose-Capillary: 97 mg/dL (ref 70–99)

## 2021-12-29 MED ORDER — INSULIN ASPART 100 UNIT/ML IJ SOLN: 0.0000 [IU] | Freq: Three times a day (TID) | INTRAMUSCULAR | Status: DC

## 2021-12-29 MED ORDER — INSULIN ASPART 100 UNIT/ML IJ SOLN
0.0000 [IU] | Freq: Three times a day (TID) | INTRAMUSCULAR | Status: DC
  Administered 2021-12-29 – 2021-12-30 (×2): 5 [IU] via SUBCUTANEOUS
  Administered 2021-12-30: 2 [IU] via SUBCUTANEOUS
  Administered 2021-12-31: 5 [IU] via SUBCUTANEOUS

## 2021-12-29 MED ORDER — PREDNISONE 5 MG PO TABS
30.0000 mg | ORAL_TABLET | Freq: Every day | ORAL | Status: DC
  Administered 2021-12-30 – 2021-12-31 (×2): 30 mg via ORAL
  Filled 2021-12-29 (×2): qty 2

## 2021-12-29 MED ORDER — INSULIN ASPART 100 UNIT/ML IJ SOLN: 0.0000 [IU] | Freq: Every day | INTRAMUSCULAR | Status: DC

## 2021-12-29 NOTE — Progress Notes (Signed)
Pt CBG @2152  was 69. Pt was given thickened orange juice with 2 packs of sugar added. Will re-check.

## 2021-12-29 NOTE — Progress Notes (Addendum)
STROKE TEAM PROGRESS NOTE   INTERVAL HISTORY Patient is seen in his room with his wife at the bedside. He has been hemodynamically stable and has had no acute events overnight.   .  Awaiting insurance approval for CIR. vital signs are stable.  Neurological exam is unchanged with dense right hemiplegia persisting  Vitals:   12/28/21 2332 12/29/21 0504 12/29/21 0725 12/29/21 1136  BP: (!) 147/76 140/69 (!) 156/83 (!) 147/76  Pulse: 69 60 67 78  Resp: 17 18 18 18   Temp: 98 F (36.7 C) 97.7 F (36.5 C) (!) 97.5 F (36.4 C) 97.6 F (36.4 C)  TempSrc: Oral Oral Oral Oral  SpO2: 96% 96% 96% 97%  Weight:      Height:       CBC:  Recent Labs  Lab 12/26/21 0232 12/28/21 0111  WBC 9.3 7.4  HGB 14.5 14.1  HCT 44.1 42.1  MCV 90.6 90.0  PLT 278 211    Basic Metabolic Panel:  Recent Labs  Lab 12/28/21 0111 12/29/21 0429  NA 138 138  K 4.0 4.1  CL 111 110  CO2 16* 19*  GLUCOSE 128* 103*  BUN 35* 42*  CREATININE 1.65* 1.58*  CALCIUM 8.3* 8.7*    Lipid Panel:  Recent Labs  Lab 12/25/21 0555  TRIG 213*    HgbA1c:  No results for input(s): HGBA1C in the last 168 hours.  Urine Drug Screen: No results for input(s): LABOPIA, COCAINSCRNUR, LABBENZ, AMPHETMU, THCU, LABBARB in the last 168 hours.   Alcohol Level  No results for input(s): ETH in the last 168 hours.   IMAGING past 24 hours No results found.  PHYSICAL EXAM  Physical Exam  Constitutional: Appears well-developed and well-nourished middle-age  male.  Cardiovascular: Normal rate and regular rhythm.  Respiratory: Unlabored respirations on supplemental O2 via [place]  Neuro: Mental Status: Patient is alert and oriented x2 Cranial Nerves: II: Visual Fields are full. Pupils are equal, round, and reactive to light.   III,IV, VI: EOMI without ptosis or diplopia, some saccadic dysmetria noted V: Facial sensation symmetrical VII: Smile is symmetrical VIII: Hearing is intact to voice Motor: Tone is normal. Bulk is  normal.  Left hemibody 5/5, follows commands Right UE: 0/5 Right LE: 0/5 Sensory: Sensation is symmetric to light touch in the arms and legs.  Cerebellar: FTN intact on left    ASSESSMENT/PLAN [name] is a 71 y.o. male with history of ETOH use (at least 1 pint per day), COPD, HTN, tobacco use (2-3 packs per day), and vitamin D deficiency presenting with right sided weakness, right facial droop. CIWA protocol initiated with ativan, last drink 5/14 evening. MRI pending. CTA shows severe stenosis. PT, OT pending. MRI pending. Dysphgia 3 diet ordered per SLP.  5/19:Transferred out of the ICU to the floor bed. Now awaiting CIR transfer  Stroke:  Left subcortical IPH involving the left thalamus and medial basal ganglia with IVH, likely due to hypertensive emergency Code Stroke CT head -Acute intraparenchymal hemorrhage centered in the left thalamus and medial basal ganglia, with intraventricular extension, primarily into the left lateral ventricle. Mild surrounding edema and local mass effect without significant midline shift. 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 Bilateral carotid bifurcation atherosclerosis without greater than 50% stenosis. Follow up CT 5/17 Unchanged size of left thalamic IPH MRI not able to perform due to respiratory distress 2D Echo EF 65-70% LDL 192 HgbA1c 6.1 VTE prophylaxis - SCDs Reportedly taking  812mg  of ASA daily for pain prior to admission, now on No antithrombotic due to ICH Therapy recommendations: CIR Disposition:  Pending   Hypertensive emergency-resolved. Home meds:  Lisinopril 20mg , amlodipine 10mg , atenolol 50mg  IV cleviprex -> changed to nicardipine due to elevated triglycerides Resume home amlodipine, atenolol  onhydralazine 50->100 every 8h  Lisinopril stopped due to AKI Long-term BP goal normotensive BP goal less than 160  Hyperlipidemia Home meds:  Crestor 20mg  (not taking) LDL 192,  goal < 70 Consider Crestor 40 at discharge  Diabetes type II Controlled Home meds:  Metformin HgbA1c 6.1, goal < 7.0 CBGs SSI Close PCP follow-up  Dysphagia SLP following Dysphagia 3 diet, thin liquid  BP meds resumed MBS today with speech therapy  Tobacco abuse Current smoker, smoking 2-3ppd Smoking cessation counseling provided Nicotine patch provided Pt is willing to quit  Alcohol abuse Heavy drinker, currently drinking 1 pint of tequila per day CIWA protocol initiated with ativan B1/FA/MVI  AKI on LR IVFs. Hyperkalemia Cr 1.20->2.22->2.39->2.28 -> 2.38-> 1.65-> 1.58 K+ 5.3->5.0->3.9-> 4.0-> stable   Other Stroke Risk Factors DVT prophy: heparin 5,000 units q 8hrs.  Other Active Problems Acetylsalicylic Acid Overuse Reportedly taking 812mg  of ASA daily (2 325mg  and 2 81mg  pills)  Hospital day # 8  Ronnie Jones , MSN, AGACNP-BC [facility] See Amion for schedule and pager information 12/29/2021 12:44 PM I have personally obtained history,examined this patient, reviewed notes, independently viewed imaging studies, participated in medical decision making and plan of care.ROS completed by me personally and pertinent positives fully documented  I have made any additions or clarifications directly to the above note. Agree with note above.  Continue ongoing physical occupational therapy and patient medically stable to be transferred to inpatient rehab when bed available.  Long discussion with patient and wife at the bedside and answered questions.  Discussed with Dr. .  Stroke team will sign off.  Can call for questions.  Greater than 50% time during this 30-minute visit was spent on counseling and coordination of care and discussion with patient and care team and answering questions.  , MD Medical Director [facility] Pager: [phone] 12/29/2021 1:13 PM

## 2021-12-29 NOTE — Progress Notes (Signed)
Pt CBG upon re-check at 2214 was 97. Will continue to monitor pt.

## 2021-12-29 NOTE — Progress Notes (Signed)
Physical Therapy Treatment Patient Details Name: Ronnie Jones MRN: [id number] DOB: 08/28/1950 Today's Date: 12/29/2021   History of Present Illness Ronnie Jones is a 71 year old gentleman who presented via EMS as a Code Stroke after he was found at home with right sided weakness, sitting in his chair, by his wife. CT showed L subcortical ICH. PMH:  ETOH use (at least 1 pint per day), COPD, HTN, tobacco use (2-3 packs per day), and vitamin D deficiency.    PT Comments    Pt more conversant today/attempting to verbalize. Focused on maintaining midline posture sitting EOB balance and standing balance in steady. Pt with strong R lateral lean in standing. Pt can correct with max verbal and tactile cues but unable to maintain >2 seconds. Pt remains to have dense R UE hemiparesis with edema. Acute PT to cont to follow. Continue to recommend AIR upon d/c to maximize functional recovery.   Recommendations for follow up therapy are one component of a multi-disciplinary discharge planning process, led by the attending physician.  Recommendations may be updated based on patient status, additional functional criteria and insurance authorization.  Follow Up Recommendations  Acute inpatient rehab (3hours/day)     Assistance Recommended at Discharge Frequent or constant Supervision/Assistance  Patient can return home with the following Two people to help with walking and/or transfers;Two people to help with bathing/dressing/bathroom;Assistance with feeding;Assistance with cooking/housework;Direct supervision/assist for medications management;Direct supervision/assist for financial management;Assist for transportation;Help with stairs or ramp for entrance   Equipment Recommendations  Wheelchair (measurements PT);Wheelchair cushion (measurements PT);BSC/3in1;Other (comment) (hemiwalker)    Recommendations for Other Services Rehab consult     Precautions / Restrictions Precautions Precautions:  Fall Precaution Comments: R hemiparesis Restrictions Weight Bearing Restrictions: No     Mobility  Bed Mobility Overal bed mobility: Needs Assistance Bed Mobility: Rolling, Sidelying to Sit Rolling: Min assist Sidelying to sit: Mod assist, HOB elevated       General bed mobility comments: directional verbal cues, assist for R LE management off EOB, modA for trunk elevation, pt with solid effort in trying to pull self up with L UE    Transfers Overall transfer level: Needs assistance Equipment used:  (stedy) Transfers: Sit to/from Stand Sit to Stand: Mod assist, +2 physical assistance           General transfer comment: worked on sit to stand x 5, wife to hold R hand onto stedy due to inability to grip with R hand, modA and max verbal cues to extend R knee, tactile cues at posterior hip to promote R hip extension, pt with quick onset of fatigue, shorter endurance as attempts progressed    Ambulation/Gait                   Stairs             Wheelchair Mobility    Modified Rankin (Stroke Patients Only) Modified Rankin (Stroke Patients Only) Pre-Morbid Rankin Score: No symptoms Modified Rankin: Severe disability     Balance Overall balance assessment: Needs assistance Sitting-balance support: Single extremity supported, Feet supported Sitting balance-Leahy Scale: Poor Sitting balance - Comments: cannot reach outside BOS Postural control: Right lateral lean Standing balance support: Single extremity supported Standing balance-Leahy Scale: Poor Standing balance comment: reliant on external support                            Cognition Arousal/Alertness: Awake/alert Behavior During Therapy: Flat affect Overall Cognitive  Status: Impaired/Different from baseline Area of Impairment: Attention, Awareness                   Current Attention Level: Sustained Memory: Decreased short-term memory Following Commands: Follows one step  commands with increased time Safety/Judgement: Decreased awareness of safety, Decreased awareness of deficits (R inattention) Awareness: Intellectual Problem Solving: Slow processing, Difficulty sequencing, Decreased initiation General Comments: pt more verbal today. R inattention but better than previous session, better command follow this date, delayed processing/sequencing with standing requiring verbal/tactile cues to initiate        Exercises General Exercises - Lower Extremity Quad Sets: Right, 10 reps, Supine, AROM Gluteal Sets: AROM, Both, 10 reps, Standing Long Arc Quad: AAROM, Right, Seated Hip Flexion/Marching: AAROM, Left, 5 reps, Standing (in stedy)    General Comments General comments (skin integrity, edema, etc.): VSS on RA, wife present      Pertinent Vitals/Pain Pain Assessment Pain Assessment: No/denies pain    Home Living                          Prior Function            PT Goals (current goals can now be found in the care plan section) Acute Rehab PT Goals Patient Stated Goal: return home PT Goal Formulation: With patient Time For Goal Achievement: 01/05/22 Potential to Achieve Goals: Good Progress towards PT goals: Progressing toward goals    Frequency    Min 4X/week      PT Plan Current plan remains appropriate    Co-evaluation              AM-PAC PT "6 Clicks" Mobility   Outcome Measure  Help needed turning from your back to your side while in a flat bed without using bedrails?: A Lot Help needed moving from lying on your back to sitting on the side of a flat bed without using bedrails?: A Lot Help needed moving to and from a bed to a chair (including a wheelchair)?: A Lot Help needed standing up from a chair using your arms (e.g., wheelchair or bedside chair)?: A Lot Help needed to walk in hospital room?: Total Help needed climbing 3-5 steps with a railing? : Total 6 Click Score: 10    End of Session Equipment  Utilized During Treatment: Gait belt Activity Tolerance: Patient tolerated treatment well Patient left: in chair;with chair alarm set;with family/visitor present;with call bell/phone within reach (bed in chair posture) Nurse Communication: Mobility status PT Visit Diagnosis: Unsteadiness on feet (R26.81);Hemiplegia and hemiparesis;Difficulty in walking, not elsewhere classified (R26.2) Hemiplegia - Right/Left: Right Hemiplegia - dominant/non-dominant: Dominant Hemiplegia - caused by: Nontraumatic SAH     Time: 2505-3976 PT Time Calculation (min) (ACUTE ONLY): 24 min  Charges:  $Therapeutic Activity: 8-22 mins $Neuromuscular Re-education: 8-22 mins                     Lewis Shock, PT, DPT Acute Rehabilitation Services Secure chat preferred Office #: [phone]    Iona Hansen 12/29/2021, 12:55 PM

## 2021-12-29 NOTE — Progress Notes (Signed)
Progress Note   Patient: Ronnie Jones [id number] DOB: 09/30/1950 DOA: 12/21/2021     8 DOS: the patient was seen and examined on 12/29/2021        Brief hospital course: Ronnie Jones is a 71 y.o. M with DM, HTN, COPD FEV1 46% by PFT, 23% by spirometry in 2019, alcohol dependence, still smoking, lung nodules who presented with RIGHT hemiparesis.  Wife found him at home in a chair, right sided weakness, right facial droop.    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  Repeat MRI on 5/21 showed normal evolution, no worsening.   Echo without cardiogenic source.  Noninvasive angiography showed bilateral vertebral disease.  Carotid imaging normal.  - Aspirin held given intraparenchymal hemorrhage - Lipids ordered, LDL 192, continue Crestor 40 mg nightly - Continue blood pressure control, amlodipine, atenolol, hydralazine    -Atrial fibrillation: Not present on tele -Dysphagia screen ordered in ER -PT eval ordered: recommended patient rehab -Smoking cessation: Counseling given     Hypertensive emergency Blood pressures up to [id number] systolic and [id number] diastolic on admission with accompanying intraparenchymal hemorrhage.  Blood pressures now been controlled - Continue atenolol, amlodipine, hydralazine  COPD with acute exacerbation (HCC) This appears to be improving - Continue prednisone, day 6 of steroids, taper to 30  today will plan for fruther taper at discharge to CIR - Continue ICS/LABA - Continue DuoNeb - Continue PPI  Acute respiratory failure with hypoxia (HCC) At baseline does not use O2.  On 5/16 and 5/17 was persistently hypoxic to the 80s and had respiratory rate up to the 30s, placed on BiPAP.  Now resolved to room air>  COPD flare improving.  Aspiration pneumonia ([place]) Procal neg and CXR clear.  Pulm started antibiotics, he completed 5 days.   AKI (acute kidney injury) ([place]) CKD ruled out.  No clear prior chronic kidney disease.  Baseline 2019 0.6-0.7, here was 1.3 on admission, up to 2-2.3 over the last several days in setting of BP lowering  Urine with proteinuria, acellular.  His blood pressure was lowered and he had not some IV fluids his creatinine is down to 1.6 and stabilized there. - Follow-up creatinine as an outpatient  Vitamin C deficiency Vitamin C level low - Continue vitamin C - Repeat level in 6 months  Alcohol use disorder Developed complicated withdrawals, now completely resolved.  Vitamin D deficiency B12, zinc, vitamin A, and copper normal.   - Continue ergocalciferol weekly - Check level in 6 months   Solid nodule of lung 6 mm to 8 mm in diameter Previously known.   - Outpatient follow up  Tobacco abuse - Smoking cessation recommended   DM type 2 (diabetes mellitus, type 2) (HCC) Glucose up today - Continue SS corrections, increase dose today   Dysphagia Doing well  Normal anion gap metabolic acidosis Likely from AKI, improved today, PH normal.   DTs (delirium tremens) ([place]) This has resolved, treated with phenobarbital taper.   Acute metabolic encephalopathy At baseline he is independent, has no  cognitive impairment.  On presentation he was confused due to [place].            Subjective: No new complaints, breathing somewhat better.  Downhearted about the current situation still.     Physical Exam: Vitals:   12/29/21 0504  12/29/21 0725 12/29/21 1136 12/29/21 1514  BP: 140/69 (!) 156/83 (!) 147/76 124/64  Pulse: 60 67 78 73  Resp: 18 18 18 18   Temp: 97.7 F (36.5 C) (!) 97.5 F (36.4 C) 97.6 F (36.4 C) 97.6 F (36.4 C)  TempSrc: Oral Oral Oral Oral  SpO2: 96% 96% 97% 97%  Weight:      Height:       Adult male, sitting up in bed, appears somewhat tired and disheveled but interactive and appropriate RRR, no murmurs, no peripheral edema Respiratory rate normal, mild wheezing, coarse and low pitched bilaterally, stable from yesterday to improve Abdomen soft without tenderness palpation or guarding, no ascites or distention Attention normal, affect blunted, judgment and insight appear normal, affect appears also somewhat depressed, densely hemiparetic on the right, speech fluent  Data Reviewed: Discussed with neurology, nursing notes reviewed, vital signs reviewed Basic metabolic panel and VBG, venous blood gas reviewed Vitamin D level reviewed      Disposition: Status is: Inpatient Remains inpatient appropriate because:  The patient was admitted with stroke and has a dense hemiparesis.  He will need significant rehabilitation return to his prior level of function.  He is medically ready for discharge, when a bed in rehab is available           Author: Edwin Dada, MD 12/29/2021 4:38 PM  For on call review [email or web address].

## 2021-12-29 NOTE — Progress Notes (Signed)
Inpatient Rehabilitation Admissions Coordinator   I await [employer] determination for a possible CIR  admit. I began Auth yesterday.  Ottie Glazier, RN, MSN Rehab Admissions Coordinator [phone] 12/29/2021 10:06 AM

## 2021-12-30 ENCOUNTER — Inpatient Hospital Stay (HOSPITAL_COMMUNITY): Payer: Medicare HMO

## 2021-12-30 DIAGNOSIS — I619 Nontraumatic intracerebral hemorrhage, unspecified: Secondary | ICD-10-CM | POA: Diagnosis not present

## 2021-12-30 LAB — GLUCOSE, CAPILLARY
Glucose-Capillary: 113 mg/dL — ABNORMAL HIGH (ref 70–99)
Glucose-Capillary: 143 mg/dL — ABNORMAL HIGH (ref 70–99)
Glucose-Capillary: 216 mg/dL — ABNORMAL HIGH (ref 70–99)
Glucose-Capillary: 119 mg/dL — ABNORMAL HIGH (ref 70–99)

## 2021-12-30 LAB — BASIC METABOLIC PANEL
Anion gap: 7 (ref 5–15)
BUN: 38 mg/dL — ABNORMAL HIGH (ref 8–23)
CO2: 20 mmol/L — ABNORMAL LOW (ref 22–32)
Calcium: 8.8 mg/dL — ABNORMAL LOW (ref 8.9–10.3)
Chloride: 111 mmol/L (ref 98–111)
Creatinine, Ser: 1.57 mg/dL — ABNORMAL HIGH (ref 0.61–1.24)
GFR, Estimated: 48 mL/min — ABNORMAL LOW (ref >60)
Glucose, Bld: 105 mg/dL — ABNORMAL HIGH (ref 70–99)
Potassium: 4.3 mmol/L (ref 3.5–5.1)
Sodium: 138 mmol/L (ref 135–145)

## 2021-12-30 IMAGING — DX DG ABD PORTABLE 1V
1 series · 1 of 1 positions shown · non-contrast
Comparison: None Available.

CLINICAL DATA: Feeding tube placement.

EXAM:
PORTABLE ABDOMEN - 1 VIEW

[abdomen]
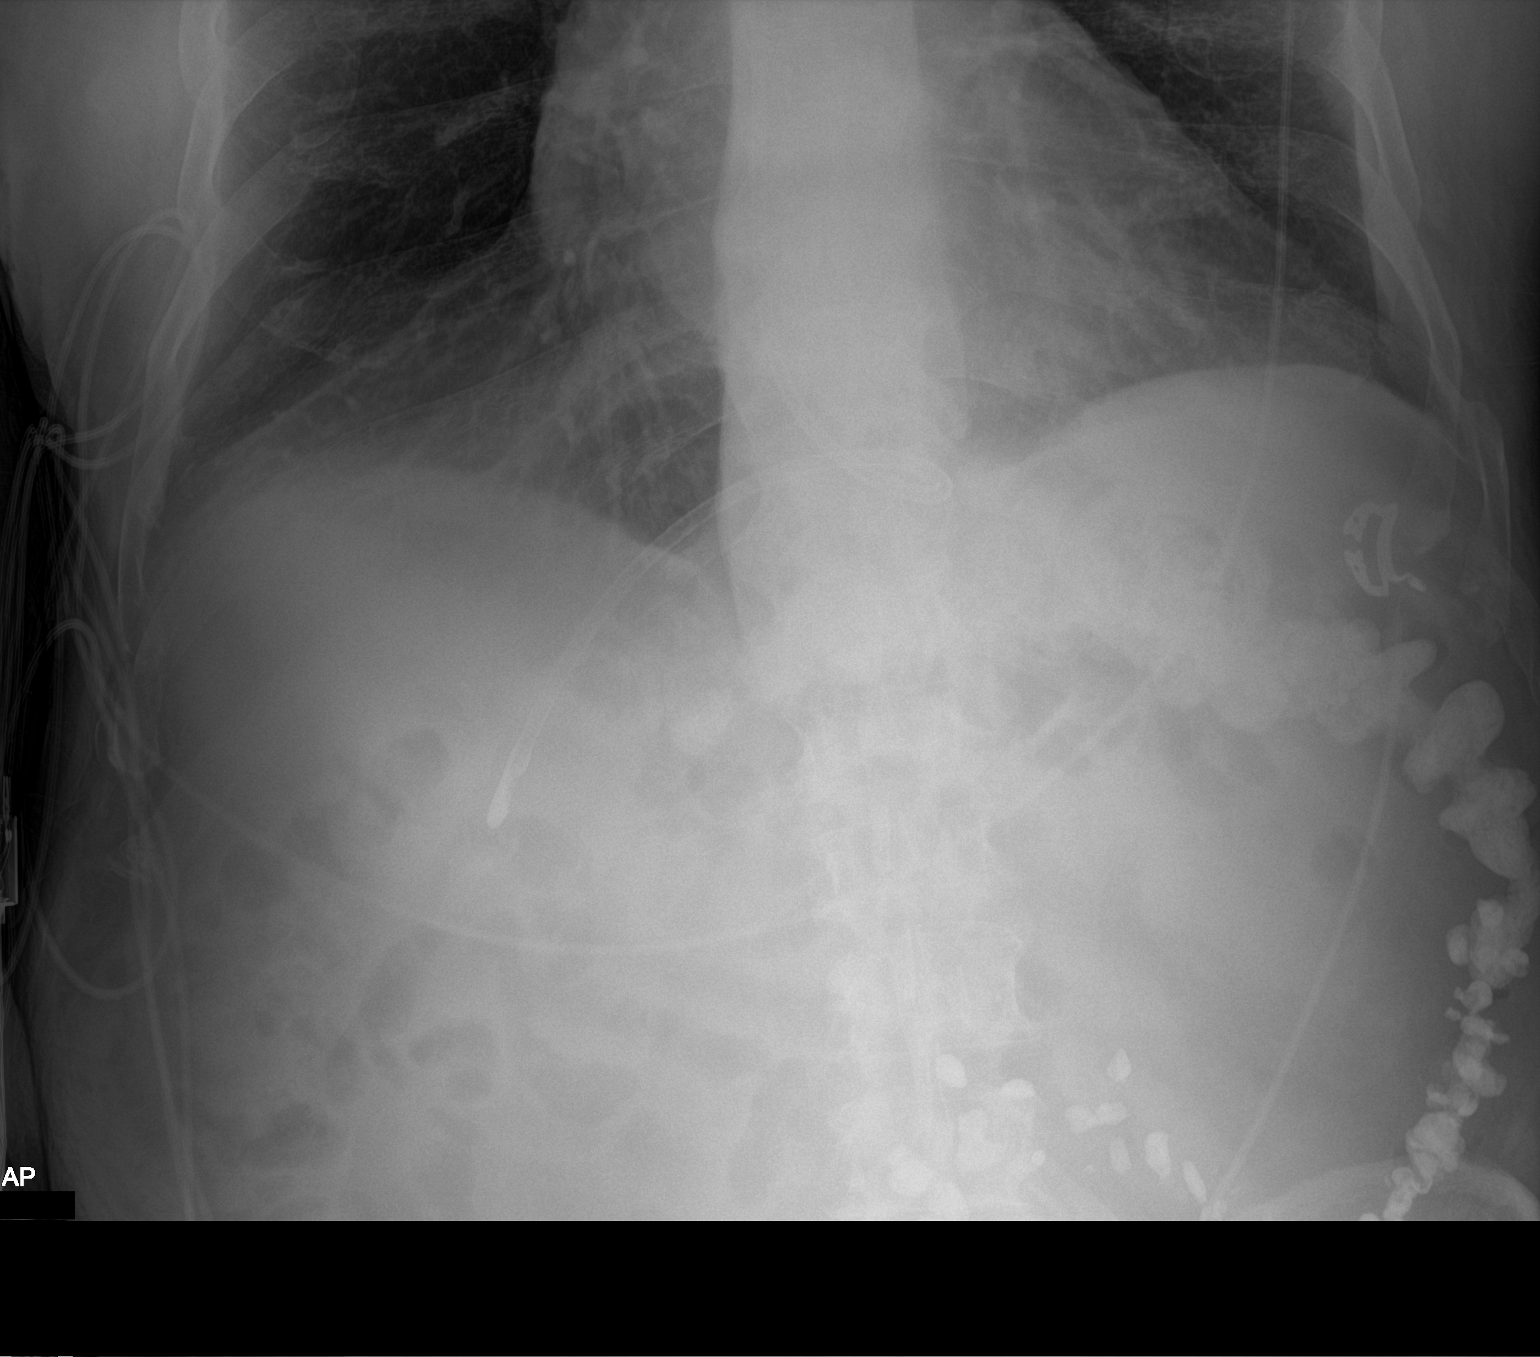

[1 of 1 positions shown; findings below may reference images not displayed]

FINDINGS: A small bore feeding tube is noted with tip appears to overlie the
distal stomach given technique.
IMPRESSION: Small bore feeding tube with tip overlying the distal stomach given
technique. Consider confirmation with radiograph following contrast
injection through the feeding tube, as indicated.

## 2021-12-30 MED ORDER — PROSOURCE TF PO LIQD
45.0000 mL | Freq: Two times a day (BID) | ORAL | Status: DC
  Administered 2021-12-30 – 2021-12-31 (×2): 45 mL
  Filled 2021-12-30 (×2): qty 45

## 2021-12-30 MED ORDER — IPRATROPIUM-ALBUTEROL 0.5-2.5 (3) MG/3ML IN SOLN
3.0000 mL | Freq: Three times a day (TID) | RESPIRATORY_TRACT | Status: DC
  Administered 2021-12-30 – 2021-12-31 (×2): 3 mL via RESPIRATORY_TRACT
  Filled 2021-12-30 (×2): qty 3

## 2021-12-30 MED ORDER — FREE WATER
75.0000 mL | Status: DC
  Administered 2021-12-30 – 2021-12-31 (×6): 75 mL

## 2021-12-30 MED ORDER — JEVITY 1.5 CAL/FIBER PO LIQD
1000.0000 mL | ORAL | Status: DC
  Administered 2021-12-30: 1000 mL
  Filled 2021-12-30 (×4): qty 1000

## 2021-12-30 NOTE — Progress Notes (Signed)
Occupational Therapy Treatment Patient Details Name: Ronnie Jones MRN: [id number] DOB: November 23, 1950 Today's Date: 12/30/2021   History of present illness [name] is a 71 year old gentleman who presented via EMS as a Code Stroke after he was found at home with right sided weakness, sitting in his chair, by his wife. CT showed L subcortical ICH. PMH:  ETOH use (at least 1 pint per day), COPD, HTN, tobacco use (2-3 packs per day), and vitamin D deficiency.   OT comments  Pt progressing towards goals, pt mod A +2 for transfers this session, able to stand at sink for ~2 minutes for weight shifting on counter at sink. Pt tolerating PROM to RUE well, continued education to pt/family on PROM and positioning of RUE, along with looking toward RUE during ADL tasks, PROM, etc, educated pt on use of washcloth on RUE for increased sensation. Pt and spouse verbalized understanding. Pt presenting with impairments listed below, will follow acutely. Continue to recommend AIR at d/c.   Recommendations for follow up therapy are one component of a multi-disciplinary discharge planning process, led by the attending physician.  Recommendations may be updated based on patient status, additional functional criteria and insurance authorization.    Follow Up Recommendations  Acute inpatient rehab (3hours/day)    Assistance Recommended at Discharge Frequent or constant Supervision/Assistance  Patient can return home with the following  A lot of help with walking and/or transfers;A lot of help with bathing/dressing/bathroom;Assistance with cooking/housework;Assistance with feeding;Direct supervision/assist for medications management;Direct supervision/assist for financial management;Assist for transportation;Help with stairs or ramp for entrance   Equipment Recommendations  None recommended by OT;Other (comment) (defer to next venue of care)    Recommendations for Other Services Rehab consult    Precautions /  Restrictions Precautions Precautions: Fall Precaution Comments: R hemiparesis Restrictions Weight Bearing Restrictions: No       Mobility Bed Mobility Overal bed mobility: Needs Assistance Bed Mobility: Rolling, Sidelying to Sit Rolling: Min assist Sidelying to sit: Mod assist, HOB elevated Supine to sit: Mod assist, HOB elevated          Transfers Overall transfer level: Needs assistance Equipment used: 2 person hand held assist Transfers: Sit to/from Stand Sit to Stand: Mod assist, +2 physical assistance                 Balance Overall balance assessment: Needs assistance Sitting-balance support: Single extremity supported, Feet supported Sitting balance-Leahy Scale: Poor Sitting balance - Comments: cannot reach outside BOS Postural control: Right lateral lean Standing balance support: Single extremity supported Standing balance-Leahy Scale: Poor Standing balance comment: reliant on external support                           ADL either performed or assessed with clinical judgement   ADL Overall ADL's : Needs assistance/impaired         Upper Body Bathing: Supervision/ safety;Sitting Upper Body Bathing Details (indicate cue type and reason): simulated, use of LUE to wash RUE             Toilet Transfer: Moderate assistance;+2 for physical assistance Toilet Transfer Details (indicate cue type and reason): simulated to chair         Functional mobility during ADLs: Maximal assistance;+2 for physical assistance;+2 for safety/equipment      Extremity/Trunk Assessment Upper Extremity Assessment Upper Extremity Assessment: RUE deficits/detail RUE Deficits / Details: flaccid RUE Sensation: decreased light touch;decreased proprioception RUE Coordination: decreased fine motor;decreased gross  motor   Lower Extremity Assessment Lower Extremity Assessment: Defer to PT evaluation        Vision   Vision Assessment?: Vision impaired- to be  further tested in functional context Additional Comments: R inattention   Perception Perception Perception: Not tested   Praxis Praxis Praxis: Not tested    Cognition Arousal/Alertness: Awake/alert Behavior During Therapy: Flat affect Overall Cognitive Status: Impaired/Different from baseline Area of Impairment: Attention, Awareness                 Orientation Level: Disoriented to, Situation, Time, Place Current Attention Level: Sustained Memory: Decreased short-term memory Following Commands: Follows one step commands with increased time Safety/Judgement: Decreased awareness of safety, Decreased awareness of deficits Awareness: Intellectual Problem Solving: Slow processing, Difficulty sequencing, Decreased initiation          Exercises      Shoulder Instructions       General Comments VSS on RA, wife present during session    Pertinent Vitals/ Pain       Pain Assessment Pain Assessment: Faces Pain Score: 2  Pain Location: generalized Pain Descriptors / Indicators: Aching Pain Intervention(s): Limited activity within patient's tolerance, Monitored during session, Repositioned  Home Living                                          Prior Functioning/Environment              Frequency  Min 2X/week        Progress Toward Goals  OT Goals(current goals can now be found in the care plan section)  Progress towards OT goals: Progressing toward goals  Acute Rehab OT Goals Patient Stated Goal: none stated OT Goal Formulation: With patient Time For Goal Achievement: 01/05/22 Potential to Achieve Goals: Good ADL Goals Pt Will Perform Grooming: with min assist;sitting Pt Will Perform Upper Body Dressing: with min assist;sitting Pt Will Perform Lower Body Dressing: with mod assist;sit to/from stand Pt Will Transfer to Toilet: with mod assist;stand pivot transfer  Plan Discharge plan remains appropriate;Frequency remains appropriate     Co-evaluation    PT/OT/SLP Co-Evaluation/Treatment: Yes Reason for Co-Treatment: Complexity of the patient's impairments (multi-system involvement);For patient/therapist safety;To address functional/ADL transfers   OT goals addressed during session: ADL's and self-care;Strengthening/ROM      AM-PAC OT "6 Clicks" Daily Activity     Outcome Measure   Help from another person eating meals?: A Lot Help from another person taking care of personal grooming?: A Lot Help from another person toileting, which includes using toliet, bedpan, or urinal?: Total Help from another person bathing (including washing, rinsing, drying)?: A Lot Help from another person to put on and taking off regular upper body clothing?: A Lot Help from another person to put on and taking off regular lower body clothing?: Total 6 Click Score: 10    End of Session Equipment Utilized During Treatment: Gait belt  OT Visit Diagnosis: Unsteadiness on feet (R26.81);Other abnormalities of gait and mobility (R26.89);Muscle weakness (generalized) (M62.81);Hemiplegia and hemiparesis Hemiplegia - Right/Left: Right Hemiplegia - dominant/non-dominant: Dominant Hemiplegia - caused by: Nontraumatic intracerebral hemorrhage   Activity Tolerance Patient tolerated treatment well   Patient Left in chair;with call bell/phone within reach;with chair alarm set   Nurse Communication Mobility status        Time: 0973-5329 OT Time Calculation (min): 31 min  Charges: OT General Charges $OT Visit:  1 Visit OT Treatments $Neuromuscular Re-education: 8-22 mins  Alfonzo Beers, OTD, OTR/L Acute Rehab [phone]   Mayer Masker 12/30/2021, 11:15 AM

## 2021-12-30 NOTE — Progress Notes (Signed)
Physical Therapy Treatment Patient Details Name: Ronnie Jones MRN: [id number] DOB: 25-Nov-1950 Today's Date: 12/30/2021   History of Present Illness Mr. Furukawa is a 71 year old gentleman who presented via EMS as a Code Stroke after he was found at home with right sided weakness, sitting in his chair, by his wife. CT showed L subcortical ICH. PMH:  ETOH use (at least 1 pint per day), COPD, HTN, tobacco use (2-3 packs per day), and vitamin D deficiency.    PT Comments    Pt continues with dense R hemiparesis t/o UE and LE in addition to impaired sensation t/o UE and LE. Pt continues with noted R UE swelling. Focused on standing tolerance and posture with OT at sink today. Continue to require maxA to block R knee to prevent buckling when trying to promote R LE mm contraction. Acute PT to cont to follow. Continue to recommend AIR Upon d/c.    Recommendations for follow up therapy are one component of a multi-disciplinary discharge planning process, led by the attending physician.  Recommendations may be updated based on patient status, additional functional criteria and insurance authorization.  Follow Up Recommendations  Acute inpatient rehab (3hours/day)     Assistance Recommended at Discharge Frequent or constant Supervision/Assistance  Patient can return home with the following Two people to help with walking and/or transfers;Two people to help with bathing/dressing/bathroom;Assistance with feeding;Assistance with cooking/housework;Direct supervision/assist for medications management;Direct supervision/assist for financial management;Assist for transportation;Help with stairs or ramp for entrance   Equipment Recommendations  Wheelchair (measurements PT);Wheelchair cushion (measurements PT);BSC/3in1;Other (comment) (hemiwalker)    Recommendations for Other Services Rehab consult     Precautions / Restrictions Precautions Precautions: Fall Precaution Comments: R  hemiparesis Restrictions Weight Bearing Restrictions: No     Mobility  Bed Mobility Overal bed mobility: Needs Assistance Bed Mobility: Rolling, Sidelying to Sit Rolling: Min assist Sidelying to sit: Mod assist, HOB elevated Supine to sit: Mod assist, HOB elevated Sit to supine: Mod assist, HOB elevated Sit to sidelying: Mod assist General bed mobility comments: directional verbal cues, assist for R LE management off EOB, modA for trunk elevation, pt with solid effort in trying to pull self up with L UE    Transfers Overall transfer level: Needs assistance Equipment used: 2 person hand held assist Transfers: Sit to/from Stand, Bed to chair/wheelchair/BSC Sit to Stand: Mod assist, +2 physical assistance           General transfer comment: pt modAx2 with use of bed pad to power up with R knee blocked, maxA to complete step to transfer to chair, maxA for R LE advancement, requires blocking of R knee to advance L LE    Ambulation/Gait                   Stairs             Wheelchair Mobility    Modified Rankin (Stroke Patients Only) Modified Rankin (Stroke Patients Only) Pre-Morbid Rankin Score: No symptoms Modified Rankin: Severe disability     Balance Overall balance assessment: Needs assistance Sitting-balance support: Single extremity supported, Feet supported Sitting balance-Leahy Scale: Poor Sitting balance - Comments: cannot reach outside BOS Postural control: Right lateral lean Standing balance support: Single extremity supported Standing balance-Leahy Scale: Poor Standing balance comment: worked on standing at EOB and trying to lift up L LE to promote increased WBing through R LE with R knee blocked, pt reports "I can't feel a thing" , worked again in standing at sink  with PT focusing on R Hip and knee kinematics for optimal WBing potential while OT worked with UEs functionally                            Cognition Arousal/Alertness:  Awake/alert Behavior During Therapy: Flat affect Overall Cognitive Status: Impaired/Different from baseline Area of Impairment: Attention, Awareness                 Orientation Level: Disoriented to, Situation, Time, Place Current Attention Level: Sustained Memory: Decreased short-term memory Following Commands: Follows one step commands with increased time Safety/Judgement: Decreased awareness of safety, Decreased awareness of deficits Awareness: Intellectual Problem Solving: Slow processing, Difficulty sequencing, Decreased initiation General Comments: pt remains with flat affect and reports "I feel like shit". Pt gives good  effort during therapy        Exercises General Exercises - Lower Extremity Quad Sets: Right, 10 reps, Supine, AROM    General Comments General comments (skin integrity, edema, etc.): VSS on RA      Pertinent Vitals/Pain Pain Assessment Pain Assessment: No/denies pain    Home Living                          Prior Function            PT Goals (current goals can now be found in the care plan section) Acute Rehab PT Goals Patient Stated Goal: return home PT Goal Formulation: With patient Time For Goal Achievement: 01/05/22 Potential to Achieve Goals: Good Progress towards PT goals: Progressing toward goals    Frequency    Min 4X/week      PT Plan Current plan remains appropriate    Co-evaluation PT/OT/SLP Co-Evaluation/Treatment: Yes Reason for Co-Treatment: Complexity of the patient's impairments (multi-system involvement) PT goals addressed during session: Mobility/safety with mobility OT goals addressed during session: ADL's and self-care;Strengthening/ROM      AM-PAC PT "6 Clicks" Mobility   Outcome Measure  Help needed turning from your back to your side while in a flat bed without using bedrails?: A Lot Help needed moving from lying on your back to sitting on the side of a flat bed without using bedrails?: A  Lot Help needed moving to and from a bed to a chair (including a wheelchair)?: A Lot Help needed standing up from a chair using your arms (e.g., wheelchair or bedside chair)?: A Lot Help needed to walk in hospital room?: Total Help needed climbing 3-5 steps with a railing? : Total 6 Click Score: 10    End of Session Equipment Utilized During Treatment: Gait belt Activity Tolerance: Patient tolerated treatment well Patient left: in chair;with chair alarm set;with family/visitor present;with call bell/phone within reach Nurse Communication: Mobility status PT Visit Diagnosis: Unsteadiness on feet (R26.81);Hemiplegia and hemiparesis;Difficulty in walking, not elsewhere classified (R26.2) Hemiplegia - Right/Left: Right Hemiplegia - dominant/non-dominant: Dominant Hemiplegia - caused by: Nontraumatic SAH     Time: 1610-9604 PT Time Calculation (min) (ACUTE ONLY): 26 min  Charges:  $Neuromuscular Re-education: 8-22 mins                     Lewis Shock, PT, DPT Acute Rehabilitation Services Secure chat preferred Office #: [phone]    Iona Hansen 12/30/2021, 2:43 PM

## 2021-12-30 NOTE — Progress Notes (Signed)
Inpatient Diabetes Program Recommendations  AACE/ADA: New Consensus Statement on Inpatient Glycemic Control (2015)  Target Ranges:  Prepandial:   less than 140 mg/dL      Peak postprandial:   less than 180 mg/dL (1-2 hours)      Critically ill patients:  140 - 180 mg/dL   Lab Results  Component Value Date   GLUCAP 113 (H) 12/30/2021   HGBA1C 6.1 (H) 12/21/2021    Review of Glycemic Control  Latest Reference Range & Units 12/29/21 16:07 12/29/21 21:52 12/29/21 22:14 12/30/21 06:06  Glucose-Capillary 70 - 99 mg/dL 902 (H) 69 (L) 97 409 (H)   Diabetes history: DM 2 Outpatient Diabetes medications:  Metformin 500 mg bid Current orders for Inpatient glycemic control:  Novolog 0-15 units tid with meals and HS Prednisone 30 mg daily Inpatient Diabetes Program Recommendations:    May consider reducing Novolog correction to sensitive (0-9 units) tid with meals and HS.   Thanks,  Beryl Meager, RN, BC-ADM Inpatient Diabetes Coordinator Pager [phone]  (8a-5p)

## 2021-12-30 NOTE — H&P (Signed)
Physical Medicine and Rehabilitation Admission H&P    CC: Left thalamic intracerebral hemorrhage  HPI: Ronnie Jones is a 71 year old male who presented to the ED by EMS with stroke-like symptoms on 12/21/2021. Found by wife on couch with slurred speech and facial drooping. Code stroke initiated. Evaluated by Dr. Cheral Marker. Alcohol intake approximately one pint of tequila each evening. Wife reported significant daily aspirin use; up to ten 81 mg daily. He does not give a reason for the aspirin use only stating " bad habit". On exam, RUE was flaccid with RLE weakness. CTH revealed left subcortical ICH therefore TNK not administered. Admitted to CCM/ICU. Cardene infusion for hypertensive urgency. CIWA protcol. Treated for AE-COPD with steroids, bronchodilators and empiric antibiotics. AKI treated. Transferred out of ICU on 5/19. Weaning steroids. May require Cortak due to lack of adequate oral intake.   2D echo EF 60-65% LDL = 192 A1c = 6.1  Review of Systems  Constitutional:  Positive for chills and fever.  Eyes:  Negative for double vision.  Respiratory:  Positive for cough. Negative for shortness of breath.        Wife endorses snoring, but states patient has not been evaluated for OSA  Cardiovascular:  Negative for chest pain and palpitations.  Gastrointestinal:  Negative for abdominal pain, nausea and vomiting.  Genitourinary:        Has condom cath. States nocturia at home  Neurological:  Negative for dizziness and headaches.  Past Medical History:  Diagnosis Date   CKD (chronic kidney disease)    COPD (chronic obstructive pulmonary disease) ([place])    Hypertension    History reviewed. No pertinent surgical history. History reviewed. No pertinent family history. Social History:  reports that he has been smoking cigarettes. He has a 80.00 pack-year smoking history. He has never used smokeless tobacco. He reports current alcohol use. He reports that he does not use drugs. Allergies:   Allergies  Allergen Reactions   Codeine Nausea Only   Medications Prior to Admission  Medication Sig Dispense Refill   acetaminophen (TYLENOL) 500 MG tablet Take 500 mg by mouth every 6 (six) hours as needed for mild pain or fever.     albuterol (VENTOLIN HFA) 108 (90 Base) MCG/ACT inhaler Inhale 2 puffs into the lungs every 4 (four) hours as needed.     amLODipine (NORVASC) 10 MG tablet Take 10 mg by mouth daily.     aspirin 325 MG tablet Take 650 mg by mouth daily.     aspirin EC 81 MG tablet Take 162 mg by mouth daily. Swallow whole.     atenolol (TENORMIN) 50 MG tablet Take 50 mg by mouth daily.     lisinopril (ZESTRIL) 20 MG tablet Take 20 mg by mouth daily.     metFORMIN (GLUCOPHAGE) 500 MG tablet Take 500 mg by mouth 2 (two) times daily.     Vitamin D, Ergocalciferol, (DRISDOL) 1.25 MG (50000 UNIT) CAPS capsule Take 50,000 Units by mouth once a week.     rosuvastatin (CRESTOR) 20 MG tablet Take 20 mg by mouth every evening. (Patient not taking: Reported on 12/21/2021)        Home: Home Living Family/patient expects to be discharged to:: Private residence Living Arrangements: Spouse/significant other Available Help at Discharge: Available 24 hours/day Type of Home: House Home Access: Stairs to enter [employer] of Steps: 2 Entrance Stairs-Rails: Right, Left, Can reach both Home Layout: One level Bathroom Shower/Tub: Tub/shower unit, Multimedia programmer: Standard Bathroom Accessibility:  Yes Home Equipment: Wheelchair - manual, Conservation officer, nature (2 wheels)  Lives With: Spouse   Functional History: Prior Function Prior Level of Function : Independent/Modified Independent, Driving, Working/employed Mobility Comments: no AD ADLs Comments: lays brick for work  Functional Status:  Mobility: Bed Mobility Overal bed mobility: Needs Assistance Bed Mobility: Rolling, Sidelying to Sit Rolling: Min assist Sidelying to sit: Mod assist, HOB elevated Supine  to sit: Mod assist, HOB elevated Sit to supine: Mod assist, HOB elevated Sit to sidelying: Mod assist General bed mobility comments: directional verbal cues, assist for R LE management off EOB, modA for trunk elevation, pt with solid effort in trying to pull self up with L UE Transfers Overall transfer level: Needs assistance Equipment used: 2 person hand held assist Transfers: Sit to/from Stand Sit to Stand: Mod assist, +2 physical assistance General transfer comment: worked on sit to stand x 5, wife to hold R hand onto stedy due to inability to grip with R hand, modA and max verbal cues to extend R knee, tactile cues at posterior hip to promote R hip extension, pt with quick onset of fatigue, shorter endurance as attempts progressed Ambulation/Gait Ambulation/Gait assistance: Max assist Gait Distance (Feet): 2 Feet Assistive device: 1 person hand held assist Gait Pattern/deviations: Shuffle General Gait Details: x1 sidestep toward [facility] but unstable, unsafe to progress further with only +1 assist Gait velocity interpretation: <1.31 ft/sec, indicative of household ambulator    ADL: ADL Overall ADL's : Needs assistance/impaired Eating/Feeding: Minimal assistance Eating/Feeding Details (indicate cue type and reason): requires cups items stable, can use LUE to self feed Grooming: Minimal assistance Grooming Details (indicate cue type and reason): rubbing lotion on RUE with LUE Upper Body Bathing: Supervision/ safety, Sitting Upper Body Bathing Details (indicate cue type and reason): simulated, use of LUE to wash RUE Lower Body Bathing: Maximal assistance, +2 for physical assistance, +2 for safety/equipment Upper Body Dressing : Moderate assistance, Sitting Lower Body Dressing: Maximal assistance, +2 for physical assistance, +2 for safety/equipment Toilet Transfer: Moderate assistance, +2 for physical assistance Toilet Transfer Details (indicate cue type and reason): simulated to  chair Toileting- Clothing Manipulation and Hygiene: Total assistance Functional mobility during ADLs: Maximal assistance, +2 for physical assistance, +2 for safety/equipment General ADL Comments: R hemi, poor sitting balance, difficulty breathing, impaired cognition  Cognition: Cognition Overall Cognitive Status: Impaired/Different from baseline Arousal/Alertness: Awake/alert Orientation Level: Oriented X4 Year: 2021 Day of Week: Correct Attention: Focused, Sustained Focused Attention: Impaired Sustained Attention: Impaired Memory: Impaired Memory Impairment: Decreased short term memory Problem Solving: Impaired Problem Solving Impairment: Verbal basic Cognition Arousal/Alertness: Awake/alert Behavior During Therapy: Flat affect Overall Cognitive Status: Impaired/Different from baseline Area of Impairment: Attention, Awareness Orientation Level: Disoriented to, Situation, Time, Place Current Attention Level: Sustained Memory: Decreased short-term memory Following Commands: Follows one step commands with increased time Safety/Judgement: Decreased awareness of safety, Decreased awareness of deficits Awareness: Intellectual Problem Solving: Slow processing, Difficulty sequencing, Decreased initiation General Comments: pt more verbal today. R inattention but better than previous session, better command follow this date, delayed processing/sequencing with standing requiring verbal/tactile cues to initiate  Physical Exam: Blood pressure 138/74, pulse 72, temperature 97.6 F (36.4 C), temperature source Oral, resp. rate 20, height 5\' 7"  (1.702 m), weight 77.1 kg, SpO2 96 %. Physical Exam Constitutional:      General: He is not in acute distress. HENT:     Head: Normocephalic and atraumatic.  Pulmonary:     Effort: Pulmonary effort is normal.  Abdominal:  Palpations: Abdomen is soft.  Musculoskeletal:     Right lower leg: Edema present.  Neurological:     Mental Status: He  is alert.    Results for orders placed or performed during the hospital encounter of 12/21/21 (from the past 48 hour(s))  Glucose, capillary     Status: Abnormal   Collection Time: 12/28/21 11:50 AM  Result Value Ref Range   Glucose-Capillary 136 (H) 70 - 99 mg/dL    Comment: Glucose reference range applies only to samples taken after fasting for at least 8 hours.  Glucose, capillary     Status: Abnormal   Collection Time: 12/28/21  4:40 PM  Result Value Ref Range   Glucose-Capillary 181 (H) 70 - 99 mg/dL    Comment: Glucose reference range applies only to samples taken after fasting for at least 8 hours.  Glucose, capillary     Status: None   Collection Time: 12/28/21  9:49 PM  Result Value Ref Range   Glucose-Capillary 77 70 - 99 mg/dL    Comment: Glucose reference range applies only to samples taken after fasting for at least 8 hours.  Basic metabolic panel     Status: Abnormal   Collection Time: 12/29/21  4:29 AM  Result Value Ref Range   Sodium 138 135 - 145 mmol/L   Potassium 4.1 3.5 - 5.1 mmol/L   Chloride 110 98 - 111 mmol/L   CO2 19 (L) 22 - 32 mmol/L   Glucose, Bld 103 (H) 70 - 99 mg/dL    Comment: Glucose reference range applies only to samples taken after fasting for at least 8 hours.   BUN 42 (H) 8 - 23 mg/dL   Creatinine, Ser 1.58 (H) 0.61 - 1.24 mg/dL   Calcium 8.7 (L) 8.9 - 10.3 mg/dL   GFR, Estimated 47 (L) >60 mL/min    Comment: (NOTE) Calculated using the CKD-EPI Creatinine Equation (2021)    Anion gap 9 5 - 15    Comment: Performed at [facility] [address] [place], [place] [address]  Blood gas, venous     Status: Abnormal   Collection Time: 12/29/21  4:30 AM  Result Value Ref Range   pH, Ven 7.41 7.25 - 7.43   pCO2, Ven 34 (L) 44 - 60 mmHg   pO2, Ven 61 (H) 32 - 45 mmHg   Bicarbonate 21.6 20.0 - 28.0 mmol/L   Acid-base deficit 2.4 (H) 0.0 - 2.0 mmol/L   O2 Saturation 90.8 %   Patient temperature 36.5    Collection site LEFT HAND     Drawn by [id number]     Comment: Performed at [facility] [address], [place], [place] [address]  Glucose, capillary     Status: Abnormal   Collection Time: 12/29/21  6:03 AM  Result Value Ref Range   Glucose-Capillary 105 (H) 70 - 99 mg/dL    Comment: Glucose reference range applies only to samples taken after fasting for at least 8 hours.   Comment 1 Notify RN    Comment 2 Document in Chart   Glucose, capillary     Status: Abnormal   Collection Time: 12/29/21 12:06 PM  Result Value Ref Range   Glucose-Capillary 145 (H) 70 - 99 mg/dL    Comment: Glucose reference range applies only to samples taken after fasting for at least 8 hours.   Comment 1 Notify RN    Comment 2 Document in Chart   Glucose, capillary  Status: Abnormal   Collection Time: 12/29/21  4:07 PM  Result Value Ref Range   Glucose-Capillary 246 (H) 70 - 99 mg/dL    Comment: Glucose reference range applies only to samples taken after fasting for at least 8 hours.   Comment 1 Notify RN    Comment 2 Document in Chart   Glucose, capillary     Status: Abnormal   Collection Time: 12/29/21  9:52 PM  Result Value Ref Range   Glucose-Capillary 69 (L) 70 - 99 mg/dL    Comment: Glucose reference range applies only to samples taken after fasting for at least 8 hours.   Comment 1 Notify RN    Comment 2 Document in Chart   Glucose, capillary     Status: None   Collection Time: 12/29/21 10:14 PM  Result Value Ref Range   Glucose-Capillary 97 70 - 99 mg/dL    Comment: Glucose reference range applies only to samples taken after fasting for at least 8 hours.  Basic metabolic panel     Status: Abnormal   Collection Time: 12/30/21  4:12 AM  Result Value Ref Range   Sodium 138 135 - 145 mmol/L   Potassium 4.3 3.5 - 5.1 mmol/L   Chloride 111 98 - 111 mmol/L   CO2 20 (L) 22 - 32 mmol/L   Glucose, Bld 105 (H) 70 - 99 mg/dL    Comment: Glucose reference range applies only to samples taken after fasting for at least 8 hours.    BUN 38 (H) 8 - 23 mg/dL   Creatinine, Ser 1.57 (H) 0.61 - 1.24 mg/dL   Calcium 8.8 (L) 8.9 - 10.3 mg/dL   GFR, Estimated 48 (L) >60 mL/min    Comment: (NOTE) Calculated using the CKD-EPI Creatinine Equation (2021)    Anion gap 7 5 - 15    Comment: Performed at [facility] [address] [place], [place] [address]  Glucose, capillary     Status: Abnormal   Collection Time: 12/30/21  6:06 AM  Result Value Ref Range   Glucose-Capillary 113 (H) 70 - 99 mg/dL    Comment: Glucose reference range applies only to samples taken after fasting for at least 8 hours.   No results found.    Blood pressure 138/74, pulse 72, temperature 97.6 F (36.4 C), temperature source Oral, resp. rate 20, height 5\' 7"  (1.702 m), weight 77.1 kg, SpO2 96 %.  Medical Problem List and Plan: 1. 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  Antithrombotics: -DVT/anticoagulation:  Pharmaceutical: Heparin  -antiplatelet therapy: none 3. Pain Management: Tylenol as needed 4. Mood: LCSW to evaluate and provide emotional support  -antipsychotic agents: n/a 5. Neuropsych: This patient is capable of making decisions on his own behalf. 6. Skin/Wound Care: Routine skin care checks 7. Fluids/Electrolytes/Nutrition: Routine Is and Os and follow-up chemistries  --dysphagia 1; honey thick 8: DM2 on metformin at home>>held 9: Hypertension: lisinopril D/C due to AKI --continue amlodipine, atenolol, hydralazine 10: COPD, acute exacerbation: Anoro Ellipta daily --Duoneb prn 11: Alcohol abuse: cessation counseling --continue [id number], MVI and folic acid 12: Tobacco abuse: cessation counseling --continue nicotine patch 13: Hyperlipidemia: continue Crestor 40 mg 14: AKI: monitor fluid intake (on honey thick) follow-up BMP ***  Barbie Banner, PA-C 12/30/2021

## 2021-12-30 NOTE — Progress Notes (Incomplete)
Inpatient Rehabilitation Admission Medication Review by a Pharmacist  A complete drug regimen review was completed for this patient to identify any potential clinically significant medication issues.  High Risk Drug Classes Is patient taking? Indication by Medication  Antipsychotic Yes Compazine- N/V  Anticoagulant Yes Heparin- VTE prophylaxis  Antibiotic No   Opioid No   Antiplatelet No   Hypoglycemics/insulin Yes Insulin- T2DM  Vasoactive Medication Yes Norvasc, atenolol, hydralazine- hypertension  Chemotherapy No   Other Yes Melatonin- sleep Protonix- GERD Crestor- HLD Anoro Ellipta- COPD     Type of Medication Issue Identified Description of Issue Recommendation(s)  Drug Interaction(s) (clinically significant)     Duplicate Therapy     Allergy     No Medication Administration End Date     Incorrect Dose     Additional Drug Therapy Needed     Significant med changes from prior encounter (inform family/care partners about these prior to discharge).    Other  PTA meds: Albuterol HFA 2 puffs q4h prn SOB / wheezing aspirin 812 mg daily lisinopril 20 mg daily metformin 500 mg bid Restart PTA meds when and if necessary during CIR admission or at time of discharge, if warranted  Off aspirin at this point in time 2/2 ICH    Clinically significant medication issues were identified that warrant physician communication and completion of prescribed/recommended actions by midnight of the next day:  No   Time spent performing this drug regimen review (minutes):  30   [name] BS, PharmD, BCPS Clinical Pharmacist 12/30/2021 12:57 PM  Contact: [phone] after 3 PM  "Be curious, not judgmental..." -Debbora Dus

## 2021-12-30 NOTE — Progress Notes (Signed)
Inpatient Rehabilitation Admissions Coordinator   I continue to await [facility] Auth for  a possible Cir admit.  Ottie Glazier, RN, MSN Rehab Admissions Coordinator [phone] 12/30/2021 10:35 AM

## 2021-12-30 NOTE — Progress Notes (Signed)
Nutrition Follow-up  DOCUMENTATION CODES:  Not applicable  INTERVENTION:  Continue current diet as ordered per SLP Advance as able Obtain new measured weight Magic cup TID with meals, each supplement provides 290 kcal and 9 grams of protein Recommend placement of cortrak tube to provide sufficient nutrition. Utilize the following TF orders:  Jevity 1.5 @ 24mL/h (1343mL/h). Start at 57mL/h and advance by 44mL q4h to goal of 55. Free water flush of 9mL q4h Prosource TF BID (40kcal and 11g of protein per packet) This regimen provides 2060 kcal, 106g of protein, and of free water (TF+flush)  NUTRITION DIAGNOSIS:  Increased nutrient needs related to chronic illness (COPD) as evidenced by estimated needs. 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  5/19 - MBS, SLP Diet Recommendations: Dysphagia 1 (Puree) solids; Honey thick  Pt resting in bedside chair at the time of assessment. Wife at bedside. Reviewed breakfast tray at bedside mostly untouched. Pt had eaten a few bites of puree french toast and a few bites of applesauce. Inquired about intake this week and wife reports this is about typical. Consuming <25% of food on trays. Pt states he does not have an appetite, can not thing of anything that sounds appealing to him or that would encourage him to eat more.   Asked pt about his energy and he admits that it is poor. Discussed the relationship between nutrition, energy, and preservation of muscle mass. Discussed that at rehab pt will be doing therapies for several hours a day and he will need to increase his energy intake in order to obtain this energy. Discussed the possibility of a  cortrak tube with placement. Educated on the benefits of being able to provide the nutrition his body needs and pt being able to continue PO with the tube to continue his swallowing rehabilitation. Pt is agreeable to tube placement. Discussed conversation with MD.  Average Meal Intake: 5/22-5/24: 13% intake x 3 recorded meals  Nutritionally Relevant Medications: Scheduled Meds:  vitamin C  500 mg Oral Daily   folic acid  1 mg Oral Daily   insulin aspart  0-15 Units Subcutaneous TID WC   insulin aspart  0-5 Units Subcutaneous QHS   multivitamin with minerals  1 tablet Oral Daily   pantoprazole  40 mg Oral Daily   predniSONE  30 mg Oral Q breakfast   rosuvastatin  40 mg Oral QHS   senna-docusate  1 tablet Oral BID   thiamine  100 mg Oral Daily   Vitamin D (Ergocalciferol)  50,000 Units Oral Q7 days   Labs Reviewed: BUN 38, creatinine 1.57 CBG ranges from 69-246 mg/dL over the last 24 hours HgbA1c 6.1% (5/15)  Vitamin/Mineral Labs Vitamin A 91.8 (high) Vitamin C <0.1 (low) Vitamin D 17.96 (low) Vitamin B6 inconclusive Vitamin B12 703 Zinc 44 Copper 118  NUTRITION - FOCUSED PHYSICAL EXAM: Flowsheet Row Most Recent Value  Orbital Region No depletion  Upper Arm Region Mild depletion  Thoracic and Lumbar Region No depletion  Buccal Region No depletion  Temple Region No depletion  Clavicle Bone Region No depletion  Clavicle and Acromion Bone Region No depletion  Scapular Bone Region No  depletion  Dorsal Hand Moderate depletion  Patellar Region Severe depletion  Anterior Thigh Region Severe depletion  Posterior Calf Region Severe depletion  Edema (RD Assessment) None  Hair Reviewed  Eyes Reviewed  Mouth Reviewed  Skin Reviewed  Nails Reviewed  [pale nail beds]   Diet Order:   Diet Order             DIET - DYS 1 Room service appropriate? No; Fluid consistency: Honey Thick  Diet effective now                   EDUCATION NEEDS:  Not appropriate for education at  this time  Skin:  Skin Assessment: Reviewed RN Assessment  Last BM:  5/21  Height:  Ht Readings from Last 1 Encounters:  12/21/21 5\' 7"  (1.702 m)    Weight:  Wt Readings from Last 1 Encounters:  12/21/21 77.1 kg    Ideal Body Weight:  67.3 kg  BMI:  Body mass index is 26.63 kg/m.  Estimated Nutritional Needs:  Kcal:  1900-2100 Protein:  100-110 g/d Fluid:  > 1.9 L/day   12/23/21, RD, LDN Clinical Dietitian RD pager # available in [facility]  After hours/weekend pager # available in [facility]

## 2021-12-30 NOTE — Progress Notes (Signed)
Progress Note  Patient: Ronnie Jones FAO:[id number] DOB: 09-06-1950 DOA: 12/21/2021     9 DOS: the patient was seen and examined on 12/30/2021     71 y.o. M with DM, HTN, COPD FEV1 46% by PFT, 23% by spirometry in 2019, alcohol dependence (1 pint per day), 2-3 PPD smoker + lung nodules  5/15 wife found him at home in a chair, right sided weakness, right facial droop.    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, reported taking 10 x low-dose aspirin (812 mg) per day--- found to have subcortical ICH therefore TNK not administered-initial NIH 9--- antiplatelet therapy initially held 5/17: developed respiratory distress, PCCM consulted, placed on BiPAP, started on steroids and antibiotics, repeat CTH no change 5/18: Off BiPAP--Copper B12 vitamin A vitamin C ordered[?] 5/19: Nicardipine d/c'd, Cr slightly better with IV fluids 5/21: Phenobarb completed, respiratory status improving now, transitioned to PO steroids 5/22-5/23: Stable, pending CIR  Assessment and Plan:  Intraparenchymal hemorrhage on admission 5/15--repeat scan 5/21 normal evolution Hypertensive urgency on admission Supportive management, Crestor 40--poor candidacy antiplatelet given hemorrhage Stringent diet control below goal 140 systolic-continue amlodipine 10, hydralazine 100 every 8, atenolol 50 daily with as needed labetalol 10-20 every 10 as needed blood pressure to bring down below 160 He is not meeting his caloric needs and will need a core track placed for nutrition Suspected aspiration pneumonia on admission with acute respiratory failure and hypoxia Likely exacerbation COPD on admission Completed 5 days antibiotics while in ICU Previously on BiPAP now liberated Continue prednisone 30 daily with taper Continue dysphagia diet given dense paresis AKI likely secondary to IPH physiology and endorgan damage on admission [? HTN crisis] NAGMA on admit AKI Resolved-keep  volumes even Acidosis still re-equilibrating--might consider bicarb if needed Alcohol abuse disorder Had complicated withdrawal-was placed on phenobarbital-he is not having DTs now-I have discontinued his Ativan and CIWA protocol Smoker solid lung nodule 6 mm Outpatient CT scan in 3 months  Subjective:   Awake coherent patient not really eating or drinking a whole lot about 25% Densely hemiparetic on the right side with significant swelling He has a little bit of hemineglect on the right side   Physical Exam: Vitals:   12/30/21 0815 12/30/21 0854 12/30/21 1053 12/30/21 1110  BP: 130/74   138/74  Pulse: 64 71  72  Resp: 20 18  20   Temp: 98.3 F (36.8 C)   97.6 F (36.4 C)  TempSrc: Oral   Oral  SpO2: 96% 95% 95% 96%  Weight:      Height:       Distress EOMI NCAT pupils are about 5 mm Chest is clear no rales rhonchi Dense paresis on the right side No slurring of speech but is monosyllabic Abdomen is soft Sensory is grossly intact Chest is clear no added sound rales rhonchi   Data Reviewed: CBC    Component Value Date/Time   WBC 7.4 12/28/2021 0111   RBC 4.68 12/28/2021 0111   HGB 14.1 12/28/2021 0111   HCT 42.1 12/28/2021 0111   PLT 211 12/28/2021 0111   MCV 90.0 12/28/2021 0111   MCH 30.1 12/28/2021 0111   MCHC 33.5 12/28/2021 0111   RDW 13.3 12/28/2021 0111   LYMPHSABS 1.2 12/21/2021 1737   MONOABS 0.8 12/21/2021 1737   EOSABS 0.2 12/21/2021 1737   BASOSABS 0.1 12/21/2021 1737   BMET    Component Value Date/Time   NA 138 12/30/2021 0412   K 4.3 12/30/2021  0412   CL 111 12/30/2021 0412   CO2 20 (L) 12/30/2021 0412   GLUCOSE 105 (H) 12/30/2021 0412   BUN 38 (H) 12/30/2021 0412   CREATININE 1.57 (H) 12/30/2021 0412   CALCIUM 8.8 (L) 12/30/2021 0412   GFRNONAA 48 (L) 12/30/2021 0412        Disposition: Status is: Inpatient Remains inpatient appropriate because:  The patient was admitted with stroke and has a dense hemiparesis.  He will need  significant rehabilitation return to his prior level of function.  He is medically ready for discharge, when a bed in rehab is available           Author: Rhetta Mura, MD 12/30/2021 11:41 AM  For on call review [email or web address].

## 2021-12-30 NOTE — Procedures (Signed)
Cortrak  Person Inserting Tube:  Dimarco Minkin C, RD Tube Type:  Cortrak - 43 inches Tube Size:  10 Tube Location:  Right nare Secured by: Bridle Technique Used to Measure Tube Placement:  Marking at nare/corner of mouth Cortrak Secured At:  68 cm  Cortrak Tube Team Note:  Consult received to place a Cortrak feeding tube.   X-ray is required, abdominal x-ray has been ordered by the Cortrak team. Please confirm tube placement before using the Cortrak tube.   If the tube becomes dislodged please keep the tube and contact the Cortrak team at [email or web address] (password [email or web address]) for replacement.  If after hours and replacement cannot be delayed, place a NG tube and confirm placement with an abdominal x-ray.    Keenen Roessner P., RD, LDN, CNSC See [facility] for contact information    

## 2021-12-30 NOTE — Progress Notes (Signed)
SLP Cancellation Note  Patient Details Name: Ronnie Jones MRN: [id number] DOB: Sep 23, 1950   Cancelled treatment:       Reason Eval/Treat Not Completed: Patient at procedure or test/unavailable (Pt currently with RT and about to receive breathing treatment for SOB. SLP will follow up as schedule allows.)  Latrelle Fuston I. Hardin Negus, [place], [place] Office number [phone] Pager [place] 12/30/2021, 2:28 PM

## 2021-12-31 ENCOUNTER — Inpatient Hospital Stay (HOSPITAL_COMMUNITY)
Admission: RE | Admit: 2021-12-31 | Discharge: 2022-01-21 | DRG: 057 | Disposition: A | Discharge status: Hospice | Payer: Medicare HMO | Source: Intra-hospital | Attending: Physical Medicine & Rehabilitation | Admitting: Physical Medicine & Rehabilitation

## 2021-12-31 ENCOUNTER — Encounter (HOSPITAL_COMMUNITY): Payer: Self-pay | Admitting: Neurology

## 2021-12-31 ENCOUNTER — Inpatient Hospital Stay (HOSPITAL_COMMUNITY): Payer: Medicare HMO

## 2021-12-31 DIAGNOSIS — Z7982 Long term (current) use of aspirin: Secondary | ICD-10-CM

## 2021-12-31 DIAGNOSIS — I69122 Dysarthria following nontraumatic intracerebral hemorrhage: Secondary | ICD-10-CM

## 2021-12-31 DIAGNOSIS — G479 Sleep disorder, unspecified: Secondary | ICD-10-CM | POA: Diagnosis not present

## 2021-12-31 DIAGNOSIS — R32 Unspecified urinary incontinence: Secondary | ICD-10-CM | POA: Diagnosis present

## 2021-12-31 DIAGNOSIS — R1312 Dysphagia, oropharyngeal phase: Secondary | ICD-10-CM | POA: Diagnosis not present

## 2021-12-31 DIAGNOSIS — I639 Cerebral infarction, unspecified: Secondary | ICD-10-CM | POA: Diagnosis not present

## 2021-12-31 DIAGNOSIS — F17210 Nicotine dependence, cigarettes, uncomplicated: Secondary | ICD-10-CM | POA: Diagnosis present

## 2021-12-31 DIAGNOSIS — E875 Hyperkalemia: Secondary | ICD-10-CM | POA: Diagnosis present

## 2021-12-31 DIAGNOSIS — I10 Essential (primary) hypertension: Secondary | ICD-10-CM | POA: Diagnosis present

## 2021-12-31 DIAGNOSIS — R64 Cachexia: Secondary | ICD-10-CM | POA: Diagnosis present

## 2021-12-31 DIAGNOSIS — R1310 Dysphagia, unspecified: Secondary | ICD-10-CM | POA: Diagnosis present

## 2021-12-31 DIAGNOSIS — J690 Pneumonitis due to inhalation of food and vomit: Secondary | ICD-10-CM | POA: Diagnosis not present

## 2021-12-31 DIAGNOSIS — I69192 Facial weakness following nontraumatic intracerebral hemorrhage: Secondary | ICD-10-CM

## 2021-12-31 DIAGNOSIS — N179 Acute kidney failure, unspecified: Secondary | ICD-10-CM | POA: Diagnosis present

## 2021-12-31 DIAGNOSIS — I69128 Other speech and language deficits following nontraumatic intracerebral hemorrhage: Secondary | ICD-10-CM

## 2021-12-31 DIAGNOSIS — J449 Chronic obstructive pulmonary disease, unspecified: Secondary | ICD-10-CM | POA: Diagnosis present

## 2021-12-31 DIAGNOSIS — I69120 Aphasia following nontraumatic intracerebral hemorrhage: Secondary | ICD-10-CM

## 2021-12-31 DIAGNOSIS — F0631 Mood disorder due to known physiological condition with depressive features: Secondary | ICD-10-CM

## 2021-12-31 DIAGNOSIS — I619 Nontraumatic intracerebral hemorrhage, unspecified: Secondary | ICD-10-CM | POA: Diagnosis present

## 2021-12-31 DIAGNOSIS — F1010 Alcohol abuse, uncomplicated: Secondary | ICD-10-CM | POA: Diagnosis present

## 2021-12-31 DIAGNOSIS — Z515 Encounter for palliative care: Secondary | ICD-10-CM | POA: Diagnosis not present

## 2021-12-31 DIAGNOSIS — E119 Type 2 diabetes mellitus without complications: Secondary | ICD-10-CM | POA: Diagnosis present

## 2021-12-31 DIAGNOSIS — I82401 Acute embolism and thrombosis of unspecified deep veins of right lower extremity: Secondary | ICD-10-CM | POA: Diagnosis not present

## 2021-12-31 DIAGNOSIS — E1169 Type 2 diabetes mellitus with other specified complication: Secondary | ICD-10-CM | POA: Diagnosis not present

## 2021-12-31 DIAGNOSIS — Z79899 Other long term (current) drug therapy: Secondary | ICD-10-CM

## 2021-12-31 DIAGNOSIS — I69191 Dysphagia following nontraumatic intracerebral hemorrhage: Secondary | ICD-10-CM

## 2021-12-31 DIAGNOSIS — G4701 Insomnia due to medical condition: Secondary | ICD-10-CM | POA: Diagnosis present

## 2021-12-31 DIAGNOSIS — I82621 Acute embolism and thrombosis of deep veins of right upper extremity: Secondary | ICD-10-CM | POA: Diagnosis present

## 2021-12-31 DIAGNOSIS — S301XXD Contusion of abdominal wall, subsequent encounter: Secondary | ICD-10-CM

## 2021-12-31 DIAGNOSIS — Z6824 Body mass index (BMI) 24.0-24.9, adult: Secondary | ICD-10-CM

## 2021-12-31 DIAGNOSIS — Z66 Do not resuscitate: Secondary | ICD-10-CM | POA: Diagnosis not present

## 2021-12-31 DIAGNOSIS — Z716 Tobacco abuse counseling: Secondary | ICD-10-CM

## 2021-12-31 DIAGNOSIS — R54 Age-related physical debility: Secondary | ICD-10-CM | POA: Diagnosis present

## 2021-12-31 DIAGNOSIS — E559 Vitamin D deficiency, unspecified: Secondary | ICD-10-CM | POA: Diagnosis present

## 2021-12-31 DIAGNOSIS — Z7984 Long term (current) use of oral hypoglycemic drugs: Secondary | ICD-10-CM

## 2021-12-31 DIAGNOSIS — F32A Depression, unspecified: Secondary | ICD-10-CM | POA: Diagnosis present

## 2021-12-31 DIAGNOSIS — J9811 Atelectasis: Secondary | ICD-10-CM | POA: Diagnosis not present

## 2021-12-31 DIAGNOSIS — R140 Abdominal distension (gaseous): Secondary | ICD-10-CM | POA: Diagnosis not present

## 2021-12-31 DIAGNOSIS — R339 Retention of urine, unspecified: Secondary | ICD-10-CM | POA: Diagnosis not present

## 2021-12-31 DIAGNOSIS — K5900 Constipation, unspecified: Secondary | ICD-10-CM | POA: Diagnosis not present

## 2021-12-31 DIAGNOSIS — I69151 Hemiplegia and hemiparesis following nontraumatic intracerebral hemorrhage affecting right dominant side: Principal | ICD-10-CM

## 2021-12-31 DIAGNOSIS — Z7141 Alcohol abuse counseling and surveillance of alcoholic: Secondary | ICD-10-CM

## 2021-12-31 DIAGNOSIS — E785 Hyperlipidemia, unspecified: Secondary | ICD-10-CM | POA: Diagnosis present

## 2021-12-31 DIAGNOSIS — J441 Chronic obstructive pulmonary disease with (acute) exacerbation: Secondary | ICD-10-CM | POA: Diagnosis present

## 2021-12-31 DIAGNOSIS — Z885 Allergy status to narcotic agent status: Secondary | ICD-10-CM

## 2021-12-31 DIAGNOSIS — Z7189 Other specified counseling: Secondary | ICD-10-CM | POA: Diagnosis not present

## 2021-12-31 DIAGNOSIS — R609 Edema, unspecified: Secondary | ICD-10-CM | POA: Diagnosis not present

## 2021-12-31 DIAGNOSIS — E54 Ascorbic acid deficiency: Secondary | ICD-10-CM | POA: Diagnosis present

## 2021-12-31 DIAGNOSIS — I82629 Acute embolism and thrombosis of deep veins of unspecified upper extremity: Secondary | ICD-10-CM

## 2021-12-31 LAB — GLUCOSE, CAPILLARY
Glucose-Capillary: 217 mg/dL — ABNORMAL HIGH (ref 70–99)
Glucose-Capillary: 226 mg/dL — ABNORMAL HIGH (ref 70–99)
Glucose-Capillary: 131 mg/dL — ABNORMAL HIGH (ref 70–99)

## 2021-12-31 LAB — MAGNESIUM: Magnesium: 2.4 mg/dL (ref 1.7–2.4)

## 2021-12-31 LAB — PHOSPHORUS: Phosphorus: 3.7 mg/dL (ref 2.5–4.6)

## 2021-12-31 IMAGING — DX DG CHEST 2V
2 series · 2 of 2 positions shown · non-contrast
Comparison: [DATE]

CLINICAL DATA: Short of breath

EXAM:
CHEST - 2 VIEW

[chest lat]
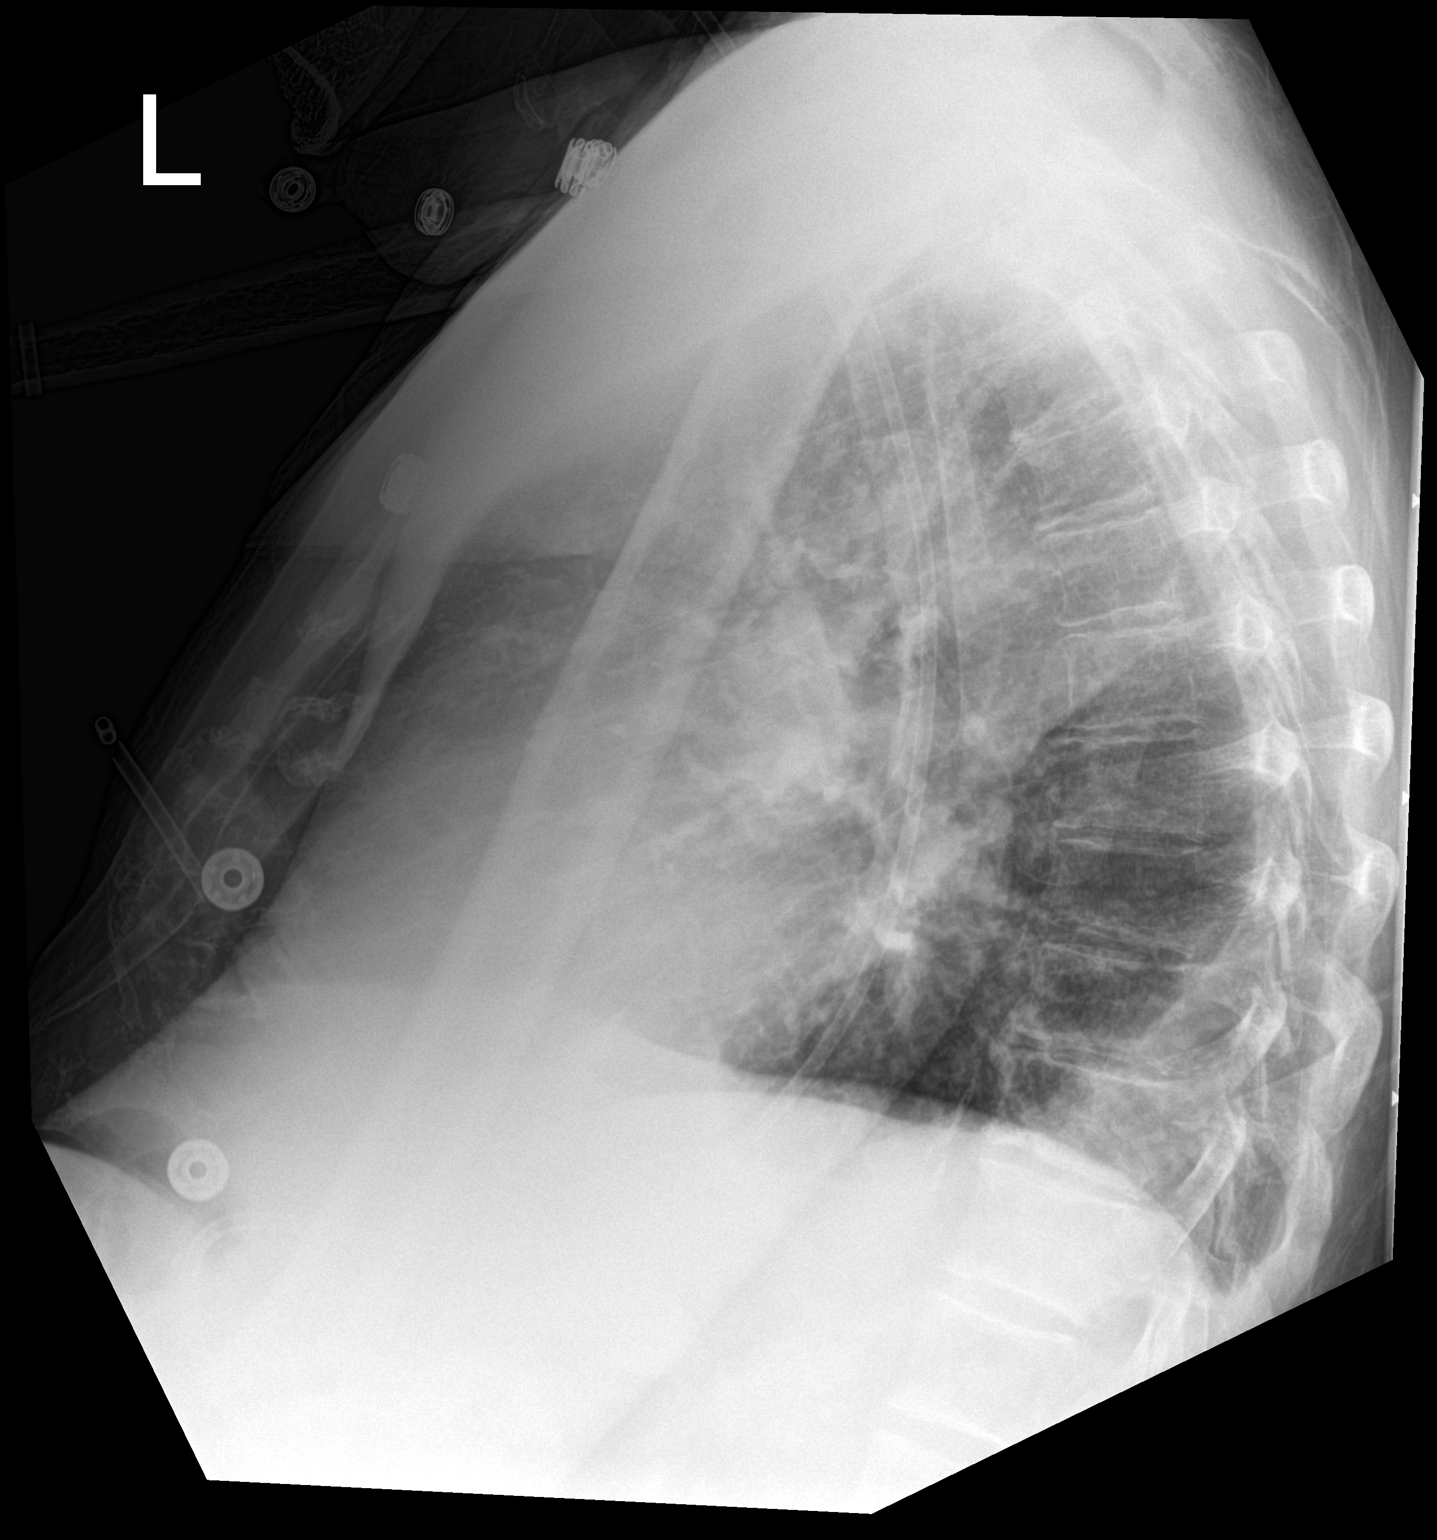

[chest ap]
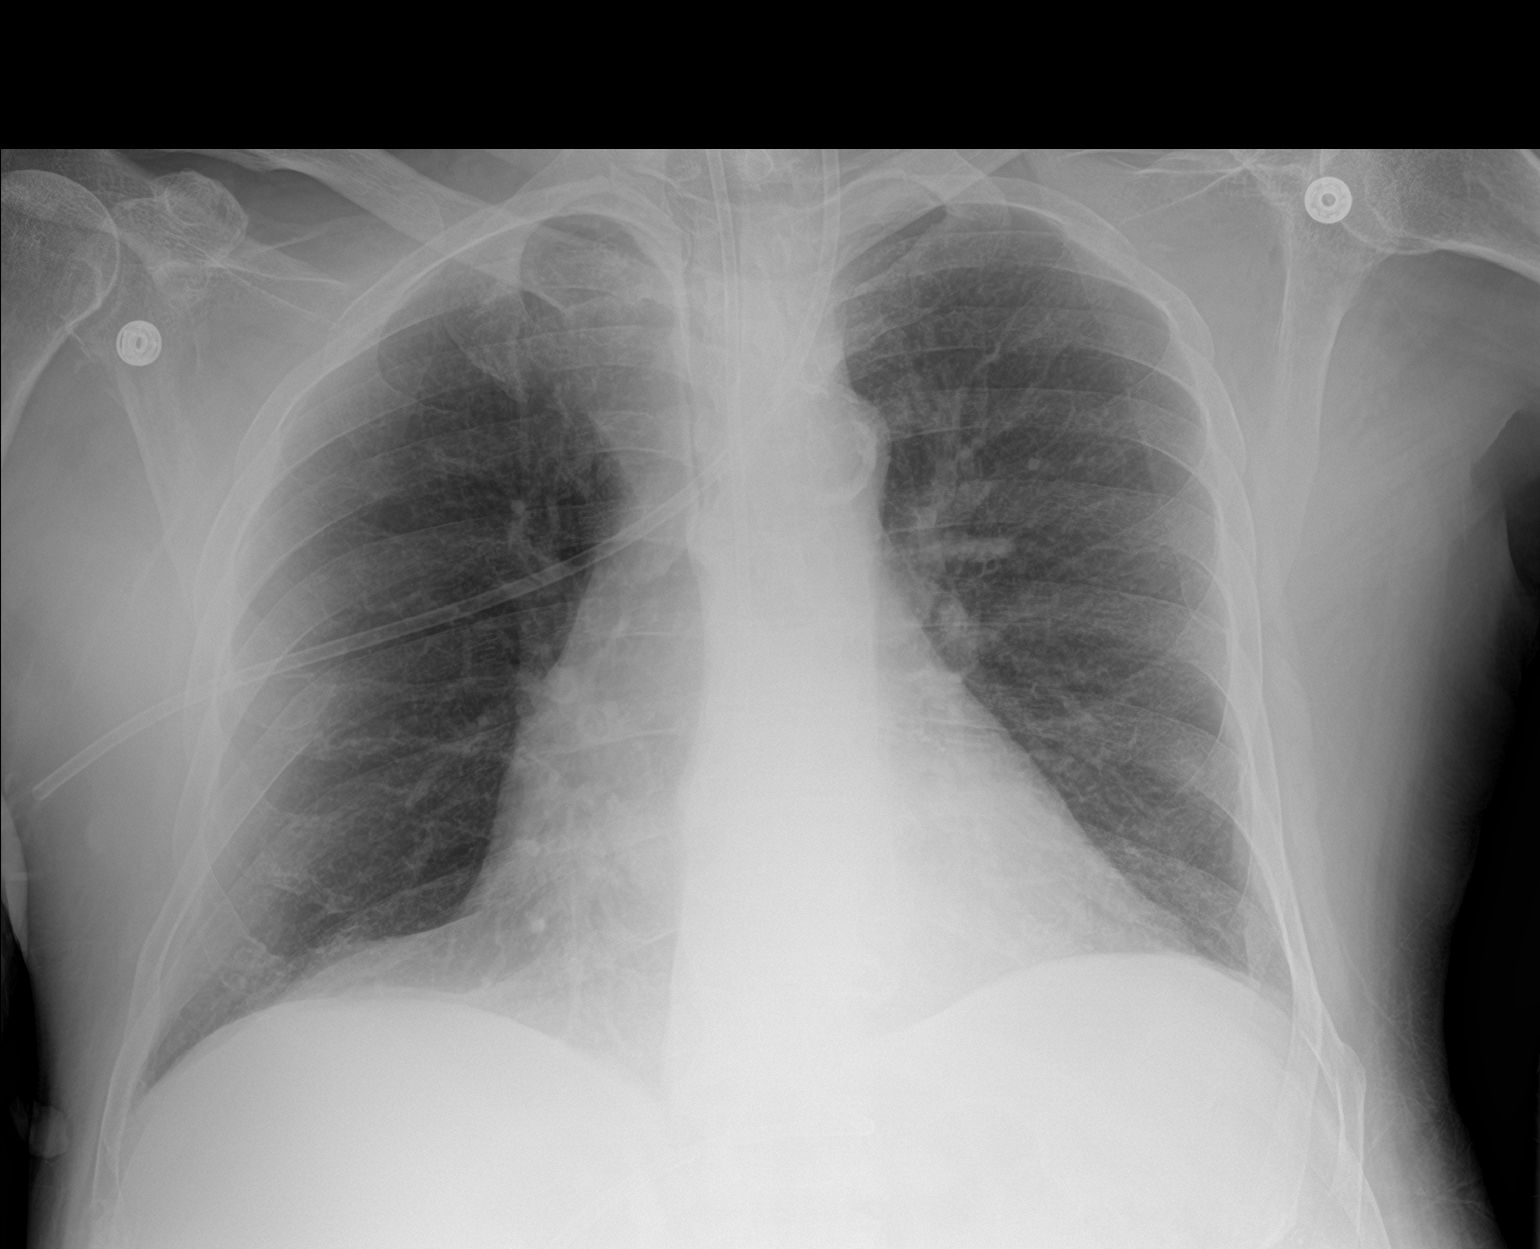

[2 of 2 positions shown; findings below may reference images not displayed]

FINDINGS: Heart size upper normal. Vascularity normal. Lungs clear without
infiltrate or effusion.

Feeding tube passes into the stomach with the tip not visualized.

Chronic left rib fracture
IMPRESSION: No active cardiopulmonary disease.

## 2021-12-31 MED ORDER — ASCORBIC ACID 500 MG PO TABS
500.0000 mg | ORAL_TABLET | Freq: Every day | ORAL | Status: DC
  Administered 2022-01-01: 500 mg via ORAL
  Filled 2021-12-31: qty 1

## 2021-12-31 MED ORDER — ENOXAPARIN SODIUM 40 MG/0.4ML IJ SOSY
40.0000 mg | PREFILLED_SYRINGE | INTRAMUSCULAR | Status: DC
Start: 2022-01-01 — End: 2022-01-01
  Administered 2022-01-01: 40 mg via SUBCUTANEOUS
  Filled 2021-12-31: qty 0.4

## 2021-12-31 MED ORDER — FREE WATER
100.0000 mL | Freq: Three times a day (TID) | Status: DC
  Administered 2021-12-31 – 2022-01-01 (×3): 100 mL

## 2021-12-31 MED ORDER — PROCHLORPERAZINE 25 MG RE SUPP
12.5000 mg | Freq: Four times a day (QID) | RECTAL | Status: DC | PRN
Start: 2021-12-31 — End: 2022-01-21

## 2021-12-31 MED ORDER — TRAZODONE HCL 50 MG PO TABS
25.0000 mg | ORAL_TABLET | Freq: Every evening | ORAL | Status: DC | PRN
  Administered 2022-01-04: 50 mg via ORAL
  Filled 2021-12-31 (×2): qty 1

## 2021-12-31 MED ORDER — FLEET ENEMA 7-19 GM/118ML RE ENEM
1.0000 | ENEMA | Freq: Once | RECTAL | Status: AC | PRN
  Administered 2022-01-04: 1 via RECTAL
  Filled 2021-12-31: qty 1

## 2021-12-31 MED ORDER — FOLIC ACID 1 MG PO TABS
1.0000 mg | ORAL_TABLET | Freq: Every day | ORAL | Status: DC
  Administered 2022-01-01 – 2022-01-19 (×19): 1 mg via ORAL
  Filled 2021-12-31 (×19): qty 1

## 2021-12-31 MED ORDER — PANTOPRAZOLE SODIUM 40 MG PO TBEC: 40.0000 mg | DELAYED_RELEASE_TABLET | Freq: Every day | ORAL | Status: DC

## 2021-12-31 MED ORDER — PREDNISONE 10 MG PO TABS: ORAL_TABLET | ORAL | 0 refills | Status: DC

## 2021-12-31 MED ORDER — FREE WATER: 75.0000 mL | Status: DC

## 2021-12-31 MED ORDER — NICOTINE 21 MG/24HR TD PT24: 21.0000 mg | MEDICATED_PATCH | Freq: Every day | TRANSDERMAL | 0 refills | Status: DC

## 2021-12-31 MED ORDER — ACETAMINOPHEN 325 MG PO TABS
325.0000 mg | ORAL_TABLET | ORAL | Status: DC | PRN
  Administered 2022-01-04 – 2022-01-10 (×6): 650 mg via ORAL
  Filled 2021-12-31 (×6): qty 2

## 2021-12-31 MED ORDER — MELATONIN 3 MG PO TABS: 3.0000 mg | ORAL_TABLET | Freq: Every day | ORAL | 0 refills | Status: AC

## 2021-12-31 MED ORDER — INSULIN ASPART 100 UNIT/ML IJ SOLN: 0.0000 [IU] | Freq: Every day | INTRAMUSCULAR | Status: DC

## 2021-12-31 MED ORDER — UMECLIDINIUM-VILANTEROL 62.5-25 MCG/ACT IN AEPB
1.0000 | INHALATION_SPRAY | Freq: Every day | RESPIRATORY_TRACT | Status: DC
  Administered 2022-01-01 – 2022-01-19 (×12): 1 via RESPIRATORY_TRACT
  Filled 2021-12-31 (×3): qty 14

## 2021-12-31 MED ORDER — CHLORHEXIDINE GLUCONATE 0.12 % MT SOLN
15.0000 mL | Freq: Two times a day (BID) | OROMUCOSAL | Status: DC
  Administered 2021-12-31 – 2022-01-21 (×35): 15 mL via OROMUCOSAL
  Filled 2021-12-31 (×37): qty 15

## 2021-12-31 MED ORDER — PROCHLORPERAZINE MALEATE 5 MG PO TABS: 5.0000 mg | ORAL_TABLET | Freq: Four times a day (QID) | ORAL | Status: DC | PRN

## 2021-12-31 MED ORDER — THIAMINE HCL 100 MG PO TABS
100.0000 mg | ORAL_TABLET | Freq: Every day | ORAL | Status: DC
  Administered 2022-01-01 – 2022-01-19 (×19): 100 mg via ORAL
  Filled 2021-12-31 (×19): qty 1

## 2021-12-31 MED ORDER — SENNOSIDES-DOCUSATE SODIUM 8.6-50 MG PO TABS
1.0000 | ORAL_TABLET | Freq: Two times a day (BID) | ORAL | Status: DC
  Administered 2021-12-31 – 2022-01-21 (×35): 1 via ORAL
  Filled 2021-12-31 (×41): qty 1

## 2021-12-31 MED ORDER — PANTOPRAZOLE 2 MG/ML SUSPENSION
40.0000 mg | Freq: Every day | ORAL | Status: DC
  Administered 2022-01-02: 40 mg
  Filled 2021-12-31: qty 20

## 2021-12-31 MED ORDER — NICOTINE 21 MG/24HR TD PT24
21.0000 mg | MEDICATED_PATCH | Freq: Every day | TRANSDERMAL | Status: DC
  Administered 2022-01-01 – 2022-01-21 (×21): 21 mg via TRANSDERMAL
  Filled 2021-12-31 (×21): qty 1

## 2021-12-31 MED ORDER — ROSUVASTATIN CALCIUM 20 MG PO TABS
40.0000 mg | ORAL_TABLET | Freq: Every day | ORAL | Status: DC
  Administered 2021-12-31 – 2022-01-18 (×19): 40 mg via ORAL
  Filled 2021-12-31 (×20): qty 2

## 2021-12-31 MED ORDER — THIAMINE HCL 100 MG PO TABS: 100.0000 mg | ORAL_TABLET | Freq: Every day | ORAL | Status: DC

## 2021-12-31 MED ORDER — MELATONIN 3 MG PO TABS
3.0000 mg | ORAL_TABLET | Freq: Every day | ORAL | Status: DC
  Administered 2021-12-31 – 2022-01-20 (×21): 3 mg via ORAL
  Filled 2021-12-31 (×21): qty 1

## 2021-12-31 MED ORDER — PREDNISONE 10 MG PO TABS
10.0000 mg | ORAL_TABLET | Freq: Every day | ORAL | Status: AC
  Administered 2022-01-06 – 2022-01-08 (×3): 10 mg via ORAL
  Filled 2021-12-31 (×3): qty 1

## 2021-12-31 MED ORDER — DIPHENHYDRAMINE HCL 12.5 MG/5ML PO ELIX: 12.5000 mg | ORAL_SOLUTION | Freq: Four times a day (QID) | ORAL | Status: DC | PRN

## 2021-12-31 MED ORDER — ALUM & MAG HYDROXIDE-SIMETH 200-200-20 MG/5ML PO SUSP
30.0000 mL | ORAL | Status: DC | PRN
Start: 2021-12-31 — End: 2022-01-21

## 2021-12-31 MED ORDER — PROSOURCE TF PO LIQD
45.0000 mL | Freq: Two times a day (BID) | ORAL | Status: DC
  Administered 2021-12-31 – 2022-01-12 (×23): 45 mL
  Filled 2021-12-31 (×23): qty 45

## 2021-12-31 MED ORDER — JEVITY 1.5 CAL/FIBER PO LIQD: 1000.0000 mL | ORAL | Status: DC

## 2021-12-31 MED ORDER — METHOCARBAMOL 500 MG PO TABS
500.0000 mg | ORAL_TABLET | Freq: Four times a day (QID) | ORAL | Status: DC | PRN
  Administered 2022-01-04 – 2022-01-16 (×6): 500 mg via ORAL
  Filled 2021-12-31 (×7): qty 1

## 2021-12-31 MED ORDER — MAGNESIUM HYDROXIDE 400 MG/5ML PO SUSP: 30.0000 mL | Freq: Every day | ORAL | Status: DC | PRN

## 2021-12-31 MED ORDER — PREDNISONE 20 MG PO TABS
20.0000 mg | ORAL_TABLET | Freq: Every day | ORAL | Status: AC
  Administered 2022-01-03 – 2022-01-05 (×3): 20 mg via ORAL
  Filled 2021-12-31 (×3): qty 1

## 2021-12-31 MED ORDER — IPRATROPIUM-ALBUTEROL 0.5-2.5 (3) MG/3ML IN SOLN
3.0000 mL | RESPIRATORY_TRACT | Status: DC | PRN
  Administered 2021-12-31 – 2022-01-02 (×6): 3 mL via RESPIRATORY_TRACT
  Filled 2021-12-31 (×7): qty 3

## 2021-12-31 MED ORDER — JEVITY 1.5 CAL/FIBER PO LIQD
1000.0000 mL | ORAL | Status: AC
  Administered 2021-12-31 – 2022-01-01 (×2): 1000 mL
  Filled 2021-12-31 (×2): qty 1000

## 2021-12-31 MED ORDER — BISACODYL 10 MG RE SUPP
10.0000 mg | Freq: Every day | RECTAL | Status: DC | PRN
Start: 2021-12-31 — End: 2022-01-21
  Administered 2022-01-04: 10 mg via RECTAL
  Filled 2021-12-31: qty 1

## 2021-12-31 MED ORDER — AMLODIPINE BESYLATE 10 MG PO TABS
10.0000 mg | ORAL_TABLET | Freq: Every day | ORAL | Status: DC
  Administered 2022-01-01 – 2022-01-11 (×11): 10 mg via ORAL
  Filled 2021-12-31 (×11): qty 1

## 2021-12-31 MED ORDER — PREDNISONE 10 MG PO TABS
5.0000 mg | ORAL_TABLET | Freq: Every day | ORAL | Status: AC
  Administered 2022-01-09 – 2022-01-11 (×3): 5 mg via ORAL
  Filled 2021-12-31 (×3): qty 1

## 2021-12-31 MED ORDER — PREDNISONE 20 MG PO TABS
30.0000 mg | ORAL_TABLET | Freq: Every day | ORAL | Status: AC
  Administered 2022-01-01 – 2022-01-02 (×2): 30 mg via ORAL
  Filled 2021-12-31 (×2): qty 1

## 2021-12-31 MED ORDER — INSULIN ASPART 100 UNIT/ML IJ SOLN
0.0000 [IU] | Freq: Three times a day (TID) | INTRAMUSCULAR | Status: DC
  Administered 2022-01-01 – 2022-01-02 (×3): 1 [IU] via SUBCUTANEOUS
  Administered 2022-01-02: 2 [IU] via SUBCUTANEOUS
  Administered 2022-01-02: 3 [IU] via SUBCUTANEOUS
  Administered 2022-01-03: 2 [IU] via SUBCUTANEOUS
  Administered 2022-01-03 (×2): 1 [IU] via SUBCUTANEOUS
  Administered 2022-01-04 – 2022-01-05 (×3): 2 [IU] via SUBCUTANEOUS
  Administered 2022-01-05: 1 [IU] via SUBCUTANEOUS
  Administered 2022-01-05: 2 [IU] via SUBCUTANEOUS
  Administered 2022-01-06: 1 [IU] via SUBCUTANEOUS
  Administered 2022-01-06: 2 [IU] via SUBCUTANEOUS
  Administered 2022-01-06: 1 [IU] via SUBCUTANEOUS
  Administered 2022-01-07: 2 [IU] via SUBCUTANEOUS
  Administered 2022-01-07 – 2022-01-08 (×4): 1 [IU] via SUBCUTANEOUS
  Administered 2022-01-08: 3 [IU] via SUBCUTANEOUS
  Administered 2022-01-09: 1 [IU] via SUBCUTANEOUS
  Administered 2022-01-09: 3 [IU] via SUBCUTANEOUS
  Administered 2022-01-09 – 2022-01-10 (×2): 1 [IU] via SUBCUTANEOUS
  Administered 2022-01-10: 3 [IU] via SUBCUTANEOUS
  Administered 2022-01-10: 1 [IU] via SUBCUTANEOUS
  Administered 2022-01-11: 2 [IU] via SUBCUTANEOUS
  Administered 2022-01-11 (×2): 1 [IU] via SUBCUTANEOUS
  Administered 2022-01-12 (×2): 2 [IU] via SUBCUTANEOUS
  Administered 2022-01-12: 1 [IU] via SUBCUTANEOUS
  Administered 2022-01-13 (×3): 2 [IU] via SUBCUTANEOUS
  Administered 2022-01-14: 3 [IU] via SUBCUTANEOUS
  Administered 2022-01-14 – 2022-01-15 (×3): 2 [IU] via SUBCUTANEOUS
  Administered 2022-01-15: 1 [IU] via SUBCUTANEOUS
  Administered 2022-01-15: 2 [IU] via SUBCUTANEOUS
  Administered 2022-01-16: 3 [IU] via SUBCUTANEOUS
  Administered 2022-01-16: 2 [IU] via SUBCUTANEOUS
  Administered 2022-01-16: 1 [IU] via SUBCUTANEOUS
  Administered 2022-01-17 (×2): 2 [IU] via SUBCUTANEOUS
  Administered 2022-01-17: 3 [IU] via SUBCUTANEOUS
  Administered 2022-01-18 (×2): 2 [IU] via SUBCUTANEOUS
  Administered 2022-01-18 – 2022-01-19 (×2): 3 [IU] via SUBCUTANEOUS
  Administered 2022-01-19: 1 [IU] via SUBCUTANEOUS
  Administered 2022-01-19: 2 [IU] via SUBCUTANEOUS
  Administered 2022-01-20: 5 [IU] via SUBCUTANEOUS
  Administered 2022-01-20: 2 [IU] via SUBCUTANEOUS
  Administered 2022-01-20: 3 [IU] via SUBCUTANEOUS
  Administered 2022-01-21: 2 [IU] via SUBCUTANEOUS

## 2021-12-31 MED ORDER — ATENOLOL 25 MG PO TABS
50.0000 mg | ORAL_TABLET | Freq: Every day | ORAL | Status: DC
  Administered 2022-01-01 – 2022-01-21 (×21): 50 mg via ORAL
  Filled 2021-12-31 (×21): qty 2

## 2021-12-31 MED ORDER — HYDRALAZINE HCL 50 MG PO TABS
100.0000 mg | ORAL_TABLET | Freq: Three times a day (TID) | ORAL | Status: DC
Start: 2021-12-31 — End: 2022-01-13
  Administered 2021-12-31 – 2022-01-13 (×38): 100 mg via ORAL
  Filled 2021-12-31 (×38): qty 2

## 2021-12-31 MED ORDER — PROCHLORPERAZINE EDISYLATE 10 MG/2ML IJ SOLN: 5.0000 mg | Freq: Four times a day (QID) | INTRAMUSCULAR | Status: DC | PRN

## 2021-12-31 MED ORDER — VITAMIN D (ERGOCALCIFEROL) 1.25 MG (50000 UNIT) PO CAPS
50000.0000 [IU] | ORAL_CAPSULE | ORAL | Status: DC
  Administered 2022-01-01 – 2022-01-15 (×3): 50000 [IU] via ORAL
  Filled 2021-12-31 (×5): qty 1

## 2021-12-31 MED ORDER — GUAIFENESIN-DM 100-10 MG/5ML PO SYRP
5.0000 mL | ORAL_SOLUTION | Freq: Four times a day (QID) | ORAL | Status: DC | PRN
  Administered 2022-01-10: 10 mL via ORAL
  Filled 2021-12-31: qty 10

## 2021-12-31 MED ORDER — PROSOURCE TF PO LIQD: 45.0000 mL | Freq: Two times a day (BID) | ORAL | Status: DC

## 2021-12-31 NOTE — Progress Notes (Signed)
Courtney Heys, MD  Physician Physical Medicine and Rehabilitation PMR Pre-admission    Signed Date of Service:  12/23/2021 12:59 PM  Related encounter: ED to Hosp-Admission (Current) from 12/21/2021 in [facility]   Signed                                                                                                                                                                                                                                                                                                                                                                                                                                                         PMR Admission Coordinator Pre-Admission Assessment   Patient: Ronnie Jones is an 71 y.o., male MRN: [id number] DOB: Jan 05, 1951 Height: 5' 7"  (170.2 cm) Weight: 77.1 kg   Insurance Information HMO:  yes   PPO:      PCP:      IPA:      80/20:      OTHER:  PRIMARY: Bernadene Person      Policy#: [id number]      Subscriber:Pt CM Name: Dewitt Rota      Phone#: [phone]     Fax#: [phone] Pre-Cert#: [id number]   approved for 7 days from 12/30/21 with f/u due on 01/06/22   Employer:  Benefits:  Phone #: [phone]  Name: 5/22 Eff. Date: 08/09/21     Deduct: none      Out of Pocket Max: $4500      Life Max: none CIR: $295 co pay per day days 1 until 6      SNF: no copay days 1 until 20; $196 co pay per day days 21 until 100 Outpatient: $35 per visit     Co-Pay: visits per medical neccesity Home Health: 10%      Co-Pay: visits per medical neccesity DME: 80%     Co-Pay: 20% Providers: in network  SECONDARY: none   Financial Counselor:       Phone#:    The Engineer, petroleum" for patients in Inpatient Rehabilitation Facilities with attached "Privacy  Act [place] Records" was provided and verbally reviewed with:    Emergency Contact Information Contact Information       Name Relation Home Work [employer] Spouse [phone]   [phone]         Current Medical History  Patient Admitting Diagnosis: ICH   History of Present Illness: A 71 year old gentleman with a past medical history of ETOH use (at least 1 pint per day), COPD, HTN, tobacco use (2-3 packs per day), DM,and vitamin D deficiency who presented to [facility] ED 12/21/21. He presented via EMS as a Code Stroke after he was found at home with right sided weakness, sitting in his chair, by his wife.     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 Mild surrounding edema and local mass effect without significant midline shift. 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 Bilateral carotid bifurcation atherosclerosis without greater than 50% stenosis. MRI not able to be performed due to respiratory issues.  Reportedly taking ASA daily  now on none due to [place].  LDL 192 to consider Crestor at discharge. Type 2 DM with Hgb A1c 6.1 on Metformin. SLP eval and placed on Dysphagia 3 diet. MBS pending. Current smoker with nicotine patch provided. Heavy ETOH abuse with CIWA protocol activated with Ativan. Heparin for DVT prophylaxis. HTN to continue on Atenolol and hydralazine and amlodipine.  Echo without cardiogenic source. Carotids normal. COPD exacerbation with increase of solumedrol, continue Anoro, increase ipratropium and continue PPI. Placed on Unasyn for aspiration pneumonia.    Complete NIHSS TOTAL: 13   Patient's medical record from [facility] has been reviewed by the rehabilitation admission coordinator and physician.   Past Medical History      Past Medical History:  Diagnosis Date   CKD  (chronic kidney disease)     COPD (chronic obstructive pulmonary disease) (HCC)     Diabetes ([place])     Hypertension     Nodule of right lung     Tobacco abuse      Has the patient had major surgery during 100 days prior to admission? No   Family History   family history is not on file.   Current Medications   Current Facility-Administered Medications:    acetaminophen (TYLENOL) tablet 650 mg, 650 mg, Oral, Q4H PRN, 650 mg at 12/29/21 0839 **OR** acetaminophen (TYLENOL) 160 MG/5ML solution 650 mg, 650 mg, Per Tube, Q4H PRN **OR** acetaminophen (TYLENOL) suppository 650 mg, 650 mg, Rectal, Q4H PRN, Bartholomew Crews, Kristi, PA-C   amLODipine (NORVASC) tablet 10 mg, 10 mg, Oral, Daily, Janine Ores, NP, 10  mg at 12/30/21 3254   ascorbic acid (VITAMIN C) tablet 500 mg, 500 mg, Oral, Daily, Danford, Suann Larry, MD, 500 mg at 12/30/21 0912   atenolol (TENORMIN) tablet 50 mg, 50 mg, Oral, Daily, Rosalin Hawking, MD, 50 mg at 12/30/21 0911   chlorhexidine (PERIDEX) 0.12 % solution 15 mL, 15 mL, Mouth Rinse, BID, Amie Portland, MD, 15 mL at 12/30/21 2109   Chlorhexidine Gluconate Cloth 2 % PADS 6 each, 6 each, Topical, Daily, Kerney Elbe, MD, 6 each at 12/26/21 9826   feeding supplement (JEVITY 1.5 CAL/FIBER) liquid 1,000 mL, 1,000 mL, Per Tube, Continuous, Samtani, Jai-Gurmukh, MD, Last Rate: 25 mL/hr at 12/30/21 1724, 1,000 mL at 12/30/21 1724   feeding supplement (PROSource TF) liquid 45 mL, 45 mL, Per Tube, BID, Verlon Au, Jai-Gurmukh, MD, 45 mL at 41/58/30 9407   folic acid (FOLVITE) tablet 1 mg, 1 mg, Oral, Daily, Godfrey Pick, MD, 1 mg at 12/30/21 0912   free water 75 mL, 75 mL, Per Tube, Q4H, Samtani, Jai-Gurmukh, MD, 75 mL at 12/31/21 0402   heparin injection 5,000 Units, 5,000 Units, Subcutaneous, Q8H, Julian Hy, DO, 5,000 Units at 12/31/21 6808   hydrALAZINE (APRESOLINE) tablet 100 mg, 100 mg, Oral, Q8H, Rosalin Hawking, MD, 100 mg at 12/31/21 8110   insulin aspart (novoLOG) injection 0-15 Units,  0-15 Units, Subcutaneous, TID WC, Danford, Suann Larry, MD, 5 Units at 12/30/21 1724   insulin aspart (novoLOG) injection 0-5 Units, 0-5 Units, Subcutaneous, QHS, Danford, Christopher P, MD   ipratropium-albuterol (DUONEB) 0.5-2.5 (3) MG/3ML nebulizer solution 3 mL, 3 mL, Nebulization, Q2H PRN, Danford, Suann Larry, MD, 3 mL at 12/30/21 2148   labetalol (NORMODYNE) injection 10-20 mg, 10-20 mg, Intravenous, Q10 min PRN, Rosalin Hawking, MD, 20 mg at 12/28/21 0030   LORazepam (ATIVAN) injection 1 mg, 1 mg, Intravenous, Q4H PRN, Julian Hy, DO   MEDLINE mouth rinse, 15 mL, Mouth Rinse, q12n4p, Amie Portland, MD, 15 mL at 12/30/21 1700   melatonin tablet 3 mg, 3 mg, Oral, QHS, Danford, Christopher P, MD, 3 mg at 12/30/21 2110   nicotine (NICODERM CQ - dosed in mg/24 hours) patch 21 mg, 21 mg, Transdermal, Daily, Kehoe, Kristi, PA-C, 21 mg at 12/30/21 0912   pantoprazole (PROTONIX) EC tablet 40 mg, 40 mg, Oral, Daily, Rosalin Hawking, MD, 40 mg at 12/30/21 0912   predniSONE (DELTASONE) tablet 30 mg, 30 mg, Oral, Q breakfast, Danford, Suann Larry, MD, 30 mg at 12/30/21 0911   rosuvastatin (CRESTOR) tablet 40 mg, 40 mg, Oral, QHS, Danford, Christopher P, MD, 40 mg at 12/30/21 2110   senna-docusate (Senokot-S) tablet 1 tablet, 1 tablet, Oral, BID, Kehoe, Kristi, PA-C, 1 tablet at 12/30/21 2110   sodium chloride flush (NS) 0.9 % injection 3 mL, 3 mL, Intravenous, Once, Kerney Elbe, MD   thiamine tablet 100 mg, 100 mg, Oral, Daily, Noemi Chapel P, DO, 100 mg at 12/30/21 3159   umeclidinium-vilanterol (ANORO ELLIPTA) 62.5-25 MCG/ACT 1 puff, 1 puff, Inhalation, Daily, Julian Hy, DO, 1 puff at 12/31/21 0755   Vitamin D (Ergocalciferol) (DRISDOL) capsule 50,000 Units, 50,000 Units, Oral, Q7 days, Julian Hy, DO   Patients Current Diet:  Diet Order                  Diet - low sodium heart healthy             DIET - DYS 1 Room service appropriate? No; Fluid consistency: Honey Thick  Diet  effective now  Precautions / Restrictions Precautions Precautions: Fall Precaution Comments: R hemiparesis Restrictions Weight Bearing Restrictions: No    Has the patient had 2 or more falls or a fall with injury in the past year? No   Prior Activity Level Community (5-7x/wk): Pt. active in the community PTA   Prior Functional Level Self Care: Did the patient need help bathing, dressing, using the toilet or eating? Independent   Indoor Mobility: Did the patient need assistance with walking from room to room (with or without device)? Independent   Stairs: Did the patient need assistance with internal or external stairs (with or without device)? Independent   Functional Cognition: Did the patient need help planning regular tasks such as shopping or remembering to take medications? Independent   Patient Information Are you of Hispanic, Latino/a,or Spanish origin?: A. No, not of Hispanic, Latino/a, or Spanish origin What is your race?: A. White Do you need or want an interpreter to communicate with a doctor or health care staff?: 0. No   Patient's Response To:  Health Literacy and Transportation Is the patient able to respond to health literacy and transportation needs?: No   Development worker, international aid / [place] Devices/Equipment: None Home Equipment: Wheelchair - manual, Conservation officer, nature (2 wheels)   Prior Device Use: Indicate devices/aids used by the patient prior to current illness, exacerbation or injury? None of the above   Current Functional Level Cognition   Arousal/Alertness: Awake/alert Overall Cognitive Status: Impaired/Different from baseline Current Attention Level: Sustained Orientation Level: Oriented X4 Following Commands: Follows one step commands with increased time Safety/Judgement: Decreased awareness of safety, Decreased awareness of deficits General Comments: pt remains with flat affect and reports "I feel like  shit". Pt gives good  effort during therapy Attention: Focused, Sustained Focused Attention: Impaired Sustained Attention: Impaired Memory: Impaired Memory Impairment: Decreased short term memory Problem Solving: Impaired Problem Solving Impairment: Verbal basic    Extremity Assessment (includes Sensation/Coordination)   Upper Extremity Assessment: RUE deficits/detail RUE Deficits / Details: flaccid RUE Sensation: decreased light touch, decreased proprioception RUE Coordination: decreased fine motor, decreased gross motor  Lower Extremity Assessment: Defer to PT evaluation RLE Deficits / Details: hip flex 1+/5, knee ext 1+/5 RLE Sensation: decreased proprioception RLE Coordination: decreased fine motor, decreased gross motor     ADLs   Overall ADL's : Needs assistance/impaired Eating/Feeding: Minimal assistance Eating/Feeding Details (indicate cue type and reason): requires cups items stable, can use LUE to self feed Grooming: Minimal assistance Grooming Details (indicate cue type and reason): rubbing lotion on RUE with LUE Upper Body Bathing: Supervision/ safety, Sitting Upper Body Bathing Details (indicate cue type and reason): simulated, use of LUE to wash RUE Lower Body Bathing: Maximal assistance, +2 for physical assistance, +2 for safety/equipment Upper Body Dressing : Moderate assistance, Sitting Lower Body Dressing: Maximal assistance, +2 for physical assistance, +2 for safety/equipment Toilet Transfer: Moderate assistance, +2 for physical assistance Toilet Transfer Details (indicate cue type and reason): simulated to chair Toileting- Clothing Manipulation and Hygiene: Total assistance Functional mobility during ADLs: Maximal assistance, +2 for physical assistance, +2 for safety/equipment General ADL Comments: R hemi, poor sitting balance, difficulty breathing, impaired cognition     Mobility   Overal bed mobility: Needs Assistance Bed Mobility: Rolling, Sidelying to  Sit Rolling: Min assist Sidelying to sit: Mod assist, HOB elevated Supine to sit: Mod assist, HOB elevated Sit to supine: Mod assist, HOB elevated Sit to sidelying: Mod assist General bed mobility comments: directional verbal cues, assist for R LE  management off EOB, modA for trunk elevation, pt with solid effort in trying to pull self up with L UE     Transfers   Overall transfer level: Needs assistance Equipment used: 2 person hand held assist Transfers: Sit to/from Stand, Bed to chair/wheelchair/BSC Sit to Stand: Mod assist, +2 physical assistance Bed to/from chair/wheelchair/BSC transfer type:: Step pivot General transfer comment: pt modAx2 with use of bed pad to power up with R knee blocked, maxA to complete step to transfer to chair, maxA for R LE advancement, requires blocking of R knee to advance L LE     Ambulation / Gait / Stairs / Wheelchair Mobility   Ambulation/Gait Ambulation/Gait assistance: Max Web designer (Feet): 2 Feet Assistive device: 1 person hand held assist Gait Pattern/deviations: Shuffle General Gait Details: x1 sidestep toward [facility] but unstable, unsafe to progress further with only +1 assist Gait velocity interpretation: <1.31 ft/sec, indicative of household ambulator     Posture / Balance Dynamic Sitting Balance Sitting balance - Comments: cannot reach outside BOS Balance Overall balance assessment: Needs assistance Sitting-balance support: Single extremity supported, Feet supported Sitting balance-Leahy Scale: Poor Sitting balance - Comments: cannot reach outside BOS Postural control: Right lateral lean Standing balance support: Single extremity supported Standing balance-Leahy Scale: Poor Standing balance comment: worked on standing at EOB and trying to lift up L LE to promote increased WBing through R LE with R knee blocked, pt reports "I can't feel a thing" , worked again in standing at sink with PT focusing on R Hip and knee kinematics for  optimal WBing potential while OT worked with UEs functionally     Special needs/care consideration CIWa protocol Smoker Hgb A1c 6.1    Previous Home Environment  Living Arrangements: Spouse/significant other  Lives With: Spouse Available Help at Discharge: Available 24 hours/day Type of Home: House Home Layout: One level Home Access: Stairs to enter Entrance Stairs-Rails: Right, Left, Can reach both Entrance Stairs-Number of Steps: 2 Bathroom Shower/Tub: Tub/shower unit, Multimedia programmer: Standard Bathroom Accessibility: Yes How Accessible: Accessible via walker Home Care Services: No   Discharge Living Setting Plans for Discharge Living Setting: Patient's home Type of Home at Discharge: House Discharge Home Layout: One level Discharge Home Access: Level entry Discharge Bathroom Shower/Tub: Tub/shower unit Discharge Bathroom Toilet: Standard Discharge Bathroom Accessibility: Yes How Accessible: Accessible via walker Does the patient have any problems obtaining your medications?: No   Social/Family/Support Systems Patient Roles: Spouse Contact Information: [phone] Anticipated Caregiver: patricia mccal (wife Ability/Limitations of Caregiver: Can provide mod A Caregiver Availability: 24/7 Discharge Plan Discussed with Primary Caregiver: Yes Is Caregiver In Agreement with Plan?: Yes Does Caregiver/Family have Issues with Lodging/Transportation while Pt is in Rehab?: No   Goals Patient/Family Goal for Rehab: PT/OT/SLP Mod A Expected length of stay: 18-21 days Pt/Family Agrees to Admission and willing to participate: Yes Program Orientation Provided & Reviewed with Pt/Caregiver Including Roles  & Responsibilities: Yes   Decrease burden of Care through IP rehab admission: n/a   Possible need for SNF placement upon discharge: not anticipated    Patient Condition: I have reviewed medical records from [facility], spoken with CM, and  patient and spouse. I met with patient at the bedside for inpatient rehabilitation assessment.  Patient will benefit from ongoing PT, OT, and SLP, can actively participate in 3 hours of therapy a day 5 days of the week, and can make measurable gains during the admission.  Patient will also benefit from the  coordinated team approach during an Inpatient Acute Rehabilitation admission.  The patient will receive intensive therapy as well as Rehabilitation physician, nursing, social worker, and care management interventions.  Due to safety, skin/wound care, disease management, medication administration, pain management, and patient education the patient requires 24 hour a day rehabilitation nursing.  The patient is currently mod to max assist with mobility and basic ADLs.  Discharge setting and therapy post discharge at home with home health is anticipated.  Patient has agreed to participate in the Acute Inpatient Rehabilitation Program and will admit today.   Preadmission Screen Completed [id number] Boyette RN MSN and updated by  Retta Diones, 12/31/2021 10:30 AM ______________________________________________________________________   Discussed status with Dr. Dagoberto Ligas on 12/31/21 at 10:31am and received approval for admission today.   Admission Coordinator: Danne Baxter RN MSN with updates by  Retta Diones, RN, time 10:32 am/Date 12/31/21    Assessment/Plan: Diagnosis: Does the need for close, 24 hr/day Medical supervision in concert with the patient's rehab needs make it unreasonable for this patient to be served in a less intensive setting? Yes Co-Morbidities requiring supervision/potential complications: Severe COPD- smokes 2-3 ppd; heavy EtOH use s/p CIWA- R heimplegia; L IPH; dysphagia; aspiration pneumonia Due to bladder management, bowel management, safety, skin/wound care, disease management, medication administration, pain management, and patient education, does the patient require 24 hr/day  rehab nursing? Yes Does the patient require coordinated care of a physician, rehab nurse, PT, OT, and SLP to address physical and functional deficits in the context of the above medical diagnosis(es)? Yes Addressing deficits in the following areas: balance, endurance, locomotion, strength, transferring, bowel/bladder control, bathing, dressing, feeding, grooming, toileting, cognition, speech, language, and swallowing Can the patient actively participate in an intensive therapy program of at least 3 hrs of therapy 5 days a week? Yes The potential for patient to make measurable gains while on inpatient rehab is good and fair Anticipated functional outcomes upon discharge from inpatient rehab: min assist PT, min assist OT, min assist SLP Estimated rehab length of stay to reach the above functional goals is: 18-21 days Anticipated discharge destination: Home 10. Overall Rehab/Functional Prognosis: good and fair     MD Signature:           Revision History                                         Note Details  Jan Fireman, MD File Time 12/31/2021 10:48 AM  Author Type Physician Status Signed  Last Editor Courtney Heys, MD Service Physical Medicine and Rehabilitation

## 2021-12-31 NOTE — Plan of Care (Signed)
  Problem: Education: Goal: Knowledge of disease or condition will improve Outcome: Progressing Goal: Knowledge of secondary prevention will improve (SELECT ALL) Outcome: Progressing Goal: Knowledge of patient specific risk factors will improve (INDIVIDUALIZE FOR PATIENT) Outcome: Progressing Goal: Individualized Educational Video(s) Outcome: Progressing   Problem: Coping: Goal: Will verbalize positive feelings about self Outcome: Progressing Goal: Will identify appropriate support needs Outcome: Progressing   Problem: Health Behavior/Discharge Planning: Goal: Ability to manage health-related needs will improve Outcome: Progressing   Problem: Self-Care: Goal: Ability to participate in self-care as condition permits will improve Outcome: Progressing Goal: Verbalization of feelings and concerns over difficulty with self-care will improve Outcome: Progressing Goal: Ability to communicate needs accurately will improve Outcome: Progressing   Problem: Nutrition: Goal: Risk of aspiration will decrease Outcome: Progressing Goal: Dietary intake will improve Outcome: Progressing   Problem: Intracerebral Hemorrhage Tissue Perfusion: Goal: Complications of Intracerebral Hemorrhage will be minimized Outcome: Progressing   

## 2021-12-31 NOTE — Progress Notes (Signed)
Physical Therapy Treatment Patient Details Name: Ronnie Jones MRN: [id number] DOB: October 31, 1950 Today's Date: 12/31/2021   History of Present Illness Ronnie Jones is a 71 year old gentleman who presented via EMS as a Code Stroke after he was found at home with right sided weakness, sitting in his chair, by his wife. CT showed L subcortical ICH. PMH:  ETOH use (at least 1 pint per day), COPD, HTN, tobacco use (2-3 packs per day), and vitamin D deficiency.    PT Comments    Pt progressing slowly toward goals, limited somewhat by more dense hemiplegia on the right with severely diminished sensation.  Emphasis on transition to EOB, sitting balance, trials of sit to stand with work in standing on w/shifting, balance and full upright stance.    Recommendations for follow up therapy are one component of a multi-disciplinary discharge planning process, led by the attending physician.  Recommendations may be updated based on patient status, additional functional criteria and insurance authorization.  Follow Up Recommendations  Acute inpatient rehab (3hours/day)     Assistance Recommended at Discharge Frequent or constant Supervision/Assistance  Patient can return home with the following Two people to help with walking and/or transfers;Two people to help with bathing/dressing/bathroom;Assistance with feeding;Assistance with cooking/housework;Direct supervision/assist for medications management;Direct supervision/assist for financial management;Assist for transportation;Help with stairs or ramp for entrance   Equipment Recommendations  Wheelchair (measurements PT);Wheelchair cushion (measurements PT);BSC/3in1;Other (comment)    Recommendations for Other Services Rehab consult     Precautions / Restrictions Precautions Precautions: Fall Precaution Comments: R hemiparesis     Mobility  Bed Mobility Overal bed mobility: Needs Assistance Bed Mobility: Supine to Sit, Sit to Supine     Supine to  sit: Mod assist, +2 for physical assistance Sit to supine: Mod assist, +2 for physical assistance   General bed mobility comments: cues for sequencing assist of LE's and truncal assist up via weaker R elbow.    Transfers Overall transfer level: Needs assistance Equipment used:  (chair back for standing support) Transfers: Sit to/from Stand Sit to Stand: Mod assist, Max assist, +2 physical assistance           General transfer comment: mod assist of 2 with R knee block to come into a flexed stance  with L hand holding R UE up on the chair back.  Max assist to facility full upright stance at times.   x3 reps    Ambulation/Gait                   Stairs             Wheelchair Mobility    Modified Rankin (Stroke Patients Only) Modified Rankin (Stroke Patients Only) Pre-Morbid Rankin Score: No symptoms Modified Rankin: Severe disability     Balance Overall balance assessment: Needs assistance Sitting-balance support: Single extremity supported, Feet supported Sitting balance-Leahy Scale: Poor Sitting balance - Comments: short peiods of sitting within BOS without UE assist, but generally needing L UE to keep from listing right.   Standing balance support: Single extremity supported Standing balance-Leahy Scale: Poor Standing balance comment: 3 trials of standing with work on w/shift, full upright stance, general balance, correcting stance.                            Cognition Arousal/Alertness: Awake/alert Behavior During Therapy: Flat affect Overall Cognitive Status: Impaired/Different from baseline (NT formally)  General Comments: I don't feel good at all.        Exercises      General Comments        Pertinent Vitals/Pain Pain Assessment Pain Assessment: No/denies pain Pain Intervention(s): Monitored during session    Home Living                          Prior Function             PT Goals (current goals can now be found in the care plan section) Acute Rehab PT Goals Patient Stated Goal: return home PT Goal Formulation: With patient Time For Goal Achievement: 01/05/22 Potential to Achieve Goals: Good Progress towards PT goals: Progressing toward goals    Frequency    Min 4X/week      PT Plan Current plan remains appropriate    Co-evaluation              AM-PAC PT "6 Clicks" Mobility   Outcome Measure  Help needed turning from your back to your side while in a flat bed without using bedrails?: A Lot Help needed moving from lying on your back to sitting on the side of a flat bed without using bedrails?: A Lot Help needed moving to and from a bed to a chair (including a wheelchair)?: A Lot Help needed standing up from a chair using your arms (e.g., wheelchair or bedside chair)?: A Lot Help needed to walk in hospital room?: Total Help needed climbing 3-5 steps with a railing? : Total 6 Click Score: 10    End of Session Equipment Utilized During Treatment: Gait belt Activity Tolerance: Patient tolerated treatment well Patient left: in bed;with call bell/phone within reach;with bed alarm set;with family/visitor present Nurse Communication: Mobility status PT Visit Diagnosis: Unsteadiness on feet (R26.81);Hemiplegia and hemiparesis;Difficulty in walking, not elsewhere classified (R26.2) Hemiplegia - Right/Left: Right Hemiplegia - dominant/non-dominant: Dominant Hemiplegia - caused by: Nontraumatic SAH     Time: 4098-1191 PT Time Calculation (min) (ACUTE ONLY): 30 min  Charges:  $Therapeutic Activity: 8-22 mins $Neuromuscular Re-education: 8-22 mins                     12/31/2021  Jacinto Halim., PT Acute Rehabilitation Services [phone]  (pager) [phone]  (office)   Ronnie Jones 12/31/2021, 5:34 PM

## 2021-12-31 NOTE — TOC Transition Note (Signed)
Transition of Care [facility]) - CM/SW Discharge Note   Patient Details  Name: Ronnie Jones MRN: [id number] Date of Birth: 1950-11-03  [facility] CM/SW Contact:  Kermit Balo, RN Phone Number: 12/31/2021, 10:52 AM   Clinical Narrative:    Patient discharging to CIR today. CM signing off.    Final next level of care: IP Rehab Facility Barriers to Discharge: No Barriers Identified   Patient Goals and CMS Choice     Choice offered to / list presented to : Patient  Discharge Placement                       Discharge Plan and Services                                     Social Determinants of Health (SDOH) Interventions     Readmission Risk Interventions     View : No data to display.

## 2021-12-31 NOTE — Progress Notes (Signed)
Inpatient Rehabilitation Admission Medication Review by a Pharmacist  A complete drug regimen review was completed for this patient to identify any potential clinically significant medication issues.  High Risk Drug Classes Is patient taking? Indication by Medication  Antipsychotic Yes Compazine- N/V  Anticoagulant Yes Lovenox- VTE prophylaxis  Antibiotic No   Opioid No   Antiplatelet No   Hypoglycemics/insulin Yes Insulin- T2DM  Vasoactive Medication Yes Norvasc, atenolol, hydralazine- hypertension  Chemotherapy No   Other Yes Melatonin, trazodone- sleep Protonix- GERD Crestor- HLD Anoro Ellipta- COPD     Type of Medication Issue Identified Description of Issue Recommendation(s)  Drug Interaction(s) (clinically significant)     Duplicate Therapy     Allergy     No Medication Administration End Date     Incorrect Dose     Additional Drug Therapy Needed     Significant med changes from prior encounter (inform family/care partners about these prior to discharge).    Other  PTA meds: Albuterol HFA 2 puffs q4h prn SOB / wheezing aspirin 812 mg daily lisinopril 20 mg daily metformin 500 mg bid Restart PTA meds when and if necessary during CIR admission or at time of discharge, if warranted  Off aspirin at this point in time 2/2 ICH    Clinically significant medication issues were identified that warrant physician communication and completion of prescribed/recommended actions by midnight of the next day:  No   Time spent performing this drug regimen review (minutes):  30   Santez Woodcox BS, PharmD, BCPS Clinical Pharmacist 01/01/2022 7:05 AM  Contact: [phone] after 3 PM  "Be curious, not judgmental..." -Jamal Maes

## 2021-12-31 NOTE — Discharge Summary (Signed)
Physician Discharge Summary  Lexon Behlen HTM:[id number] DOB: [birth year] DOA: 12/21/2021  PCP: [facility]  Admit date: 12/21/2021 Discharge date: 12/31/2021  Time spent: 37 minutes  Recommendations for Outpatient Follow-up:  recommend conference with SLP/PT/OT at CIR to ensure can graduate to regular diet soon Keep core track in place with feeds Tapering doses of steroids as per [facility] Consider bicarb addition if labs are still showing reequilibration of his NAGMA Please ensure that he has a lung CT in 3 months post CIR discharge  Discharge Diagnoses:  MAIN problem for hospitalization   Intracranial hemorrhage  Please see below for itemized issues addressed in HOpsital- refer to other progress notes for clarity if needed  Discharge Condition: Improved  Diet recommendation: Dysphagia diet in addition to core track feeds  Filed Weights   12/21/21 1700 12/21/21 1833  Weight: 77.4 kg 77.1 kg    71 y.o. M with DM, HTN, COPD FEV1 46% by PFT, 23% by spirometry in 2019, alcohol dependence (1 pint per day), 2-3 PPD smoker + lung nodules   5/15 wife found him at home in a chair, right sided weakness, right facial droop.     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, reported taking 10 x low-dose aspirin (812 mg) per day--- found to have subcortical ICH therefore TNK not administered-initial NIH 9--- antiplatelet therapy initially held 5/17: developed respiratory distress, PCCM consulted, placed on BiPAP, started on steroids and antibiotics, repeat CTH no change 5/18: Off BiPAP--Copper B12 vitamin A vitamin C ordered[?] 5/19: Nicardipine d/c'd, Cr slightly better with IV fluids 5/21: Phenobarb completed, respiratory status improving now, transitioned to PO steroids 5/22-5/23: Stable, pending CIR   Assessment and Plan:   Intraparenchymal hemorrhage on admission 5/15--repeat scan 5/21 normal evolution Hypertensive urgency  on admission Supportive management, Crestor 40--poor candidacy antiplatelet given hemorrhage Stringent diet control  goal blood pressures are below 160--meds adjusted during hospital stay-May need as needed's if goes above 160 Core track was placed 5/24 and he will continue to get feet He will need intensive therapies with speech PT OT because of his dense right-sided plegia and inability to eat Suspected aspiration pneumonia on admission with acute respiratory failure and hypoxia Likely exacerbation COPD on admission Completed 5 days antibiotics while in ICU Previously on BiPAP now liberated Continue prednisone 30 daily with taper as per below Continue dysphagia diet given dense paresis AKI likely secondary to IPH physiology and endorgan damage on admission [? HTN crisis] NAGMA on admit AKI Resolved-keep volumes even Acidosis still re-equilibrating--might consider bicarb if needed Alcohol abuse disorder Had complicated withdrawal-was placed on phenobarbital-he is not having DTs now-I have discontinued his Ativan and CIWA protocol Smoker solid lung nodule 6 mm Outpatient CT scan in 3 months   Discharge Exam: Vitals:   12/31/21 0731 12/31/21 0756  BP: (!) 152/78   Pulse: 79 79  Resp: 17 18  Temp: 97.8 F (36.6 C)   SpO2: 97% 97%    Subj on day of d/c   Awake coherent no distress significant plegia still exists Tolerating some diet core track was placed yesterday Does not feel " great"  General Exam on discharge  EOMI NCAT Flattening of right nasolabial fold ROM intact left upper and lower extremity dense plegia with swelling of both upper and lower extremities on the right side Babinski downgoing No rash Chest clear  Discharge Instructions   Discharge Instructions     Diet - low sodium heart healthy  Complete by: As directed    Increase activity slowly   Complete by: As directed       Allergies as of 12/31/2021       Reactions   Codeine Nausea Only         Medication List     STOP taking these medications    aspirin 325 MG tablet   aspirin EC 81 MG tablet   lisinopril 20 MG tablet Commonly known as: ZESTRIL   Vitamin D (Ergocalciferol) 1.25 MG (50000 UNIT) Caps capsule Commonly known as: DRISDOL       TAKE these medications    acetaminophen 500 MG tablet Commonly known as: TYLENOL Take 500 mg by mouth every 6 (six) hours as needed for mild pain or fever.   albuterol 108 (90 Base) MCG/ACT inhaler Commonly known as: VENTOLIN HFA Inhale 2 puffs into the lungs every 4 (four) hours as needed.   amLODipine 10 MG tablet Commonly known as: NORVASC Take 10 mg by mouth daily.   atenolol 50 MG tablet Commonly known as: TENORMIN Take 50 mg by mouth daily.   feeding supplement (PROSource TF) liquid Place 45 mLs into feeding tube 2 (two) times daily.   feeding supplement (JEVITY 1.5 CAL/FIBER) Liqd Place 1,000 mLs into feeding tube continuous.   free water Soln Place 75 mLs into feeding tube every 4 (four) hours.   melatonin 3 MG Tabs tablet Take 1 tablet (3 mg total) by mouth at bedtime.   metFORMIN 500 MG tablet Commonly known as: GLUCOPHAGE Take 500 mg by mouth 2 (two) times daily.   nicotine 21 mg/24hr patch Commonly known as: NICODERM CQ - dosed in mg/24 hours Place 1 patch (21 mg total) onto the skin daily.   pantoprazole 40 MG tablet Commonly known as: PROTONIX Take 1 tablet (40 mg total) by mouth daily.   predniSONE 10 MG tablet Commonly known as: DELTASONE Take 3 tablets (30 mg total) by mouth daily with breakfast for 1 day, THEN 2 tablets (20 mg total) daily with breakfast for 2 days, THEN 1 tablet (10 mg total) daily with breakfast for 2 days. Start taking on: Dec 31, 2021   rosuvastatin 20 MG tablet Commonly known as: CRESTOR Take 20 mg by mouth every evening.   thiamine 100 MG tablet Take 1 tablet (100 mg total) by mouth daily.       Allergies  Allergen Reactions   Codeine Nausea Only       The results of significant diagnostics from this hospitalization (including imaging, microbiology, ancillary and laboratory) are listed below for reference.    Significant Diagnostic Studies: CT HEAD WO CONTRAST (5MM)  Result Date: 12/23/2021 CLINICAL DATA:  Hemorrhage follow-up EXAM: CT HEAD WITHOUT CONTRAST TECHNIQUE: Contiguous axial images were obtained from the base of the skull through the vertex without intravenous contrast. RADIATION DOSE REDUCTION: This exam was performed according to the departmental dose-optimization program which includes automated exposure control, adjustment of the mA and/or kV according to patient size and/or use of iterative reconstruction technique. COMPARISON:  Head CT 12/21/2021 FINDINGS: Brain: Unchanged size of intraparenchymal hematoma centered in the left thalamus. There is unchanged intraventricular extension. Trace rightward midline shift is unchanged. No hydrocephalus or ventricular entrapment. Vascular: Atherosclerotic calcification of the internal carotid arteries at the skull base. No abnormal hyperdensity of the major intracranial arteries or dural venous sinuses. Skull: The visualized skull base, calvarium and extracranial soft tissues are normal. Sinuses/Orbits: No fluid levels or advanced mucosal thickening of the visualized paranasal  sinuses. No mastoid or middle ear effusion. The orbits are normal. IMPRESSION: Unchanged size of left thalamic intraparenchymal hematoma with intraventricular extension. No hydrocephalus or ventricular entrapment. Electronically Signed   By: Ulyses Jarred M.D.   On: 12/23/2021 01:59   CT HEAD WO CONTRAST (5MM)  Result Date: 12/21/2021 CLINICAL DATA:  Right thalamic bleed.  Follow-up. EXAM: CT HEAD WITHOUT CONTRAST TECHNIQUE: Contiguous axial images were obtained from the base of the skull through the vertex without intravenous contrast. RADIATION DOSE REDUCTION: This exam was performed according to the departmental  dose-optimization program which includes automated exposure control, adjustment of the mA and/or kV according to patient size and/or use of iterative reconstruction technique. COMPARISON:  CT earlier today at 17:52 FINDINGS: Brain: Hyperdense left thalamocapsular bleed is again noted, measuring 3.0 x 3.0 cm AP and transverse 3.6 cm in height. There were similar AP and transverse axis measurements earlier today but interval slight increase in height with the bleed measuring 3.3 cm height previously. There is intraventricular hemorrhagic extension with hyperdense hemorrhage slightly increased in the posterior horns of both lateral ventricles in the interval. This is more so on the left. There is background moderately developed atrophy, small vessel disease and atrophic ventriculomegaly with tiny chronic lacunar infarcts in both basal ganglia. No new hemorrhage, cortical based infarct or mass are seen. There is 3 mm of midline shift to the right of the septum pellucidum with only mild local mass effect around the hemorrhage with small margin of vasogenic edema. Vascular: The carotid siphons and distal vertebral arteries are heavily calcified. There is contrast in the vasculature from today's earlier CTA head and neck. No large vessel occlusion is seen with nondedicated technique. Skull: No skull fracture or focal lesion. Sinuses/Orbits: There is mild membrane thickening in the frontal and ethmoid sinuses and right maxillary sinus. No fluid levels. Other: Minimal fluid is again noted in the inferior mastoid air cells on both sides. IMPRESSION: 1. 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 2. 3 mm left-to-right midline shift, with slight increased intraventricular hemorrhagic content than previously. 3. Background chronic changes described above. 4. Sinus disease, minimal mastoid effusions. 5. Carotid and vertebral artery  atherosclerosis. Electronically Signed   By: Telford Nab M.D.   On: 12/21/2021 23:10   MR ANGIO HEAD WO CONTRAST  Result Date: 12/27/2021 CLINICAL DATA:  Stroke, follow up; Stroke/TIA, determine embolic source EXAM: MRI HEAD WITHOUT AND WITH CONTRAST MRA HEAD WITHOUT CONTRAST TECHNIQUE: Multiplanar, multi-echo pulse sequences of the brain and surrounding structures were acquired without and with intravenous contrast. Angiographic images of the Circle of Willis were acquired using MRA technique without intravenous contrast. CONTRAST:  7.79mL GADAVIST GADOBUTROL 1 MMOL/ML IV SOLN COMPARISON:  None Available. FINDINGS: MRI HEAD FINDINGS Brain: Acute and early subacute hemorrhage centered within the left thalamus and involving the adjacent white matter including the posterior limb of the internal capsule. Surrounding edema is present with mass effect on the adjacent left lateral ventricle. Intraventricular extension is noted with hemorrhage layering within the occipital horns. Evaluation for enhancement is somewhat limited by intrinsic T1 shortening, but there is no evidence of an underlying lesion. No evidence of prior hemorrhage remote from this site. Patchy and confluent areas of T2 hyperintensity in the supratentorial and pontine white matter are nonspecific but may reflect mild to moderate chronic microvascular ischemic changes. Prominence of the ventricles and sulci reflects parenchymal volume loss. No hydrocephalus. Vascular: Major vessel  flow voids at the skull base are preserved. Skull and upper cervical spine: Normal marrow signal. Sinuses/Orbits: Minor paranasal sinus mucosal thickening. Orbits are unremarkable. Other: Patchy bilateral mastoid fluid opacification. Sella is unremarkable. MRA HEAD FINDINGS Anterior circulation: Intracranial internal carotid arteries are patent with atherosclerotic irregularity. Anterior and middle cerebral arteries are patent. Posterior circulation: Included intracranial  vertebral arteries are patent. Basilar artery is patent. Posterior cerebral arteries are patent. There is fetal origin of the right PCA. IMPRESSION: Hemorrhage centered within the left thalamus and adjacent white matter as seen on recent prior CT imaging. Surrounding edema with mass effect and intraventricular extension are similar. No evidence of an underlying lesion. Mild intracranial atherosclerosis. Vascular imaging is otherwise unremarkable. Electronically Signed   By: Macy Mis M.D.   On: 12/27/2021 19:26   MR BRAIN W WO CONTRAST  Result Date: 12/27/2021 CLINICAL DATA:  Stroke, follow up; Stroke/TIA, determine embolic source EXAM: MRI HEAD WITHOUT AND WITH CONTRAST MRA HEAD WITHOUT CONTRAST TECHNIQUE: Multiplanar, multi-echo pulse sequences of the brain and surrounding structures were acquired without and with intravenous contrast. Angiographic images of the Circle of Willis were acquired using MRA technique without intravenous contrast. CONTRAST:  7.59mL GADAVIST GADOBUTROL 1 MMOL/ML IV SOLN COMPARISON:  None Available. FINDINGS: MRI HEAD FINDINGS Brain: Acute and early subacute hemorrhage centered within the left thalamus and involving the adjacent white matter including the posterior limb of the internal capsule. Surrounding edema is present with mass effect on the adjacent left lateral ventricle. Intraventricular extension is noted with hemorrhage layering within the occipital horns. Evaluation for enhancement is somewhat limited by intrinsic T1 shortening, but there is no evidence of an underlying lesion. No evidence of prior hemorrhage remote from this site. Patchy and confluent areas of T2 hyperintensity in the supratentorial and pontine white matter are nonspecific but may reflect mild to moderate chronic microvascular ischemic changes. Prominence of the ventricles and sulci reflects parenchymal volume loss. No hydrocephalus. Vascular: Major vessel flow voids at the skull base are preserved.  Skull and upper cervical spine: Normal marrow signal. Sinuses/Orbits: Minor paranasal sinus mucosal thickening. Orbits are unremarkable. Other: Patchy bilateral mastoid fluid opacification. Sella is unremarkable. MRA HEAD FINDINGS Anterior circulation: Intracranial internal carotid arteries are patent with atherosclerotic irregularity. Anterior and middle cerebral arteries are patent. Posterior circulation: Included intracranial vertebral arteries are patent. Basilar artery is patent. Posterior cerebral arteries are patent. There is fetal origin of the right PCA. IMPRESSION: Hemorrhage centered within the left thalamus and adjacent white matter as seen on recent prior CT imaging. Surrounding edema with mass effect and intraventricular extension are similar. No evidence of an underlying lesion. Mild intracranial atherosclerosis. Vascular imaging is otherwise unremarkable. Electronically Signed   By: Macy Mis M.D.   On: 12/27/2021 19:26   DG CHEST PORT 1 VIEW  Result Date: 12/26/2021 CLINICAL DATA:  Chest pain. EXAM: PORTABLE CHEST 1 VIEW COMPARISON:  Chest radiograph dated 12/23/2021. FINDINGS: The heart size is normal. Vascular calcifications are seen in the aortic arch. Both lungs are clear. The visualized skeletal structures are unremarkable. IMPRESSION: No active disease. Aortic Atherosclerosis (ICD10-I70.0). Electronically Signed   By: Zerita Boers M.D.   On: 12/26/2021 15:05   DG CHEST PORT 1 VIEW  Result Date: 12/23/2021 CLINICAL DATA:  Shortness of breath EXAM: PORTABLE CHEST 1 VIEW COMPARISON:  12/22/2021 FINDINGS: The heart size and mediastinal contours are within normal limits. Aortic atherosclerosis. No focal airspace consolidation, pleural effusion, or pneumothorax. The visualized skeletal structures are unremarkable. IMPRESSION: No  active disease. Electronically Signed   By: Davina Poke D.O.   On: 12/23/2021 10:04   DG CHEST PORT 1 VIEW  Result Date: 12/22/2021 CLINICAL DATA:   Respiratory abnormalities. EXAM: PORTABLE CHEST 1 VIEW COMPARISON:  May 29, 2018. FINDINGS: Enlarged cardiac silhouette, probably accentuated by AP technique. No consolidation. No visible pleural effusions or pneumothorax. No displaced fracture. IMPRESSION: No evidence of acute cardiopulmonary disease. Electronically Signed   By: Margaretha Sheffield M.D.   On: 12/22/2021 12:07   DG Abd Portable 1V  Result Date: 12/30/2021 CLINICAL DATA:  Feeding tube placement. EXAM: PORTABLE ABDOMEN - 1 VIEW COMPARISON:  None Available. FINDINGS: A small bore feeding tube is noted with tip appears to overlie the distal stomach given technique. IMPRESSION: Small bore feeding tube with tip overlying the distal stomach given technique. Consider confirmation with radiograph following contrast injection through the feeding tube, as indicated. Electronically Signed   By: Margarette Canada M.D.   On: 12/30/2021 16:46   DG Swallowing Func-Speech Pathology  Result Date: 12/25/2021 Table formatting from the original result was not included. Images from the original result were not included. Objective Swallowing Evaluation: Type of Study: MBS-Modified Barium Swallow Study  Patient Details Name: Czar Tango MRN: [id number] Date of Birth: 1951-04-26 Today's Date: 12/25/2021 Time: SLP Start Time (ACUTE ONLY): 36 -SLP Stop Time (ACUTE ONLY): 1255 SLP Time Calculation (min) (ACUTE ONLY): 22 min Past Medical History: Past Medical History: Diagnosis Date  CKD (chronic kidney disease)   COPD (chronic obstructive pulmonary disease) ([place])   Hypertension  Past Surgical History: No past surgical history on file. HPI: [name] is a 71 year old gentleman who presented via EMS as a Code Stroke after he was found at home with right sided weakness, sitting in his chair, by his wife. She had gone out to the store and returned to find him with right facial droop, right arm and leg weakness, unable to rise from chair. Head CT 5/15: "1. 3 x 3 x 3.6 cm  left thalamocapsular hemorrhage with  intraventricular extension, slightly greater in height but otherwise  unchanged.  2. 3 mm left-to-right midline shift, with slight increased  intraventricular hemorrhagic content than previously."  Pt with a past medical history of ETOH use (at least 1 pint per day), COPD, HTN, tobacco use (2-3 packs per day), and vitamin D deficiency; BSE completed and noted mild oral dysphagia with a diet of D3/thin recommended.  Pt with requirement of Bipap overnight d/t increased WOB.  CXR on 12/23/21 negative for acute changes.  Subjective: pt cooperative but with positioning limiting study  Recommendations for follow up therapy are one component of a multi-disciplinary discharge planning process, led by the attending physician.  Recommendations may be updated based on patient status, additional functional criteria and insurance authorization. Assessment / Plan / Recommendation   12/25/2021   1:00 PM Clinical Impressions Clinical Impression Pt presents with oral and pharyngeal dysphagia, but with visibility of airway limited throughout the study due to pt positioning. He has delayed oral propulsion and poor cohesion especially with thinner consistencies, that spill to the pyriform sinuses. There are times with thin and nectar thick liquids that this impacts his timing for coordination of airway protection. Penetration is definitely observed, but because the vocal folds are not clearly visible around shadow of pt's shoulder despite repositioning efforts, it is not clear if pt aspirates or if he even clears penetrates from his laryngeal vestibule. There was barium identified under the vocal folds in  between boluses once as he shifted positioning, indicating that silent aspiration had likely occurred at some point. Pt has slower oral clearance and more oral residue with honey thick liquids and purees, but he can also better protect his airway. Recommend continuing with Dys 1 diet and honey thick  liquids. SLP Visit Diagnosis Dysphagia, oropharyngeal phase (R13.12) Impact on safety and function Moderate aspiration risk     12/25/2021   1:00 PM Treatment Recommendations Treatment Recommendations Therapy as outlined in treatment plan below     12/25/2021   1:00 PM Prognosis Prognosis for Safe Diet Advancement Good   12/25/2021   1:00 PM Diet Recommendations SLP Diet Recommendations Dysphagia 1 (Puree) solids;Honey thick liquids Liquid Administration via Cup Medication Administration Crushed with puree Compensations Slow rate;Small sips/bites;Minimize environmental distractions;Monitor for anterior loss;Lingual sweep for clearance of pocketing Postural Changes Seated upright at 90 degrees;Remain semi-upright after after feeds/meals (Comment)     12/25/2021   1:00 PM Other Recommendations Oral Care Recommendations Oral care BID Follow Up Recommendations Acute inpatient rehab (3hours/day) Assistance recommended at discharge Frequent or constant Supervision/Assistance Functional Status Assessment Patient has had a recent decline in their functional status and demonstrates the ability to make significant improvements in function in a reasonable and predictable amount of time.   12/25/2021   1:00 PM Frequency and Duration  Speech Therapy Frequency (ACUTE ONLY) min 2x/week Treatment Duration 2 weeks     12/25/2021   1:00 PM Oral Phase Oral Phase Impaired Oral - Honey Cup Delayed oral transit;Lingual/palatal residue Oral - Nectar Cup Lingual/palatal residue Oral - Nectar Straw Lingual/palatal residue Oral - Thin Cup Decreased bolus cohesion;Premature spillage Oral - Thin Straw Decreased bolus cohesion;Premature spillage Oral - Puree Reduced posterior propulsion;Weak lingual manipulation;Lingual/palatal residue;Delayed oral transit    12/25/2021   1:00 PM Pharyngeal Phase Pharyngeal Phase Impaired Pharyngeal- Honey Cup WFL Pharyngeal- Nectar Cup Delayed swallow initiation-pyriform sinuses;Penetration/Aspiration during swallow  Pharyngeal- Nectar Straw Delayed swallow initiation-pyriform sinuses;Penetration/Aspiration during swallow Pharyngeal- Thin Cup Delayed swallow initiation-pyriform sinuses;Penetration/Aspiration during swallow Pharyngeal- Thin Straw Delayed swallow initiation-pyriform sinuses;Penetration/Aspiration during swallow Pharyngeal- Puree WFL     View : No data to display.    Osie Bond., M.A. [place] Office [phone] Secure chat preferred 12/25/2021, 3:17 PM                     ECHOCARDIOGRAM COMPLETE  Result Date: 12/22/2021    ECHOCARDIOGRAM REPORT   Patient Name:   [facility] Date of Exam: 12/22/2021 Medical Rec #:  [id number]      Height:       67.0 in Accession #:    [id number]     Weight:       170.0 lb Date of Birth:  1950-09-02     BSA:          1.887 m Patient Age:    51 years       BP:           129/77 mmHg Patient Gender: M              HR:           78 bpm. Exam Location:  Inpatient Procedure: 2D Echo, Cardiac Doppler and Color Doppler Indications:    Stroke I63.9  History:        Patient has no prior history of Echocardiogram examinations.                 COPD; Risk Factors:Hypertension.  Sonographer:  Eulah Pont RDCS Referring Phys: [id number] [facility] IMPRESSIONS  1. Left ventricular ejection fraction, by estimation, is 65 to 70%. The left ventricle has hyperdynamic function. The left ventricle has no regional wall motion abnormalities. Left ventricular diastolic parameters are consistent with Grade I diastolic dysfunction (impaired relaxation).  2. Right ventricular systolic function is normal. The right ventricular size is normal. Tricuspid regurgitation signal is inadequate for assessing PA pressure.  3. The pericardial effusion is circumferential. There is no evidence of cardiac tamponade.  4. The mitral valve is normal in structure. No evidence of mitral valve regurgitation. No evidence of mitral stenosis.  5. The aortic valve is tricuspid. There is mild  calcification of the aortic valve. Aortic valve regurgitation is not visualized. Aortic valve sclerosis is present, with no evidence of aortic valve stenosis.  6. The inferior vena cava is normal in size with greater than 50% respiratory variability, suggesting right atrial pressure of 3 mmHg. FINDINGS  Left Ventricle: Left ventricular ejection fraction, by estimation, is 65 to 70%. The left ventricle has hyperdynamic function. The left ventricle has no regional wall motion abnormalities. The left ventricular internal cavity size was normal in size. There is no left ventricular hypertrophy. Left ventricular diastolic parameters are consistent with Grade I diastolic dysfunction (impaired relaxation). Right Ventricle: The right ventricular size is normal. No increase in right ventricular wall thickness. Right ventricular systolic function is normal. Tricuspid regurgitation signal is inadequate for assessing PA pressure. Left Atrium: Left atrial size was normal in size. Right Atrium: Right atrial size was normal in size. Pericardium: Trivial pericardial effusion is present. The pericardial effusion is circumferential. There is no evidence of cardiac tamponade. Mitral Valve: The mitral valve is normal in structure. No evidence of mitral valve regurgitation. No evidence of mitral valve stenosis. Tricuspid Valve: The tricuspid valve is normal in structure. Tricuspid valve regurgitation is not demonstrated. No evidence of tricuspid stenosis. Aortic Valve: The aortic valve is tricuspid. There is mild calcification of the aortic valve. Aortic valve regurgitation is not visualized. Aortic valve sclerosis is present, with no evidence of aortic valve stenosis. Pulmonic Valve: The pulmonic valve was normal in structure. Pulmonic valve regurgitation is not visualized. No evidence of pulmonic stenosis. Aorta: The aortic root is normal in size and structure. Venous: The inferior vena cava is normal in size with greater than 50%  respiratory variability, suggesting right atrial pressure of 3 mmHg. IAS/Shunts: No atrial level shunt detected by color flow Doppler.  LEFT VENTRICLE PLAX 2D LVIDd:         4.20 cm     Diastology LVIDs:         2.40 cm     LV e' medial:    6.25 cm/s LV PW:         1.10 cm     LV E/e' medial:  11.6 LV IVS:        1.00 cm     LV e' lateral:   7.28 cm/s LVOT diam:     2.00 cm     LV E/e' lateral: 9.9 LV SV:         75 LV SV Index:   40 LVOT Area:     3.14 cm  LV Volumes (MOD) LV vol d, MOD A2C: 65.0 ml LV vol d, MOD A4C: 88.6 ml LV vol s, MOD A2C: 26.5 ml LV vol s, MOD A4C: 27.9 ml LV SV MOD A2C:     38.5 ml LV SV MOD A4C:  88.6 ml LV SV MOD BP:      51.6 ml RIGHT VENTRICLE RV S prime:     11.80 cm/s TAPSE (M-mode): 1.8 cm LEFT ATRIUM             Index        RIGHT ATRIUM           Index LA diam:        3.20 cm 1.70 cm/m   RA Area:     11.90 cm LA Vol (A2C):   33.2 ml 17.59 ml/m  RA Volume:   23.80 ml  12.61 ml/m LA Vol (A4C):   30.1 ml 15.95 ml/m LA Biplane Vol: 31.5 ml 16.69 ml/m  AORTIC VALVE LVOT Vmax:   125.00 cm/s LVOT Vmean:  80.900 cm/s LVOT VTI:    0.238 m  AORTA Ao Root diam: 3.80 cm Ao Asc diam:  3.50 cm MITRAL VALVE MV Area (PHT): 3.27 cm    SHUNTS MV Decel Time: 232 msec    Systemic VTI:  0.24 m MV E velocity: 72.40 cm/s  Systemic Diam: 2.00 cm MV A velocity: 85.30 cm/s MV E/A ratio:  0.85 Mihai Croitoru MD Electronically signed by Thurmon FairMihai Croitoru MD Signature Date/Time: 12/22/2021/10:56:34 AM    Final    CT HEAD CODE STROKE WO CONTRAST  Result Date: 12/21/2021 CLINICAL DATA:  Code stroke. EXAM: CT HEAD WITHOUT CONTRAST TECHNIQUE: Contiguous axial images were obtained from the base of the skull through the vertex without intravenous contrast. RADIATION DOSE REDUCTION: This exam was performed according to the departmental dose-optimization program which includes automated exposure control, adjustment of the mA and/or kV according to patient size and/or use of iterative reconstruction  technique. COMPARISON:  None Available. FINDINGS: Brain: Hyperdense intraparenchymal hemorrhage centered in the left thalamus and medial basal ganglia, extending into the left lateral ventricle. Surrounding hypodensity, likely mild edema. Mild local mass effect. No midline shift. No evidence of acute infarction, cerebral edema, mass, mass effect, or midline shift. No hydrocephalus. Periventricular white matter changes, likely the sequela of chronic small vessel ischemic disease. Vascular: No hyperdense vessel. Atherosclerotic calcifications in the intracranial carotid and vertebral arteries. Skull: Normal. Negative for fracture or focal lesion. Sinuses/Orbits: Minimal mucosal thickening in the right maxillary sinus. The orbits are unremarkable. Other: The mastoid air cells are well aerated. ASPECTS [facility]([place] Stroke Program Early CT Score) - Ganglionic level infarction (caudate, lentiform nuclei, internal capsule, insula, M1-M3 cortex): 7 - Supraganglionic infarction (M4-M6 cortex): 3 Total score (0-10 with 10 being normal): 10 IMPRESSION: 1. 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 Mild surrounding edema and local mass effect without significant midline shift. 2. ASPECTS is 10 Code stroke imaging results were communicated on 12/21/2021 at 5:51 pm to provider Dr. Otelia LimesLindzen via secure text paging. Electronically Signed   By: Wiliam KeAlison  Vasan M.D.   On: 12/21/2021 17:54   CT ANGIO HEAD NECK W WO CM (CODE STROKE)  Result Date: 12/21/2021 CLINICAL DATA:  Code stroke. EXAM: CT ANGIOGRAPHY HEAD AND NECK TECHNIQUE: Multidetector CT imaging of the head and neck was performed using the standard protocol during bolus administration of intravenous contrast. Multiplanar CT image reconstructions and MIPs were obtained to evaluate the vascular anatomy. Carotid stenosis measurements (when applicable) are  obtained utilizing NASCET criteria, using the distal internal carotid diameter as the denominator. RADIATION DOSE REDUCTION: This exam was performed according to the departmental dose-optimization program which includes automated exposure  control, adjustment of the mA and/or kV according to patient size and/or use of iterative reconstruction technique. CONTRAST:  22mL OMNIPAQUE IOHEXOL 350 MG/ML SOLN COMPARISON:  CT head from the same day. FINDINGS: CTA NECK FINDINGS Aortic arch: Aortic atherosclerosis. Great vessel origins are patent without significant stenosis. Air where it origin of the subclavian artery on the right, which courses behind the esophagus and trachea. Right carotid system: Atherosclerosis at the carotid bifurcation without greater than 50% stenosis. Left carotid system: Atherosclerosis at the carotid bifurcation and involving the proximal ICA without greater than 50% stenosis. Vertebral arteries: Severe stenosis of bilateral proximal vertebral arteries. Moderate stenosis of the more distal right V2 vertebral artery. Left dominant. Skeleton: No acute findings. Other neck: No acute findings. Upper chest: Emphysema. Review of the MIP images confirms the above findings CTA HEAD FINDINGS Anterior circulation: Calcific atherosclerosis of bilateral intracranial ICAs. The calcific nature of plaque limits accurate quantification of the stenosis, which is approximately mild-to-moderate. Bilateral MCAs and ACAs are patent. Small infundibulum arising from the left supraclinoid ICA posteriorly. Posterior circulation: Left dominant vertebral artery. Severe stenosis of the right vertebral artery at its dural margin. Moderate stenosis of the proximal left intradural vertebral artery. Basilar artery and posterior cerebral arteries are patent without proximal high-grade stenosis. Right fetal type PCA, anatomic variant. Venous sinuses: As permitted by contrast timing, patent. Anatomic variants: Detailed above. Review  of the MIP images confirms the above findings IMPRESSION: CTA head: 1. No emergent large vessel occlusion. 2. No evidence of aneurysm or vascular malformation in the region of the acute left thalamic hemorrhage; however, acute blood products limits assessment. Follow-up vascular imaging could further evaluate if clinically warranted. 3. 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 CTA neck: 1. Severe stenosis of proximal bilateral vertebral arteries in the neck. 2. Bilateral carotid bifurcation atherosclerosis without greater than 50% stenosis. 3. Aortic Atherosclerosis (ICD10-I70.0) and emphysema (ICD10-J43.9). Electronically Signed   By: Margaretha Sheffield M.D.   On: 12/21/2021 18:23    Microbiology: Recent Results (from the past 240 hour(s))  MRSA Next Gen by PCR, Nasal     Status: None   Collection Time: 12/21/21  7:47 PM   Specimen: Nasal Mucosa; Nasal Swab  Result Value Ref Range Status   MRSA by PCR Next Gen NOT DETECTED NOT DETECTED Final    Comment: (NOTE) The GeneXpert MRSA Assay (FDA approved for NASAL specimens only), is one component of a comprehensive MRSA colonization surveillance program. It is not intended to diagnose MRSA infection nor to guide or monitor treatment for MRSA infections. Test performance is not FDA approved in patients less than 39 years old. Performed at [facility], [place] [address], [place], [place] [address]      Labs: Basic Metabolic Panel: Recent Labs  Lab 12/26/21 0232 12/27/21 0038 12/28/21 0111 12/29/21 0429 12/30/21 0412 12/31/21 0128  NA 143 138 138 138 138  --   K 4.1 4.3 4.0 4.1 4.3  --   CL 113* 111 111 110 111  --   CO2 19* 16* 16* 19* 20*  --   GLUCOSE 117* 136* 128* 103* 105*  --   BUN 39* 35* 35* 42* 38*  --   CREATININE 2.00* 1.68* 1.65* 1.58* 1.57*  --   CALCIUM 8.9 8.5* 8.3* 8.7* 8.8*  --   MG  --   --   --   --   --  2.4  PHOS  --   --   --   --   --  3.7   Liver Function Tests: No results for  input(s): AST, ALT, ALKPHOS, BILITOT, PROT, ALBUMIN in the last 168 hours. No results for input(s): LIPASE, AMYLASE in the last 168 hours. No results for input(s): AMMONIA in the last 168 hours. CBC: Recent Labs  Lab 12/25/21 0555 12/26/21 0232 12/28/21 0111  WBC 8.9 9.3 7.4  HGB 14.8 14.5 14.1  HCT 44.1 44.1 42.1  MCV 90.4 90.6 90.0  PLT 244 278 211   Cardiac Enzymes: No results for input(s): CKTOTAL, CKMB, CKMBINDEX, TROPONINI in the last 168 hours. BNP: BNP (last 3 results) Recent Labs    12/23/21 0607  BNP 136.8*    ProBNP (last 3 results) No results for input(s): PROBNP in the last 8760 hours.  CBG: Recent Labs  Lab 12/30/21 0606 12/30/21 1159 12/30/21 1617 12/30/21 2152 12/31/21 0959  GLUCAP 113* 143* 216* 119* 217*       Signed:  Nita Sells MD   [facility] 12/31/2021, 10:25 AM

## 2021-12-31 NOTE — Progress Notes (Signed)
Orthopedic Tech Progress Note Patient Details:  Ronnie Jones 1951-06-03 [id number]  Order for a Rehab Combination [facility]) called into [facility] @ 1830.  Patient ID: Ronnie Jones, male   DOB: Aug 19, 1950, 71 y.o.   MRN: [id number]  Docia Furl 12/31/2021, 6:33 PM

## 2021-12-31 NOTE — Care Management Important Message (Signed)
Important Message  Patient Details  Name: Ronnie Jones MRN: [id number] Date of Birth: 11/29/1950   Medicare Important Message Given:  Yes     Memory Argue 12/31/2021, 3:18 PM

## 2021-12-31 NOTE — H&P (Signed)
Physical Medicine and Rehabilitation Admission H&P     CC: Left thalamic intracerebral hemorrhage   HPI: Ronnie Jones is a 71 year old RH male with history of HTN, T2DM who presented to the ED by EMS after found by wife on  12/21/2021 with slurred speech and facial drooping. Code stroke initiated. UDS negative and ETOH <10. Evaluated by Dr. Cheral Marker. Alcohol intake approximately one pint of tequila each evening. Wife reported significant daily aspirin use; up to ten 81 mg daily. He does not give a reason for the aspirin use only stating " bad habit". On exam, RUE was flaccid with RLE weakness. CTH revealed left subcortical ICH therefore TNK not administered. CTA head/neck showed severe stenosis of proximal B-VA with bilateral carotid bifurcation and aortic atherosclerosis. Cardene infusion was added for hypertensive urgency.      Follow up CT head 05/17 showed stable left thalamic IPH with intraventricular extension but without hydrocephalus. He had issues with ETOH withdrawal and was treated with phenobarb X 5 days for as   CIWA protcol. He was started on D3, thins but had overt aspiration requiring BIPAP overnight therefore downgraded on D1, honey and treated with nebs, IV Unasyn X 5 days and steroids for  AECOPD. 2 D echo done revealing EF 65-7% with circumferential pericardial effusion without tamponade and grade 1 DD.    MBS showed prolonged mastication with silent aspiration of thins/nectar and he was maintained on D1,honey. Po intake has been poor and cortak placed today for nutritional supplementation with  Mg/Phos level in am to monitor for refeeding syndrome. . Bleed felt to be due to hypertensive emergency and Dr. Erlinda Hong reccommended SBP<160.  Lisinopril d/c due to AKI with hyperkalemia and hydralazine titrated upwards for better control. He was also noted to have multiple vitamin deficiencies- Vit D- 17.96,  Vitamin B6 (inadequate?), Vitamin C < 0.1. He was transitioned to oral taper on 05/23.         2D echo EF 60-65% LDL = 192 A1c = 6.1   Pt reports no BM lately- per chart, and nursing, LBM 5/24 in evening- pt denies constipation;  Is using condom catheter Denies pain Having R sided swelling.    Review of Systems  Constitutional:  Positive for chills and fever.  HENT:  Negative for hearing loss.   Eyes:  Negative for double vision.  Respiratory:  Positive for cough. Negative for shortness of breath.        Wife endorses snoring, but states patient has not been evaluated for OSA  Cardiovascular:  Negative for chest pain and palpitations.  Gastrointestinal:  Negative for abdominal pain, nausea and vomiting.  Genitourinary:  Negative for dysuria.       Has condom cath. States nocturia at home  Musculoskeletal:  Negative for myalgias.  Neurological:  Positive for weakness. Negative for dizziness and headaches.  Psychiatric/Behavioral:  The patient has insomnia.   All other systems reviewed and are negative.         Past Medical History:  Diagnosis Date   CKD (chronic kidney disease)     COPD (chronic obstructive pulmonary disease) (HCC)     Diabetes ([place])     Hypertension     Tobacco abuse             Past Surgical History:  Procedure Laterality Date   LUNG SURGERY Right             Family History  Problem Relation Age of Onset   Hypertension Mother  COPD Brother     Diabetes Brother     Seizures Brother          Social History: Married. 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 He for reports that he has been more than 2-3 packs/day of cigarettes. He has a 80.00 pack-year smoking history. He has never used smokeless tobacco. He reports current alcohol use--a pint of tequila daily . He reports that he does not use drugs.         Allergies  Allergen Reactions   Codeine Nausea Only          Medications Prior to Admission  Medication Sig Dispense Refill   acetaminophen (TYLENOL) 500 MG tablet Take 500 mg by mouth every 6 (six) hours as needed  for mild pain or fever.       albuterol (VENTOLIN HFA) 108 (90 Base) MCG/ACT inhaler Inhale 2 puffs into the lungs every 4 (four) hours as needed.       amLODipine (NORVASC) 10 MG tablet Take 10 mg by mouth daily.       aspirin 325 MG tablet Take 650 mg by mouth daily.       aspirin EC 81 MG tablet Take 162 mg by mouth daily. Swallow whole.       atenolol (TENORMIN) 50 MG tablet Take 50 mg by mouth daily.       lisinopril (ZESTRIL) 20 MG tablet Take 20 mg by mouth daily.       metFORMIN (GLUCOPHAGE) 500 MG tablet Take 500 mg by mouth 2 (two) times daily.       Vitamin D, Ergocalciferol, (DRISDOL) 1.25 MG (50000 UNIT) CAPS capsule Take 50,000 Units by mouth once a week.       rosuvastatin (CRESTOR) 20 MG tablet Take 20 mg by mouth every evening. (Patient not taking: Reported on 12/21/2021)              Home: Home Living Family/patient expects to be discharged to:: Private residence Living Arrangements: Spouse/significant other Available Help at Discharge: Available 24 hours/day Type of Home: House Home Access: Stairs to enter [employer] of Steps: 2 Entrance Stairs-Rails: Right, Left, Can reach both Home Layout: One level Bathroom Shower/Tub: Tub/shower unit, Multimedia programmer: Standard Bathroom Accessibility: Yes Home Equipment: Wheelchair - manual, Conservation officer, nature (2 wheels)  Lives With: Spouse   Functional History: Prior Function Prior Level of Function : Independent/Modified Independent, Driving, Working/employed Mobility Comments: no AD ADLs Comments: lays brick for work   Functional Status:  Mobility: Bed Mobility Overal bed mobility: Needs Assistance Bed Mobility: Rolling, Sidelying to Sit Rolling: Min assist Sidelying to sit: Mod assist, HOB elevated Supine to sit: Mod assist, HOB elevated Sit to supine: Mod assist, HOB elevated Sit to sidelying: Mod assist General bed mobility comments: directional verbal cues, assist for R LE management off  EOB, modA for trunk elevation, pt with solid effort in trying to pull self up with L UE Transfers Overall transfer level: Needs assistance Equipment used: 2 person hand held assist Transfers: Sit to/from Stand, Bed to chair/wheelchair/BSC Sit to Stand: Mod assist, +2 physical assistance Bed to/from chair/wheelchair/BSC transfer type:: Step pivot General transfer comment: pt modAx2 with use of bed pad to power up with R knee blocked, maxA to complete step to transfer to chair, maxA for R LE advancement, requires blocking of R knee to advance L LE Ambulation/Gait Ambulation/Gait assistance: Max assist Gait Distance (Feet): 2 Feet Assistive device: 1 person hand held assist Gait Pattern/deviations:  Shuffle General Gait Details: x1 sidestep toward [facility] but unstable, unsafe to progress further with only +1 assist Gait velocity interpretation: <1.31 ft/sec, indicative of household ambulator   ADL: ADL Overall ADL's : Needs assistance/impaired Eating/Feeding: Minimal assistance Eating/Feeding Details (indicate cue type and reason): requires cups items stable, can use LUE to self feed Grooming: Minimal assistance Grooming Details (indicate cue type and reason): rubbing lotion on RUE with LUE Upper Body Bathing: Supervision/ safety, Sitting Upper Body Bathing Details (indicate cue type and reason): simulated, use of LUE to wash RUE Lower Body Bathing: Maximal assistance, +2 for physical assistance, +2 for safety/equipment Upper Body Dressing : Moderate assistance, Sitting Lower Body Dressing: Maximal assistance, +2 for physical assistance, +2 for safety/equipment Toilet Transfer: Moderate assistance, +2 for physical assistance Toilet Transfer Details (indicate cue type and reason): simulated to chair Toileting- Clothing Manipulation and Hygiene: Total assistance Functional mobility during ADLs: Maximal assistance, +2 for physical assistance, +2 for safety/equipment General ADL Comments: R hemi,  poor sitting balance, difficulty breathing, impaired cognition   Cognition: Cognition Overall Cognitive Status: Impaired/Different from baseline Arousal/Alertness: Awake/alert Orientation Level: Oriented X4 Year: 2021 Day of Week: Correct Attention: Focused, Sustained Focused Attention: Impaired Sustained Attention: Impaired Memory: Impaired Memory Impairment: Decreased short term memory Problem Solving: Impaired Problem Solving Impairment: Verbal basic Cognition Arousal/Alertness: Awake/alert Behavior During Therapy: Flat affect Overall Cognitive Status: Impaired/Different from baseline Area of Impairment: Attention, Awareness Orientation Level: Disoriented to, Situation, Time, Place Current Attention Level: Sustained Memory: Decreased short-term memory Following Commands: Follows one step commands with increased time Safety/Judgement: Decreased awareness of safety, Decreased awareness of deficits Awareness: Intellectual Problem Solving: Slow processing, Difficulty sequencing, Decreased initiation General Comments: pt remains with flat affect and reports "I feel like shit". Pt gives good  effort during therapy     Blood pressure (!) 152/78, pulse 79, temperature 97.8 F (36.6 C), temperature source Oral, resp. rate 18, height 5\' 7"  (1.702 m), weight 77.1 kg, SpO2 97 %. Physical Exam Vitals and nursing note reviewed.  Constitutional:      General: He is not in acute distress.    Appearance: He is normal weight. He is ill-appearing. He is not toxic-appearing.     Comments: Tends to drift off when not engage. Occasional pursed lip breathing. Had a cup of ice at bedside with breakfast tray. Picked up cup to drink milk independently, asking for ice.    Seen in afternoon- drinking honey thick liquids with LUE- more awake; alert; mostly appropriate, appears chronically ill; NAD  HENT:     Head: Normocephalic and atraumatic.     Comments: Mild R facial droop- R tongue deviation     Nose: Nose normal. No congestion.     Mouth/Throat:     Mouth: Mucous membranes are dry.     Comments: Has cortrak in place- crusting around nose and mouth Eyes:     General:        Right eye: No discharge.        Left eye: No discharge.     Extraocular Movements: Extraocular movements intact.  Cardiovascular:     Rate and Rhythm: Normal rate and regular rhythm.     Heart sounds: Normal heart sounds. No murmur heard.   No gallop.  Pulmonary:     Effort: Pulmonary effort is normal.     Comments: Decreased breath sounds Throughout- no W/R/R Abdominal:     General: There is distension.     Tenderness: There is no abdominal tenderness.  Comments: Protuberant, NT, distended?; normoactive BS; soft  Genitourinary:    Comments: Condom catheter in place- medium-dark amber urine Musculoskeletal:     Cervical back: Neck supple. No tenderness.     Comments: RUE with 3+ pitting from elbow down. RLE with 1+ pitting tibial/pedal edema.    0/5 in RUE 5/5 in LUE RLE- KE/HF 1/5; DF 0/5; and PF 1/5 LLE- 5/5  Skin:    General: Skin is warm and dry.     Findings: Bruising present.     Comments: Tanned (recent trip to the beach) with bruise on right elbow and healing bruises on LUE. Well healed old laceration right thigh. Callus on plantar surface 5th MT Thickened skin on RUE/RLE- with pitting edema 2-3+ in RUE to above elbow and 1-2+ in RLE to knee  Neurological:     Mental Status: He is alert and oriented to person, place, and time.     Comments: Mild dysarthria. He was able to state name, age, needed cues for DOB and month, thought that he was in [place]--when cued place "[place]".  He is able to follow simple motor commands with occasional perseveration.   MAS of 2 in R shoulder, elbow and 1+ to 2 in R wrist- significant tone in RUE- pretty normal in RLE  Psychiatric:     Comments: Extremely flat- Ox2-3      Lab Results Last 48 Hours        Results for orders placed or performed during  the hospital encounter of 12/21/21 (from the past 48 hour(s))  Glucose, capillary     Status: Abnormal    Collection Time: 12/29/21 12:06 PM  Result Value Ref Range    Glucose-Capillary 145 (H) 70 - 99 mg/dL      Comment: Glucose reference range applies only to samples taken after fasting for at least 8 hours.    Comment 1 Notify RN      Comment 2 Document in Chart    Glucose, capillary     Status: Abnormal    Collection Time: 12/29/21  4:07 PM  Result Value Ref Range    Glucose-Capillary 246 (H) 70 - 99 mg/dL      Comment: Glucose reference range applies only to samples taken after fasting for at least 8 hours.    Comment 1 Notify RN      Comment 2 Document in Chart    Glucose, capillary     Status: Abnormal    Collection Time: 12/29/21  9:52 PM  Result Value Ref Range    Glucose-Capillary 69 (L) 70 - 99 mg/dL      Comment: Glucose reference range applies only to samples taken after fasting for at least 8 hours.    Comment 1 Notify RN      Comment 2 Document in Chart    Glucose, capillary     Status: None    Collection Time: 12/29/21 10:14 PM  Result Value Ref Range    Glucose-Capillary 97 70 - 99 mg/dL      Comment: Glucose reference range applies only to samples taken after fasting for at least 8 hours.  Basic metabolic panel     Status: Abnormal    Collection Time: 12/30/21  4:12 AM  Result Value Ref Range    Sodium 138 135 - 145 mmol/L    Potassium 4.3 3.5 - 5.1 mmol/L    Chloride 111 98 - 111 mmol/L    CO2 20 (L) 22 - 32 mmol/L  Glucose, Bld 105 (H) 70 - 99 mg/dL      Comment: Glucose reference range applies only to samples taken after fasting for at least 8 hours.    BUN 38 (H) 8 - 23 mg/dL    Creatinine, Ser 1.57 (H) 0.61 - 1.24 mg/dL    Calcium 8.8 (L) 8.9 - 10.3 mg/dL    GFR, Estimated 48 (L) >60 mL/min      Comment: (NOTE) Calculated using the CKD-EPI Creatinine Equation (2021)      Anion gap 7 5 - 15      Comment: Performed at [facility]  [address], [place], [place] [address]  Glucose, capillary     Status: Abnormal    Collection Time: 12/30/21  6:06 AM  Result Value Ref Range    Glucose-Capillary 113 (H) 70 - 99 mg/dL      Comment: Glucose reference range applies only to samples taken after fasting for at least 8 hours.  Glucose, capillary     Status: Abnormal    Collection Time: 12/30/21 11:59 AM  Result Value Ref Range    Glucose-Capillary 143 (H) 70 - 99 mg/dL      Comment: Glucose reference range applies only to samples taken after fasting for at least 8 hours.    Comment 1 Notify RN      Comment 2 Document in Chart    Glucose, capillary     Status: Abnormal    Collection Time: 12/30/21  4:17 PM  Result Value Ref Range    Glucose-Capillary 216 (H) 70 - 99 mg/dL      Comment: Glucose reference range applies only to samples taken after fasting for at least 8 hours.  Glucose, capillary     Status: Abnormal    Collection Time: 12/30/21  9:52 PM  Result Value Ref Range    Glucose-Capillary 119 (H) 70 - 99 mg/dL      Comment: Glucose reference range applies only to samples taken after fasting for at least 8 hours.    Comment 1 Notify RN      Comment 2 Document in Chart    Magnesium     Status: None    Collection Time: 12/31/21  1:28 AM  Result Value Ref Range    Magnesium 2.4 1.7 - 2.4 mg/dL      Comment: Performed at [facility], [place] [address], [place], [place] [address]  Phosphorus     Status: None    Collection Time: 12/31/21  1:28 AM  Result Value Ref Range    Phosphorus 3.7 2.5 - 4.6 mg/dL      Comment: Performed at [facility] [address] [place], [place] [address]  Glucose, capillary     Status: Abnormal    Collection Time: 12/31/21  9:59 AM  Result Value Ref Range    Glucose-Capillary 217 (H) 70 - 99 mg/dL      Comment: Glucose reference range applies only to samples taken after fasting for at least 8 hours.       Imaging Results (Last 48 hours)  DG Abd Portable 1V   Result Date:  12/30/2021 CLINICAL DATA:  Feeding tube placement. EXAM: PORTABLE ABDOMEN - 1 VIEW COMPARISON:  None Available. FINDINGS: A small bore feeding tube is noted with tip appears to overlie the distal stomach given technique. IMPRESSION: Small bore feeding tube with tip overlying the distal stomach given technique. Consider confirmation with radiograph following contrast injection through the feeding  tube, as indicated. Electronically Signed   By: Margarette Canada M.D.   On: 12/30/2021 16:46           Blood pressure (!) 152/78, pulse 79, temperature 97.8 F (36.6 C), temperature source Oral, resp. rate 18, height 5\' 7"  (1.702 m), weight 77.1 kg, SpO2 97 %.   Medical Problem List and Plan: 1. Functional deficits secondary to left thalamic/basal ganglia intracerebral hemorrhage with intraventricular extension             -patient may  shower             -ELOS/Goals: 18-21 days- min-mod A 2.  Antithrombotics: -DVT/anticoagulation:  Pharmaceutical: Heparin             -antiplatelet therapy: none 3. Pain Management: Tylenol as needed 4. Mood: LCSW to evaluate and provide emotional support             -antipsychotic agents: n/a 5. Neuropsych: This patient is capable of making decisions on his own behalf. 6. Skin/Wound Care: Routine skin care checks 7. Fluids/Electrolytes/Nutrition: Routine Is and Os and follow-up chemistries             --dysphagia 1; honey thick- Has Cortrak in place and receiving TF's.  8: T2DM: Hgb A1c- metformin on hold due to AKI/variable intake (puree diet). --Monitor BS ac/hs and use SSI for elevated BS. --Change TF to 6 pm-6 am 9: Hypertension: Off Lisinopril due to AKI/hyperkalemia  --continue amlodipine, atenolol, hydralazine 10:  COPD: Continue Anoro Ellipta daily.  Flutter valve qid.  --Will check CXR due to decreased BS/SOB and ice chips.   --Continue to taper prednisone. --Duoneb prn 11: Alcohol abuse: cessation counseling. Off phenobarb. Check Thiamine level.   --continue B12, thiamine and folic acid 12: Tobacco abuse: cessation counseling --continue nicotine patch 13: Hyperlipidemia: LDL- 192 and Trig- 499-->continue Crestor 40 mg 14: AKI: Baseline SCr- 1.36-->2.39-->1.57  --Encourage --drinking honey liquids but not touching puree's.              --continue water flushes qid via cortak --Recheck renal status in am.  15. Vitamin C deficiency: On daily supplement 16. Vitamin D deficiency: Continue weekly supplement.   17. 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  Additionally, I have personally reviewed laboratory data, imaging studies, as well as relevant notes and concur with the physician assistant's documentation above.   The patient's status has not changed from the original H&P.  Any changes in documentation from the acute care chart have been noted above.

## 2021-12-31 NOTE — Progress Notes (Signed)
IP rehab admissions - I have received authorization from insurance carrier for acute inpatient rehab admission today.  I do have beds available today on CIR.  Please let me know if patient medically ready to admit to CIR today.  Call me for questions.  [phone]

## 2022-01-01 ENCOUNTER — Inpatient Hospital Stay (HOSPITAL_COMMUNITY): Payer: Medicare HMO

## 2022-01-01 DIAGNOSIS — R609 Edema, unspecified: Secondary | ICD-10-CM

## 2022-01-01 DIAGNOSIS — I619 Nontraumatic intracerebral hemorrhage, unspecified: Secondary | ICD-10-CM | POA: Diagnosis not present

## 2022-01-01 LAB — GLUCOSE, CAPILLARY
Glucose-Capillary: 150 mg/dL — ABNORMAL HIGH (ref 70–99)
Glucose-Capillary: 115 mg/dL — ABNORMAL HIGH (ref 70–99)
Glucose-Capillary: 123 mg/dL — ABNORMAL HIGH (ref 70–99)
Glucose-Capillary: 146 mg/dL — ABNORMAL HIGH (ref 70–99)
Glucose-Capillary: 117 mg/dL — ABNORMAL HIGH (ref 70–99)
Glucose-Capillary: 129 mg/dL — ABNORMAL HIGH (ref 70–99)

## 2022-01-01 LAB — COMPREHENSIVE METABOLIC PANEL
ALT: 39 U/L (ref 0–44)
AST: 34 U/L (ref 15–41)
Albumin: 2.8 g/dL — ABNORMAL LOW (ref 3.5–5.0)
Alkaline Phosphatase: 49 U/L (ref 38–126)
Anion gap: 8 (ref 5–15)
BUN: 39 mg/dL — ABNORMAL HIGH (ref 8–23)
CO2: 21 mmol/L — ABNORMAL LOW (ref 22–32)
Calcium: 8.5 mg/dL — ABNORMAL LOW (ref 8.9–10.3)
Chloride: 108 mmol/L (ref 98–111)
Creatinine, Ser: 1.36 mg/dL — ABNORMAL HIGH (ref 0.61–1.24)
GFR, Estimated: 57 mL/min — ABNORMAL LOW (ref >60)
Glucose, Bld: 115 mg/dL — ABNORMAL HIGH (ref 70–99)
Potassium: 4 mmol/L (ref 3.5–5.1)
Sodium: 137 mmol/L (ref 135–145)
Total Bilirubin: 0.3 mg/dL (ref 0.3–1.2)
Total Protein: 5.5 g/dL — ABNORMAL LOW (ref 6.5–8.1)

## 2022-01-01 LAB — CBC WITH DIFFERENTIAL/PLATELET
Abs Immature Granulocytes: 0.18 10*3/uL — ABNORMAL HIGH (ref 0.00–0.07)
Basophils Absolute: 0 10*3/uL (ref 0.0–0.1)
Basophils Relative: 0 %
Eosinophils Absolute: 0 10*3/uL (ref 0.0–0.5)
Eosinophils Relative: 0 %
HCT: 41.2 % (ref 39.0–52.0)
Hemoglobin: 13.9 g/dL (ref 13.0–17.0)
Immature Granulocytes: 2 %
Lymphocytes Relative: 11 %
Lymphs Abs: 1 10*3/uL (ref 0.7–4.0)
MCH: 30.2 pg (ref 26.0–34.0)
MCHC: 33.7 g/dL (ref 30.0–36.0)
MCV: 89.6 fL (ref 80.0–100.0)
Monocytes Absolute: 1.4 10*3/uL — ABNORMAL HIGH (ref 0.1–1.0)
Monocytes Relative: 14 %
Neutro Abs: 7.1 10*3/uL (ref 1.7–7.7)
Neutrophils Relative %: 73 %
Platelets: 188 10*3/uL (ref 150–400)
RBC: 4.6 MIL/uL (ref 4.22–5.81)
RDW: 13 % (ref 11.5–15.5)
WBC: 9.8 10*3/uL (ref 4.0–10.5)
nRBC: 0 % (ref 0.0–0.2)

## 2022-01-01 LAB — MAGNESIUM: Magnesium: 2.2 mg/dL (ref 1.7–2.4)

## 2022-01-01 LAB — PHOSPHORUS: Phosphorus: 3.3 mg/dL (ref 2.5–4.6)

## 2022-01-01 IMAGING — CT CT HEAD W/O CM
4 series · 15 of 47 positions shown, 17 images · non-contrast
Comparison: CT [DATE], [DATE], MRI [DATE]

CLINICAL DATA: Follow-up hemorrhage



[Series 3: head wo · axial · 0.43mm/px · z∈[-89,+31]mm · 7 of 33 slices shown, 9 images]
[im 5/33  brain]
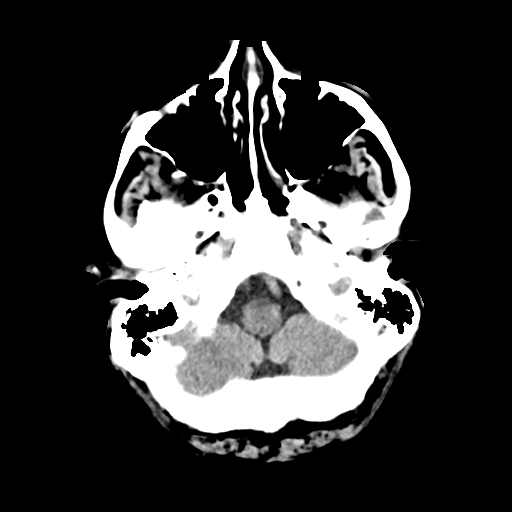
[im 5/33  bone]
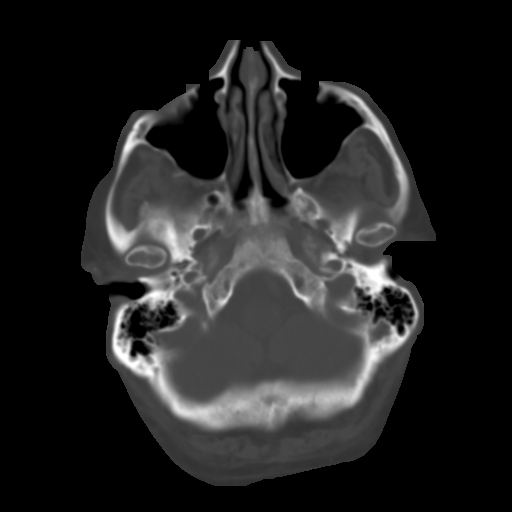
[im 9/33  brain]
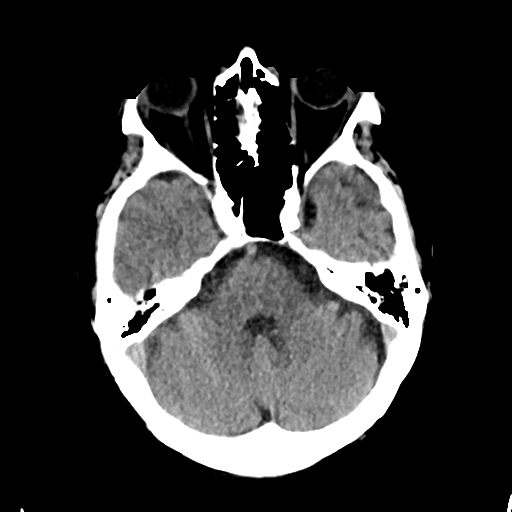
[im 13/33  brain]
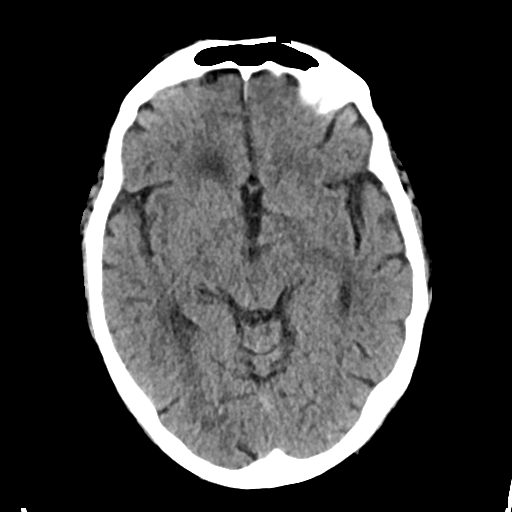
[im 17/33  brain]
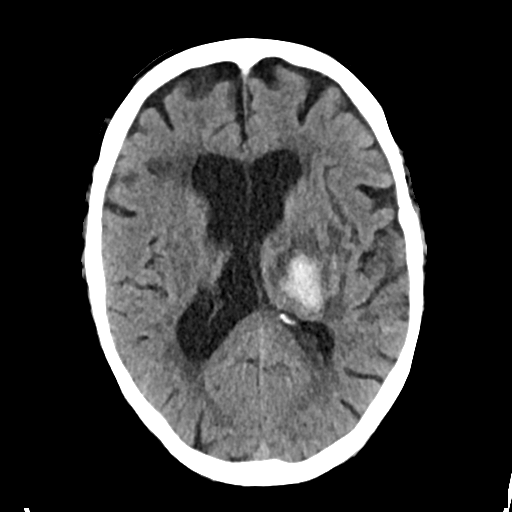
[im 21/33  brain]
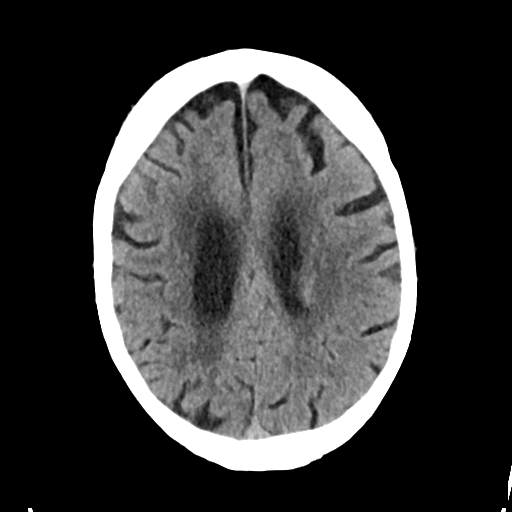
[im 21/33  bone]
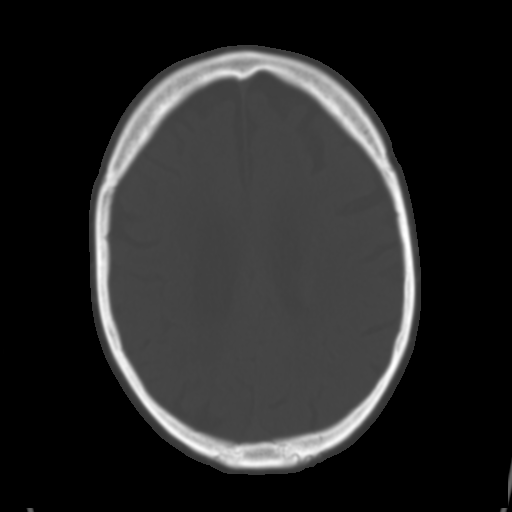
[im 25/33  brain]
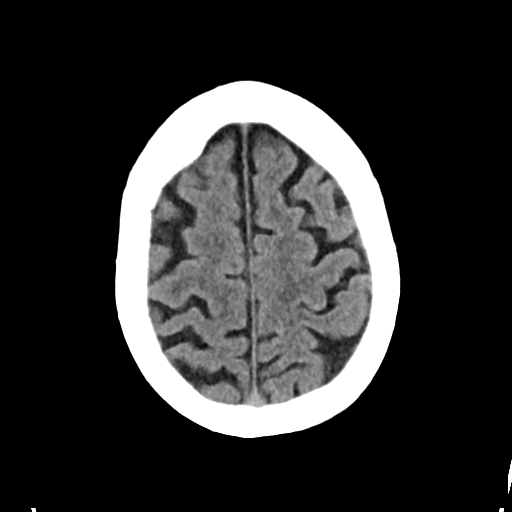
[im 29/33  brain]
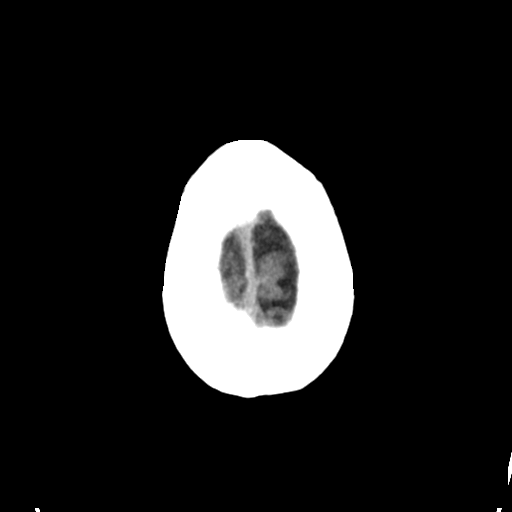

[Series 4: head bone · axial · 0.43mm/px · z∈[-93,-77]mm · 2 of 81 slices shown]
[im 9/81  bone]
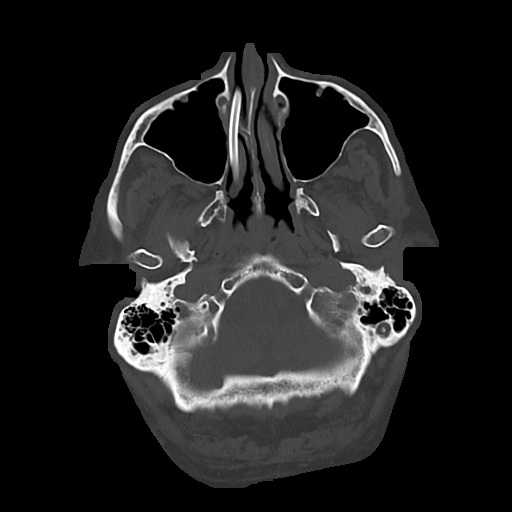
[im 17/81  bone]
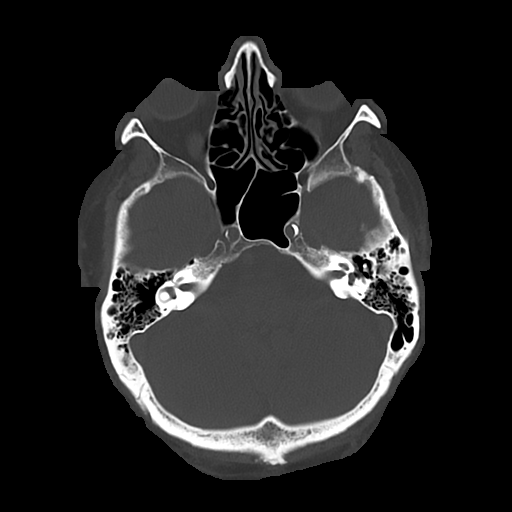

[Series 5: cor soft · coronal · 0.31mm/px · 3 of 70 slices shown]
[im 24/70  brain]
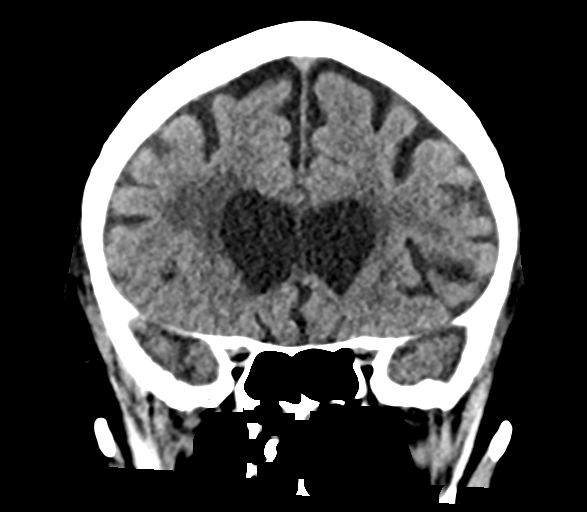
[im 31/70  brain]
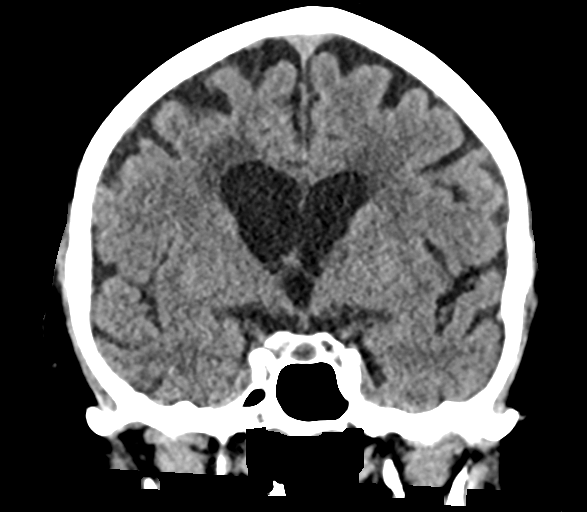
[im 39/70  brain]
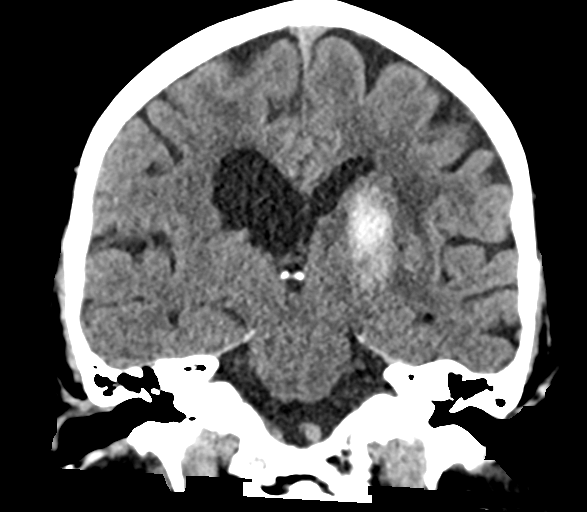

[Series 6: sag soft · sagittal · 0.30mm/px · 3 of 57 slices shown]
[im 19/57  brain]
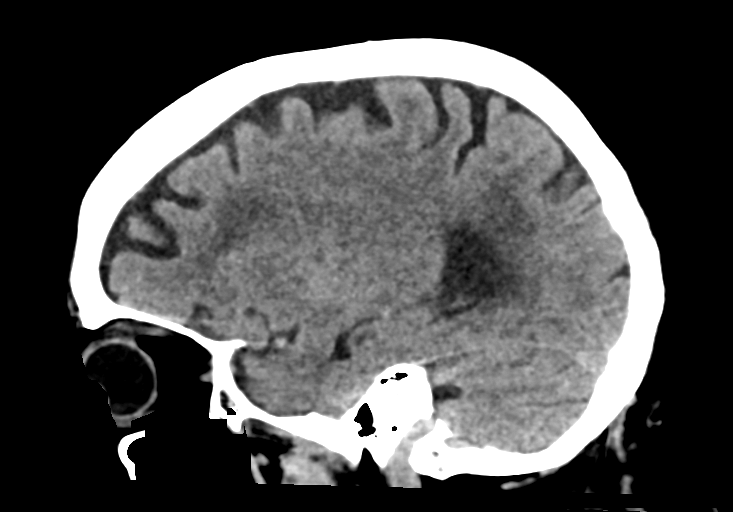
[im 29/57  brain]
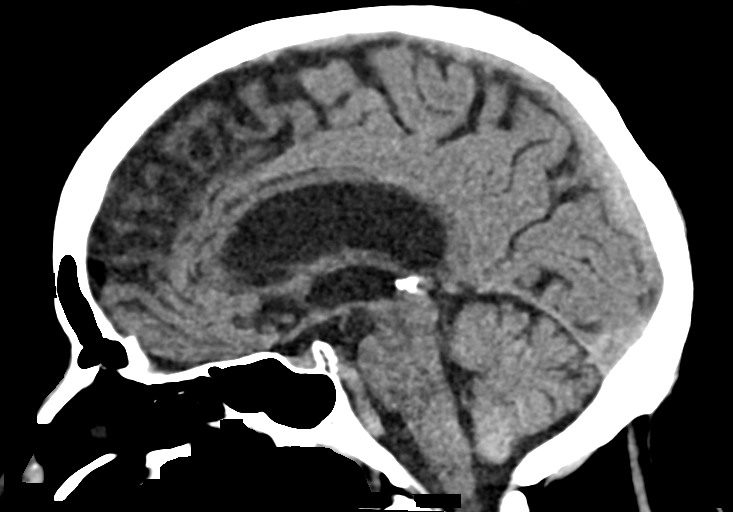
[im 38/57  brain]
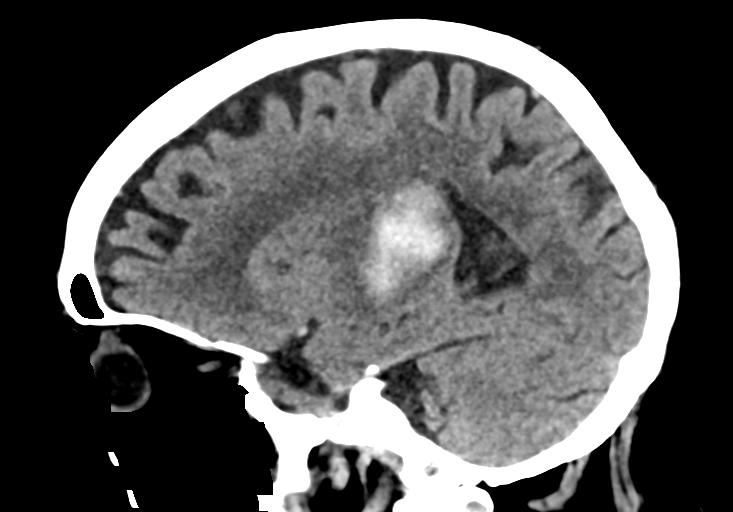

[15 of 47 positions shown; findings below may reference images not displayed]

FINDINGS: Brain: Redemonstrated intraparenchymal hemorrhage centered within
left thalamus, this has slightly decreased in size compared with the
previous CTs. There is interval increase in surrounding hypodense
edema. There is decreased bilateral intraventricular hemorrhage
compared to the previous CT examinations. Local mass effect with
septum pellucidum shifted 3 mm to the right as before. There is
slight interval increase in lateral ventricular dilatation. Third
and fourth ventricles are stable in size. Atrophy and chronic small
vessel ischemic changes of the white matter. No new hemorrhagic
focus.

Vascular: No hyperdense vessels. Carotid vascular and vertebral
calcification

Skull: Normal. Negative for fracture or focal lesion.

Sinuses/Orbits: No acute finding.

Other: None
IMPRESSION: 1. Slight interval decrease in size of the intraparenchymal
hemorrhage centered within the left thalamus, hemorrhage is becoming
less dense compared to the prior CTs. There is also slight interval
decrease in bilateral intraventricular hemorrhage. Increased edema
surrounding the hemorrhage since initial CT from [DATE], probably no
great change compared with the interval MRI on [REDACTED].
Local mass effect on the left lateral ventricle with about 3 mm
shift of the septum to the right as before. The lateral ventricles
are slightly enlarged compared to prior but the third and fourth
ventricles are stable in size.
2. Atrophy and chronic small vessel ischemic changes of the white
matter.

## 2022-01-01 IMAGING — DX DG ABD PORTABLE 1V
1 series · 1 of 1 positions shown · non-contrast
Comparison: None Available.

CLINICAL DATA: NG tube placement

EXAM:
PORTABLE ABDOMEN - 1 VIEW

[abdomen]
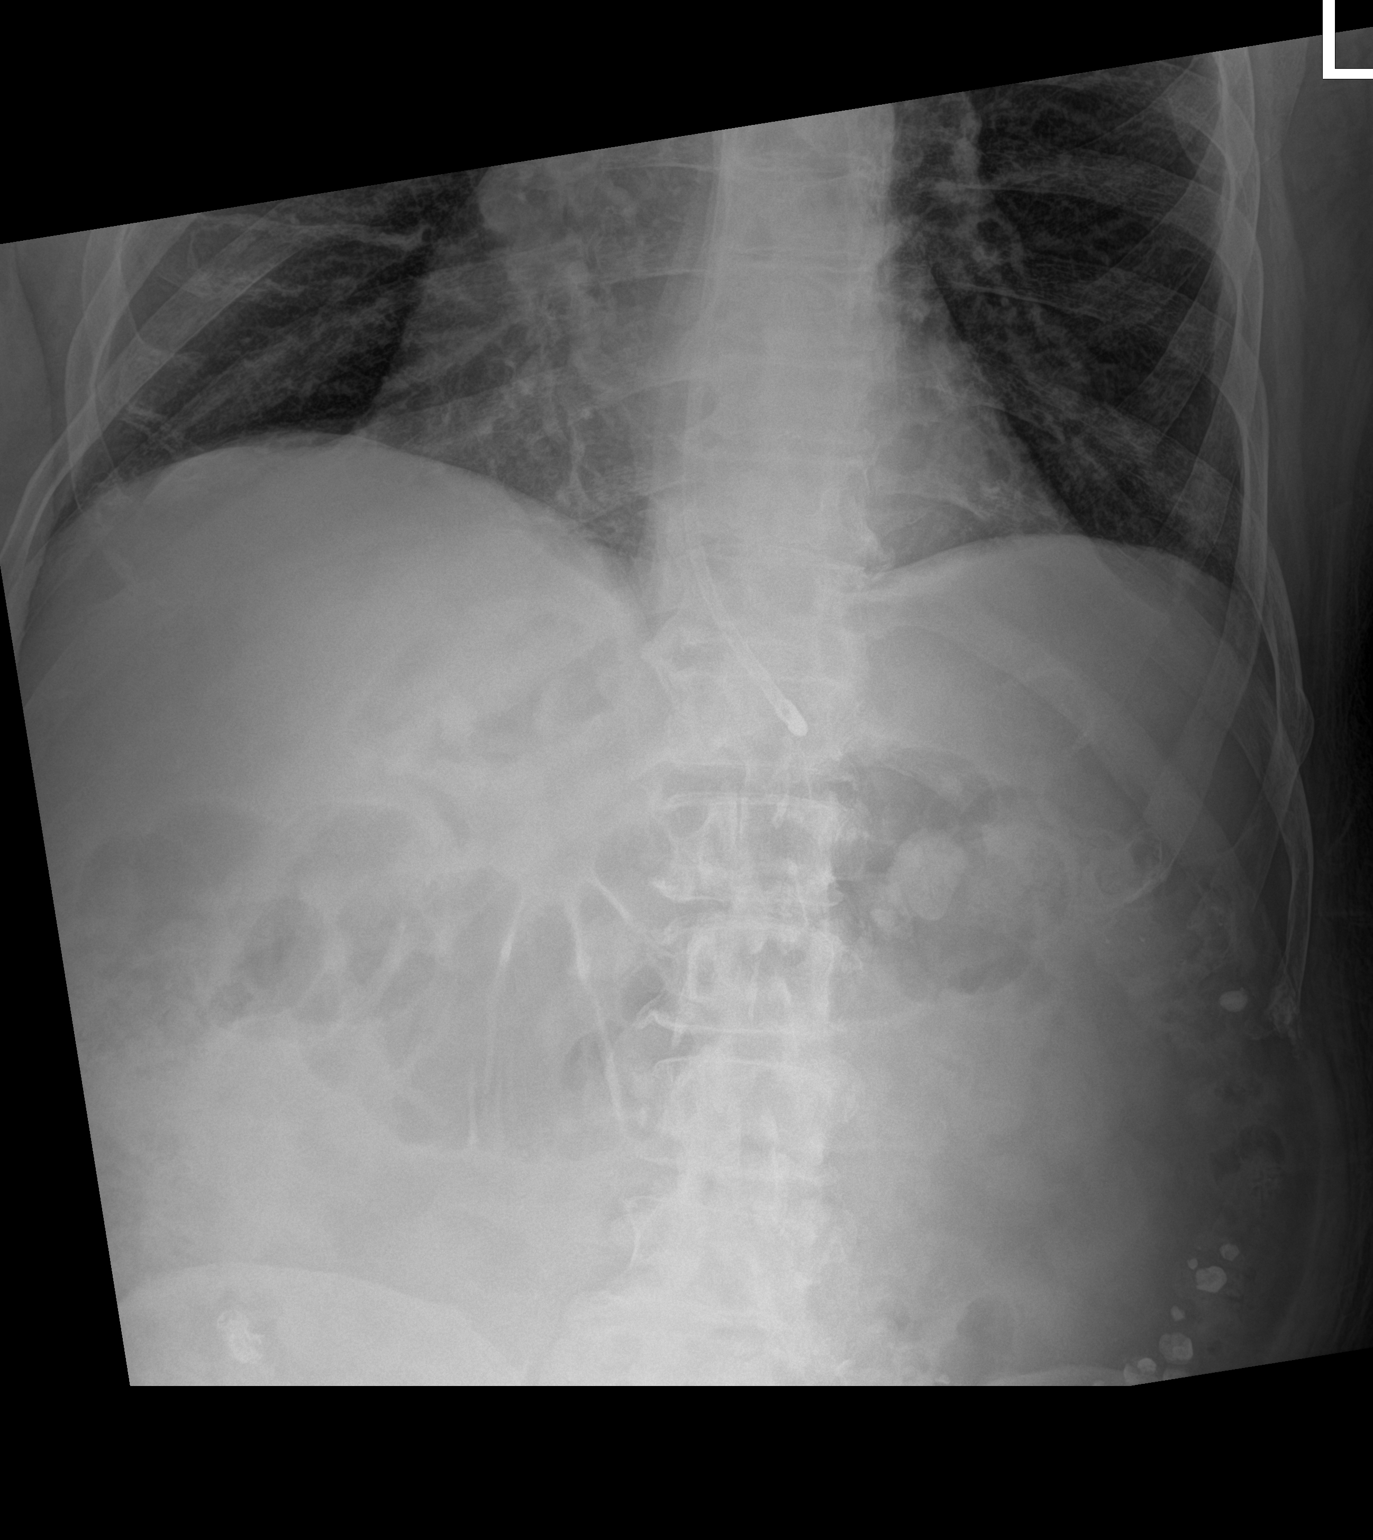

[1 of 1 positions shown; findings below may reference images not displayed]

FINDINGS: Weighted feeding tube terminates at the GE junction. Advancement at
least 5 cm stomach is suggested.
IMPRESSION: Weighted feeding tube terminates at the GE junction. Advancement at
least 5 cm stomach is suggested.

## 2022-01-01 IMAGING — DX DG ABD PORTABLE 1V
1 series · 1 of 1 positions shown · non-contrast
Comparison: [DATE] [DATE] a.m.

CLINICAL DATA: Feeding tube replacement

EXAM:
PORTABLE ABDOMEN - 1 VIEW

[abdomen supine]
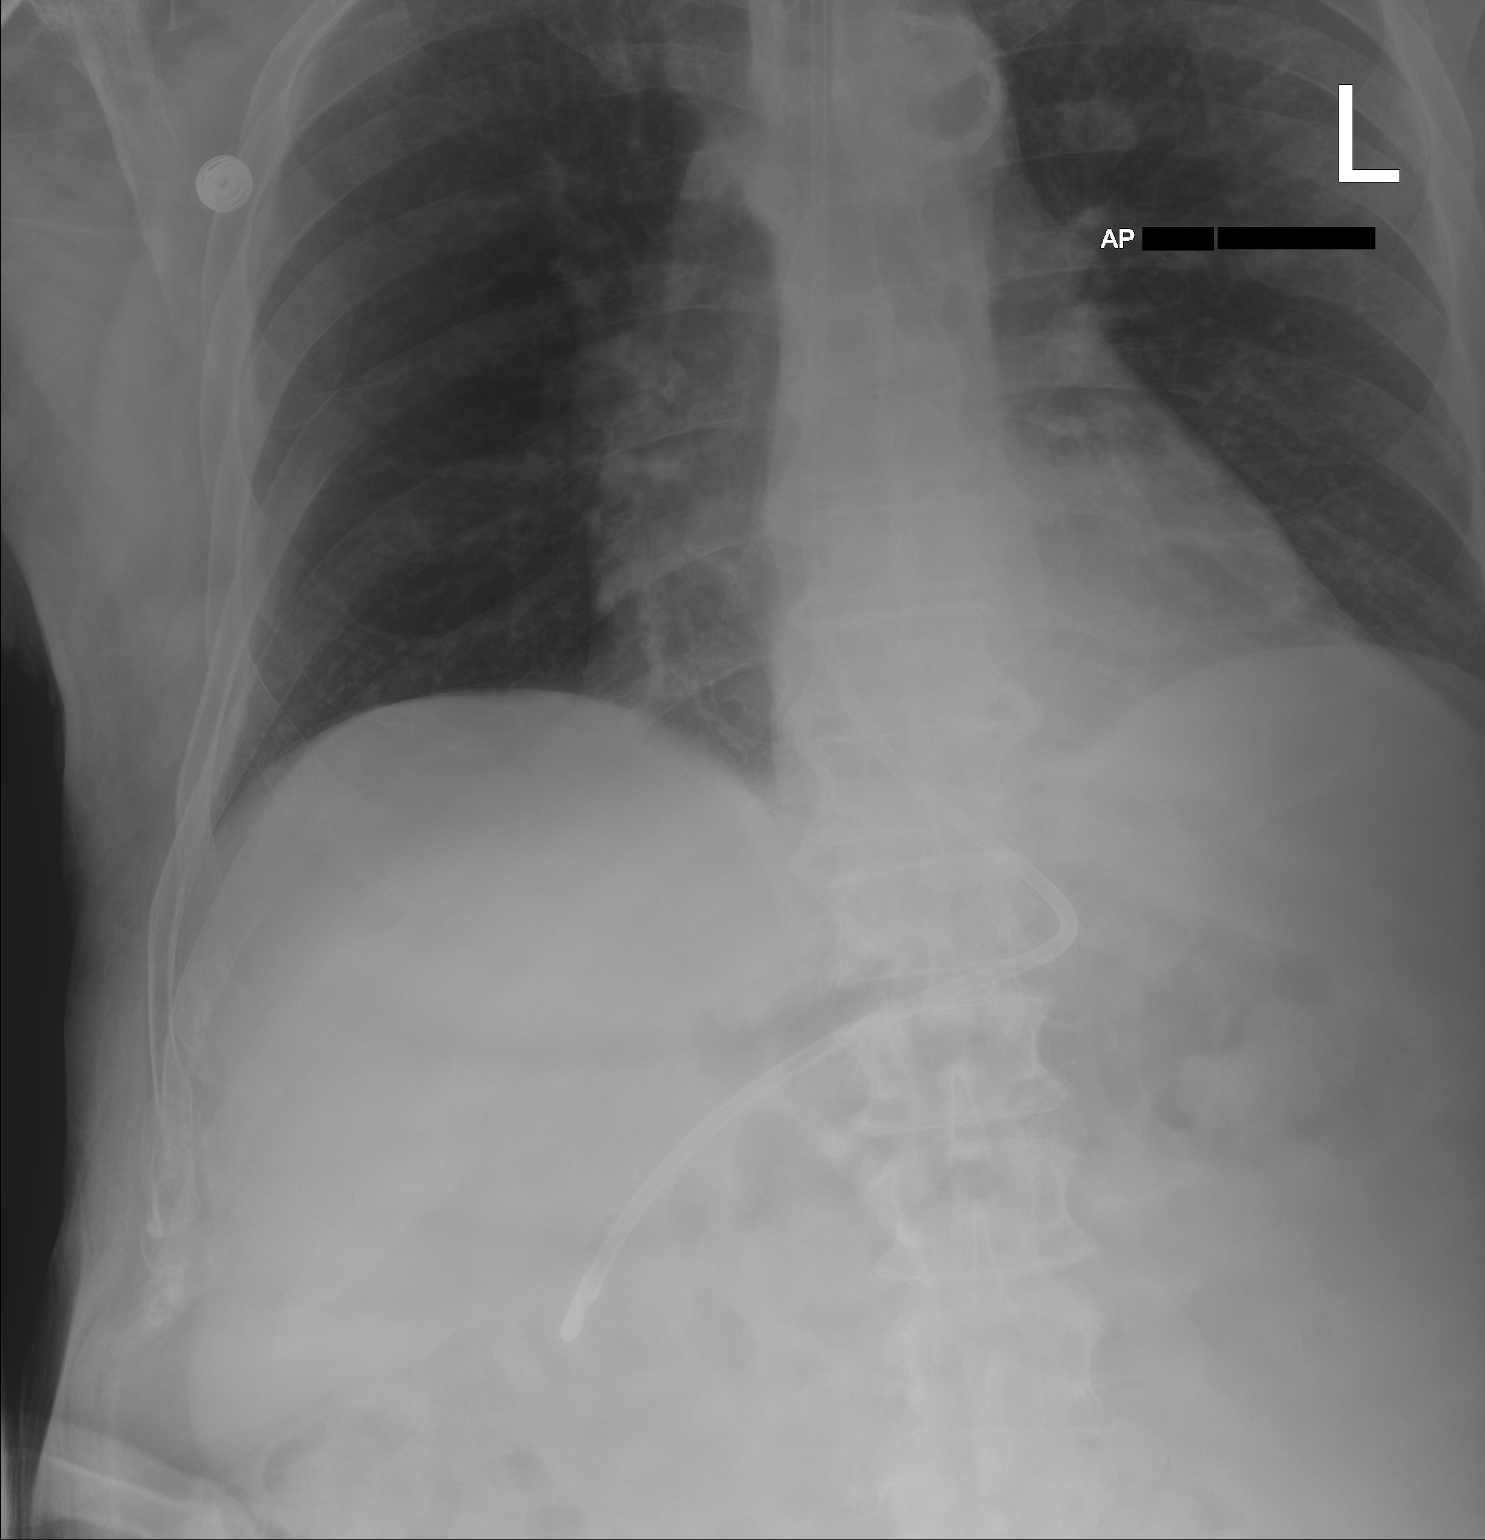

[1 of 1 positions shown; findings below may reference images not displayed]

FINDINGS: Feeding tube tip projects over the descending duodenum.

Atherosclerotic calcification of the aortic arch.
IMPRESSION: 1. The feeding tube tip currently projects over the descending
duodenum.
2.  Aortic Atherosclerosis ([OC]-[OC]).

## 2022-01-01 MED ORDER — JEVITY 1.5 CAL/FIBER PO LIQD
1120.0000 mL | ORAL | Status: DC
  Administered 2022-01-01 – 2022-01-03 (×3): 1120 mL
  Filled 2022-01-01 (×2): qty 2000

## 2022-01-01 MED ORDER — TIZANIDINE HCL 2 MG PO TABS
2.0000 mg | ORAL_TABLET | Freq: Three times a day (TID) | ORAL | Status: DC
  Administered 2022-01-01 – 2022-01-04 (×10): 2 mg via ORAL
  Filled 2022-01-01 (×10): qty 1

## 2022-01-01 MED ORDER — FREE WATER
200.0000 mL | Status: DC
  Administered 2022-01-02 – 2022-01-11 (×57): 200 mL

## 2022-01-01 MED ORDER — HEPARIN (PORCINE) 25000 UT/250ML-% IV SOLN
1350.0000 [IU]/h | INTRAVENOUS | Status: DC
  Administered 2022-01-01: 900 [IU]/h via INTRAVENOUS
  Administered 2022-01-03: 850 [IU]/h via INTRAVENOUS
  Administered 2022-01-04 – 2022-01-05 (×2): 1050 [IU]/h via INTRAVENOUS
  Administered 2022-01-06 – 2022-01-07 (×2): 1200 [IU]/h via INTRAVENOUS
  Administered 2022-01-07 – 2022-01-08 (×2): 1250 [IU]/h via INTRAVENOUS
  Administered 2022-01-09: 1350 [IU]/h via INTRAVENOUS
  Filled 2022-01-01 (×10): qty 250

## 2022-01-01 MED ORDER — ASCORBIC ACID 500 MG PO TABS
500.0000 mg | ORAL_TABLET | Freq: Two times a day (BID) | ORAL | Status: DC
Start: 2022-01-01 — End: 2022-01-19
  Administered 2022-01-01 – 2022-01-19 (×36): 500 mg via ORAL
  Filled 2022-01-01 (×36): qty 1

## 2022-01-01 NOTE — Progress Notes (Signed)
Patient's feeding tube caught under his back. Nasal bridle still in place, feeding tube remains in right nare. Feeding tube position marking is currently at 52 cm, previous marking was at 68 cm. Pt denies pain and is not showing any signs of discomfort or distress.Tube feeds paused. On-call NP notified. Orders received to keep tube feed on hold and abdominal xray ordered for placement verification.

## 2022-01-01 NOTE — Progress Notes (Signed)
Vascular notified RN that pt is with DVT on the right arm. [name], PA is aware. Arm is currently restricted and keep elevated on pillow.   Gerald Stabs, RN

## 2022-01-01 NOTE — Progress Notes (Signed)
Abdominal x-ray completed. On- call NP made aware. Orders received not to use feeding tube,continue to hold tube feed and water flushes,and spot check glucose now and at 0400.

## 2022-01-01 NOTE — Progress Notes (Addendum)
Right upper extremity venous duplex and bilateral lower extremity venous duplex completed. Refer to "CV Proc" under chart review to view preliminary results.  Preliminary results discussed with Delle Reining, PA-C.  01/01/2022 3:43 PM Eula Fried., MHA, RVT, RDCS, RDMS

## 2022-01-01 NOTE — Plan of Care (Signed)
  Problem: RH Swallowing Goal: LTG Patient will consume least restrictive diet using compensatory strategies with assistance (SLP) Description: LTG:  Patient will consume least restrictive diet using compensatory strategies with assistance (SLP) Flowsheets (Taken 01/01/2022 1603) LTG: Pt Patient will consume least restrictive diet using compensatory strategies with assistance of (SLP): Supervision Goal: LTG Patient will participate in dysphagia therapy to increase swallow function with assistance (SLP) Description: LTG:  Patient will participate in dysphagia therapy to increase swallow function with assistance (SLP) Flowsheets (Taken 01/01/2022 1603) LTG: Pt will participate in dysphagia therapy to increase swallow function with assistance of (SLP): Supervision Goal: LTG Pt will demonstrate functional change in swallow as evidenced by bedside/clinical objective assessment (SLP) Description: LTG: Patient will demonstrate functional change in swallow as evidenced by bedside/clinical objective assessment (SLP) Flowsheets (Taken 01/01/2022 1603) LTG: Patient will demonstrate functional change in swallow as evidenced by bedside/clinical objective assessment: Oropharyngeal swallow   Problem: RH Cognition - SLP Goal: RH LTG Patient will demonstrate orientation with cues Description:  LTG:  Patient will demonstrate orientation to person/place/time/situation with cues (SLP)   Flowsheets (Taken 01/01/2022 1603) LTG Patient will demonstrate orientation to:  Person  Place  Time  Situation LTG: Patient will demonstrate orientation using cueing (SLP): Supervision   Problem: RH Expression Communication Goal: LTG Patient will increase speech intelligibility (SLP) Description: LTG: Patient will increase speech intelligibility at word/phrase/conversation level with cues, % of the time (SLP) Flowsheets (Taken 01/01/2022 1603) LTG: Patient will increase speech intelligibility (SLP): Supervision   Problem: RH  Problem Solving Goal: LTG Patient will demonstrate problem solving for (SLP) Description: LTG:  Patient will demonstrate problem solving for basic/complex daily situations with cues  (SLP) Flowsheets (Taken 01/01/2022 1603) LTG: Patient will demonstrate problem solving for (SLP): Basic daily situations LTG Patient will demonstrate problem solving for: Minimal Assistance - Patient > 75%   Problem: RH Memory Goal: LTG Patient will use memory compensatory aids to (SLP) Description: LTG:  Patient will use memory compensatory aids to recall biographical/new, daily complex information with cues (SLP) Flowsheets (Taken 01/01/2022 1603) LTG: Patient will use memory compensatory aids to (SLP): Supervision   Problem: RH Awareness Goal: LTG: Patient will demonstrate awareness during functional activites type of (SLP) Description: LTG: Patient will demonstrate awareness during functional activites type of (SLP) Flowsheets (Taken 01/01/2022 1603) Patient will demonstrate during cognitive/linguistic activities awareness type of: Intellectual LTG: Patient will demonstrate awareness during cognitive/linguistic activities with assistance of (SLP): Supervision

## 2022-01-01 NOTE — Progress Notes (Signed)
PROGRESS NOTE   Subjective/Complaints:  Appears accurate with Y/N response to simple questions, "do you feel this?" Comparing Right to left side   Objective:   DG Chest 2 View  Result Date: 12/31/2021 CLINICAL DATA:  Short of breath EXAM: CHEST - 2 VIEW COMPARISON:  12/26/2021 FINDINGS: Heart size upper normal. Vascularity normal. Lungs clear without infiltrate or effusion. Feeding tube passes into the stomach with the tip not visualized. Chronic left rib fracture IMPRESSION: No active cardiopulmonary disease. Electronically Signed   By: Marlan Palau M.D.   On: 12/31/2021 19:15   DG Abd Portable 1V  Result Date: 01/01/2022 CLINICAL DATA:  NG tube placement EXAM: PORTABLE ABDOMEN - 1 VIEW COMPARISON:  None Available. FINDINGS: Weighted feeding tube terminates at the GE junction. Advancement at least 5 cm stomach is suggested. IMPRESSION: Weighted feeding tube terminates at the GE junction. Advancement at least 5 cm stomach is suggested. Electronically Signed   By: Charline Bills M.D.   On: 01/01/2022 00:46   DG Abd Portable 1V  Result Date: 12/30/2021 CLINICAL DATA:  Feeding tube placement. EXAM: PORTABLE ABDOMEN - 1 VIEW COMPARISON:  None Available. FINDINGS: A small bore feeding tube is noted with tip appears to overlie the distal stomach given technique. IMPRESSION: Small bore feeding tube with tip overlying the distal stomach given technique. Consider confirmation with radiograph following contrast injection through the feeding tube, as indicated. Electronically Signed   By: Harmon Pier M.D.   On: 12/30/2021 16:46   Recent Labs    01/01/22 0523  WBC 9.8  HGB 13.9  HCT 41.2  PLT 188   Recent Labs    12/30/21 0412 01/01/22 0523  NA 138 137  K 4.3 4.0  CL 111 108  CO2 20* 21*  GLUCOSE 105* 115*  BUN 38* 39*  CREATININE 1.57* 1.36*  CALCIUM 8.8* 8.5*    Intake/Output Summary (Last 24 hours) at 01/01/2022 0748 Last  data filed at 01/01/2022 0449 Gross per 24 hour  Intake 100 ml  Output 700 ml  Net -600 ml        Physical Exam: Vital Signs Blood pressure (!) 153/81, pulse 75, temperature (!) 97.5 F (36.4 C), temperature source Oral, resp. rate 17, height 5\' 7"  (1.702 m), weight 77.1 kg, SpO2 96 %.    Assessment/Plan: 1. Functional deficits which require 3+ hours per day of interdisciplinary therapy in a comprehensive inpatient rehab setting. Physiatrist is providing close team supervision and 24 hour management of active medical problems listed below. Physiatrist and rehab team continue to assess barriers to discharge/monitor patient progress toward functional and medical goals  Care Tool:  Bathing              Bathing assist       Upper Body Dressing/Undressing Upper body dressing        Upper body assist      Lower Body Dressing/Undressing Lower body dressing            Lower body assist       Toileting Toileting    Toileting assist       Transfers Chair/bed transfer  Transfers assist  Locomotion Ambulation   Ambulation assist              Walk 10 feet activity   Assist           Walk 50 feet activity   Assist           Walk 150 feet activity   Assist           Walk 10 feet on uneven surface  activity   Assist           Wheelchair     Assist               Wheelchair 50 feet with 2 turns activity    Assist            Wheelchair 150 feet activity     Assist          Blood pressure (!) 153/81, pulse 75, temperature (!) 97.5 F (36.4 C), temperature source Oral, resp. rate 17, height 5\' 7"  (1.702 m), weight 77.1 kg, SpO2 96 %.  Medical Problem List and Plan: 1. 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  Antithrombotics: -DVT/anticoagulation:  Pharmaceutical: Heparin             -antiplatelet therapy: none 3. Pain Management: Tylenol as needed 4. Mood: LCSW to evaluate and provide emotional support             -antipsychotic agents: n/a 5. Neuropsych: This patient is capable of making decisions on his own behalf. 6. Skin/Wound Care: Routine skin care checks 7. Fluids/Electrolytes/Nutrition: Routine Is and Os and follow-up chemistries             --dysphagia 1; honey thick- Has Cortrak in place and receiving TF's.  8: T2DM: Hgb A1c- metformin on hold due to AKI/variable intake (puree diet). --Monitor BS ac/hs and use SSI for elevated BS. --Change TF to 6 pm-6 am CBG (last 3)  Recent Labs    01/01/22 0117 01/01/22 0444 01/01/22 0609  GLUCAP 150* 115* 123*  Well controlled 5/26  9: Hypertension: Off Lisinopril due to AKI/hyperkalemia  --continue amlodipine, atenolol, hydralazine Vitals:   01/01/22 0433 01/01/22 0552  BP: (!) 154/74 (!) 153/81  Pulse: 73 75  Resp: 18 17  Temp: (!) 97.5 F (36.4 C)   SpO2: 96% 96%  Elevated systolic , starting tizanidine for spasticity which may lower BP will monitor   10:  COPD: Continue Anoro Ellipta daily.  Flutter valve qid.  --Will check CXR due to decreased BS/SOB and ice chips.   --Continue to taper prednisone. --Duoneb prn 11: Alcohol abuse: cessation counseling. Off phenobarb. Check Thiamine level.  --continue B12, thiamine and folic acid 12: Tobacco abuse: cessation counseling --continue nicotine patch 13: Hyperlipidemia: LDL- 192 and Trig- 499-->continue Crestor 40 mg 14: AKI: Baseline SCr- 1.36-->2.39-->1.57  --Encourage --drinking honey liquids but not touching puree's.              --continue water flushes qid via cortak --Recheck renal status in am.  15. Vitamin C deficiency: On daily supplement 16. Vitamin D deficiency: Continue weekly supplement.   17. 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

## 2022-01-01 NOTE — Progress Notes (Signed)
Patient ID: Ronnie Jones, male   DOB: April 02, 1951, 71 y.o.   MRN: [id number] Met with the patient and wife to review rehab process, team conference and plan of care. Reviewed secondary risks including HTN, HLD (LDL192/Trig 213) DM (A1C 6.1) and smoking. Discussed dietary modifications; wife reported patient eats well for breakfast, has [employer] for lunch and likes rice with meal for dinner and does not usually snack at night. Reviewed decreasing breads, butter and sauces and increased veg and protein intake with food choices. Patient likes to drink diet mountain dew. Reviewed team conference and deferred equipment recommendations to therapists. Wife researching resources available for equipment needed. Continue to follow along to discharge to address educational needs to facilitate preparation for discharge home. Margarito Liner

## 2022-01-01 NOTE — IPOC Note (Signed)
Overall Plan of Care [facility]) Patient Details Name: Ronnie Jones MRN: [id number] DOB: [birth year]  Admitting Diagnosis: Intraparenchymal hemorrhage of brain [facility])  Hospital Problems: Principal Problem:   Intraparenchymal hemorrhage of brain (HCC) Active Problems:   Aspiration pneumonia (HCC)   COPD with acute exacerbation (HCC)   Dysphagia   ICH (intracerebral hemorrhage) (HCC)     Functional Problem List: Nursing Bladder, Endurance, Pain, Safety, Medication Management, Bowel, Nutrition  PT Balance, Behavior, Edema, Endurance, Motor, Pain, Nutrition, Perception, Safety, Sensory, Skin Integrity  OT Balance, Cognition, Edema, Endurance, Motor, Pain, Nutrition, Perception, Safety, Sensory, Skin Integrity, Vision  SLP Cognition, Nutrition, Motor, Safety, Sensory  TR         Basic ADL's: OT Eating, Grooming, Bathing, Dressing, Toileting     Advanced  ADL's: OT       Transfers: PT Bed Mobility, Bed to Chair, Car, Oncologist: PT Ambulation, Psychologist, prison and probation services, Stairs     Additional Impairments: OT Fuctional Use of Upper Extremity  SLP Swallowing, Communication, Social Cognition expression Memory, Attention, Problem Solving, Awareness  TR      Anticipated Outcomes Item Anticipated Outcome  Self Feeding Set up A  Swallowing  sup-to-min A   Basic self-care  Min A  Toileting  Min A   Bathroom Transfers Min A  Bowel/Bladder  manage bowel w mod I and bladder w toileting, Mod I  Transfers  min assist to and from [facility]  Locomotion  supervision assist WC mobility. mod assist gait with PT  Communication  min A  Cognition  min-to-mod A  Pain  at or below level 4 with prns  Safety/Judgment  maintain safety w cues   Therapy Plan: PT Intensity: Minimum of 1-2 x/day ,45 to 90 minutes PT Frequency: 5 out of 7 days PT Duration Estimated Length of Stay: 3.5-4 weeks OT Intensity: Minimum of 1-2 x/day, 45 to 90 minutes OT Frequency: 5 out of 7  days OT Duration/Estimated Length of Stay: 3-3.5 weeks SLP Intensity: Minumum of 1-2 x/day, 30 to 90 minutes SLP Frequency: 3 to 5 out of 7 days SLP Duration/Estimated Length of Stay: 3.5-4 weeks   Team Interventions: Nursing Interventions Bladder Management, Disease Management/Prevention, Medication Management, Discharge Planning, Pain Management, Bowel Management, Patient/Family Education, Dysphagia/Aspiration Precaution Training  PT interventions Ambulation/gait training, Discharge planning, Functional mobility training, Psychosocial support, Therapeutic Activities, Visual/perceptual remediation/compensation, Balance/vestibular training, Disease management/prevention, Neuromuscular re-education, Skin care/wound management, Therapeutic Exercise, Wheelchair propulsion/positioning, Cognitive remediation/compensation, DME/adaptive equipment instruction, Pain management, Splinting/orthotics, UE/LE Strength taining/ROM, Community reintegration, Development worker, international aid stimulation, Equities trader education, Museum/gallery curator, UE/LE Coordination activities  OT Interventions Warden/ranger, Functional electrical stimulation, Discharge planning, Pain management, Self Care/advanced ADL retraining, Therapeutic Activities, UE/LE Coordination activities, Cognitive remediation/compensation, Functional mobility training, Patient/family education, Therapeutic Exercise, Visual/perceptual remediation/compensation, DME/adaptive equipment instruction, Neuromuscular re-education, Psychosocial support, Splinting/orthotics, UE/LE Strength taining/ROM, Wheelchair propulsion/positioning  SLP Interventions Cognitive remediation/compensation, Environmental controls, Internal/external aids, Cueing hierarchy, Dysphagia/aspiration precaution training, Functional tasks, Patient/family education, Therapeutic Activities  TR Interventions    SW/CM Interventions     Barriers to Discharge MD  Medical stability and Nutritional  means  Nursing Decreased caregiver support, Home environment access/layout, Medication compliance 1 level 2 ste bil rails w spouse  PT Inaccessible home environment, Home environment access/layout, Incontinence, Insurance for SNF coverage, Behavior, Nutrition means    OT Home environment access/layout, Incontinence, Nutrition means    SLP      SW       Team Discharge Planning: Destination: PT-Home ,OT- Home ,  SLP-Home Projected Follow-up: PT-Home health PT, OT-  Home health OT, 24 hour supervision/assistance, SLP-24 hour supervision/assistance, Home Health SLP, Outpatient SLP Projected Equipment Needs: PT-Rolling walker with 5" wheels, Sliding board, Wheelchair (measurements), Wheelchair cushion (measurements), To be determined, OT- To be determined, SLP-To be determined Equipment Details: PT- , OT-  Patient/family involved in discharge planning: PT- Patient, Family Adult nurse,  OT-Patient, SLP-Patient, Family member/caregiver  MD ELOS: 21-23d Medical Rehab Prognosis:  Fair Assessment: The patient has been admitted for CIR therapies with the diagnosis of Left Intra parenchymal hemorrhage. The team will be addressing functional mobility, strength, stamina, balance, safety, adaptive techniques and equipment, self-care, bowel and bladder mgt, patient and caregiver education, DVT, Dysphagia. Goals have been set at Min A. Anticipated discharge destination is Home.        See Team Conference Notes for weekly updates to the plan of care

## 2022-01-01 NOTE — Plan of Care (Signed)
  Problem: RH Balance Goal: LTG Patient will maintain dynamic sitting balance (PT) Description: LTG:  Patient will maintain dynamic sitting balance with assistance during mobility activities (PT) Flowsheets (Taken 01/01/2022 2313) LTG: Pt will maintain dynamic sitting balance during mobility activities with:: Supervision/Verbal cueing Goal: LTG Patient will maintain dynamic standing balance (PT) Description: LTG:  Patient will maintain dynamic standing balance with assistance during mobility activities (PT) Flowsheets (Taken 01/01/2022 2313) LTG: Pt will maintain dynamic standing balance during mobility activities with:: Moderate Assistance - Patient 50 - 74%   Problem: Sit to Stand Goal: LTG:  Patient will perform sit to stand with assistance level (PT) Description: LTG:  Patient will perform sit to stand with assistance level (PT) Flowsheets (Taken 01/01/2022 2313) LTG: PT will perform sit to stand in preparation for functional mobility with assistance level: Moderate Assistance - Patient 50 - 74%   Problem: RH Bed Mobility Goal: LTG Patient will perform bed mobility with assist (PT) Description: LTG: Patient will perform bed mobility with assistance, with/without cues (PT). Flowsheets (Taken 01/01/2022 2313) LTG: Pt will perform bed mobility with assistance level of: Minimal Assistance - Patient > 75%   Problem: RH Bed to Chair Transfers Goal: LTG Patient will perform bed/chair transfers w/assist (PT) Description: LTG: Patient will perform bed to chair transfers with assistance (PT). Flowsheets (Taken 01/01/2022 2313) LTG: Pt will perform Bed to Chair Transfers with assistance level: Minimal Assistance - Patient > 75%   Problem: RH Car Transfers Goal: LTG Patient will perform car transfers with assist (PT) Description: LTG: Patient will perform car transfers with assistance (PT). Flowsheets (Taken 01/01/2022 2313) LTG: Pt will perform car transfers with assist:: Moderate Assistance -  Patient 50 - 74%   Problem: RH Ambulation Goal: LTG Patient will ambulate in controlled environment (PT) Description: LTG: Patient will ambulate in a controlled environment, # of feet with assistance (PT). Flowsheets (Taken 01/01/2022 2313) LTG: Pt will ambulate in controlled environ  assist needed:: Moderate Assistance - Patient 50 - 74% LTG: Ambulation distance in controlled environment: 46ft with LRAD   Problem: RH Wheelchair Mobility Goal: LTG Patient will propel w/c in controlled environment (PT) Description: LTG: Patient will propel wheelchair in controlled environment, # of feet with assist (PT) Flowsheets (Taken 01/01/2022 2313) LTG: Pt will propel w/c in controlled environ  assist needed:: Supervision/Verbal cueing LTG: Propel w/c distance in controlled environment: 130ft Goal: LTG Patient will propel w/c in home environment (PT) Description: LTG: Patient will propel wheelchair in home environment, # of feet with assistance (PT). Flowsheets (Taken 01/01/2022 2313) LTG: Pt will propel w/c in home environ  assist needed:: Supervision/Verbal cueing Distance: wheelchair distance in controlled environment: 50

## 2022-01-01 NOTE — Progress Notes (Signed)
Inpatient Rehabilitation  Patient information reviewed and entered into eRehab system by Karel Mowers Eliott Amparan, OTR/L.   Information including medical coding, functional ability and quality indicators will be reviewed and updated through discharge.    

## 2022-01-01 NOTE — Evaluation (Signed)
Occupational Therapy Assessment and Plan  Patient Details  Name: Ronnie Jones MRN: [id number] Date of Birth: Dec 14, 1950  OT Diagnosis: abnormal posture, acute pain, cognitive deficits, disturbance of vision, flaccid hemiplegia and hemiparesis, muscle weakness (generalized), pain in joint, and swelling of limb Rehab Potential: Rehab Potential (ACUTE ONLY): Fair ELOS: 3-3.5 weeks   Today's Date: 01/01/2022 OT Individual Time: 0802-0902 OT Individual Time Calculation (min): 60 min     Hospital Problem: Principal Problem:   Intraparenchymal hemorrhage of brain ([place]) Active Problems:   Aspiration pneumonia ([place])   COPD with acute exacerbation ([place])   Dysphagia   ICH (intracerebral hemorrhage) ([place])   Past Medical History:  Past Medical History:  Diagnosis Date   CKD (chronic kidney disease)    COPD (chronic obstructive pulmonary disease) ([place])    Diabetes ([place])    Hypertension    Tobacco abuse    Past Surgical History:  Past Surgical History:  Procedure Laterality Date   LUNG SURGERY Right     Assessment & Plan Clinical Impression:  Ronnie Jones is a 71 year old RH male with history of HTN, T2DM who presented to the ED by EMS after found by wife on  12/21/2021 with slurred speech and facial drooping. Code stroke initiated. UDS negative and ETOH <10. Evaluated by Dr. Cheral Marker. Alcohol intake approximately one pint of tequila each evening. Wife reported significant daily aspirin use; up to ten 81 mg daily. He does not give a reason for the aspirin use only stating " bad habit". On exam, RUE was flaccid with RLE weakness. CTH revealed left subcortical ICH therefore TNK not administered. CTA head/neck showed severe stenosis of proximal B-VA with bilateral carotid bifurcation and aortic atherosclerosis. Cardene infusion was added for hypertensive urgency.      Follow up CT head 05/17 showed stable left thalamic IPH with intraventricular extension but without hydrocephalus. He had  issues with ETOH withdrawal and was treated with phenobarb X 5 days for as   CIWA protcol. He was started on D3, thins but had overt aspiration requiring BIPAP overnight therefore downgraded on D1, honey and treated with nebs, IV Unasyn X 5 days and steroids for  AECOPD. 2 D echo done revealing EF 65-7% with circumferential pericardial effusion without tamponade and grade 1 DD.    MBS showed prolonged mastication with silent aspiration of thins/nectar and he was maintained on D1,honey. Po intake has been poor and cortak placed today for nutritional supplementation with  Mg/Phos level in am to monitor for refeeding syndrome. . Bleed felt to be due to hypertensive emergency and Dr. Erlinda Hong reccommended SBP<160.  Lisinopril d/c due to AKI with hyperkalemia and hydralazine titrated upwards for better control. He was also noted to have multiple vitamin deficiencies- Vit D- 17.96,  Vitamin B6 (inadequate?), Vitamin C < 0.1. He was transitioned to oral taper on 05/23. Patient transferred to CIR on 12/31/2021 .    Patient currently requires total with basic self-care skills secondary to muscle weakness, decreased cardiorespiratoy endurance, abnormal tone and decreased coordination, decreased visual acuity, decreased initiation, decreased awareness, decreased problem solving, decreased safety awareness, and decreased memory, and decreased sitting balance and hemiplegia.  Prior to hospitalization, patient could complete all self-care independently.  Patient will benefit from skilled intervention to decrease level of assist with basic self-care skills and increase independence with basic self-care skills prior to discharge home with care partner.  Anticipate patient will require 24 hour supervision and minimal physical assistance and follow up home health.  OT - End of  Session Activity Tolerance: Tolerates < 10 min activity, no significant change in vital signs Endurance Deficit: Yes Endurance Deficit Description:  slightly SOB during bed mobility OT Assessment Rehab Potential (ACUTE ONLY): Fair OT Barriers to Discharge: Home environment access/layout;Incontinence;Nutrition means OT Patient demonstrates impairments in the following area(s): Balance;Cognition;Edema;Endurance;Motor;Pain;Nutrition;Perception;Safety;Sensory;Skin Integrity;Vision OT Basic ADL's Functional Problem(s): Eating;Grooming;Bathing;Dressing;Toileting OT Transfers Functional Problem(s): Toilet OT Additional Impairment(s): Fuctional Use of Upper Extremity OT Plan OT Intensity: Minimum of 1-2 x/day, 45 to 90 minutes OT Frequency: 5 out of 7 days OT Duration/Estimated Length of Stay: 3-3.5 weeks OT Treatment/Interventions: Balance/vestibular training;Functional electrical stimulation;Discharge planning;Pain management;Self Care/advanced ADL retraining;Therapeutic Activities;UE/LE Coordination activities;Cognitive remediation/compensation;Functional mobility training;Patient/family education;Therapeutic Exercise;Visual/perceptual remediation/compensation;DME/adaptive equipment instruction;Neuromuscular re-education;Psychosocial support;Splinting/orthotics;UE/LE Strength taining/ROM;Wheelchair propulsion/positioning OT Self Feeding Anticipated Outcome(s): Set up A OT Basic Self-Care Anticipated Outcome(s): Min A OT Toileting Anticipated Outcome(s): Min A OT Bathroom Transfers Anticipated Outcome(s): Min A OT Recommendation Recommendations for Other Services: Neuropsych consult Patient destination: Home Follow Up Recommendations: Home health OT;24 hour supervision/assistance Equipment Recommended: To be determined   OT Evaluation Precautions/Restrictions  Precautions Precautions: Fall Precaution Comments: R hemiparesis Restrictions Weight Bearing Restrictions: No Home Living/Prior Functioning Home Living Available Help at Discharge: Available 24 hours/day Type of Home: House Home Access: Stairs to enter [employer]  of Steps: 2 Entrance Stairs-Rails: Right, Left, Can reach both Home Layout: One level Bathroom Shower/Tub: Tub/shower unit, Multimedia programmer: Standard Bathroom Accessibility: Yes Additional Comments: Defer to acute notes 2/2 no family member present and pt with lethargy at eval  Lives With: Spouse IADL History Homemaking Responsibilities: No Current License: Yes Education: HS Occupation: Retired Type of Occupation: retired from [employer], operational in [employer] Prior Function Level of Independence: Independent with basic ADLs, Independent with gait, Independent with homemaking with ambulation  Able to Take Stairs?: Reciprically Driving: Yes Vocation: Retired Surveyor, mining Baseline Vision/History: 1 Wears glasses (reports having readers) Ability to See in Adequate Light: 1 Impaired Patient Visual Report: No change from baseline Vision Assessment?: Vision impaired- to be further tested in functional context;Yes Eye Alignment: Within Functional Limits Tracking/Visual Pursuits: Decreased smoothness of vertical tracking;Decreased smoothness of eye movement to RIGHT superior field;Decreased smoothness of eye movement to LEFT superior field Saccades: Within functional limits Depth Perception: Undershoots Perception  Perception: Not tested Praxis Praxis: Not tested Cognition Cognition Overall Cognitive Status: Impaired/Different from baseline Arousal/Alertness: Lethargic Orientation Level: Person;Place;Situation Person: Oriented Place: Disoriented Situation: Oriented Memory: Impaired Awareness: Impaired Problem Solving: Impaired Safety/Judgment: Impaired Brief Interview for Mental Status (BIMS) Repetition of Three Words (First Attempt): 3 Temporal Orientation: Year: Missed by 2 to 5 years Temporal Orientation: Month: Accurate within 5 days Temporal Orientation: Day: Incorrect Recall: "Sock": No, could not recall Recall: "Blue": Yes, no cue required Recall: "Bed": No,  could not recall BIMS Summary Score: 8 Sensation Sensation Light Touch: Impaired Detail Light Touch Impaired Details: Absent RUE;Absent RLE Hot/Cold: Not tested Proprioception: Impaired by gross assessment Stereognosis: Not tested Coordination Gross Motor Movements are Fluid and Coordinated: No Fine Motor Movements are Fluid and Coordinated: No Finger Nose Finger Test: overshooting on LUE, unable to perform on flaccid RUE Motor  Motor Motor: Hemiplegia;Abnormal tone Motor - Skilled Clinical Observations: Flexor tone RUE > RLE  Trunk/Postural Assessment  Cervical Assessment Cervical Assessment:  (forward head) Thoracic Assessment Thoracic Assessment:  (unable to assess 2/2 lethargy for EOB sitting) Lumbar Assessment Lumbar Assessment:  (unable to assess 2/2 lethargy for EOB sitting) Postural Control Postural Control:  (unable to assess 2/2 lethargy for EOB sitting)  Balance Balance Balance Assessed:  No Extremity/Trunk Assessment RUE Assessment RUE Assessment: Exceptions to [facility] RUE Body System: Neuro Brunstrum levels for arm and hand: Arm;Hand Brunstrum level for arm: Stage II Synergy is developing Brunstrum level for hand: Stage II Synergy is developing RUE PROM (degrees) Overall PROM Right Upper Extremity: Deficits Right Shoulder Flexion: 45 Degrees Right Elbow Flexion: 120 Right Composite Finger Extension: 75% RUE Tone RUE Tone: Hypertonic LUE Assessment LUE Assessment: Within Functional Limits  Care Tool Care Tool Self Care Eating Eating activity did not occur: Refused      Oral Care  Oral care, brush teeth, clean dentures activity did not occur: Refused      Bathing Bathing activity did not occur: Refused            Upper Body Dressing(including orthotics)   What is the patient wearing?: Hospital gown only   Assist Level: Total Assistance - Patient < 25%    Lower Body Dressing (excluding footwear)   What is the patient wearing?: Incontinence  brief;Pants Assist for lower body dressing: Total Assistance - Patient < 25%    Putting on/Taking off footwear   What is the patient wearing?: Non-skid slipper socks Assist for footwear: Dependent - Patient 0%       Care Tool Toileting Toileting activity   Assist for toileting: Total Assistance - Patient < 25%     Care Tool Bed Mobility Roll left and right activity   Roll left and right assist level: Maximal Assistance - Patient 25 - 49%    Sit to lying activity        Lying to sitting on side of bed activity         Care Tool Transfers Sit to stand transfer        Chair/bed transfer         Toilet transfer Toilet transfer activity did not occur: Safety/medical concerns       Care Tool Cognition  Expression of Ideas and Wants Expression of Ideas and Wants: 3. Some difficulty - exhibits some difficulty with expressing needs and ideas (e.g, some words or finishing thoughts) or speech is not clear  Understanding Verbal and Non-Verbal Content Understanding Verbal and Non-Verbal Content: 3. Usually understands - understands most conversations, but misses some part/intent of message. Requires cues at times to understand   Memory/Recall Ability Memory/Recall Ability : None of the above were recalled   Refer to Care Plan for Long Term Goals  SHORT TERM GOAL WEEK 1 OT Short Term Goal 1 (Week 1): Pt will report for 2 consecutive sessions < 3/10 pain OT Short Term Goal 2 (Week 1): Pt will sit EOB at least 3 mins during ADL task to promote OOB tolerance OT Short Term Goal 3 (Week 1): Pt will complete 1 step of dressing task at bed level with no more than min verbal cues  Recommendations for other services: Neuropsych   Skilled Therapeutic Intervention Skilled OT intervention completed with discussion on POC, rehab goals and explanation of OT purpose. Pt received upright in bed, lethargic, but agreeable to session. Generalized un-rated pain reported, pre-medicated, offered  repositioning and rest breaks for pain reduction. Pt was verbal, however minimally engaged during eval, with eyes closed. Condom cath found on pt, with therapist removing with total A, and pt found to have incontinent BM without brief on. Pericare and dressing tasks completed at bed level secondary to lethargy and increased pain. Flexor tone and muscle spasms noted on RUE and RLE, with pt presenting with cognitive deficits as  well, with difficulty assessing baseline as no family member present. See caretool for further details on assist level with self-care tasks performed. Pt was left upright in bed, with bed alarm on and all needs in reach at end of session.   ADL ADL Eating: Unable to assess Grooming: Unable to assess Upper Body Bathing: Unable to assess Lower Body Bathing: Unable to assess Upper Body Dressing: Maximal assistance Where Assessed-Upper Body Dressing: Bed level Lower Body Dressing: Dependent Where Assessed-Lower Body Dressing: Bed level Toileting: Dependent Where Assessed-Toileting: Bed level Toilet Transfer: Unable to assess Tub/Shower Transfer: Unable to assess Tub/Shower Transfer Method: Unable to assess Social research officer, government: Unable to assess Social research officer, government Method: Unable to assess Mobility  Bed Mobility Bed Mobility: Rolling Right;Rolling Left;Scooting to [facility] Rolling Right: Maximal Assistance - Patient 25-49% Rolling Left: 2 Helpers Scooting to [facility]: 2 Helpers   Discharge Criteria: Patient will be discharged from OT if patient refuses treatment 3 consecutive times without medical reason, if treatment goals not met, if there is a change in medical status, if patient makes no progress towards goals or if patient is discharged from hospital.  The above assessment, treatment plan, treatment alternatives and goals were discussed and mutually agreed upon: by patient  Blase Mess 01/01/2022, 12:37 PM

## 2022-01-01 NOTE — Evaluation (Addendum)
Speech Language Pathology Assessment and Plan  Patient Details  Name: Ronnie Jones MRN: [id number] Date of Birth: 18-Jul-1951  SLP Diagnosis: Dysarthria;Cognitive Impairments;Speech and Language deficits;Dysphagia  Rehab Potential: Fair ELOS: 3.5-4 weeks   Today's Date: 01/01/2022 SLP Individual Time: 1000-1100 SLP Individual Time Calculation (min): 60 min  Hospital Problem: Principal Problem:   Intraparenchymal hemorrhage of brain ([place]) Active Problems:   Aspiration pneumonia ([place])   COPD with acute exacerbation ([place])   Dysphagia   ICH (intracerebral hemorrhage) ([facility])  Past Medical History:  Past Medical History:  Diagnosis Date   CKD (chronic kidney disease)    COPD (chronic obstructive pulmonary disease) ([place])    Diabetes ([place])    Hypertension    Tobacco abuse    Past Surgical History:  Past Surgical History:  Procedure Laterality Date   LUNG SURGERY Right     Assessment / Plan / Recommendation Clinical Impression Ronnie Jones is a 71 year old RH male with history of HTN, T2DM who presented to the ED by EMS after found by wife on  12/21/2021 with slurred speech and facial drooping. Code stroke initiated. Alcohol intake approximately one pint of tequila each evening. Wife reported significant daily aspirin use; up to ten 81 mg daily. He does not give a reason for the aspirin use only stating " bad habit". On exam, RUE was flaccid with RLE weakness. CTH revealed left subcortical ICH therefore TNK not administered.  Follow up CT head 05/17 showed stable left thalamic IPH with intraventricular extension but without hydrocephalus. He had issues with ETOH withdrawal and was treated with phenobarb X 5 days for as CIWA protcol. He was started on D3, thins but had overt aspiration requiring BIPAP overnight therefore downgraded on D1, honey. MBS showed prolonged mastication with silent aspiration of thins/nectar and he was maintained on D1,honey. Po intake has been poor and cortak  placed today for nutritional supplementation with Mg/Phos level in am to monitor for refeeding syndrome. Bleed felt to be due to hypertensive emergency and Dr. Erlinda Hong reccommended SBP<160.   Speech/language, cognitive-communication, swallowing assessments limited this date due to lethargy impacting alertness and functional participation. Cortrak in place for supplemental nutrition, however pt had removed last night. Spouse present and reported pt slept poorly last night. Reported intake has been poor. Breakfast tray was untouched on bedside table. Pt was oriented to self and situation, responding to other orientation questions and attempts to obtain biographical information with "I don't know". Per informal assessment, pt presented with impaired focused attention (likely exacerbated by lethargy), basic problem solving, intellectual awareness, and short-term recall. Pt presents with a moderate dysarthria impacting articulatory precision and overall intelligibility. Spouse states she understand him about 50% of the time. Pt was perceived as ~50% intelligible at the word level to this documenting therapist. Expressive/receptive language appeared grossly intake however difficult to assess further due to lethargy. Pt agreeable to perform oral care with mod A for thoroughness. SLP utilized suction to remove further residuals that were not orally expelled. Pt agreeable to consume honey thick liquids by cup with mild anterior spillage on right without awareness and without overt s/sx of aspiration. Pt unwilling to consume solids due to poor appetite. Waxing/waning alertness also increased concern for aspiration with intake. SLP recommends continuation of dys 1 diet with honey thick liquids, crushed meds, and full supervision with all intake. Pt would benefit from SLP intervention to maximize cognitive-communication, speech, and swallow functioning and overall functional independence prior to discharge.   Skilled Therapeutic  Interventions  Pt participated in informal speech/language, cognitive-communication, swallowing assessments. Please see above.  SLP Assessment  Patient will need skilled Speech Lanaguage Pathology Services during CIR admission    Recommendations  SLP Diet Recommendations: Dysphagia 1 (Puree);Honey Liquid Administration via: Cup Medication Administration: Crushed with puree Supervision: Full supervision/cueing for compensatory strategies;Staff to assist with self feeding;Trained caregiver to feed patient Compensations: Slow rate;Small sips/bites;Minimize environmental distractions;Monitor for anterior loss;Lingual sweep for clearance of pocketing Postural Changes and/or Swallow Maneuvers: Seated upright 90 degrees Oral Care Recommendations: Oral care BID;Staff/trained caregiver to provide oral care Recommendations for Other Services: Neuropsych consult Patient destination: Home Follow up Recommendations: 24 hour supervision/assistance;Home Health SLP;Outpatient SLP Equipment Recommended: To be determined    SLP Frequency 3 to 5 out of 7 days   SLP Duration  SLP Intensity  SLP Treatment/Interventions 3.5-4 weeks  Minumum of 1-2 x/day, 30 to 90 minutes  Cognitive remediation/compensation;Environmental controls;Internal/external aids;Cueing hierarchy;Dysphagia/aspiration precaution training;Functional tasks;Patient/family education;Therapeutic Activities    Pain    Prior Functioning Cognitive/Linguistic Baseline: Within functional limits Type of Home: House  Lives With: Spouse Available Help at Discharge: Available 24 hours/day Education: HS Vocation: Retired  Programmer, systems Overall Cognitive Status: Impaired/Different from baseline Arousal/Alertness: Lethargic Orientation Level: Oriented to person;Oriented to situation;Oriented to place;Disoriented to time Year:  ("I don't know") Month:  ("I don't know") Day of Week: Incorrect Attention: Focused Focused  Attention: Impaired Focused Attention Impairment: Verbal basic Sustained Attention: Impaired Sustained Attention Impairment: Verbal basic Memory: Impaired Memory Impairment: Decreased short term memory;Storage deficit;Retrieval deficit;Decreased recall of new information Decreased Short Term Memory: Verbal basic Awareness: Impaired Awareness Impairment: Intellectual impairment Problem Solving: Impaired Problem Solving Impairment: Verbal basic Safety/Judgment: Impaired  Comprehension Auditory Comprehension Overall Auditory Comprehension: Other (comment) (Difficult to assess d/t lethargy; further assessment warranted) Expression Expression Primary Mode of Expression: Verbal Verbal Expression Overall Verbal Expression: Appears within functional limits for tasks assessed Oral Motor Oral Motor/Sensory Function Overall Oral Motor/Sensory Function: Mild impairment (Difficult to assess due to lethargy) Facial ROM: Reduced right Facial Symmetry: Abnormal symmetry right Facial Strength: Reduced right Facial Sensation: Other (Comment) (pt denied changes) Lingual ROM: Reduced left;Reduced right Lingual Symmetry: Abnormal symmetry right Lingual Strength: Reduced Motor Speech Overall Motor Speech: Impaired Respiration: Within functional limits Phonation: Normal Resonance: Hypernasality (difficult to assess, however question hypernasality) Articulation: Impaired Level of Impairment: Word Intelligibility: Intelligibility reduced Word: 50-74% accurate Phrase: 25-49% accurate Sentence: 0-24% accurate Conversation: Not tested Motor Speech Errors: Inconsistent  Care Tool Care Tool Cognition Ability to hear (with hearing aid or hearing appliances if normally used Ability to hear (with hearing aid or hearing appliances if normally used): 1. Minimal difficulty - difficulty in some environments (e.g. when person speaks softly or setting is noisy)   Expression of Ideas and Wants Expression of  Ideas and Wants: 2. Frequent difficulty - frequently exhibits difficulty with expressing needs and ideas   Understanding Verbal and Non-Verbal Content Understanding Verbal and Non-Verbal Content: 2. Sometimes understands - understands only basic conversations or simple, direct phrases. Frequently requires cues to understand  Memory/Recall Ability Memory/Recall Ability : None of the above were recalled   PMSV Assessment  PMSV Trial Intelligibility: Intelligibility reduced Word: 50-74% accurate Phrase: 25-49% accurate Sentence: 0-24% accurate Conversation: Not tested  Bedside Swallowing Assessment General Date of Onset: 11/21/21 Previous Swallow Assessment: MBS on 5/19 Diet Prior to this Study: Dysphagia 1 (puree);Honey-thick liquids Temperature Spikes Noted: No Respiratory Status: Room air History of Recent Intubation: No Behavior/Cognition: Alert;Cooperative;Requires cueing Oral Cavity - Dentition: Missing dentition;Dentures, top  Self-Feeding Abilities: Needs assist Patient Positioning: Upright in bed Baseline Vocal Quality: Low vocal intensity Volitional Cough: Cognitively unable to elicit Volitional Swallow: Unable to elicit  Oral Care Assessment Does patient have any of the following "high(er) risk" factors?: Diet - patient on tube feedings Does patient have any of the following "at risk" factors?: Diet - patient on thickened liquids;Saliva - thick, dry mouth Patient is HIGH RISK: Non-ventilated: Order set for Adult Oral Care Protocol initiated - "High Risk Patients - Non-Ventilated" option selected  (see row information) Patient is AT RISK: Order set for Adult Oral Care Protocol initiated -  "At Risk Patients" option selected (see row information) Ice Chips Ice chips: Not tested Thin Liquid Thin Liquid: Not tested Nectar Thick Nectar Thick Liquid: Not tested Honey Thick Honey Thick Liquid: Impaired Presentation: Cup Oral Phase Impairments: Reduced labial  seal Puree Puree: Not tested Solid Solid: Not tested BSE Assessment Risk for Aspiration Impact on safety and function: Moderate aspiration risk Other Related Risk Factors: Lethargy;Cognitive impairment  Short Term Goals: Week 1: SLP Short Term Goal 1 (Week 1): Patient will consume current diet without overt s/sx of aspiration with mod A verbal cues for use of swallowing compensatory strategies SLP Short Term Goal 2 (Week 1): Patient will be oriented x4 with mod A for use of external aids SLP Short Term Goal 3 (Week 1): Patient will implement speech intelligbility strategies at the word level with mod A verbal cues SLP Short Term Goal 4 (Week 1): Patient will complete basic problem solving with mod A verval/visual cues SLP Short Term Goal 5 (Week 1): Patient will exhibit recall of recent information with mod A verbal/visual cues for use of external aids SLP Short Term Goal 6 (Week 1): Patient will participate in further cognitive-linguistic evaluation  Refer to Care Plan for Long Term Goals  Recommendations for other services: Neuropsych  Discharge Criteria: Patient will be discharged from SLP if patient refuses treatment 3 consecutive times without medical reason, if treatment goals not met, if there is a change in medical status, if patient makes no progress towards goals or if patient is discharged from hospital.  The above assessment, treatment plan, treatment alternatives and goals were discussed and mutually agreed upon: by patient and by family  Patty Sermons 01/01/2022, 4:00 PM

## 2022-01-01 NOTE — Evaluation (Signed)
Physical Therapy Assessment and Plan  Patient Details  Name: Sevastian Whiston MRN: [id number] Date of Birth: 09/03/1950  PT Diagnosis: Abnormal posture, Abnormality of gait, Coordination disorder, Hemiparesis dominant, Impaired cognition, Impaired sensation, and Muscle weakness Rehab Potential: Good ELOS: 3.5-4 weeks   Today's Date: 01/01/2022 PT Individual Time: 1305-1415 PT Individual Time Calculation (min): 70 min    Hospital Problem: Principal Problem:   Intraparenchymal hemorrhage of brain (HCC) Active Problems:   Aspiration pneumonia (HCC)   COPD with acute exacerbation (HCC)   Dysphagia   ICH (intracerebral hemorrhage) (HCC)   Past Medical History:  Past Medical History:  Diagnosis Date   CKD (chronic kidney disease)    COPD (chronic obstructive pulmonary disease) (HCC)    Diabetes (HCC)    Hypertension    Tobacco abuse    Past Surgical History:  Past Surgical History:  Procedure Laterality Date   LUNG SURGERY Right     Assessment & Plan Clinical Impression: Patient is a 71-year-old RH male with history of HTN, T2DM who presented to the ED by EMS after found by wife on  12/21/2021 with slurred speech and facial drooping. Code stroke initiated. UDS negative and ETOH <10. Evaluated by Dr. Lindzen. Alcohol intake approximately one pint of tequila each evening. Wife reported significant daily aspirin use; up to ten 81 mg daily. He does not give a reason for the aspirin use only stating " bad habit". On exam, RUE was flaccid with RLE weakness. CTH revealed left subcortical ICH therefore TNK not administered. CTA head/neck showed severe stenosis of proximal B-VA with bilateral carotid bifurcation and aortic atherosclerosis. Cardene infusion was added for hypertensive urgency.      Follow up CT head 05/17 showed stable left thalamic IPH with intraventricular extension but without hydrocephalus. He had issues with ETOH withdrawal and was treated with phenobarb X 5 days for as    CIWA protcol. He was started on D3, thins but had overt aspiration requiring BIPAP overnight therefore downgraded on D1, honey and treated with nebs, IV Unasyn X 5 days and steroids for  AECOPD. 2 D echo done revealing EF 65-7% with circumferential pericardial effusion without tamponade and grade 1 DD.    MBS showed prolonged mastication with silent aspiration of thins/nectar and he was maintained on D1,honey. Po intake has been poor and cortak placed today for nutritional supplementation with  Mg/Phos level in am to monitor for refeeding syndrome. . Bleed felt to be due to hypertensive emergency and Dr. Xu reccommended SBP<160.  Lisinopril d/c due to AKI with hyperkalemia and hydralazine titrated upwards for better control.   Patient transferred to CIR on 12/31/2021 .   Patient currently requires total with mobility secondary to muscle weakness, muscle joint tightness, and muscle paralysis, decreased cardiorespiratoy endurance, abnormal tone, unbalanced muscle activation, and decreased coordination, decreased attention to right, decreased attention, decreased awareness, decreased problem solving, decreased safety awareness, decreased memory, and delayed processing, and decreased sitting balance, decreased standing balance, decreased postural control, hemiplegia, and decreased balance strategies.  Prior to hospitalization, patient was independent  with mobility and lived with Spouse in a House home.  Home access is 2Stairs to enter.  Patient will benefit from skilled PT intervention to maximize safe functional mobility, minimize fall risk, and decrease caregiver burden for planned discharge home with 24 hour assist.  Anticipate patient will benefit from follow up HH at discharge.  PT - End of Session Activity Tolerance: Tolerates < 10 min activity, no significant change in vital signs Endurance   Deficit: Yes PT Assessment Rehab Potential (ACUTE/IP ONLY): Good PT Barriers to Discharge: Inaccessible home  environment;Home environment access/layout;Incontinence;Insurance for SNF coverage;Behavior;Nutrition means PT Patient demonstrates impairments in the following area(s): Balance;Behavior;Edema;Endurance;Motor;Pain;Nutrition;Perception;Safety;Sensory;Skin Integrity PT Transfers Functional Problem(s): Bed Mobility;Bed to Chair;Car;Furniture PT Locomotion Functional Problem(s): Ambulation;Wheelchair Mobility;Stairs PT Plan PT Intensity: Minimum of 1-2 x/day ,45 to 90 minutes PT Frequency: 5 out of 7 days PT Duration Estimated Length of Stay: 3.5-4 weeks PT Treatment/Interventions: Ambulation/gait training;Discharge planning;Functional mobility training;Psychosocial support;Therapeutic Activities;Visual/perceptual remediation/compensation;Balance/vestibular training;Disease management/prevention;Neuromuscular re-education;Skin care/wound management;Therapeutic Exercise;Wheelchair propulsion/positioning;Cognitive remediation/compensation;DME/adaptive equipment instruction;Pain management;Splinting/orthotics;UE/LE Strength taining/ROM;Community reintegration;Functional electrical stimulation;Patient/family education;Stair training;UE/LE Coordination activities PT Transfers Anticipated Outcome(s): min assist to and from [facility] PT Locomotion Anticipated Outcome(s): supervision assist WC mobility. mod assist gait with PT PT Recommendation Follow Up Recommendations: Home health PT Patient destination: Home Equipment Recommended: Rolling walker with 5" wheels;Sliding board;Wheelchair (measurements);Wheelchair cushion (measurements);To be determined   PT Evaluation Precautions/Restrictions   Fall  General   Vital SignsTherapy Vitals Temp: 98 F (36.7 C) Pulse Rate: 80 Resp: 18 BP: 140/67 Patient Position (if appropriate): Sitting Oxygen Therapy SpO2: 94 % O2 Device: Room Air Pain   denies Pain Interference Pain Interference Pain Effect on Sleep: 1. Rarely or not at all Pain Interference with  Therapy Activities: 1. Rarely or not at all Pain Interference with Day-to-Day Activities: 1. Rarely or not at all Home Living/Prior [place] Available Help at Discharge: Available 24 hours/day Type of Home: House Home Access: Stairs to enter [employer] of Steps: 2 Entrance Stairs-Rails: Right;Left;Can reach both Home Layout: One level Bathroom Shower/Tub: Tub/shower unit;Walk-in shower Bathroom Toilet: Standard Bathroom Accessibility: Yes  Lives With: Spouse Prior Function Level of Independence: Independent with basic ADLs;Independent with gait;Independent with homemaking with ambulation  Able to Take Stairs?: Reciprically Driving: Yes Vocation: Retired Vision/Perception  Vision - History Ability to See in Adequate Light: 1 Impaired Vision - Assessment Eye Alignment: Within Functional Limits Alignment/Gaze Preference: Within Defined Limits Tracking/Visual Pursuits: Requires cues, head turns, or add eye shifts to track Saccades: Within functional limits Additional Comments: mild R inattention Perception Perception: Impaired Inattention/Neglect: Does not attend to right side of body;Does not attend to right visual field Praxis Praxis: Intact  Cognition Overall Cognitive Status: Impaired/Different from baseline Arousal/Alertness: Lethargic Memory: Impaired Awareness: Impaired Problem Solving: Impaired Safety/Judgment: Impaired Sensation Sensation Light Touch: Impaired Detail Light Touch Impaired Details: Absent RUE;Absent RLE Proprioception: Impaired Detail Proprioception Impaired Details: Impaired RLE;Impaired RUE Stereognosis: Impaired by gross assessment Coordination Gross Motor Movements are Fluid and Coordinated: No Fine Motor Movements are Fluid and Coordinated: No Coordination and Movement Description: dense R sided hemiplegia Finger Nose Finger Test: unable to perform on the R, mild dysmetria on the LUE Heel Shin Test: unable to  perform on the R, mild dysmetria on the LLE Motor  Motor Motor: Hemiplegia;Abnormal tone;Abnormal postural alignment and control Motor - Skilled Clinical Observations: Flexor tone RUE > RLE   Trunk/Postural Assessment  Cervical Assessment Cervical Assessment: Exceptions to [facility] (forward head) Thoracic Assessment Thoracic Assessment: Exceptions to [facility] (unable to assess 2/2 lethargy for EOB sitting) Lumbar Assessment Lumbar Assessment: Exceptions to [facility] (unable to assess 2/2 lethargy for EOB sitting) Postural Control Postural Control: Deficits on evaluation (unable to assess 2/2 lethargy for EOB sitting)  Balance Balance Balance Assessed: Yes Static Sitting Balance Static Sitting - Level of Assistance: 4: Min assist Dynamic Sitting Balance Dynamic Sitting - Level of Assistance: 3: Mod assist;2: Max assist Static Standing Balance Static Standing - Level of Assistance: 2: Max assist  Dynamic Standing Balance Dynamic Standing - Level of Assistance: 2: Max assist;1: +1 Total assist Extremity Assessment      RLE Assessment RLE Assessment: Exceptions to WFL General Strength Comments: 2/5 knee extension and hip flexion. 2-/5 hip extension and knee flexion LLE Assessment LLE Assessment: Exceptions to WFL General Strength Comments: 4-/5 proximal to distal  Care Tool Care Tool Bed Mobility Roll left and right activity   Roll left and right assist level: Maximal Assistance - Patient 25 - 49%    Sit to lying activity   Sit to lying assist level: Total Assistance - Patient < 25%    Lying to sitting on side of bed activity   Lying to sitting on side of bed assist level: the ability to move from lying on the back to sitting on the side of the bed with no back support.: Total Assistance - Patient < 25%     Care Tool Transfers Sit to stand transfer   Sit to stand assist level: 2 Helpers    Chair/bed transfer   Chair/bed transfer assist level: Dependent - mechanical lift     Toilet  transfer Toilet transfer activity did not occur: Safety/medical concerns      Car transfer Car transfer activity did not occur: Safety/medical concerns        Care Tool Locomotion Ambulation Ambulation activity did not occur: Safety/medical concerns        Walk 10 feet activity Walk 10 feet activity did not occur: Safety/medical concerns       Walk 50 feet with 2 turns activity Walk 50 feet with 2 turns activity did not occur: Safety/medical concerns      Walk 150 feet activity Walk 150 feet activity did not occur: Safety/medical concerns      Walk 10 feet on uneven surfaces activity Walk 10 feet on uneven surfaces activity did not occur: Safety/medical concerns      Stairs Stair activity did not occur: Safety/medical concerns        Walk up/down 1 step activity Walk up/down 1 step or curb (drop down) activity did not occur: Safety/medical concerns      Walk up/down 4 steps activity Walk up/down 4 steps activity did not occur: Safety/medical concerns      Walk up/down 12 steps activity Walk up/down 12 steps activity did not occur: Safety/medical concerns      Pick up small objects from floor Pick up small object from the floor (from standing position) activity did not occur: Safety/medical concerns      Wheelchair Is the patient using a wheelchair?: Yes Type of Wheelchair: Manual   Wheelchair assist level: Dependent - Patient 0% Max wheelchair distance: 150  Wheel 50 feet with 2 turns activity   Assist Level: Dependent - Patient 0%  Wheel 150 feet activity   Assist Level: Dependent - Patient 0%    Refer to Care Plan for Long Term Goals  SHORT TERM GOAL WEEK 1 PT Short Term Goal 1 (Week 1): Pt will tolerate sitting in WC >2 hours between therapy PT Short Term Goal 2 (Week 1): Pt will transfer to Bed with max assist of 1 consistently PT Short Term Goal 3 (Week 1): Pt will ambulate 15ft with max assist + 2  Recommendations for other services: None   Skilled  Therapeutic Intervention Mobility Bed Mobility Bed Mobility: Rolling Right;Rolling Left;Scooting to HOB;Supine to Sit;Sit to Supine Rolling Right: Maximal Assistance - Patient 25-49% Rolling Left: Total Assistance - Patient < 25% Supine to   Sit: Total Assistance - Patient < 25% Sit to Supine: Total Assistance - Patient < 25% Transfers Transfers: Sit to [employer] via Geophysicist/field seismologist Sit to Stand: Maximal Assistance - Patient 25-49% (at rail in hall) Transfer via [place]: Animal nutritionist: Yes Gait Assistance: 2 Helpers Social research officer, government (Feet): 6 Feet Assistive device: Other (Comment) (rail in hall) Gait Gait: Yes Gait Pattern: Impaired Gait Pattern: Right flexed knee in stance;Trunk flexed;Lateral hip instability Stairs / Additional Locomotion Stairs: No Wheelchair Mobility Wheelchair Mobility: Yes Wheelchair Assistance: Dependent - Patient 0% Distance: TIS WC. 140f  Pt received supine in bed and agreeable to PT. Supine>sit transfer with max-total assist and cues for attention to the R side. [facility]max assist with LUE on support bar. PT instructed patient in PT Evaluation and initiated treatment intervention; see above for results. PT educated patient in [place] re[place] potential, rehab goals, and discharge recommendations along with recommendation for follow-up rehabilitation services.  Gait training at rail in hall with +2 assist with PT blocking RLE, but noted to have active knee extension and advancement with assist. Pt returned to room and performed stedy transfer to bed with max assist. Sit>supine completed with max assist, and left supine in bed with call bell in reach and all needs met.     Discharge Criteria: Patient will be discharged from PT if patient refuses treatment 3 consecutive times without medical reason, if treatment goals not met, if there is a change in medical status, if patient makes no progress towards goals or if  patient is discharged from hospital.  The above assessment, treatment plan, treatment alternatives and goals were discussed and mutually agreed upon: by patient and by family  ALorie Phenix5/26/2023, 11:09 PM

## 2022-01-01 NOTE — Procedures (Signed)
Cortrak Tube Team Note:  Consult received to reposition Cortrak feeding tube. Cortrak initially placed on 5/24 and bridled at 68 cm; Cortrak inadvertently pulled out some overnight, abd xray ordered and indicated tube terminating at GE junction and need to be advanced,   RD successfully re-advanced tube, Cortrak now bridled at 69 cm. LDA updated.   X-ray is required, abdominal x-ray has been ordered by the Cortrak team. Please confirm tube placement before using the Cortrak tube.   If the tube becomes dislodged please keep the tube and contact the Cortrak team at [email or web address] (password [email or web address]) for replacement.  If after hours and replacement cannot be delayed, place a NG tube and confirm placement with an abdominal x-ray.   Romelle Starcher MS, RDN, LDN, CNSC Registered Dietitian III Clinical Nutrition RD Pager and On-Call Pager Number Located in [place]

## 2022-01-01 NOTE — Progress Notes (Signed)
Initial Nutrition Assessment  DOCUMENTATION CODES:   Not applicable  INTERVENTION:   Tube Feeding via Cortrak: Continue nocturnal TF for now. If po intake remains inadequate, need to resume continuous TF as pt is a high nutrition risk Jevity 1.5 at 80 ml/hr X 14 hours Pro-Source TF 45 mL BID This provides 1760 kcals, 93 g of protein and 851 ml of free water   Given patient on Honey Thick Liquids and taking in very little, recommend meeting hydration needs via feeding tube at this time. Free water flush of 200 mL q 4 hours: total free water from TF and scheduled flushes is 2051 mL water  Increase Vitamin C to 500 mg BID given signs/symptoms of Vitamin C deficiency on exam with very low serum lav values  Continue Vitamin D, thiamine, folic acid  Continue Magic cup TID with meals, each supplement provides 290 kcal and 9 grams of protein   NUTRITION DIAGNOSIS:   Inadequate oral intake related to poor appetite, chronic illness, acute illness as evidenced by meal completion < 25%, per patient/family report.  GOAL:   Patient will meet greater than or equal to 90% of their needs  MONITOR:   TF tolerance, PO intake, Supplement acceptance, Labs, Weight trends  REASON FOR ASSESSMENT:   New TF    ASSESSMENT:   71 yo male admitted to CIR after hospitalization for left thalamic/basal ganglia intracerebral hemorrhage of the brain with intraventricular extension. PMH includes COPD, HTN, EtOH abuse (1 pint of tequila daily), CKD, DM, vitamin deficiencies including Vit D and Vit C.  Pt very sleepy on visit today; arousable but then falls back asleep.  Pt did not touch breakfast this AM; ate bites yesterday despite max encouragement from wife.  Pt currently on Honey Thick Diet, Wife reports patient only wants ice chips, italian ice and ice cream-none of which are Honey Thick. Discouraged intake of this currently  Wife reports pt drinks around a pint of tequila per day and has been for  years (more than a decade). Pt begins drinking around 430pm in the afternoon and it continues until he passes out asleep. PO intake at home had been great as previously noted in RD assessments; however, po intake while inpatient has been minimal  Wife reports mobility wise, pt gets around but noticed recently that pt was dragging one foot behind him, kinda limping around PTA to hospitalization  Vitamin/Mineral Labs (12/24/21): Vitamin A 91.8 (high) Vitamin C <0.1 (low) Vitamin D 17.96 (low) Vitamin B6 inconclusive Vitamin B12 703 Zinc 44 Copper 118    NUTRITION - FOCUSED PHYSICAL EXAM:  Flowsheet Row Most Recent Value  Orbital Region No depletion  Upper Arm Region Mild depletion  Thoracic and Lumbar Region No depletion  Buccal Region No depletion  Temple Region No depletion  Clavicle Bone Region No depletion  Clavicle and Acromion Bone Region No depletion  Scapular Bone Region No depletion  Dorsal Hand Moderate depletion  Patellar Region Mild depletion  Anterior Thigh Region Unable to assess  [wearning pants]  Posterior Calf Region Moderate depletion  Edema (RD Assessment) None  Hair Other (Comment)  [corkscrew hairs on arms]  Eyes Unable to assess  Mouth Other (Comment)  [pale gums, extremely poor dentition, black teeth, dry tongue]  Skin Other (Comment)  [bruises very easily per wife]       Diet Order:   Diet Order             DIET - DYS 1 Room service appropriate? No; Fluid  consistency: Honey Thick  Diet effective now                   EDUCATION NEEDS:   Not appropriate for education at this time  Skin:  Skin Assessment: Skin Integrity Issues: Skin Integrity Issues:: Other (Comment) Other: skin tear: vertebral column  Last BM:  5/26  Height:   Ht Readings from Last 1 Encounters:  12/31/21 5\' 7"  (1.702 m)    Weight:   Wt Readings from Last 1 Encounters:  01/01/22 77.1 kg     BMI:  Body mass index is 26.62 kg/m.  Estimated Nutritional  Needs:   Kcal:  1900-2100 kcals  Protein:  100-110 g  Fluid:  >/1.9 L   01/03/22 MS, RDN, LDN, CNSC Registered Dietitian III Clinical Nutrition RD Pager and On-Call Pager Number Located in [place]

## 2022-01-01 NOTE — Progress Notes (Signed)
Called the number for [employer] left voice message.

## 2022-01-01 NOTE — Progress Notes (Addendum)
Ronnie Life RN advised that patients feeding tube was dislodged, tube needed to be advanced. Messaged Kendell Bane RD to see if she could place tube. Waiting on response. Ronnie Jones is on for Cortrak today [phone].

## 2022-01-01 NOTE — Progress Notes (Signed)
ANTICOAGULATION CONSULT NOTE - Initial Consult  Pharmacy Consult for Heparin Indication: DVT  Allergies  Allergen Reactions   Codeine Nausea Only    Patient Measurements: Height: 5\' 7"  (170.2 cm) Weight: 77.1 kg (169 lb 15.6 oz) IBW/kg (Calculated) : 66.1 Heparin Dosing Weight: 77 kg  Vital Signs: Temp: 97.6 F (36.4 C) (05/26 1438) BP: 131/78 (05/26 1438) Pulse Rate: 80 (05/26 1438)  Labs: Recent Labs    12/30/21 0412 01/01/22 0523  HGB  --  13.9  HCT  --  41.2  PLT  --  188  CREATININE 1.57* 1.36*    Estimated Creatinine Clearance: 48.6 mL/min (A) (by C-G formula based on SCr of 1.36 mg/dL (H)).   Medical History: Past Medical History:  Diagnosis Date   CKD (chronic kidney disease)    COPD (chronic obstructive pulmonary disease) (HCC)    Diabetes (HCC)    Hypertension    Tobacco abuse     Medications:  Scheduled:   amLODipine  10 mg Oral Daily   vitamin C  500 mg Oral BID   atenolol  50 mg Oral Daily   chlorhexidine  15 mL Mouth Rinse BID   enoxaparin (LOVENOX) injection  40 mg Subcutaneous Q24H   feeding supplement (JEVITY 1.5 CAL/FIBER)  1,000 mL Per Tube Daily   feeding supplement (PROSource TF)  45 mL Per Tube BID   folic acid  1 mg Oral Daily   free water  100 mL Per Tube TID WC & HS   hydrALAZINE  100 mg Oral Q8H   insulin aspart  0-5 Units Subcutaneous QHS   insulin aspart  0-9 Units Subcutaneous TID WC   melatonin  3 mg Oral QHS   nicotine  21 mg Transdermal Daily   pantoprazole sodium  40 mg Per Tube Daily   predniSONE  30 mg Oral Q breakfast   Followed by   Derrill Memo ON 01/03/2022] predniSONE  20 mg Oral Q breakfast   Followed by   Derrill Memo ON 01/06/2022] predniSONE  10 mg Oral Q breakfast   Followed by   Derrill Memo ON 01/09/2022] predniSONE  5 mg Oral Q breakfast   rosuvastatin  40 mg Oral QHS   senna-docusate  1 tablet Oral BID   thiamine  100 mg Oral Daily   tiZANidine  2 mg Oral TID   umeclidinium-vilanterol  1 puff Inhalation Daily    Vitamin D (Ergocalciferol)  50,000 Units Oral Q7 days    Assessment: 71 y.o. M known to pharmacy from rehab admission - pt with intraparenchymal hemorrhage 5/15. Pt found to have unprovoked brachial DVT. Spoke with rehab PA Olin Hauser Love) and she d/w Dr. Leonie Man who agrees with heparin low-dose and no bolus. Plan for head CT after heparin starts to make sure intraparenchymal hemorrhage is stable. CBC stable.  Noted pt received Lovenox 40mg  this morning ~1000.  Goal of Therapy:  Heparin level 0.3-0.5 units/ml Monitor platelets by anticoagulation protocol: Yes   Plan:  Begin heparin at 900 units/hr Will f/u 8hr heparin level Daily heparin level and CBC  Sherlon Handing, PharmD, BCPS Please see amion for complete clinical pharmacist phone list 01/01/2022,5:21 PM

## 2022-01-01 NOTE — Progress Notes (Signed)
Patient positive for DVT in right brachial vein and has a superficial DVT in basilic vein--per nursing no documented PICC or central line.  Discussed with Dr. Wynn Banker and CT head ordered to follow up on bleed.  He has 2-3+ edema and discussed results with Dr. Pearlean Brownie who felt that  patient with moderate size bleed and only 10+ days out. He recommended IV heparin without bolus and to monitor for stability while in rehab. If stable, may be able to transition to DOAC at discharge.  Patient and wife updated on above.

## 2022-01-02 DIAGNOSIS — I619 Nontraumatic intracerebral hemorrhage, unspecified: Secondary | ICD-10-CM | POA: Diagnosis not present

## 2022-01-02 LAB — CBC
HCT: 41.4 % (ref 39.0–52.0)
Hemoglobin: 13.5 g/dL (ref 13.0–17.0)
MCH: 29.7 pg (ref 26.0–34.0)
MCHC: 32.6 g/dL (ref 30.0–36.0)
MCV: 91.2 fL (ref 80.0–100.0)
Platelets: 182 10*3/uL (ref 150–400)
RBC: 4.54 MIL/uL (ref 4.22–5.81)
RDW: 13.2 % (ref 11.5–15.5)
WBC: 10.3 10*3/uL (ref 4.0–10.5)
nRBC: 0 % (ref 0.0–0.2)

## 2022-01-02 LAB — GLUCOSE, CAPILLARY
Glucose-Capillary: 216 mg/dL — ABNORMAL HIGH (ref 70–99)
Glucose-Capillary: 152 mg/dL — ABNORMAL HIGH (ref 70–99)
Glucose-Capillary: 131 mg/dL — ABNORMAL HIGH (ref 70–99)
Glucose-Capillary: 199 mg/dL — ABNORMAL HIGH (ref 70–99)

## 2022-01-02 LAB — MAGNESIUM: Magnesium: 2.4 mg/dL (ref 1.7–2.4)

## 2022-01-02 LAB — HEPARIN LEVEL (UNFRACTIONATED)
Heparin Unfractionated: 0.51 IU/mL (ref 0.30–0.70)
Heparin Unfractionated: 0.37 IU/mL (ref 0.30–0.70)

## 2022-01-02 LAB — PHOSPHORUS: Phosphorus: 3.5 mg/dL (ref 2.5–4.6)

## 2022-01-02 MED ORDER — IPRATROPIUM-ALBUTEROL 0.5-2.5 (3) MG/3ML IN SOLN
3.0000 mL | Freq: Four times a day (QID) | RESPIRATORY_TRACT | Status: DC
  Administered 2022-01-02 – 2022-01-06 (×14): 3 mL via RESPIRATORY_TRACT
  Filled 2022-01-02 (×16): qty 3

## 2022-01-02 MED ORDER — PANTOPRAZOLE 2 MG/ML SUSPENSION
40.0000 mg | Freq: Every day | ORAL | Status: DC
  Administered 2022-01-03 – 2022-01-21 (×19): 40 mg via ORAL
  Filled 2022-01-02 (×17): qty 20

## 2022-01-02 MED ORDER — ALBUTEROL SULFATE (2.5 MG/3ML) 0.083% IN NEBU
INHALATION_SOLUTION | RESPIRATORY_TRACT | Status: AC
  Filled 2022-01-02: qty 3

## 2022-01-02 MED ORDER — ALBUTEROL SULFATE (2.5 MG/3ML) 0.083% IN NEBU: 2.5000 mg | INHALATION_SOLUTION | RESPIRATORY_TRACT | Status: DC | PRN

## 2022-01-02 MED ORDER — ALBUTEROL SULFATE (2.5 MG/3ML) 0.083% IN NEBU
2.5000 mg | INHALATION_SOLUTION | RESPIRATORY_TRACT | Status: DC | PRN
  Administered 2022-01-03 – 2022-01-16 (×25): 2.5 mg via RESPIRATORY_TRACT
  Filled 2022-01-02 (×28): qty 3

## 2022-01-02 NOTE — Progress Notes (Incomplete)
ANTICOAGULATION CONSULT NOTE - Follow Up Consult  Pharmacy Consult for Heparin Indication: DVT  Allergies  Allergen Reactions   Codeine Nausea Only    Patient Measurements: Height: 5\' 7"  (170.2 cm) Weight: 77.2 kg (170 lb 3.1 oz) IBW/kg (Calculated) : 66.1 kg Heparin Dosing Weight: 77 kg  Vital Signs: Temp: 97.8 F (36.6 C) (05/27 0502) BP: 156/84 (05/27 0502) Pulse Rate: 79 (05/27 0502)  Labs: Recent Labs    01/01/22 0523 01/02/22 0510  HGB 13.9 13.5  HCT 41.2 41.4  PLT 188 182  HEPARINUNFRC  --  0.51  CREATININE 1.36*  --     Estimated Creatinine Clearance: 48.6 mL/min (A) (by C-G formula based on SCr of 1.36 mg/dL (H)).   Medications:  Scheduled:   amLODipine  10 mg Oral Daily   vitamin C  500 mg Oral BID   atenolol  50 mg Oral Daily   chlorhexidine  15 mL Mouth Rinse BID   feeding supplement (JEVITY 1.5 CAL/FIBER)  1,120 mL Per Tube Q24H   feeding supplement (PROSource TF)  45 mL Per Tube BID   folic acid  1 mg Oral Daily   free water  200 mL Per Tube Q4H   hydrALAZINE  100 mg Oral Q8H   insulin aspart  0-5 Units Subcutaneous QHS   insulin aspart  0-9 Units Subcutaneous TID WC   melatonin  3 mg Oral QHS   nicotine  21 mg Transdermal Daily   pantoprazole sodium  40 mg Per Tube Daily   predniSONE  30 mg Oral Q breakfast   Followed by   01/04/22 ON 01/03/2022] predniSONE  20 mg Oral Q breakfast   Followed by   01/05/2022 ON 01/06/2022] predniSONE  10 mg Oral Q breakfast   Followed by   01/08/2022 ON 01/09/2022] predniSONE  5 mg Oral Q breakfast   rosuvastatin  40 mg Oral QHS   senna-docusate  1 tablet Oral BID   thiamine  100 mg Oral Daily   tiZANidine  2 mg Oral TID   umeclidinium-vilanterol  1 puff Inhalation Daily   Vitamin D (Ergocalciferol)  50,000 Units Oral Q7 days   Infusions:   heparin 850 Units/hr (01/02/22 0642)   PRN: acetaminophen, alum & mag hydroxide-simeth, bisacodyl, diphenhydrAMINE, guaiFENesin-dextromethorphan, ipratropium-albuterol,  magnesium hydroxide, methocarbamol, prochlorperazine **OR** prochlorperazine **OR** prochlorperazine, sodium phosphate, traZODone  Assessment: 71 yo male presents with left subcortical IPH and is now found to have an 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 PTA the patient is not on anticoagulation. Pharmacy is consulted to dose heparin.   Heparin level is ____ at __ while running at ____. Per the RN, there have been no issues with the infusion and the patient is without signs or symptoms of bleeding. CT head post-heparin initiation does not show worsening of IPH. CBC is WNL and stable.   Goal of Therapy:  Heparin level 0.3-0.5 units/ml - no bolus Monitor platelets by anticoagulation protocol: Yes   Plan:    Monitor for signs and symptoms of bleeding Obtain a daily heparin level and CBC  73, PharmD, RPh  PGY-2 Pharmacy Resident 01/02/2022 7:27 AM  Please check [email or web address] for unit-specific pharmacy phone numbers.

## 2022-01-02 NOTE — Progress Notes (Signed)
ANTICOAGULATION CONSULT NOTE    Pharmacy Consult for Heparin Indication: DVT  Allergies  Allergen Reactions   Codeine Nausea Only    Patient Measurements: Height: 5\' 7"  (170.2 cm) Weight: 77.2 kg (170 lb 3.1 oz) IBW/kg (Calculated) : 66.1 Heparin Dosing Weight: 77 kg  Vital Signs: Temp: 98.4 F (36.9 C) (05/27 1343) BP: 167/84 (05/27 1343) Pulse Rate: 80 (05/27 1343)  Labs: Recent Labs    01/01/22 0523 01/02/22 0510 01/02/22 1556  HGB 13.9 13.5  --   HCT 41.2 41.4  --   PLT 188 182  --   HEPARINUNFRC  --  0.51 0.37  CREATININE 1.36*  --   --      Estimated Creatinine Clearance: 48.6 mL/min (A) (by C-G formula based on SCr of 1.36 mg/dL (H)).   Medical History: Past Medical History:  Diagnosis Date   CKD (chronic kidney disease)    COPD (chronic obstructive pulmonary disease) (HCC)    Diabetes (HCC)    Hypertension    Tobacco abuse     Medications:  Scheduled:   amLODipine  10 mg Oral Daily   vitamin C  500 mg Oral BID   atenolol  50 mg Oral Daily   chlorhexidine  15 mL Mouth Rinse BID   feeding supplement (JEVITY 1.5 CAL/FIBER)  1,120 mL Per Tube Q24H   feeding supplement (PROSource TF)  45 mL Per Tube BID   folic acid  1 mg Oral Daily   free water  200 mL Per Tube Q4H   hydrALAZINE  100 mg Oral Q8H   insulin aspart  0-5 Units Subcutaneous QHS   insulin aspart  0-9 Units Subcutaneous TID WC   ipratropium-albuterol  3 mL Nebulization Q6H   melatonin  3 mg Oral QHS   nicotine  21 mg Transdermal Daily   pantoprazole sodium  40 mg Oral Daily   [START ON 01/03/2022] predniSONE  20 mg Oral Q breakfast   Followed by   01/05/2022 ON 01/06/2022] predniSONE  10 mg Oral Q breakfast   Followed by   01/08/2022 ON 01/09/2022] predniSONE  5 mg Oral Q breakfast   rosuvastatin  40 mg Oral QHS   senna-docusate  1 tablet Oral BID   thiamine  100 mg Oral Daily   tiZANidine  2 mg Oral TID   umeclidinium-vilanterol  1 puff Inhalation Daily   Vitamin D (Ergocalciferol)   50,000 Units Oral Q7 days    Assessment: 71 y.o. M known to pharmacy from rehab admission - pt with intraparenchymal hemorrhage 5/15. Pt found to have unprovoked brachial DVT. Spoke with rehab PA 6/15 Love) and she d/w Dr. Rinaldo Cloud who agrees with heparin low-dose and no bolus. Plan for head CT after heparin starts to make sure intraparenchymal hemorrhage is stable.   Heparin level: 0.37 now within low goal, CBC stable, no overt bleeding or infusion related issues per RN.  Goal of Therapy:  Heparin level 0.3-0.5 units/ml Monitor platelets by anticoagulation protocol: Yes   Plan:  Continue heparin at 850 units/hr Daily heparin level and CBC  Pearlean Brownie PharmD., BCPS Clinical Pharmacist 01/02/2022 4:44 PM

## 2022-01-02 NOTE — Progress Notes (Signed)
ANTICOAGULATION CONSULT NOTE    Pharmacy Consult for Heparin Indication: DVT  Allergies  Allergen Reactions   Codeine Nausea Only    Patient Measurements: Height: 5\' 7"  (170.2 cm) Weight: 77.2 kg (170 lb 3.1 oz) IBW/kg (Calculated) : 66.1 Heparin Dosing Weight: 77 kg  Vital Signs: Temp: 97.8 F (36.6 C) (05/27 0502) BP: 156/84 (05/27 0502) Pulse Rate: 79 (05/27 0502)  Labs: Recent Labs    01/01/22 0523 01/02/22 0510  HGB 13.9 13.5  HCT 41.2 41.4  PLT 188 182  HEPARINUNFRC  --  0.51  CREATININE 1.36*  --      Estimated Creatinine Clearance: 48.6 mL/min (A) (by C-G formula based on SCr of 1.36 mg/dL (H)).   Medical History: Past Medical History:  Diagnosis Date   CKD (chronic kidney disease)    COPD (chronic obstructive pulmonary disease) (HCC)    Diabetes (HCC)    Hypertension    Tobacco abuse     Medications:  Scheduled:   amLODipine  10 mg Oral Daily   vitamin C  500 mg Oral BID   atenolol  50 mg Oral Daily   chlorhexidine  15 mL Mouth Rinse BID   feeding supplement (JEVITY 1.5 CAL/FIBER)  1,120 mL Per Tube Q24H   feeding supplement (PROSource TF)  45 mL Per Tube BID   folic acid  1 mg Oral Daily   free water  200 mL Per Tube Q4H   hydrALAZINE  100 mg Oral Q8H   insulin aspart  0-5 Units Subcutaneous QHS   insulin aspart  0-9 Units Subcutaneous TID WC   melatonin  3 mg Oral QHS   nicotine  21 mg Transdermal Daily   pantoprazole sodium  40 mg Per Tube Daily   predniSONE  30 mg Oral Q breakfast   Followed by   01/04/22 ON 01/03/2022] predniSONE  20 mg Oral Q breakfast   Followed by   01/05/2022 ON 01/06/2022] predniSONE  10 mg Oral Q breakfast   Followed by   01/08/2022 ON 01/09/2022] predniSONE  5 mg Oral Q breakfast   rosuvastatin  40 mg Oral QHS   senna-docusate  1 tablet Oral BID   thiamine  100 mg Oral Daily   tiZANidine  2 mg Oral TID   umeclidinium-vilanterol  1 puff Inhalation Daily   Vitamin D (Ergocalciferol)  50,000 Units Oral Q7 days     Assessment: 71 y.o. M known to pharmacy from rehab admission - pt with intraparenchymal hemorrhage 5/15. Pt found to have unprovoked brachial DVT. Spoke with rehab PA 6/15 Love) and she d/w Dr. Rinaldo Cloud who agrees with heparin low-dose and no bolus. Plan for head CT after heparin starts to make sure intraparenchymal hemorrhage is stable.   Heparin level: 0.51 slightly above the low conservative goal, CBC stable, no overt bleeding or infusion related issues per RN  Goal of Therapy:  Heparin level 0.3-0.5 units/ml Monitor platelets by anticoagulation protocol: Yes   Plan:  Decrease heparin to 850 units/hr Will f/u 8hr heparin level Daily heparin level and CBC  Pearlean Brownie, PharmD Clinical Pharmacist 01/02/2022 6:34 AM Please check AMION for all [facility] Pharmacy numbers

## 2022-01-02 NOTE — Progress Notes (Signed)
Physical Therapy Session Note  Patient Details  Name: Ronnie Jones MRN: [id number] Date of Birth: 1950-12-29  Today's Date: 01/02/2022 PT Individual Time: 0925-1005  and 0511-0211 PT Individual Time Calculation (min): 40 min and 30 min   Short Term Goals: Week 1:  PT Short Term Goal 1 (Week 1): Pt will tolerate sitting in WC >2 hours between therapy PT Short Term Goal 2 (Week 1): Pt will transfer to Bed with max assist of 1 consistently PT Short Term Goal 3 (Week 1): Pt will ambulate 46f with max assist + 2   Skilled Therapeutic Interventions/Progress Updates:   Session 1.   Pt received sitting in WC and agreeable to PT. PT spoke with MD. No change in therapy orders with finding of DVT in the RUE. [facility] x 2 in standing frame with mod assist and cues for posture, BUE position and assistance in BLE to achieve standing. Noted serous fluid on table for frame. Returned to sitting in WC. Noted to have small skin tear with significant drainage. Pt returned to room and performed stedy transfer to bed with mod assist. Sit>supine completed with max assist and left supine in bed with call bell in reach and all needs met. RN aware of skin tear.   Session 2.  Pt received supine in bed and agreeable to PT. Supine>sit transfer with max assist and cues for log roll technique, but put pushing from bed rail behind back.  Sit<>stand in stedy from bed and support pads x 4. With mod assist. Standing tolerance 3 x 30 sec with cues for midline. Sit>supine with max assist. Supine NMR SLR, heel slide hip abduction x 10 bil with AAROM on the LLE. Left in bed with call bell in reach and all needs met.          Therapy Documentation Precautions:  Precautions Precautions: Fall Precaution Comments: dense R hemiparesis Restrictions Weight Bearing Restrictions: No  Vital Signs: Oxygen Therapy SpO2: 100 % O2 Device: Room Air Pain:  denies   Therapy/Group:  Individual Therapy  ALorie Phenix5/27/2023, 1:43 PM

## 2022-01-02 NOTE — Progress Notes (Signed)
Occupational Therapy Session Note  Patient Details  Name: Ronnie Jones MRN: [id number] Date of Birth: 1951-01-20  Today's Date: 01/02/2022 OT Individual Time: 0702-0818 OT Individual Time Calculation (min): 76 min + 9 min + 30 min   Short Term Goals: Week 1:  OT Short Term Goal 1 (Week 1): Pt will report for 2 consecutive sessions < 3/10 pain OT Short Term Goal 2 (Week 1): Pt will sit EOB at least 3 mins during ADL task to promote OOB tolerance OT Short Term Goal 3 (Week 1): Pt will complete 1 step of dressing task at bed level with no more than min verbal cues  Skilled Therapeutic Interventions/Progress Updates:    Session 1 ([phone]): Pt received semi-reclined in bed, denies pain but endorses not feeling well/not breathing well but, agreeable to therapy. Session focus on self-care retraining, activity tolerance, transfer retraining in prep for improved ADL/IADL/func mobility performance + decreased caregiver burden. MD in/out morning rounds and later RT for breathing tx, sato2 on RA at 97-98% throughout session.   Total A to don B socks bed level. Came to sitting EOB on his R with mod A to progress hemi body and lift trunk, heavy use of bed features. CGA for static sitting balance. Total A to don pants, mod A to power up in stedy due to anterior lean to pull up. Stedy transfer > TIS. Seated at sink, pt washed face/combed hair with S. Declined further bathing. Total A to swap out hospital gown and mod A to apply deodorant under LUE, total A to incorporate RUE functionally.  Pt completed 4x10 reps on kinetron at 65 cm/sec with min A to assist RLE.  Pt left tilted back in TIS with safety belt alarm engaged, call bell in reach, and all immediate needs met.   Session 2 [phone]): Pt received returning to bed with PT and pt wife present. Pt endorses significant fatigue post session and per PT pt sustained small tear in RUE during session, causing leakage. RN to come and assess.   Did  review home set-up and anticipated DME needs upon DC. Wife plans to be home 24/7 as she is retired and her son who is in Architect will build a ramp. Pt plans to stay in living room upon DC as they have an older home with narrow doorways. Readjusted pt in bed for improved comfort and posture to self-drink with S. Pt requesting to rest at this time due to fatigue. Pt left with HOB over 30 degrees with wife present with bed alarm engaged, call bell in reach, and all immediate needs met.   Session 3 [phone]): Pt seen to make up missed time from am session. Pt received semi-reclined in bed with wife present, agreeable to therapy. Session focus on activity tolerance, BLE therex, RUE PROM in prep for improved ADL/IADL/func mobility performance + decreased caregiver burden. Pt Came to sitting EOB on his R with mod A to progress hemi body, heavy use of bed features. Close S for static sitting balance EOB. Pt declined attempts at standing due to fatigue. Pt completed 1x10 BLE marches, knee extensions/flexion with assist to complete therex on RLE. PROM to RUE at joints, cues to attend to RUE throughout to promote motor return and improved R attention. Total A to apply day time R wrist cock up splint. Returned to supine with mod A to progress BLE onto bed.  Pt left with HOB over 30 degrees with wife present with bed alarm engaged, call bell in reach, and  all immediate needs met.     Therapy Documentation Precautions:  Precautions Precautions: Fall Precaution Comments: dense R hemiparesis Restrictions Weight Bearing Restrictions: No  Pain: denies   ADL: See Care Tool for more details. Therapy/Group: Individual Therapy  Volanda Napoleon MS, OTR/L  01/02/2022, 6:54 AM

## 2022-01-02 NOTE — Progress Notes (Signed)
PROGRESS NOTE   Subjective/Complaints:  Pt reports he's "awful"- and terrible- cannot explain why- but with gestures, it sounds like he feels SOB.   O2 sats 97%- pulse 70-80 on pulse ox.   Sob resolved after Albuterol treatment.   Is on heparin gtt for DVT RUE.    ROS: Limited by cognition/aphasia.   Objective:   DG Chest 2 View  Result Date: 12/31/2021 CLINICAL DATA:  Short of breath EXAM: CHEST - 2 VIEW COMPARISON:  12/26/2021 FINDINGS: Heart size upper normal. Vascularity normal. Lungs clear without infiltrate or effusion. Feeding tube passes into the stomach with the tip not visualized. Chronic left rib fracture IMPRESSION: No active cardiopulmonary disease. Electronically Signed   By: Franchot Gallo M.D.   On: 12/31/2021 19:15   CT HEAD WO CONTRAST (5MM)  Result Date: 01/01/2022 CLINICAL DATA:  Follow-up hemorrhage EXAM: CT HEAD WITHOUT CONTRAST TECHNIQUE: Contiguous axial images were obtained from the base of the skull through the vertex without intravenous contrast. RADIATION DOSE REDUCTION: This exam was performed according to the departmental dose-optimization program which includes automated exposure control, adjustment of the mA and/or kV according to patient size and/or use of iterative reconstruction technique. COMPARISON:  CT 12/23/2021, 12/21/2021, MRI 12/27/2021 FINDINGS: Brain: Redemonstrated intraparenchymal hemorrhage centered within left thalamus, this has slightly decreased in size compared with the previous CTs. There is interval increase in surrounding hypodense edema. There is decreased bilateral intraventricular hemorrhage compared to the previous CT examinations. Local mass effect with septum pellucidum shifted 3 mm to the right as before. There is slight interval increase in lateral ventricular dilatation. Third and fourth ventricles are stable in size. Atrophy and chronic small vessel ischemic changes of the  white matter. No new hemorrhagic focus. Vascular: No hyperdense vessels. Carotid vascular and vertebral calcification Skull: Normal. Negative for fracture or focal lesion. Sinuses/Orbits: No acute finding. Other: None IMPRESSION: 1. Slight interval decrease in size of the intraparenchymal hemorrhage centered within the left thalamus, hemorrhage is becoming less dense compared to the prior CTs. There is also slight interval decrease in bilateral intraventricular hemorrhage. Increased edema surrounding the hemorrhage since initial CT from May 15, probably no great change compared with the interval MRI on the twenty-first. Local mass effect on the left lateral ventricle with about 3 mm shift of the septum to the right as before. The lateral ventricles are slightly enlarged compared to prior but the third and fourth ventricles are stable in size. 2. Atrophy and chronic small vessel ischemic changes of the white matter. Electronically Signed   By: Donavan Foil M.D.   On: 01/01/2022 21:09   DG Abd Portable 1V  Result Date: 01/01/2022 CLINICAL DATA:  Feeding tube replacement EXAM: PORTABLE ABDOMEN - 1 VIEW COMPARISON:  01/01/2022 12:30 a.m. FINDINGS: Feeding tube tip projects over the descending duodenum. Atherosclerotic calcification of the aortic arch. IMPRESSION: 1. The feeding tube tip currently projects over the descending duodenum. 2.  Aortic Atherosclerosis (ICD10-I70.0). Electronically Signed   By: Van Clines M.D.   On: 01/01/2022 10:01   DG Abd Portable 1V  Result Date: 01/01/2022 CLINICAL DATA:  NG tube placement EXAM: PORTABLE ABDOMEN - 1 VIEW COMPARISON:  None Available.  FINDINGS: Weighted feeding tube terminates at the GE junction. Advancement at least 5 cm stomach is suggested. IMPRESSION: Weighted feeding tube terminates at the GE junction. Advancement at least 5 cm stomach is suggested. Electronically Signed   By: Julian Hy M.D.   On: 01/01/2022 00:46   VAS [place] LOWER EXTREMITY  VENOUS (DVT)  Result Date: 01/01/2022  Lower Venous DVT Study Patient Name:  [facility]  Date of Exam:   01/01/2022 Medical Rec #: [id number]       Accession #:    [id number] Date of Birth: 1950/08/18      Patient Gender: M Patient Age:   71 years Exam Location:  [facility] Procedure:      VAS [place] LOWER EXTREMITY VENOUS (DVT) Referring Phys: PAMELA LOVE --------------------------------------------------------------------------------  Indications: Edema, and immobility.  Comparison Study: No prior study Performing Technologist: Maudry Mayhew MHA, RDMS, RVT, RDCS  Examination Guidelines: A complete evaluation includes B-mode imaging, spectral Doppler, color Doppler, and power Doppler as needed of all accessible portions of each vessel. Bilateral testing is considered an integral part of a complete examination. Limited examinations for reoccurring indications may be performed as noted. The reflux portion of the exam is performed with the patient in reverse Trendelenburg.  +---------+---------------+---------+-----------+----------+--------------+ RIGHT    CompressibilityPhasicitySpontaneityPropertiesThrombus Aging +---------+---------------+---------+-----------+----------+--------------+ CFV      Full           Yes      Yes                                 +---------+---------------+---------+-----------+----------+--------------+ SFJ      Full                                                        +---------+---------------+---------+-----------+----------+--------------+ FV Prox  Full                                                        +---------+---------------+---------+-----------+----------+--------------+ FV Mid   Full                                                        +---------+---------------+---------+-----------+----------+--------------+ FV DistalFull                                                         +---------+---------------+---------+-----------+----------+--------------+ PFV      Full                                                        +---------+---------------+---------+-----------+----------+--------------+ POP      Full  Yes      Yes                                 +---------+---------------+---------+-----------+----------+--------------+ PTV      Full                                                        +---------+---------------+---------+-----------+----------+--------------+ PERO     Full                                                        +---------+---------------+---------+-----------+----------+--------------+   +---------+---------------+---------+-----------+----------+--------------+ LEFT     CompressibilityPhasicitySpontaneityPropertiesThrombus Aging +---------+---------------+---------+-----------+----------+--------------+ CFV      Full           Yes      Yes                                 +---------+---------------+---------+-----------+----------+--------------+ SFJ      Full                                                        +---------+---------------+---------+-----------+----------+--------------+ FV Prox  Full                                                        +---------+---------------+---------+-----------+----------+--------------+ FV Mid   Full                                                        +---------+---------------+---------+-----------+----------+--------------+ FV DistalFull                                                        +---------+---------------+---------+-----------+----------+--------------+ PFV      Full                                                        +---------+---------------+---------+-----------+----------+--------------+ POP      Full           Yes      Yes                                  +---------+---------------+---------+-----------+----------+--------------+ PTV  Full                                                        +---------+---------------+---------+-----------+----------+--------------+ PERO     Full                                                        +---------+---------------+---------+-----------+----------+--------------+     Summary: RIGHT: - There is no evidence of deep vein thrombosis in the lower extremity.  - No cystic structure found in the popliteal fossa.  LEFT: - There is no evidence of deep vein thrombosis in the lower extremity.  - No cystic structure found in the popliteal fossa.  *See table(s) above for measurements and observations. Electronically signed by Servando Snare MD on 01/01/2022 at 7:02:06 PM.    Final    VAS [place] UPPER EXTREMITY VENOUS DUPLEX  Result Date: 01/01/2022 UPPER VENOUS STUDY  Patient Name:  [facility]  Date of Exam:   01/01/2022 Medical Rec #: [id number]       Accession #:    [id number] Date of Birth: 26-Dec-1950      Patient Gender: M Patient Age:   79 years Exam Location:  [facility] Procedure:      VAS [place] UPPER EXTREMITY VENOUS DUPLEX Referring Phys: PAMELA LOVE --------------------------------------------------------------------------------  Indications: Edema Limitations: Poor ultrasound/tissue interface and position, contracture. Comparison Study: No prior study Performing Technologist: Maudry Mayhew MHA, RDMS, RVT, RDCS  Examination Guidelines: A complete evaluation includes B-mode imaging, spectral Doppler, color Doppler, and power Doppler as needed of all accessible portions of each vessel. Bilateral testing is considered an integral part of a complete examination. Limited examinations for reoccurring indications may be performed as noted.  Right Findings: +----------+------------+---------+-----------+----------+-------+ RIGHT     CompressiblePhasicitySpontaneousPropertiesSummary  +----------+------------+---------+-----------+----------+-------+ IJV           Full       Yes       Yes                      +----------+------------+---------+-----------+----------+-------+ Subclavian    Full       Yes       Yes                      +----------+------------+---------+-----------+----------+-------+ Axillary      Full       Yes       Yes                      +----------+------------+---------+-----------+----------+-------+ Brachial      None                 No                Acute  +----------+------------+---------+-----------+----------+-------+ Radial        Full                                          +----------+------------+---------+-----------+----------+-------+ Ulnar         Full                                          +----------+------------+---------+-----------+----------+-------+  Cephalic      Full                                          +----------+------------+---------+-----------+----------+-------+ Basilic       None                                   Acute  +----------+------------+---------+-----------+----------+-------+  Left Findings: +----------+------------+---------+-----------+----------+--------------+ LEFT      CompressiblePhasicitySpontaneousProperties   Summary     +----------+------------+---------+-----------+----------+--------------+ Subclavian                                          Not visualized +----------+------------+---------+-----------+----------+--------------+  Summary:  Right: Findings consistent with acute deep vein thrombosis involving the right brachial veins. Findings consistent with acute superficial vein thrombosis involving the right basilic vein.  *See table(s) above for measurements and observations.  Diagnosing physician: Servando Snare MD Electronically signed by Servando Snare MD on 01/01/2022 at 7:02:12 PM.    Final     Recent Labs    01/01/22 0523 01/02/22 0510   WBC 9.8 10.3  HGB 13.9 13.5  HCT 41.2 41.4  PLT 188 182   Recent Labs    01/01/22 0523  NA 137  K 4.0  CL 108  CO2 21*  GLUCOSE 115*  BUN 39*  CREATININE 1.36*  CALCIUM 8.5*    Intake/Output Summary (Last 24 hours) at 01/02/2022 1345 Last data filed at 01/02/2022 0503 Gross per 24 hour  Intake 1052.76 ml  Output 150 ml  Net 902.76 ml        Physical Exam: Vital Signs Blood pressure (!) 167/84, pulse 80, temperature 98.4 F (36.9 C), resp. rate 16, height 5\' 7"  (1.702 m), weight 77.2 kg, SpO2 97 %.    General: awake, alert, appropriate, OT at bedside; NAD HENT: conjugate gaze; oropharynx dry- has cortrak in place CV: regular rate; no JVD Pulmonary: CTA B/L;- audible wheezing- increased rate of breathing until after Albuterol tx GI: firm, NT, ND, (+)BS- LAST BM after seen me Psychiatric: appropriate- appears frustrated Neurological: aphasic- but answers with adjectives pretty well  Assessment/Plan: 1. Functional deficits which require 3+ hours per day of interdisciplinary therapy in a comprehensive inpatient rehab setting. Physiatrist is providing close team supervision and 24 hour management of active medical problems listed below. Physiatrist and rehab team continue to assess barriers to discharge/monitor patient progress toward functional and medical goals  Care Tool:  Bathing  Bathing activity did not occur: Refused Body parts bathed by patient: Face         Bathing assist Assist Level: Supervision/Verbal cueing     Upper Body Dressing/Undressing Upper body dressing   What is the patient wearing?: Hospital gown only    Upper body assist Assist Level: Total Assistance - Patient < 25%    Lower Body Dressing/Undressing Lower body dressing      What is the patient wearing?: Pants     Lower body assist Assist for lower body dressing: Total Assistance - Patient < 25%     Toileting Toileting    Toileting assist Assist for toileting: Total  Assistance - Patient < 25%     Transfers Chair/bed transfer  Transfers assist     Chair/bed  transfer assist level: Dependent - mechanical lift     Locomotion Ambulation   Ambulation assist   Ambulation activity did not occur: Safety/medical concerns          Walk 10 feet activity   Assist  Walk 10 feet activity did not occur: Safety/medical concerns        Walk 50 feet activity   Assist Walk 50 feet with 2 turns activity did not occur: Safety/medical concerns         Walk 150 feet activity   Assist Walk 150 feet activity did not occur: Safety/medical concerns         Walk 10 feet on uneven surface  activity   Assist Walk 10 feet on uneven surfaces activity did not occur: Safety/medical concerns         Wheelchair     Assist Is the patient using a wheelchair?: Yes Type of Wheelchair: Manual    Wheelchair assist level: Dependent - Patient 0% Max wheelchair distance: 150    Wheelchair 50 feet with 2 turns activity    Assist        Assist Level: Dependent - Patient 0%   Wheelchair 150 feet activity     Assist      Assist Level: Dependent - Patient 0%   Blood pressure (!) 167/84, pulse 80, temperature 98.4 F (36.9 C), resp. rate 16, height 5\' 7"  (1.702 m), weight 77.2 kg, SpO2 97 %.  Medical Problem List and Plan: 1. 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  Antithrombotics: -DVT/anticoagulation:  Pharmaceutical: Heparin  5/27- R brachial DVT- placed on Heparin gtt with no bolus per Dr Pearlean Brownie- pharmacy managing.              -antiplatelet therapy: none 3. Pain Management: Tylenol as needed 4. Mood: LCSW to evaluate and provide emotional support             -antipsychotic agents: n/a 5. Neuropsych: This patient is capable of  making decisions on his own behalf. 6. Skin/Wound Care: Routine skin care checks 7. Fluids/Electrolytes/Nutrition: Routine Is and Os and follow-up chemistries             --dysphagia 1; honey thick- Has Cortrak in place and receiving TF's.  8: T2DM: Hgb A1c- metformin on hold due to AKI/variable intake (puree diet). --Monitor BS ac/hs and use SSI for elevated BS. --Change TF to 6 pm-6 am CBG (last 3)  Recent Labs    01/01/22 2124 01/02/22 0539 01/02/22 1143  GLUCAP 129* 216* 152*   5/27- BG variable, but better- con't regimen 9: Hypertension: Off Lisinopril due to AKI/hyperkalemia  --continue amlodipine, atenolol, hydralazine Vitals:   01/02/22 1219 01/02/22 1343  BP:  (!) 167/84  Pulse:  80  Resp:  16  Temp:  98.4 F (36.9 C)  SpO2: 100% 97%  Elevated systolic , starting tizanidine for spasticity which may lower BP will monitor  5/27- BP 160s systolic- might add meds tomorrow? 10:  COPD: Continue Anoro Ellipta daily.  Flutter valve qid.  --Will check CXR due to decreased BS/SOB and ice chips.   --Continue to taper prednisone. --Duoneb prn 5/27- SOB this AM- responded well to albuterol treatment given 11: Alcohol abuse: cessation counseling. Off  phenobarb. Check Thiamine level.  --continue B12, thiamine and folic acid 12: Tobacco abuse: cessation counseling --continue nicotine patch 13: Hyperlipidemia: LDL- 192 and Trig- 499-->continue Crestor 40 mg 14: AKI: Baseline SCr- 1.36-->2.39-->1.57  --Encourage --drinking honey liquids but not touching puree's.              --continue water flushes qid via cortak --Recheck renal status in am.  5/27- Cr down to 1.26 and BUN 39- better- con't to monitor 15. Vitamin C deficiency: On daily supplement 16. Vitamin D deficiency: Continue weekly supplement.   17. 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     LOS: 2 days A FACE TO FACE EVALUATION WAS PERFORMED  Donis Pinder 01/02/2022, 1:45 PM

## 2022-01-03 DIAGNOSIS — I619 Nontraumatic intracerebral hemorrhage, unspecified: Secondary | ICD-10-CM | POA: Diagnosis not present

## 2022-01-03 LAB — CBC
HCT: 42.8 % (ref 39.0–52.0)
Hemoglobin: 14 g/dL (ref 13.0–17.0)
MCH: 29.7 pg (ref 26.0–34.0)
MCHC: 32.7 g/dL (ref 30.0–36.0)
MCV: 90.7 fL (ref 80.0–100.0)
Platelets: 174 10*3/uL (ref 150–400)
RBC: 4.72 MIL/uL (ref 4.22–5.81)
RDW: 13.1 % (ref 11.5–15.5)
WBC: 11 10*3/uL — ABNORMAL HIGH (ref 4.0–10.5)
nRBC: 0 % (ref 0.0–0.2)

## 2022-01-03 LAB — GLUCOSE, CAPILLARY
Glucose-Capillary: 155 mg/dL — ABNORMAL HIGH (ref 70–99)
Glucose-Capillary: 143 mg/dL — ABNORMAL HIGH (ref 70–99)
Glucose-Capillary: 148 mg/dL — ABNORMAL HIGH (ref 70–99)
Glucose-Capillary: 149 mg/dL — ABNORMAL HIGH (ref 70–99)

## 2022-01-03 LAB — HEPARIN LEVEL (UNFRACTIONATED)
Heparin Unfractionated: 0.2 IU/mL — ABNORMAL LOW (ref 0.30–0.70)
Heparin Unfractionated: 0.26 IU/mL — ABNORMAL LOW (ref 0.30–0.70)
Heparin Unfractionated: 0.34 IU/mL (ref 0.30–0.70)

## 2022-01-03 MED ORDER — BUDESONIDE 0.5 MG/2ML IN SUSP
0.5000 mg | Freq: Two times a day (BID) | RESPIRATORY_TRACT | Status: DC
  Administered 2022-01-03 – 2022-01-20 (×29): 0.5 mg via RESPIRATORY_TRACT
  Filled 2022-01-03 (×39): qty 2

## 2022-01-03 NOTE — Progress Notes (Signed)
ANTICOAGULATION CONSULT NOTE - Follow Up Consult  Pharmacy Consult for Heparin Indication: DVT  Allergies  Allergen Reactions   Codeine Nausea Only    Patient Measurements: Height: 5\' 7"  (170.2 cm) Weight: 77 kg (169 lb 12.1 oz) IBW/kg (Calculated) : 66.1 kg Heparin Dosing Weight: 77 kg  Vital Signs: Temp: 98.2 F (36.8 C) (05/28 2017) Temp Source: Oral (05/28 2017) BP: 152/91 (05/28 2017) Pulse Rate: 88 (05/28 2017)  Labs: Recent Labs    01/01/22 0523 01/02/22 0510 01/02/22 1556 01/03/22 0535 01/03/22 1326 01/03/22 2132  HGB 13.9 13.5  --  14.0  --   --   HCT 41.2 41.4  --  42.8  --   --   PLT 188 182  --  174  --   --   HEPARINUNFRC  --  0.51   < > 0.20* 0.26* 0.34  CREATININE 1.36*  --   --   --   --   --    < > = values in this interval not displayed.     Estimated Creatinine Clearance: 48.6 mL/min (A) (by C-G formula based on SCr of 1.36 mg/dL (H)).   Medications:  Scheduled:   amLODipine  10 mg Oral Daily   vitamin C  500 mg Oral BID   atenolol  50 mg Oral Daily   budesonide (PULMICORT) nebulizer solution  0.5 mg Nebulization BID   chlorhexidine  15 mL Mouth Rinse BID   feeding supplement (JEVITY 1.5 CAL/FIBER)  1,120 mL Per Tube Q24H   feeding supplement (PROSource TF)  45 mL Per Tube BID   folic acid  1 mg Oral Daily   free water  200 mL Per Tube Q4H   hydrALAZINE  100 mg Oral Q8H   insulin aspart  0-5 Units Subcutaneous QHS   insulin aspart  0-9 Units Subcutaneous TID WC   ipratropium-albuterol  3 mL Nebulization Q6H   melatonin  3 mg Oral QHS   nicotine  21 mg Transdermal Daily   pantoprazole sodium  40 mg Oral Daily   predniSONE  20 mg Oral Q breakfast   Followed by   Derrill Memo ON 01/06/2022] predniSONE  10 mg Oral Q breakfast   Followed by   Derrill Memo ON 01/09/2022] predniSONE  5 mg Oral Q breakfast   rosuvastatin  40 mg Oral QHS   senna-docusate  1 tablet Oral BID   thiamine  100 mg Oral Daily   tiZANidine  2 mg Oral TID    umeclidinium-vilanterol  1 puff Inhalation Daily   Vitamin D (Ergocalciferol)  50,000 Units Oral Q7 days   Infusions:   heparin 1,050 Units/hr (01/03/22 1501)   PRN: acetaminophen, albuterol, alum & mag hydroxide-simeth, bisacodyl, diphenhydrAMINE, guaiFENesin-dextromethorphan, magnesium hydroxide, methocarbamol, prochlorperazine **OR** prochlorperazine **OR** prochlorperazine, sodium phosphate, traZODone  Assessment: 71 yo male presents with left subcortical IPH and is now found to have an 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 PTA the patient is not on anticoagulation. Pharmacy is consulted to dose heparin.   Heparin level is therapeutic at 0.34 while running at 1050 units/hr. No issues with the infusion or bleeding noted.  Goal of Therapy:  Heparin level 0.3-0.5 units/ml - no bolus Monitor platelets by anticoagulation protocol: Yes   Plan:  Continue heparin IV at 1050 units/hr Monitor for signs and symptoms of bleeding Obtain a daily heparin level and CBC   Erin Hearing PharmD., BCPS Clinical Pharmacist 01/03/2022 10:12 PM

## 2022-01-03 NOTE — Progress Notes (Signed)
ANTICOAGULATION CONSULT NOTE - Follow Up Consult  Pharmacy Consult for heparin Indication: DVT in setting of IPH  Labs: Recent Labs    01/01/22 0523 01/02/22 0510 01/02/22 1556 01/03/22 0535  HGB 13.9 13.5  --  14.0  HCT 41.2 41.4  --  42.8  PLT 188 182  --  174  HEPARINUNFRC  --  0.51 0.37 0.20*  CREATININE 1.36*  --   --   --     Assessment: 71yo male subtherapeutic on heparin after one level at goal; no infusion issues or signs of bleeding per RN.  Goal of Therapy:  Heparin level 0.3-0.5 units/ml   Plan:  Will increase heparin infusion by 1-2 units/kg/hr to 950 units/hr and check level in 8 hours.    Vernard Gambles, PharmD, BCPS  01/03/2022,6:36 AM

## 2022-01-03 NOTE — Progress Notes (Signed)
ANTICOAGULATION CONSULT NOTE - Follow Up Consult  Pharmacy Consult for Heparin Indication: DVT  Allergies  Allergen Reactions   Codeine Nausea Only    Patient Measurements: Height: 5\' 7"  (170.2 cm) Weight: 77 kg (169 lb 12.1 oz) IBW/kg (Calculated) : 66.1 kg Heparin Dosing Weight: 77 kg  Vital Signs: Temp: 97.8 F (36.6 C) (05/28 0459) Temp Source: Oral (05/28 0459) BP: 125/66 (05/28 0600) Pulse Rate: 78 (05/28 0600)  Labs: Recent Labs    01/01/22 0523 01/02/22 0510 01/02/22 1556 01/03/22 0535  HGB 13.9 13.5  --  14.0  HCT 41.2 41.4  --  42.8  PLT 188 182  --  174  HEPARINUNFRC  --  0.51 0.37 0.20*  CREATININE 1.36*  --   --   --     Estimated Creatinine Clearance: 48.6 mL/min (A) (by C-G formula based on SCr of 1.36 mg/dL (H)).   Medications:  Scheduled:   albuterol       amLODipine  10 mg Oral Daily   vitamin C  500 mg Oral BID   atenolol  50 mg Oral Daily   chlorhexidine  15 mL Mouth Rinse BID   feeding supplement (JEVITY 1.5 CAL/FIBER)  1,120 mL Per Tube Q24H   feeding supplement (PROSource TF)  45 mL Per Tube BID   folic acid  1 mg Oral Daily   free water  200 mL Per Tube Q4H   hydrALAZINE  100 mg Oral Q8H   insulin aspart  0-5 Units Subcutaneous QHS   insulin aspart  0-9 Units Subcutaneous TID WC   ipratropium-albuterol  3 mL Nebulization Q6H   melatonin  3 mg Oral QHS   nicotine  21 mg Transdermal Daily   pantoprazole sodium  40 mg Oral Daily   predniSONE  20 mg Oral Q breakfast   Followed by   01/05/22 ON 01/06/2022] predniSONE  10 mg Oral Q breakfast   Followed by   01/08/2022 ON 01/09/2022] predniSONE  5 mg Oral Q breakfast   rosuvastatin  40 mg Oral QHS   senna-docusate  1 tablet Oral BID   thiamine  100 mg Oral Daily   tiZANidine  2 mg Oral TID   umeclidinium-vilanterol  1 puff Inhalation Daily   Vitamin D (Ergocalciferol)  50,000 Units Oral Q7 days   Infusions:   heparin 950 Units/hr (01/03/22 0658)   PRN: acetaminophen, albuterol, alum &  mag hydroxide-simeth, bisacodyl, diphenhydrAMINE, guaiFENesin-dextromethorphan, magnesium hydroxide, methocarbamol, prochlorperazine **OR** prochlorperazine **OR** prochlorperazine, sodium phosphate, traZODone  Assessment: 71 yo male presents with left subcortical IPH and is now found to have an 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 PTA the patient is not on anticoagulation. Pharmacy is consulted to dose heparin.   Heparin level is slightly subtherapeutic at 0.26 while running at 950 units/hr. Per the RN, there have been no issues with the infusion and the patient is without signs or symptoms of bleeding. CBC is WNL and stable.   Goal of Therapy:  Heparin level 0.3-0.5 units/ml - no bolus Monitor platelets by anticoagulation protocol: Yes   Plan:  Increase heparin IV to 1050 units/hr Check a 6-hour heparin level Monitor for signs and symptoms of bleeding Obtain a daily heparin level and CBC    73, PharmD, RPh  PGY-2 Pharmacy Resident 01/03/2022 7:44 AM  Please check [email or web address] for unit-specific pharmacy phone numbers.

## 2022-01-03 NOTE — Progress Notes (Signed)
PROGRESS NOTE   Subjective/Complaints:  Is on Heparin gtt-  Sleepy per pt- breathing better, but speaking with Resp therapy, they want to add Pulmicort Nebs to help with congestion-   Denies any issues.  Nurse reports a lot of weeping from RUE- wrapped with kerlex- elbow swollen especially.    ROS: Limited by sedation/aphasia this AM  Objective:   CT HEAD WO CONTRAST (5MM)  Result Date: 01/01/2022 CLINICAL DATA:  Follow-up hemorrhage EXAM: CT HEAD WITHOUT CONTRAST TECHNIQUE: Contiguous axial images were obtained from the base of the skull through the vertex without intravenous contrast. RADIATION DOSE REDUCTION: This exam was performed according to the departmental dose-optimization program which includes automated exposure control, adjustment of the mA and/or kV according to patient size and/or use of iterative reconstruction technique. COMPARISON:  CT 12/23/2021, 12/21/2021, MRI 12/27/2021 FINDINGS: Brain: Redemonstrated intraparenchymal hemorrhage centered within left thalamus, this has slightly decreased in size compared with the previous CTs. There is interval increase in surrounding hypodense edema. There is decreased bilateral intraventricular hemorrhage compared to the previous CT examinations. Local mass effect with septum pellucidum shifted 3 mm to the right as before. There is slight interval increase in lateral ventricular dilatation. Third and fourth ventricles are stable in size. Atrophy and chronic small vessel ischemic changes of the white matter. No new hemorrhagic focus. Vascular: No hyperdense vessels. Carotid vascular and vertebral calcification Skull: Normal. Negative for fracture or focal lesion. Sinuses/Orbits: No acute finding. Other: None IMPRESSION: 1. Slight interval decrease in size of the intraparenchymal hemorrhage centered within the left thalamus, hemorrhage is becoming less dense compared to the prior CTs.  There is also slight interval decrease in bilateral intraventricular hemorrhage. Increased edema surrounding the hemorrhage since initial CT from May 15, probably no great change compared with the interval MRI on the twenty-first. Local mass effect on the left lateral ventricle with about 3 mm shift of the septum to the right as before. The lateral ventricles are slightly enlarged compared to prior but the third and fourth ventricles are stable in size. 2. Atrophy and chronic small vessel ischemic changes of the white matter. Electronically Signed   By: Donavan Foil M.D.   On: 01/01/2022 21:09   VAS [place] LOWER EXTREMITY VENOUS (DVT)  Result Date: 01/01/2022  Lower Venous DVT Study Patient Name:  Ronnie Jones  Date of Exam:   01/01/2022 Medical Rec #: [id number]       Accession #:    [id number] Date of Birth: 02-Dec-1950      Patient Gender: M Patient Age:   70 years Exam Location:  [facility] Procedure:      VAS [place] LOWER EXTREMITY VENOUS (DVT) Referring Phys: PAMELA LOVE --------------------------------------------------------------------------------  Indications: Edema, and immobility.  Comparison Study: No prior study Performing Technologist: Maudry Mayhew MHA, RDMS, RVT, RDCS  Examination Guidelines: A complete evaluation includes B-mode imaging, spectral Doppler, color Doppler, and power Doppler as needed of all accessible portions of each vessel. Bilateral testing is considered an integral part of a complete examination. Limited examinations for reoccurring indications may be performed as noted. The reflux portion of the exam is performed with the patient in reverse Trendelenburg.  +---------+---------------+---------+-----------+----------+--------------+ RIGHT  CompressibilityPhasicitySpontaneityPropertiesThrombus Aging +---------+---------------+---------+-----------+----------+--------------+ CFV      Full           Yes      Yes                                  +---------+---------------+---------+-----------+----------+--------------+ SFJ      Full                                                        +---------+---------------+---------+-----------+----------+--------------+ FV Prox  Full                                                        +---------+---------------+---------+-----------+----------+--------------+ FV Mid   Full                                                        +---------+---------------+---------+-----------+----------+--------------+ FV DistalFull                                                        +---------+---------------+---------+-----------+----------+--------------+ PFV      Full                                                        +---------+---------------+---------+-----------+----------+--------------+ POP      Full           Yes      Yes                                 +---------+---------------+---------+-----------+----------+--------------+ PTV      Full                                                        +---------+---------------+---------+-----------+----------+--------------+ PERO     Full                                                        +---------+---------------+---------+-----------+----------+--------------+   +---------+---------------+---------+-----------+----------+--------------+ LEFT     CompressibilityPhasicitySpontaneityPropertiesThrombus Aging +---------+---------------+---------+-----------+----------+--------------+ CFV      Full           Yes      Yes                                 +---------+---------------+---------+-----------+----------+--------------+  SFJ      Full                                                        +---------+---------------+---------+-----------+----------+--------------+ FV Prox  Full                                                         +---------+---------------+---------+-----------+----------+--------------+ FV Mid   Full                                                        +---------+---------------+---------+-----------+----------+--------------+ FV DistalFull                                                        +---------+---------------+---------+-----------+----------+--------------+ PFV      Full                                                        +---------+---------------+---------+-----------+----------+--------------+ POP      Full           Yes      Yes                                 +---------+---------------+---------+-----------+----------+--------------+ PTV      Full                                                        +---------+---------------+---------+-----------+----------+--------------+ PERO     Full                                                        +---------+---------------+---------+-----------+----------+--------------+     Summary: RIGHT: - There is no evidence of deep vein thrombosis in the lower extremity.  - No cystic structure found in the popliteal fossa.  LEFT: - There is no evidence of deep vein thrombosis in the lower extremity.  - No cystic structure found in the popliteal fossa.  *See table(s) above for measurements and observations. Electronically signed by Lemar LivingsBrandon Cain MD on 01/01/2022 at 7:02:06 PM.    Final    VAS US UPPER EXTREMITY VENOUS DUPLEX  Result Date: 01/01/2022 UPPER VENOUS STUDY  [facility]  Date of Exam:   01/01/2022 Medical Rec #: [id number]  Accession #:    [id number] Date of Birth: 11/17/1950      Patient Gender: M Patient Age:   42 years Exam Location:  [facility] Procedure:      VAS [place] UPPER EXTREMITY VENOUS DUPLEX Referring Phys: PAMELA LOVE --------------------------------------------------------------------------------  Indications: Edema Limitations: Poor ultrasound/tissue interface and  position, contracture. Comparison Study: No prior study Performing Technologist: Maudry Mayhew MHA, RDMS, RVT, RDCS  Examination Guidelines: A complete evaluation includes B-mode imaging, spectral Doppler, color Doppler, and power Doppler as needed of all accessible portions of each vessel. Bilateral testing is considered an integral part of a complete examination. Limited examinations for reoccurring indications may be performed as noted.  Right Findings: +----------+------------+---------+-----------+----------+-------+ RIGHT     CompressiblePhasicitySpontaneousPropertiesSummary +----------+------------+---------+-----------+----------+-------+ IJV           Full       Yes       Yes                      +----------+------------+---------+-----------+----------+-------+ Subclavian    Full       Yes       Yes                      +----------+------------+---------+-----------+----------+-------+ Axillary      Full       Yes       Yes                      +----------+------------+---------+-----------+----------+-------+ Brachial      None                 No                Acute  +----------+------------+---------+-----------+----------+-------+ Radial        Full                                          +----------+------------+---------+-----------+----------+-------+ Ulnar         Full                                          +----------+------------+---------+-----------+----------+-------+ Cephalic      Full                                          +----------+------------+---------+-----------+----------+-------+ Basilic       None                                   Acute  +----------+------------+---------+-----------+----------+-------+  Left Findings: +----------+------------+---------+-----------+----------+--------------+ LEFT      CompressiblePhasicitySpontaneousProperties   Summary      +----------+------------+---------+-----------+----------+--------------+ Subclavian                                          Not visualized +----------+------------+---------+-----------+----------+--------------+  Summary:  Right: Findings consistent with acute deep vein thrombosis involving the right brachial veins. Findings consistent with acute superficial vein thrombosis involving the right basilic vein.  *See table(s) above for measurements and observations.  Diagnosing physician: Lemar Livings MD Electronically signed by Lemar Livings MD on 01/01/2022 at 7:02:12 PM.    Final     Recent Labs    01/02/22 0510 01/03/22 0535  WBC 10.3 11.0*  HGB 13.5 14.0  HCT 41.4 42.8  PLT 182 174   Recent Labs    01/01/22 0523  NA 137  K 4.0  CL 108  CO2 21*  GLUCOSE 115*  BUN 39*  CREATININE 1.36*  CALCIUM 8.5*    Intake/Output Summary (Last 24 hours) at 01/03/2022 0946 Last data filed at 01/03/2022 0600 Gross per 24 hour  Intake 1411.37 ml  Output --  Net 1411.37 ml        Physical Exam: Vital Signs Blood pressure 125/66, pulse 78, temperature 97.8 F (36.6 C), temperature source Oral, resp. rate 18, height  (1.702 m), weight 77 kg, SpO2 100 %.     General: sleepy- woke briefly; supine in bed; on heparin gtt;  NAD HENT: oropharynx dry; cortrak in place CV: regular rate; no JVD Pulmonary: adequate air movement- a few rhonchi, but much better GI: soft, NT, ND, (+)BS Psychiatric: appropriate Neurological: sleepy- woke briefly to say OK- but went back to sleep Skin: weeping from skin RUE- wrapped in kerlex Extremities: 1+ RLE edema; 3+ pitting edema RUE Assessment/Plan: 1. Functional deficits which require 3+ hours per day of interdisciplinary therapy in a comprehensive inpatient rehab setting. Physiatrist is providing close team supervision and 24 hour management of active medical problems listed below. Physiatrist and rehab team continue to assess barriers to  discharge/monitor patient progress toward functional and medical goals  Care Tool:  Bathing  Bathing activity did not occur: Refused Body parts bathed by patient: Face         Bathing assist Assist Level: Supervision/Verbal cueing     Upper Body Dressing/Undressing Upper body dressing   What is the patient wearing?: Hospital gown only    Upper body assist Assist Level: Total Assistance - Patient < 25%    Lower Body Dressing/Undressing Lower body dressing      What is the patient wearing?: Pants     Lower body assist Assist for lower body dressing: Total Assistance - Patient < 25%     Toileting Toileting    Toileting assist Assist for toileting: Total Assistance - Patient < 25%     Transfers Chair/bed transfer  Transfers assist     Chair/bed transfer assist level: Dependent - mechanical lift     Locomotion Ambulation   Ambulation assist   Ambulation activity did not occur: Safety/medical concerns          Walk 10 feet activity   Assist  Walk 10 feet activity did not occur: Safety/medical concerns        Walk 50 feet activity   Assist Walk 50 feet with 2 turns activity did not occur: Safety/medical concerns         Walk 150 feet activity   Assist Walk 150 feet activity did not occur: Safety/medical concerns         Walk 10 feet on uneven surface  activity   Assist Walk 10 feet on uneven surfaces activity did not occur: Safety/medical concerns         Wheelchair     Assist Is the patient using a wheelchair?: Yes Type of Wheelchair: Manual    Wheelchair assist level: Dependent - Patient 0% Max wheelchair distance: 150    Wheelchair 50 feet with 2 turns activity  Assist        Assist Level: Dependent - Patient 0%   Wheelchair 150 feet activity     Assist      Assist Level: Dependent - Patient 0%   Blood pressure 125/66, pulse 78, temperature 97.8 F (36.6 C), temperature source Oral, resp.  rate 18, height 5\' 7"  (1.702 m), weight 77 kg, SpO2 100 %.  Medical Problem List and Plan: 1. 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  Antithrombotics: -DVT/anticoagulation:  Pharmaceutical: Heparin  5/27- R brachial DVT- placed on Heparin gtt with no bolus per Dr Leonie Man- pharmacy managing.   5/28- Hb stable-no signs of additonal stroke/bleeding so far.              -antiplatelet therapy: none 3. Pain Management: Tylenol as needed 4. Mood: LCSW to evaluate and provide emotional support             -antipsychotic agents: n/a 5. Neuropsych: This patient is capable of making decisions on his own behalf. 6. Skin/Wound Care: Routine skin care checks 7. Fluids/Electrolytes/Nutrition: Routine Is and Os and follow-up chemistries             --dysphagia 1; honey thick- Has Cortrak in place and receiving TF's.  8: T2DM: Hgb A1c- metformin on hold due to AKI/variable intake (puree diet). --Monitor BS ac/hs and use SSI for elevated BS. --Change TF to 6 pm-6 am CBG (last 3)  Recent Labs    01/02/22 1708 01/02/22 2054 01/03/22 0616  GLUCAP 131* 199* 155*  5/28- CBGs look better- con't regimen 9: Hypertension: Off Lisinopril due to AKI/hyperkalemia  --continue amlodipine, atenolol, hydralazine Vitals:   01/03/22 0600 01/03/22 0912  BP: 125/66   Pulse: 78   Resp:    Temp:    SpO2:  [id number]  Elevated systolic , starting tizanidine for spasticity which may lower BP will monitor  [id number]- BP [id number] systolic- might add meds tomorrow? 5/28- BP in [id number] systolic- con't regimen 10:  COPD: Continue Anoro Ellipta daily.  Flutter valve qid.  --Will check CXR due to decreased BS/SOB and ice chips.   --Continue to taper prednisone. --Duoneb prn 5/27- SOB this AM- responded well to albuterol treatment given 5/28- added  pulmicort nebs per Resp therapy- and also has albuterol scheduled 11: Alcohol abuse: cessation counseling. Off phenobarb. Check Thiamine level.  --continue B12, thiamine and folic acid 12: Tobacco abuse: cessation counseling --continue nicotine patch 13: Hyperlipidemia: LDL- 192 and Trig- 499-->continue Crestor 40 mg 14: AKI: Baseline SCr- 1.36-->2.39-->1.57  --Encourage --drinking honey liquids but not touching puree's.              --continue water flushes qid via cortak --Recheck renal status in am.  5/27- Cr down to 1.26 and BUN 39- better- con't to monitor 15. Vitamin C deficiency: On daily supplement 16. Vitamin D deficiency: Continue weekly supplement.   17. 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 RUE swelling/weeping  5/28- will wrap with kerlex and ACE wrap for some compression to help edema- since on Heparin gtt for DVT.     I spent a total of  35  minutes on total care today- >50% coordination of  care- due to d/w Resp therapy and nursing about pt.    LOS: 3 days A FACE TO FACE EVALUATION WAS PERFORMED  Ronnie Jones 01/03/2022, 9:46 AM

## 2022-01-04 ENCOUNTER — Inpatient Hospital Stay (HOSPITAL_COMMUNITY): Payer: Medicare HMO

## 2022-01-04 DIAGNOSIS — I619 Nontraumatic intracerebral hemorrhage, unspecified: Secondary | ICD-10-CM | POA: Diagnosis not present

## 2022-01-04 LAB — BASIC METABOLIC PANEL
Anion gap: 10 (ref 5–15)
BUN: 61 mg/dL — ABNORMAL HIGH (ref 8–23)
CO2: 21 mmol/L — ABNORMAL LOW (ref 22–32)
Calcium: 8.6 mg/dL — ABNORMAL LOW (ref 8.9–10.3)
Chloride: 103 mmol/L (ref 98–111)
Creatinine, Ser: 1.97 mg/dL — ABNORMAL HIGH (ref 0.61–1.24)
GFR, Estimated: 36 mL/min — ABNORMAL LOW (ref >60)
Glucose, Bld: 152 mg/dL — ABNORMAL HIGH (ref 70–99)
Potassium: 4.6 mmol/L (ref 3.5–5.1)
Sodium: 134 mmol/L — ABNORMAL LOW (ref 135–145)

## 2022-01-04 LAB — CBC
HCT: 39.6 % (ref 39.0–52.0)
Hemoglobin: 13.1 g/dL (ref 13.0–17.0)
MCH: 30 pg (ref 26.0–34.0)
MCHC: 33.1 g/dL (ref 30.0–36.0)
MCV: 90.6 fL (ref 80.0–100.0)
Platelets: 169 10*3/uL (ref 150–400)
RBC: 4.37 MIL/uL (ref 4.22–5.81)
RDW: 13.2 % (ref 11.5–15.5)
WBC: 11.9 10*3/uL — ABNORMAL HIGH (ref 4.0–10.5)
nRBC: 0 % (ref 0.0–0.2)

## 2022-01-04 LAB — GLUCOSE, CAPILLARY
Glucose-Capillary: 172 mg/dL — ABNORMAL HIGH (ref 70–99)
Glucose-Capillary: 155 mg/dL — ABNORMAL HIGH (ref 70–99)
Glucose-Capillary: 165 mg/dL — ABNORMAL HIGH (ref 70–99)

## 2022-01-04 LAB — HEPARIN LEVEL (UNFRACTIONATED): Heparin Unfractionated: 0.32 IU/mL (ref 0.30–0.70)

## 2022-01-04 IMAGING — CR DG ABDOMEN 1V
2 series · 2 of 2 positions shown · non-contrast
Comparison: [DATE]

CLINICAL DATA: Abdominal distension

EXAM:
ABDOMEN - 1 VIEW

[abdomen kub (1 of 2)]
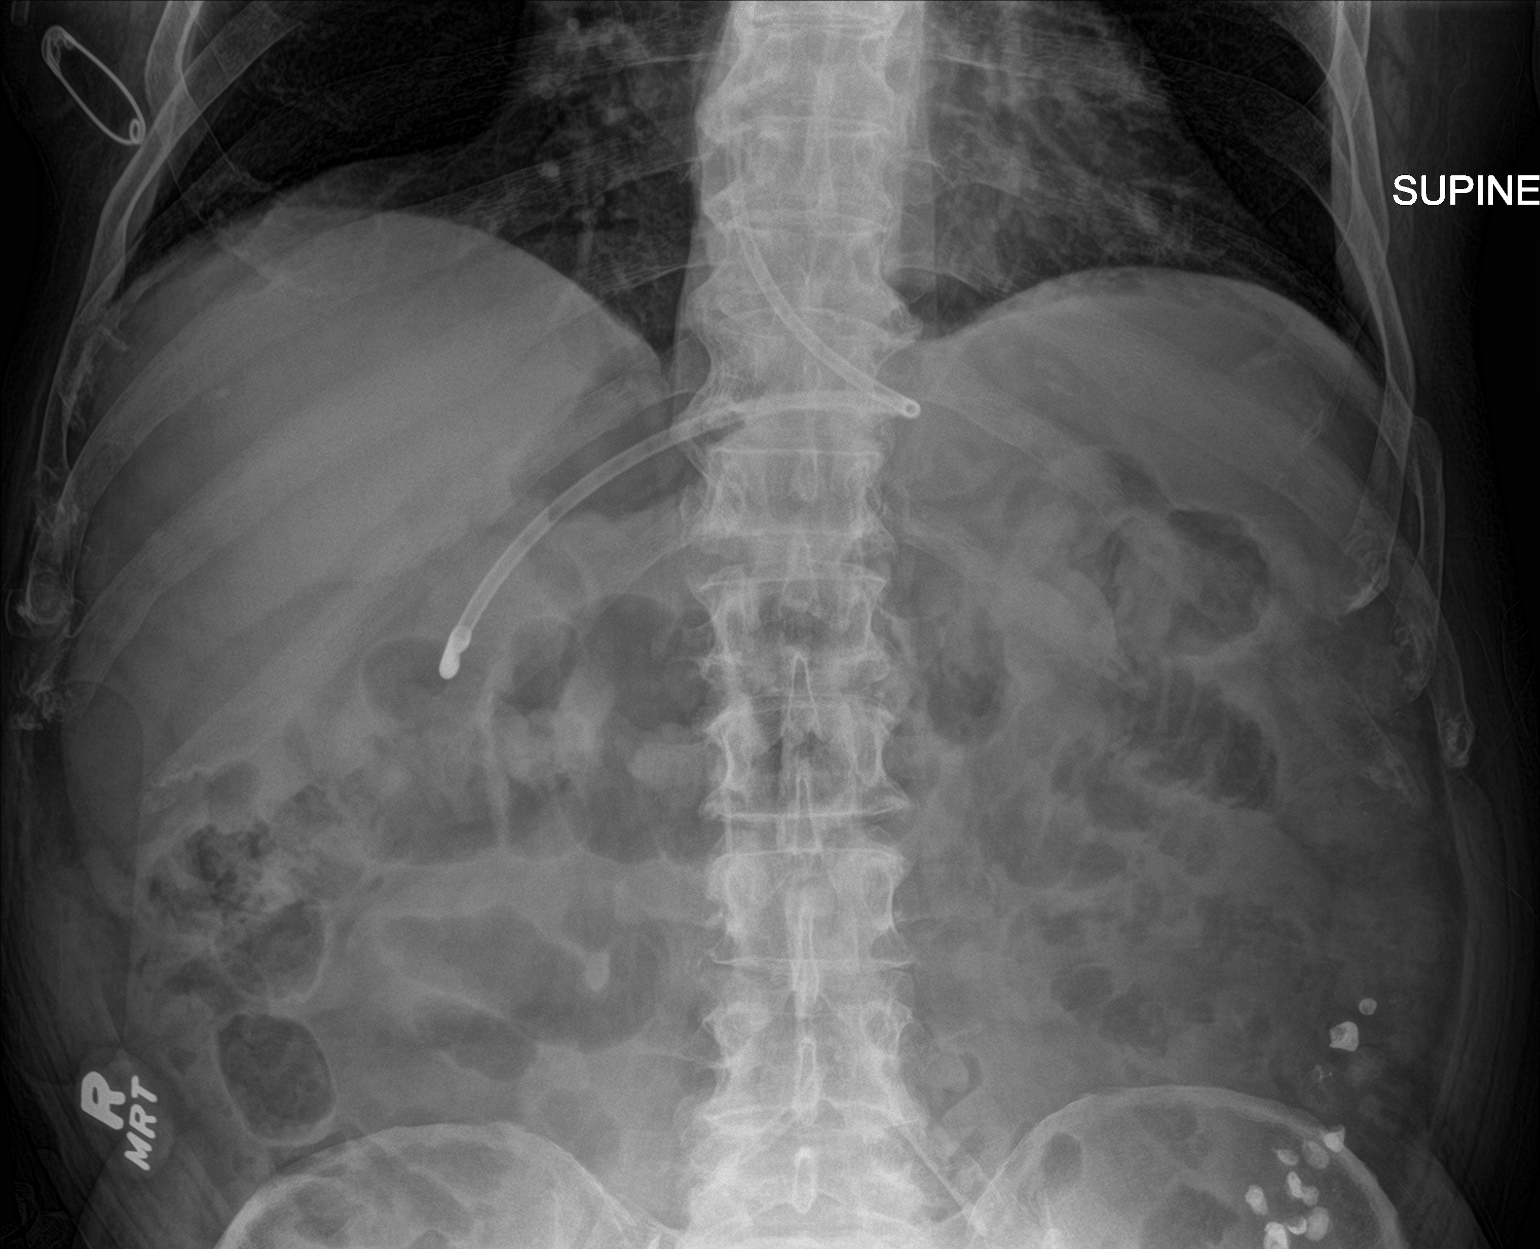

[abdomen kub (2 of 2)]
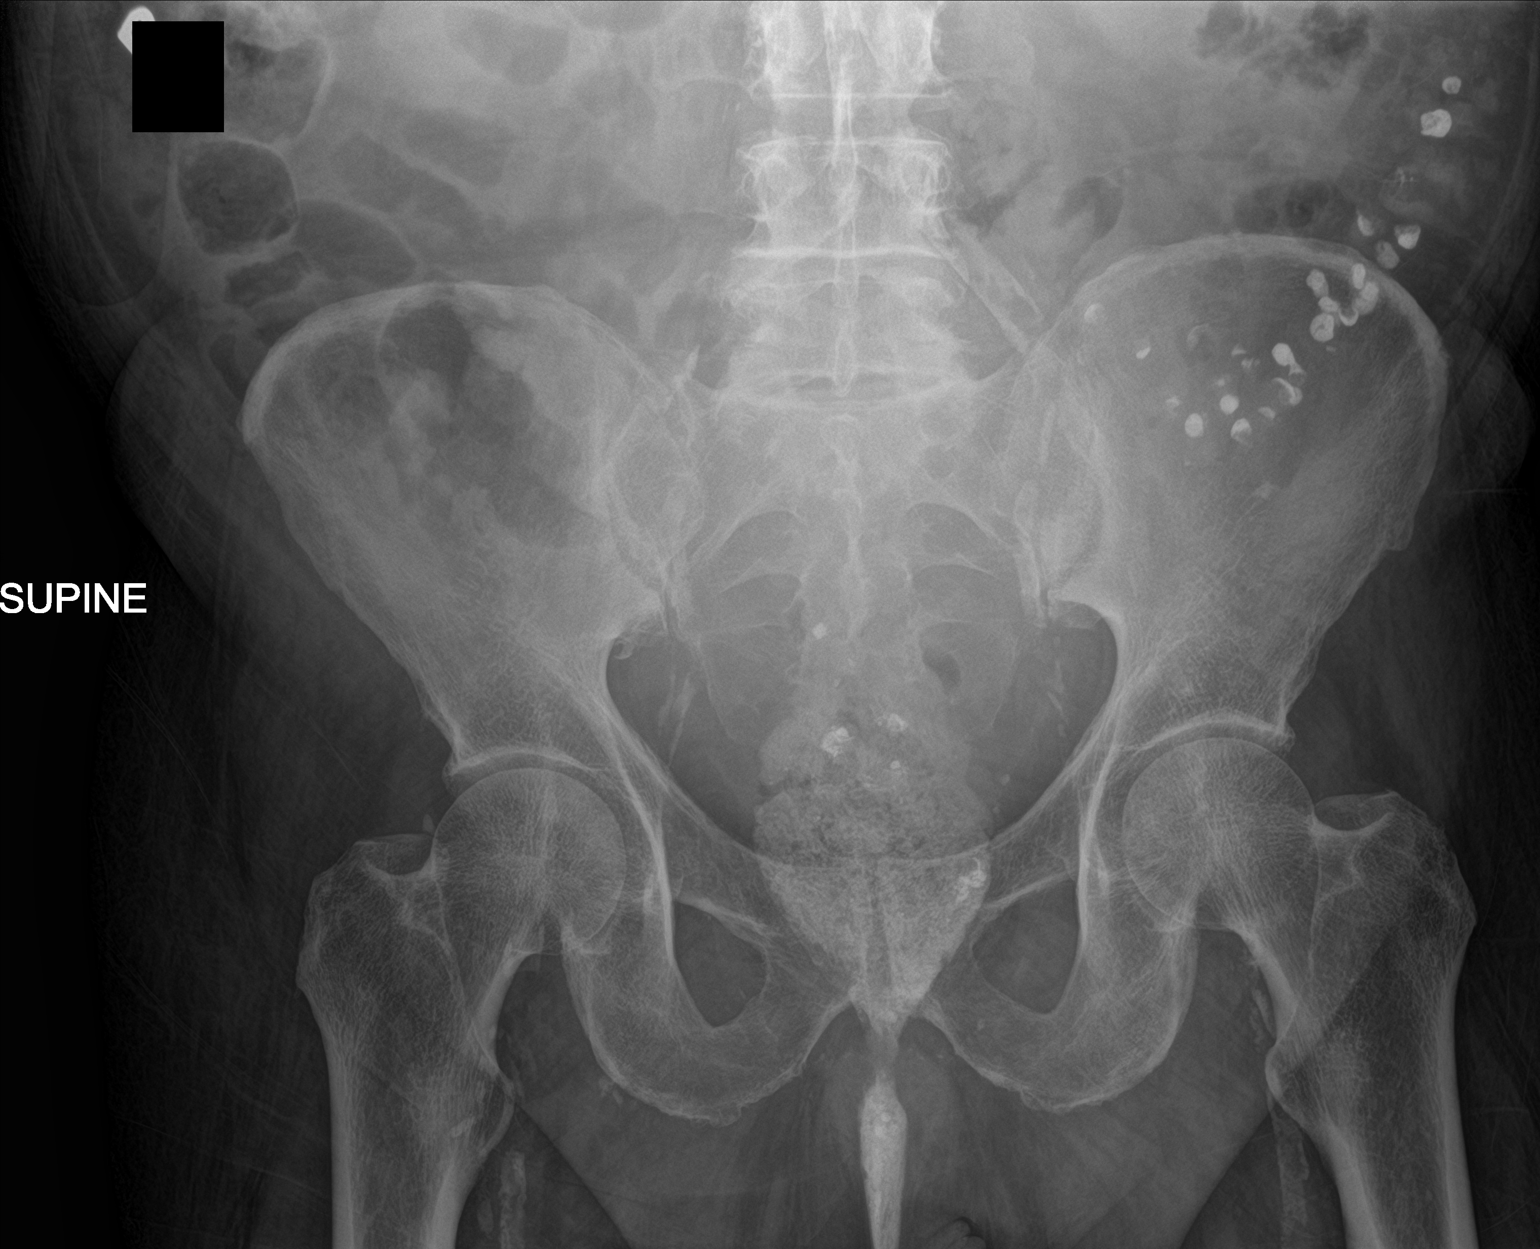

[2 of 2 positions shown; findings below may reference images not displayed]

FINDINGS: A feeding tube terminates in the right side of the abdomen, probably
in the proximal duodenum. Air-filled loops of large and small bowel
are normal in caliber. No free air, portal venous gas, or
pneumatosis.
IMPRESSION: Air-filled loops of large and small bowel are normal in caliber. No
evidence of obstruction or ileus.

The distal tip of the feeding tube is in the second portion of the
duodenum.

## 2022-01-04 MED ORDER — HYDROCORTISONE ACETATE 25 MG RE SUPP
25.0000 mg | Freq: Two times a day (BID) | RECTAL | Status: DC
  Administered 2022-01-04 – 2022-01-21 (×25): 25 mg via RECTAL
  Filled 2022-01-04 (×29): qty 1

## 2022-01-04 MED ORDER — POLYETHYLENE GLYCOL 3350 17 G PO PACK
17.0000 g | PACK | ORAL | Status: AC
Start: 2022-01-04 — End: 2022-01-04
  Administered 2022-01-04 (×2): 17 g via ORAL
  Filled 2022-01-04 (×2): qty 1

## 2022-01-04 MED ORDER — POLYETHYLENE GLYCOL 3350 17 G PO PACK
17.0000 g | PACK | Freq: Every day | ORAL | Status: DC
  Administered 2022-01-05 – 2022-01-06 (×2): 17 g via ORAL
  Filled 2022-01-04 (×2): qty 1

## 2022-01-04 MED ORDER — FLEET ENEMA 7-19 GM/118ML RE ENEM: 1.0000 | ENEMA | Freq: Every day | RECTAL | Status: DC | PRN

## 2022-01-04 MED ORDER — NEPRO/CARBSTEADY PO LIQD
80.0000 mL | ORAL | Status: DC
  Filled 2022-01-04: qty 237
  Filled 2022-01-04: qty 1000
  Filled 2022-01-04: qty 237

## 2022-01-04 MED ORDER — LIDOCAINE HCL URETHRAL/MUCOSAL 2 % EX GEL
Freq: Once | CUTANEOUS | Status: AC
  Filled 2022-01-04: qty 6

## 2022-01-04 MED ORDER — NEPRO/CARBSTEADY PO LIQD
800.0000 mL | ORAL | Status: DC
  Administered 2022-01-04 – 2022-01-05 (×2): 800 mL
  Filled 2022-01-04: qty 948

## 2022-01-04 NOTE — Progress Notes (Signed)
Physical Therapy Session Note  Patient Details  Name: Ronnie Jones MRN: [id number] Date of Birth: 1951-07-06  Today's Date: 01/04/2022 PT Individual Time: 1417-1500 PT Individual Time Calculation (min): 43 min   Short Term Goals: {[id number]}  Skilled Therapeutic Interventions/Progress Updates:      Therapy Documentation Precautions:  Precautions Precautions: Fall Precaution Comments: dense R hemiparesis Restrictions Weight Bearing Restrictions: No General:   Vital Signs: Therapy Vitals Temp: 97.8 F (36.6 C) Temp Source: Oral Pulse Rate: 74 Resp: 20 BP: 134/80 Patient Position (if appropriate): Lying Oxygen Therapy SpO2: 98 % O2 Device: Room Air Pain:   Mobility:   Locomotion :    Trunk/Postural Assessment :    Balance:   Exercises:   Other Treatments:      Therapy/Group: {Therapy/Group:[id number]}  Loel Dubonnet 01/04/2022, 5:18 PM

## 2022-01-04 NOTE — Progress Notes (Signed)
ANTICOAGULATION CONSULT NOTE - Follow Up Consult  Pharmacy Consult for Heparin Indication: DVT  Allergies  Allergen Reactions   Codeine Nausea Only    Patient Measurements: Height: 5\' 7"  (170.2 cm) Weight: 76.8 kg (169 lb 5 oz) IBW/kg (Calculated) : 66.1 kg Heparin Dosing Weight: 77 kg  Vital Signs: Temp: 97.9 F (36.6 C) (05/29 0453) Temp Source: Oral (05/28 2017) BP: 162/73 (05/29 0453) Pulse Rate: 86 (05/29 0453)  Labs: Recent Labs    01/02/22 0510 01/02/22 1556 01/03/22 0535 01/03/22 1326 01/03/22 2132 01/04/22 0554  HGB 13.5  --  14.0  --   --  13.1  HCT 41.4  --  42.8  --   --  39.6  PLT 182  --  174  --   --  169  HEPARINUNFRC 0.51   < > 0.20* 0.26* 0.34 0.32  CREATININE  --   --   --   --   --  1.97*   < > = values in this interval not displayed.     Estimated Creatinine Clearance: 33.6 mL/min (A) (by C-G formula based on SCr of 1.97 mg/dL (H)).   Medications:  Scheduled:   amLODipine  10 mg Oral Daily   vitamin C  500 mg Oral BID   atenolol  50 mg Oral Daily   budesonide (PULMICORT) nebulizer solution  0.5 mg Nebulization BID   chlorhexidine  15 mL Mouth Rinse BID   feeding supplement (JEVITY 1.5 CAL/FIBER)  1,120 mL Per Tube Q24H   feeding supplement (PROSource TF)  45 mL Per Tube BID   folic acid  1 mg Oral Daily   free water  200 mL Per Tube Q4H   hydrALAZINE  100 mg Oral Q8H   insulin aspart  0-5 Units Subcutaneous QHS   insulin aspart  0-9 Units Subcutaneous TID WC   ipratropium-albuterol  3 mL Nebulization Q6H   melatonin  3 mg Oral QHS   nicotine  21 mg Transdermal Daily   pantoprazole sodium  40 mg Oral Daily   predniSONE  20 mg Oral Q breakfast   Followed by   01/06/22 ON 01/06/2022] predniSONE  10 mg Oral Q breakfast   Followed by   01/08/2022 ON 01/09/2022] predniSONE  5 mg Oral Q breakfast   rosuvastatin  40 mg Oral QHS   senna-docusate  1 tablet Oral BID   thiamine  100 mg Oral Daily   tiZANidine  2 mg Oral TID    umeclidinium-vilanterol  1 puff Inhalation Daily   Vitamin D (Ergocalciferol)  50,000 Units Oral Q7 days   Infusions:   heparin 1,050 Units/hr (01/04/22 0122)   PRN: acetaminophen, albuterol, alum & mag hydroxide-simeth, bisacodyl, diphenhydrAMINE, guaiFENesin-dextromethorphan, magnesium hydroxide, methocarbamol, prochlorperazine **OR** prochlorperazine **OR** prochlorperazine, sodium phosphate, traZODone  Assessment: 71 yo male presents with left subcortical IPH and is now found to have an 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 PTA the patient is not on anticoagulation. Pharmacy is consulted to dose heparin.   Heparin level is therapeutic at 0.32 while running at 1050 units/hr. No issues with the infusion or bleeding noted. CBC stable with Hgb at 13.1 and PLT 169.  Goal of Therapy:  Heparin level 0.3-0.5 units/ml - no bolus Monitor platelets by anticoagulation protocol: Yes   Plan:  Continue heparin IV at 1050 units/hr Monitor for signs and symptoms of bleeding Heparin level and CBC daily   71, Pharm.D. PGY-1 Pharmacy Resident Phone:[phone] 01/04/2022 7:20 AM

## 2022-01-04 NOTE — Progress Notes (Signed)
PROGRESS NOTE   Subjective/Complaints:  Hgb has remained stable on heparin MS at baseline post CVA  Labs reviewed BUN/Creat rising Per RN abd more distended   Denies any issues.  Nurse reports a lot of weeping from RUE- wrapped with kerlex- elbow swollen especially.    ROS: Limited by sedation/aphasia this AM  Objective:   No results found.  Recent Labs    01/03/22 0535 01/04/22 0554  WBC 11.0* 11.9*  HGB 14.0 13.1  HCT 42.8 39.6  PLT 174 169    Recent Labs    01/04/22 0554  NA 134*  K 4.6  CL 103  CO2 21*  GLUCOSE 152*  BUN 61*  CREATININE 1.97*  CALCIUM 8.6*     Intake/Output Summary (Last 24 hours) at 01/04/2022 0815 Last data filed at 01/04/2022 0500 Gross per 24 hour  Intake 236 ml  Output 900 ml  Net -664 ml         Physical Exam: Vital Signs Blood pressure (!) 162/73, pulse 86, temperature 97.9 F (36.6 C), resp. rate 18, height 5\' 7"  (1.702 m), weight 76.8 kg, SpO2 96 %.   General: No acute distress Mood and affect are appropriate Heart: Regular rate and rhythm no rubs murmurs or extra sounds Lungs: Clear to auscultation, breathing unlabored, no rales or wheezes Abdomen: Positive bowel sounds, soft nontender to palpation,+distended Extremities: No clubbing, cyanosis, or edema Skin: No evidence of breakdown, no evidence of rash Neurologic: Cranial nerves II through XII intact, motor strength is 5/5 in Left 2- RIght deltoid, bicep, tricep, grip, hip flexor, knee extensors, ankle dorsiflexor and plantar flexor   Skin: RUE- wrapped in kerlex Extremities: 1+ RLE edema; 1+ pitting edema RUE Assessment/Plan: 1. Functional deficits which require 3+ hours per day of interdisciplinary therapy in a comprehensive inpatient rehab setting. Physiatrist is providing close team supervision and 24 hour management of active medical problems listed below. Physiatrist and rehab team continue to  assess barriers to discharge/monitor patient progress toward functional and medical goals  Care Tool:  Bathing  Bathing activity did not occur: Refused Body parts bathed by patient: Face         Bathing assist Assist Level: Supervision/Verbal cueing     Upper Body Dressing/Undressing Upper body dressing   What is the patient wearing?: Hospital gown only    Upper body assist Assist Level: Total Assistance - Patient < 25%    Lower Body Dressing/Undressing Lower body dressing      What is the patient wearing?: Pants     Lower body assist Assist for lower body dressing: Total Assistance - Patient < 25%     Toileting Toileting    Toileting assist Assist for toileting: Total Assistance - Patient < 25%     Transfers Chair/bed transfer  Transfers assist     Chair/bed transfer assist level: Dependent - mechanical lift     Locomotion Ambulation   Ambulation assist   Ambulation activity did not occur: Safety/medical concerns          Walk 10 feet activity   Assist  Walk 10 feet activity did not occur: Safety/medical concerns        Walk 50  feet activity   Assist Walk 50 feet with 2 turns activity did not occur: Safety/medical concerns         Walk 150 feet activity   Assist Walk 150 feet activity did not occur: Safety/medical concerns         Walk 10 feet on uneven surface  activity   Assist Walk 10 feet on uneven surfaces activity did not occur: Safety/medical concerns         Wheelchair     Assist Is the patient using a wheelchair?: Yes Type of Wheelchair: Manual    Wheelchair assist level: Dependent - Patient 0% Max wheelchair distance: 150    Wheelchair 50 feet with 2 turns activity    Assist        Assist Level: Dependent - Patient 0%   Wheelchair 150 feet activity     Assist      Assist Level: Dependent - Patient 0%   Blood pressure (!) 162/73, pulse 86, temperature 97.9 F (36.6 C), resp.  rate 18, height 5\' 7"  (1.702 m), weight 76.8 kg, SpO2 96 %.  Medical Problem List and Plan: 1. 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  Antithrombotics: -DVT/anticoagulation:  Pharmaceutical: Heparin  5/27- R brachial DVT- placed on Heparin gtt with no bolus per Dr Pearlean BrownieSethi- pharmacy managing.   5/28- Hb stable-no signs of additonal stroke/bleeding so far.              -antiplatelet therapy: none 3. Pain Management: Tylenol as needed 4. Mood: LCSW to evaluate and provide emotional support             -antipsychotic agents: n/a 5. Neuropsych: This patient is capable of making decisions on his own behalf. 6. Skin/Wound Care: Routine skin care checks 7. Fluids/Electrolytes/Nutrition: Routine Is and Os and follow-up chemistries             --dysphagia 1; honey thick- Has Cortrak in place and receiving TF's.  8: T2DM: Hgb A1c- metformin on hold due to AKI/variable intake (puree diet). --Monitor BS ac/hs and use SSI for elevated BS. --Change TF to 6 pm-6 am CBG (last 3)  Recent Labs    01/03/22 1621 01/03/22 2039 01/04/22 0622  GLUCAP 148* 149* 172*   5/28- CBGs look better- con't regimen 9: Hypertension: Off Lisinopril due to AKI/hyperkalemia  --continue amlodipine, atenolol, hydralazine Vitals:   01/04/22 0808 01/04/22 0809  BP:    Pulse:    Resp:    Temp:    SpO2: 96% 96%  Elevated systolic , starting tizanidine for spasticity which may lower BP will monitor  5/27- BP 160s systolic- might add meds tomorrow? 5/28- BP in 120s systolic- con't regimen 10:  COPD: Continue Anoro Ellipta daily.  Flutter valve qid. Smoked 2ppd - CXR OK  --Continue to taper prednisone. --Duoneb prn 5/27- SOB this AM- responded well to albuterol treatment  given 5/28- added pulmicort nebs per Resp therapy- and also has albuterol scheduled 11: Alcohol abuse: cessation counseling. Off phenobarb. Check Thiamine level.  --continue B12, thiamine and folic acid 12: Tobacco abuse: cessation counseling --continue nicotine patch 13: Hyperlipidemia: LDL- 192 and Trig- 499-->continue Crestor 40 mg 14:  AKI may bhave CKD : Baseline SCr-?~1.36 --Encourage --drinking honey liquids but not touching puree's.              --continue water flushes qid via cortak -Creat bumped up to 1.97- cont H2O flushes q 4 Na+ 134, hesitate increasing this , alb a little low but doubt significant 3rd spacing 15. Vitamin C deficiency: On daily supplement 16. Vitamin D deficiency: Continue weekly supplement.   17. Spasticity- developing spasticity already in RUE- might benefit from Botox if possible On tizanidine 2mg  TID - may be contributing to tiredness  18. RUE swelling/weeping  Brachial DVT + weakness from CVA with dependant edema    19.  Abd distension good bowel sound , fleets today will check KUB    LOS: 4 days A FACE TO FACE EVALUATION WAS PERFORMED  01/04/2022, 8:15 AM

## 2022-01-04 NOTE — Progress Notes (Signed)
Occupational Therapy Session Note  Patient Details  Name: Ronnie Jones MRN: [id number] Date of Birth: 12/08/1950  Today's Date: 01/04/2022 OT Individual Time: [id number] OT Individual Time Calculation (min): 35 min    Short Term Goals: Week 1:  OT Short Term Goal 1 (Week 1): Pt will report for 2 consecutive sessions < 3/10 pain OT Short Term Goal 2 (Week 1): Pt will sit EOB at least 3 mins during ADL task to promote OOB tolerance OT Short Term Goal 3 (Week 1): Pt will complete 1 step of dressing task at bed level with no more than min verbal cues  Skilled Therapeutic Interventions/Progress Updates:     S: Pt reports that he did not sleep last night. Initially refused to participate. Pt did agree to a shortened session.   O: Grooming: With OT providing set-up of washcloth/supplies, patient used his left hand to wash his face at bed level.  UB dressing: Pt wearing hospital gown. Total Assist for doffing and donning new hospital gown at bed level. OT donned right resting wrist brace to provide better positioning and decrease wrist flexion while at rest. UB bathing: Pt dependent for UB bathing at bed level. RUE flexor synergy noted during bathing causing limited passive shoulder abduction to bath armpit and apply deodorant. Positioning: OT Provided total assist to boost patient in bed. Max assist needed to repositioning shoulders. Pillow placed for patient head. Pillow also placed under RUE for ideal positioning while in bed.     A: Pt sleeping upon therapy arrival. He did awaken with verbal calling of his name. Initially refused participation in therapy stating that he did not sleep last night. He did agree to participate in a shortened session at bed level. Therapist facilitated pt's activity tolerance in order to increase participation in self-care task at bed level by providing verbal prompts, rest breaks, and a slower activity pace.    P: Work on increasing activity tolerance while  completing a self care task while seated on EOB.   Therapy Documentation Precautions:  Precautions Precautions: Fall Precaution Comments: dense R hemiparesis Restrictions Weight Bearing Restrictions: No General: General OT Amount of Missed Time: 25 Minutes PT Missed Treatment Reason: Patient fatigue;Pt refused  Pain: Pain Assessment Pain Scale: 0-10 Pain Score: 0-No pain   Therapy/Group: Individual Therapy  Ailene Ravel, OTR/L,CBIS  Supplemental OT - [facility]  01/04/2022, 12:26 PM

## 2022-01-04 NOTE — Progress Notes (Signed)
Pt with complaint of abdominal discomfort at the beginning of the shift. Abdomen noted to be firm and distended with +bowel sounds. Dr. Wynn Banker made aware. New orders obtained. Pt assisted to bathroom and administered Fleets enema with moderate results and pt given dulcolax suppository at 1037. Pt assisted to bathroom again at 1230 with no results. [name] PA ordered soap suds enema and was given at 1300. Small amount of Mucous noted with soap suds, otherwise no results.No results. PA made aware. New orders obtained for miralax. Pt fatigued from bathroom trips with 2+stedy assist. Noted pt with heavy breathing and wheezing. PRN Breathing treatment provided. No other complaints.   Marylu Lund, RN

## 2022-01-04 NOTE — Progress Notes (Signed)
Speech Language Pathology Daily Session Note  Patient Details  Name: Ronnie Jones MRN: [id number] Date of Birth: March 06, 1951  Today's Date: 01/04/2022 SLP Individual Time: 0805-0900 SLP Individual Time Calculation (min): 55 min and Today's Date: 01/04/2022 SLP Missed Time: 5 Minutes Missed Time Reason: Other (Comment) (Breathing treatment)  Short Term Goals: Week 1: SLP Short Term Goal 1 (Week 1): Patient will consume current diet without overt s/sx of aspiration with mod A verbal cues for use of swallowing compensatory strategies SLP Short Term Goal 2 (Week 1): Patient will be oriented x4 with mod A for use of external aids SLP Short Term Goal 3 (Week 1): Patient will implement speech intelligbility strategies at the word level with mod A verbal cues SLP Short Term Goal 4 (Week 1): Patient will complete basic problem solving with mod A verval/visual cues SLP Short Term Goal 5 (Week 1): Patient will exhibit recall of recent information with mod A verbal/visual cues for use of external aids SLP Short Term Goal 6 (Week 1): Patient will participate in further cognitive-linguistic evaluation  Skilled Therapeutic Interventions: Skilled ST treatment focused on cognitive-communication and swallowing goals. Patient was sleeping on arrival and required extensive time and moderate verbal and tactile cues to rouse. Pt remained lethargic during session. Pt reported feeling "awful" today and that he couldn't breath. Had just received breathing treatment prior to arrival. SLP assessed vitals - spO2 97%, respirations 18; asymptomatic. Alertness improved and pt willing to participate in further cognitive assessment via SLUMS. Pt scored 7/26 with score of 27 or higher considered within the normal range. Score was modified to omit clock drawing d/t dominant hand weakness impacting writing. Pt exhibiting deficits in orientation, attention, problem solving, short-term recall (storage + retrieval), and intellectual  awareness. Prior to assessment pt described cognition as "good" with no change from baseline. However, following assessment pt exhibited increased intellectual awareness and acknowledged performance was much different than baseline. Pt consumed average size sips of honey thick liquids by cup without overt s/sx of aspiration. Pt declined breakfast d/t poor appetite. SLP educated on speech intelligibility strategies using "A BOSS" acronym (Amplify, Breath Support, Over-Articulate, Slow, Separate Words). Pt implemented at the word level with mod A verbal and modeling cues to increase intelligibility from 50% to 75%. Patient was left in bed with alarm activated and immediate needs within reach at end of session. Continue per current plan of care.      Pain  None/denied  Therapy/Group: Individual Therapy  Tamala Ser 01/04/2022, 8:07 AM

## 2022-01-04 NOTE — Progress Notes (Signed)
Physical Therapy Session Note  Patient Details  Name: Ronnie Jones MRN: [id number] Date of Birth: Sep 05, 1950  Today's Date: 01/04/2022 PT Individual Time: 1032-1105 PT Individual Time Calculation (min): 33 min  and Today's Date: 01/04/2022 PT Missed Time: 27 Minutes Missed Time Reason: Patient fatigue;Nursing care;Toileting  Short Term Goals: Week 1:  PT Short Term Goal 1 (Week 1): Pt will tolerate sitting in WC >2 hours between therapy PT Short Term Goal 2 (Week 1): Pt will transfer to Bed with max assist of 1 consistently PT Short Term Goal 3 (Week 1): Pt will ambulate 85ft with max assist + 2  Skilled Therapeutic Interventions/Progress Updates:  Patient supine in bed on entrance to room. Wife present. Patient alert and agreeable to PT session. Pt returning from toilet following severe loss of loose stool with nursing. Pt's abdomen is extended with minimal pain related by pt. Pt very fatigued.   RN reports pt has also had a new laxative that may move his bowels again in ~13min. RN provides brief breathing treatment per resp therapy/ MD. Guided in regular to deep breaths  for treatment. During breathing treatment, discussed pt and progress with wife. Discussed fact of no results from imaging as of yet. Also went over wear of PRAFO for prevention of PF contracture, maintaining neutral hip , knee and ankle alignment. Pt's R foot cold and TEDs as well as grip socks donned TotA.   Patient with no pain complaint throughout session. Does relate numbness in RLE with paresthesia.   Therapeutic Activity: Bed Mobility: Patient performed bridging with MinA and hold to RLE to maintain hooklying. Performed 2x5 for improved strengthening and NMR. VC/ tc required for technique and effort.  Neuromuscular Re-ed: NMR facilitated during session with focus on muscle fiber recruitment and activation. Pt guided in heel slides to max range then push into extension. Pt demos improvement during movement in  activation for heel slide. Improving activation in RLE for extension. Then AAROM for hip abd/ add. Guided in quad sets that pt can perform in supine while in bed. NMR performed for improvements in motor control and coordination, balance, sequencing, judgement, and self confidence/ efficacy in performing all aspects of mobility at highest level of independence.   Patient supine  in bed at end of session with brakes locked, bed alarm set, and all needs within reach. Wife in room.    Therapy Documentation Precautions:  Precautions Precautions: Fall Precaution Comments: dense R hemiparesis Restrictions Weight Bearing Restrictions: No General:   Vital Signs: Therapy Vitals Temp: 97.9 F (36.6 C) Pulse Rate: 86 Resp: 18 BP: (!) 162/73 Patient Position (if appropriate): Lying Oxygen Therapy SpO2: 97 % O2 Device: Room Air Pain:   Mobility:   Locomotion :    Trunk/Postural Assessment :    Balance:   Exercises:   Other Treatments:      Therapy/Group: Individual Therapy  Loel Dubonnet PT, DPT 01/04/2022, 7:53 AM

## 2022-01-05 DIAGNOSIS — I619 Nontraumatic intracerebral hemorrhage, unspecified: Secondary | ICD-10-CM | POA: Diagnosis not present

## 2022-01-05 LAB — GLUCOSE, CAPILLARY
Glucose-Capillary: 115 mg/dL — ABNORMAL HIGH (ref 70–99)
Glucose-Capillary: 141 mg/dL — ABNORMAL HIGH (ref 70–99)
Glucose-Capillary: 169 mg/dL — ABNORMAL HIGH (ref 70–99)
Glucose-Capillary: 158 mg/dL — ABNORMAL HIGH (ref 70–99)
Glucose-Capillary: 107 mg/dL — ABNORMAL HIGH (ref 70–99)

## 2022-01-05 LAB — CBC
HCT: 36.1 % — ABNORMAL LOW (ref 39.0–52.0)
Hemoglobin: 12.4 g/dL — ABNORMAL LOW (ref 13.0–17.0)
MCH: 30.6 pg (ref 26.0–34.0)
MCHC: 34.3 g/dL (ref 30.0–36.0)
MCV: 89.1 fL (ref 80.0–100.0)
Platelets: 172 10*3/uL (ref 150–400)
RBC: 4.05 MIL/uL — ABNORMAL LOW (ref 4.22–5.81)
RDW: 13.1 % (ref 11.5–15.5)
WBC: 12.5 10*3/uL — ABNORMAL HIGH (ref 4.0–10.5)
nRBC: 0 % (ref 0.0–0.2)

## 2022-01-05 LAB — HEPARIN LEVEL (UNFRACTIONATED): Heparin Unfractionated: 0.23 IU/mL — ABNORMAL LOW (ref 0.30–0.70)

## 2022-01-05 MED ORDER — SORBITOL 70 % SOLN
30.0000 mL | Freq: Every day | Status: DC | PRN
  Administered 2022-01-05: 30 mL via ORAL
  Filled 2022-01-05: qty 30

## 2022-01-05 MED ORDER — SORBITOL 70 % SOLN
300.0000 mL | TOPICAL_OIL | Freq: Once | ORAL | Status: AC
  Administered 2022-01-05: 300 mL via RECTAL
  Filled 2022-01-05: qty 90

## 2022-01-05 NOTE — Progress Notes (Signed)
ANTICOAGULATION CONSULT NOTE - Follow Up Consult  Pharmacy Consult for Heparin Indication: DVT  Allergies  Allergen Reactions   Codeine Nausea Only    Patient Measurements: Height: 5\' 7"  (170.2 cm) Weight: 78.9 kg (173 lb 15.1 oz) IBW/kg (Calculated) : 66.1 kg Heparin Dosing Weight: 77 kg  Vital Signs: Temp: 97.7 F (36.5 C) (05/30 0431) BP: 159/72 (05/30 0431) Pulse Rate: 84 (05/30 0431)  Labs: Recent Labs    01/03/22 0535 01/03/22 1326 01/04/22 0554 01/05/22 0608 01/05/22 0715  HGB 14.0  --  13.1 12.4*  --   HCT 42.8  --  39.6 36.1*  --   PLT 174  --  169 172  --   HEPARINUNFRC 0.20*   < > 0.32 QUESTIONABLE RESULTS, RECOMMEND RECOLLECT TO VERIFY 0.23*  CREATININE  --   --  1.97*  --   --    < > = values in this interval not displayed.     Estimated Creatinine Clearance: 33.6 mL/min (A) (by C-G formula based on SCr of 1.97 mg/dL (H)).     Assessment: 71 yo male presents with left subcortical IPH and is now found to have an 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 PTA the patient is not on anticoagulation. Pharmacy is consulted to dose heparin.  Heparin level low this AM   Goal of Therapy:  Heparin level 0.3-0.5 units/ml - no bolus Monitor platelets by anticoagulation protocol: Yes   Plan:  Increase heparin to 1200 units / hr Monitor for signs and symptoms of bleeding Heparin level and CBC daily   Thank you 73, PharmD 01/05/2022 9:00 AM

## 2022-01-05 NOTE — Progress Notes (Signed)
Pt received smog enema with moderate results. Tolerated well with no complaints.

## 2022-01-05 NOTE — Progress Notes (Signed)
Physical Therapy Session Note  Patient Details  Name: Ronnie Jones MRN: [id number] Date of Birth: 07/14/1951  Today's Date: 01/05/2022 PT Individual Time: 4481-8563 PT Individual Time Calculation (min): 58 min   Short Term Goals: Week 1:  PT Short Term Goal 1 (Week 1): Pt will tolerate sitting in WC >2 hours between therapy PT Short Term Goal 2 (Week 1): Pt will transfer to Bed with max assist of 1 consistently PT Short Term Goal 3 (Week 1): Pt will ambulate 63ft with max assist + 2  Skilled Therapeutic Interventions/Progress Updates:  Patient seated upright in TIS w/c on entrance to room. Patient alert and agreeable to PT session.   Patient with no pain complaint throughout session.  Therapeutic Activity: Transfers: Patient performed sit<>stand transfers throughout session with MinA using HR or Mod/ MaxA using RW. Unable to perform with push-to-stand and attempts to pull at [facility]. Provided verbal cues for forward lean, and increased effort in technique.   Gait Training:  Patient ambulated 20 ft with DF wrap using LHR with MaxA for upright stance and RLE advancement/ placement. Demonstrated increased RLE flexion in stance with DF wrap. Potential need for AFO to increase quad activation. Provided vc/ tc for sequencing, weight shifting, technique.  Neuromuscular Re-ed: NMR facilitated during session with focus on standing balance and sit<>stand technique. Pt guided in NDT facilitation. Requires MaxA initially for power up d/t inability to push from w/c armrest. Improved forward lean with blocked practice. Attempts to initiate but requires Mod/ MaxA for power up and once up, requires continued assist to maintain upright stance d/t heavy lean to R and quick fatigue in RLE.  Multiple standing bouts ~1-66min each d/t fatigue. Lateral weight shifting facilitated for joint approximation. NMR performed for improvements in motor control and coordination, balance, sequencing, judgement, and self  confidence/ efficacy in performing all aspects of mobility at highest level of independence.   Patient seated upright  in TIS w/c at end of session with brakes locked, no alarm set as pt sitting with wife and waiting for start of therapy lunch dining with OT and SLP. All needs within reach.   Therapy Documentation Precautions:  Precautions Precautions: Fall Precaution Comments: dense R hemiparesis Restrictions Weight Bearing Restrictions: No General:   Pain: Pain Assessment Pain Scale: Faces Faces Pain Scale: No hurt  Therapy/Group: Individual Therapy  Loel Dubonnet PT, DPT 01/05/2022, 10:09 AM

## 2022-01-05 NOTE — Progress Notes (Signed)
PROGRESS NOTE   Subjective/Complaints:  Large rectal stool ball, minimal results with SSE, MIralax  Denies any issues.  Nurse reports a lot of weeping from RUE- wrapped with kerlex- elbow swollen especially.    ROS: Limited by sedation/aphasia this AM  Objective:   DG Abd 1 View  Result Date: 01/04/2022 CLINICAL DATA:  Abdominal distension EXAM: ABDOMEN - 1 VIEW COMPARISON:  Jan 01, 2022 FINDINGS: A feeding tube terminates in the right side of the abdomen, probably in the proximal duodenum. Air-filled loops of large and small bowel are normal in caliber. No free air, portal venous gas, or pneumatosis. IMPRESSION: Air-filled loops of large and small bowel are normal in caliber. No evidence of obstruction or ileus. The distal tip of the feeding tube is in the second portion of the duodenum. Electronically Signed   By: Gerome Sam III M.D.   On: 01/04/2022 11:47    Recent Labs    01/04/22 0554 01/05/22 0608  WBC 11.9* 12.5*  HGB 13.1 12.4*  HCT 39.6 36.1*  PLT 169 172    Recent Labs    01/04/22 0554  NA 134*  K 4.6  CL 103  CO2 21*  GLUCOSE 152*  BUN 61*  CREATININE 1.97*  CALCIUM 8.6*     Intake/Output Summary (Last 24 hours) at 01/05/2022 0814 Last data filed at 01/05/2022 0530 Gross per 24 hour  Intake 1151.75 ml  Output 5402 ml  Net -4250.25 ml         Physical Exam: Vital Signs Blood pressure (!) 159/72, pulse 84, temperature 97.7 F (36.5 C), resp. rate 16, height 5\' 7"  (1.702 m), weight 78.9 kg, SpO2 95 %.   General: No acute distress Mood and affect are appropriate Heart: Regular rate and rhythm no rubs murmurs or extra sounds Lungs: Clear to auscultation, breathing unlabored, no rales or wheezes Abdomen: Positive bowel sounds, soft nontender to palpation,+distended Extremities: No clubbing, cyanosis, or edema Skin: No evidence of breakdown, no evidence of rash Neurologic: Cranial  nerves II through XII intact, motor strength is 5/5 in Left 3- RIght deltoid, bicep, tricep, trace grip, hip flexor, knee extensors, ankle dorsiflexor and plantar flexor   Skin: RUE- wrapped in kerlex Extremities: 1+ RLE edema; 2+ pitting edema RUE distal to wrist splint  Assessment/Plan: 1. Functional deficits which require 3+ hours per day of interdisciplinary therapy in a comprehensive inpatient rehab setting. Physiatrist is providing close team supervision and 24 hour management of active medical problems listed below. Physiatrist and rehab team continue to assess barriers to discharge/monitor patient progress toward functional and medical goals  Care Tool:  Bathing  Bathing activity did not occur: Refused Body parts bathed by patient: Face         Bathing assist Assist Level: Supervision/Verbal cueing     Upper Body Dressing/Undressing Upper body dressing   What is the patient wearing?: Hospital gown only    Upper body assist Assist Level: Total Assistance - Patient < 25%    Lower Body Dressing/Undressing Lower body dressing      What is the patient wearing?: Pants     Lower body assist Assist for lower body dressing: Total Assistance -  Patient < 25%     Toileting Toileting    Toileting assist Assist for toileting: Total Assistance - Patient < 25%     Transfers Chair/bed transfer  Transfers assist     Chair/bed transfer assist level: Dependent - mechanical lift     Locomotion Ambulation   Ambulation assist   Ambulation activity did not occur: Safety/medical concerns          Walk 10 feet activity   Assist  Walk 10 feet activity did not occur: Safety/medical concerns        Walk 50 feet activity   Assist Walk 50 feet with 2 turns activity did not occur: Safety/medical concerns         Walk 150 feet activity   Assist Walk 150 feet activity did not occur: Safety/medical concerns         Walk 10 feet on uneven surface   activity   Assist Walk 10 feet on uneven surfaces activity did not occur: Safety/medical concerns         Wheelchair     Assist Is the patient using a wheelchair?: Yes Type of Wheelchair: Manual    Wheelchair assist level: Dependent - Patient 0% Max wheelchair distance: 150    Wheelchair 50 feet with 2 turns activity    Assist        Assist Level: Dependent - Patient 0%   Wheelchair 150 feet activity     Assist      Assist Level: Dependent - Patient 0%   Blood pressure (!) 159/72, pulse 84, temperature 97.7 F (36.5 C), resp. rate 16, height 5\' 7"  (1.702 m), weight 78.9 kg, SpO2 95 %.  Medical Problem List and Plan: 1. 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  Antithrombotics: -DVT/anticoagulation:  Pharmaceutical: Heparin  5/27- R brachial DVT- placed on Heparin gtt with no bolus per Dr Pearlean BrownieSethi- pharmacy managing.   5/28- Hb stable-no signs of additonal stroke/bleeding so far.              -antiplatelet therapy: none 3. Pain Management: Tylenol as needed 4. Mood: LCSW to evaluate and provide emotional support             -antipsychotic agents: n/a 5. Neuropsych: This patient is capable of making decisions on his own behalf. 6. Skin/Wound Care: Routine skin care checks 7. Fluids/Electrolytes/Nutrition: Routine Is and Os and follow-up chemistries             --dysphagia 1; honey thick- Has Cortrak in place and receiving TF's.  8: T2DM: Hgb A1c- metformin on hold due to AKI/variable intake (puree diet). --Monitor BS ac/hs and use SSI for elevated BS. --Change TF to 6 pm-6 am CBG (last 3)  Recent Labs    01/04/22 1626 01/04/22 2121 01/05/22 0616  GLUCAP 115* 165* 141*   5/28- CBGs look better- con't  regimen 9: Hypertension: Off Lisinopril due to AKI/hyperkalemia  --continue amlodipine, atenolol, hydralazine Vitals:   01/04/22 1939 01/05/22 0431  BP:  (!) 159/72  Pulse:  84  Resp:  16  Temp:  97.7 F (36.5 C)  SpO2: 94% 95%  Elevated systolic , starting tizanidine for spasticity  which may lower BP will monitor  5/27- BP 160s systolic- might add meds tomorrow? 5/28- BP in 120s systolic- con't regimen 10:  COPD: Continue Anoro Ellipta daily.  Flutter valve qid. Smoked 2ppd - CXR OK  --Continue to taper prednisone. --Duoneb prn 5/27- SOB this AM- responded well to albuterol treatment given 5/28- added pulmicort nebs per Resp therapy- and also has albuterol scheduled 11: Alcohol abuse: cessation counseling. Off phenobarb. Check Thiamine level.  --continue B12, thiamine and folic acid 12: Tobacco abuse: cessation counseling --continue nicotine patch 13: Hyperlipidemia: LDL- 192 and Trig- 499-->continue Crestor 40 mg 14: AKI may bhave CKD : Baseline SCr-?~1.36 --Encourage --drinking honey liquids but not touching puree's.              --continue water flushes qid via cortak -Creat bumped up to 1.97- cont H2O flushes q 4 Na+ 134, hesitate increasing this , alb a little low but doubt significant 3rd spacing 15. Vitamin C deficiency: On daily supplement 16. Vitamin D deficiency: Continue weekly supplement.   17. Spasticity- developing spasticity already in RUE- might benefit from Botox if possible On tizanidine 2mg  TID - may be contributing to tiredness  18. RUE swelling/weeping  Brachial DVT + weakness from CVA with dependant edema , will remove wrist splint and do retrograde massage per OT    19.  Abd distension good bowel sound , dulc and SSE minimally effective, sorbitol plus SMOG enema 6 h later if needed. Other considerations would be dulcolax po to improve peristalsis    LOS: 5 days A FACE TO FACE EVALUATION WAS PERFORMED  01/05/2022, 8:14 AM

## 2022-01-05 NOTE — Progress Notes (Signed)
Occupational Therapy Session Note  Patient Details  Name: Ronnie Jones MRN: [id number] Date of Birth: 05-Oct-1950  Today's Date: 01/05/2022 OT Group Time: 1215-1230 (1200-1230) OT Group Time Calculation (min): 15 min   Short Term Goals: Week 1:  OT Short Term Goal 1 (Week 1): Pt will report for 2 consecutive sessions < 3/10 pain OT Short Term Goal 2 (Week 1): Pt will sit EOB at least 3 mins during ADL task to promote OOB tolerance OT Short Term Goal 3 (Week 1): Pt will complete 1 step of dressing task at bed level with no more than min verbal cues Week 2:     Skilled Therapeutic Interventions/Progress Updates:    1:1 [employer] with SLP with focus on encourage PO intake of modified diet for safe swallowing. Pt continues to present with tone in right Ue and swelling in distal hand/fingers. Provided education on positioning. Also performed gentle ROM to promote decr swelling. Pt with decr PO intake.   Pt taken back to room and and transferred with mod A to bed (towards the right) and got into supine with max A. Safety measures in place and wife present in room.   Therapy Documentation Precautions:  Precautions Precautions: Fall Precaution Comments: dense R hemiparesis Restrictions Weight Bearing Restrictions: No  Pain:  No indications of pain in session   Therapy/Group: Group Therapy  [name] [facility] 01/05/2022, 2:58 PM

## 2022-01-05 NOTE — Progress Notes (Signed)
Physical Therapy Session Note  Patient Details  Name: Ronnie Jones MRN: [id number] Date of Birth: [birth year]  Today's Date: 01/05/2022 PT Co-Treatment Time: 8366-2947 (PT time [phone]) PT Co-Treatment Time Calculation (min): 55 min  Short Term Goals: Week 1:  PT Short Term Goal 1 (Week 1): Pt will tolerate sitting in WC >2 hours between therapy PT Short Term Goal 2 (Week 1): Pt will transfer to Bed with max assist of 1 consistently PT Short Term Goal 3 (Week 1): Pt will ambulate 14ft with max assist + 2  Skilled Therapeutic Interventions/Progress Updates:    Pt received sitting EOB with OT present for co-tx. Pt with labored breathing throughout session; however, SpO2 93-94%. Therapist reinforced education on how to correctly utilize incentive spirometer and pt demonstrated use x3reps during session. L squat pivot transfer EOB>TIS w/c with mod assist of 1 for lifting/pivoting hips and cuing for increased anterior trunk lean and facilitation for R LE management for increased WBing to promote NMR.  Transported to/from gym in w/c for time management and energy conservation.   Donned R LE ankle DF assist ACE wrap. Pt reports almost absent R LE sensation. Gait training ~60ft x2 at L hallway rail with max assist of 1 for balance and  RLE management and +2 w/c follow - requires total assist to advance R LE during swing while avoiding excessive hip external rotation and adduction - requires max/total assist to block R knee buckle during stance - with facilitation for R weight shift during stance pt able to achieve positive step length on L LE for reciprocal pattern - able to maintain adequate trunk upright during gait without significant physical assistance.  Pt declining participation in additional gait training at this time due to fatigue. Squat pivot transfer w/c<>EOM with mod assist as described above.   Sitting to partial squat position from elevated EOM while reaching for clothespins with L UE  focusing on forced R LE and R UE WBing for NMR - requires heavy max assist of 1 for this task due to fatigue resulting in worsening R anterior trunk LOB requiring max assist to maintain upright - only able to attempt moving 2 clothespins due to fatigue.   Pt requires frequent supported seated rest breaks throughout session due to significantly impaired activity tolerance. At end of session, pt left seated tilted back in TIS w/c with needs in reach, seat belt alarm on, pillow supporting R UE, and lines intact.   Therapy Documentation Precautions:  Precautions Precautions: Fall Precaution Comments: dense R hemiparesis Restrictions Weight Bearing Restrictions: No   Pain: No specific complaints of pain during session but overall reports doing "terrible" stating "I'm old and I've had a stroke."    Therapy/Group: Co-Treatment  Auden Tatar M Shloime Keilman , PT, DPT, NCS, CSRS 01/05/2022, 7:54 AM

## 2022-01-05 NOTE — Progress Notes (Signed)
Occupational Therapy Session Note  Patient Details  Name: Ronnie Jones MRN: [id number] Date of Birth: 1951/01/21  Today's Date: 01/05/2022 OT Individual Time: 4276-7011 OT Individual Time Calculation (min): 19 min  and Today's Date: 01/05/2022 OT Co-Treatment Time: 0034-9611 OT Co-Treatment Time Calculation (min): 53 min   Short Term Goals: Week 1:  OT Short Term Goal 1 (Week 1): Pt will report for 2 consecutive sessions < 3/10 pain OT Short Term Goal 2 (Week 1): Pt will sit EOB at least 3 mins during ADL task to promote OOB tolerance OT Short Term Goal 3 (Week 1): Pt will complete 1 step of dressing task at bed level with no more than min verbal cues  Skilled Therapeutic Interventions/Progress Updates:    Pt received semi-reclined in bed with MD/RN present, no c/o pain but reports he feels "terrible" due to fatigue but, agreeable to therapy. Session focus on self-care retraining, activity tolerance, transfer retraining in prep for improved ADL/IADL/func mobility performance + decreased caregiver burden.  Came to sitting EOB with mod A to progress hemi body and to lift trunk , heavy use of bed features. Close S for static sitting balance EOB. Pt able to wash face and UB with min A to bathe LUE, total A to incorporate RUE functionally. Per MD to leave R wrist cock up splint off due to increase in edema in digits. Total A to change out hospital gown around heparin drip and to don pants. Pt able to assist with pulling up past knees, sit to stand with heavy mod A and use of LUE on back of chair, +2 present to stabilize chair.  PT then present for co tx. Squat-pivot transfer to TIS on his L with max A to sufficiently lift and pivot hips, +2 present to stabilize w/c.   Pt ambulated ~30 ft x2 in hallway with max A from PT to block RLE and use of LUE on hand rail, +2 required for close TIS follow. Pt required extended rest break in between bouts due to fatigue, SatO2 at 93-96% on RA throughout, HR  remained at mid-high 80s bpm.   Seated on mat, pt practiced reaching outside his BOS on his R to place/remove resistive clothes pins on horizontal bar in squat position. Attempted with 2 trials, but exteremly limited by fatigue and LOB to his R, assist to block RLE and maintain RUE in WB position on knee.  Pt left tilted back in TIS with PT present tp place safety belt, call bell in reach, and all immediate needs met.    Therapy Documentation Precautions:  Precautions Precautions: Fall Precaution Comments: dense R hemiparesis Restrictions Weight Bearing Restrictions: No  Pain: denies, but endorses fatigue ADL: See Care Tool for more details.  Therapy/Group: Individual Therapy and Co-Treatment  Volanda Napoleon MS, OTR/L  01/05/2022, 6:56 AM

## 2022-01-05 NOTE — Progress Notes (Signed)
Speech Language Pathology Daily Session Note  Patient Details  Name: Ronnie Jones MRN: [id number] Date of Birth: 1950-09-22  Today's Date: 01/05/2022 SLP Individual Time: 1450-1533 SLP Individual Time Calculation (min): 43 min  Short Term Goals: Week 1: SLP Short Term Goal 1 (Week 1): Patient will consume current diet without overt s/sx of aspiration with mod A verbal cues for use of swallowing compensatory strategies SLP Short Term Goal 2 (Week 1): Patient will be oriented x4 with mod A for use of external aids SLP Short Term Goal 3 (Week 1): Patient will implement speech intelligbility strategies at the word level with mod A verbal cues SLP Short Term Goal 4 (Week 1): Patient will complete basic problem solving with mod A verval/visual cues SLP Short Term Goal 5 (Week 1): Patient will exhibit recall of recent information with mod A verbal/visual cues for use of external aids SLP Short Term Goal 6 (Week 1): Patient will participate in further cognitive-linguistic evaluation SLP Short Term Goal 6 - Progress (Week 1): Met  Skilled Therapeutic Interventions: Skilled ST treatment focused on cognitive and speech goals. Pt received sleeping in bed on arrival and roused to min verbal stimuli. Pt initially reluctant to tx however agreeable once he learned he could stay in bed for today's session. SLP facilitated "solving daily math problems" subtest via the ALFA with 40% (4/10) accuracy without cues, progressing to 80% accuracy when given max A verbal cues. SLP facilitated speech intelligibility at the word level in addition to basic verbal reasoning and sustained attention through generating words in a category based on each letter of the alphabet. Pt required min-to-mod A verbal cues to implement loud and over articulate speech strategies, as well as mod A for verbal reasoning and attention to task. Pt demonstrating signs of fatigue toward end of session requiring increased processing time and max A  clinician cueing/encouragement. Patient was left in bed with alarm activated and immediate needs within reach at end of session. Continue per current plan of care.      Pain  None/denied  Therapy/Group: Individual Therapy  Patty Sermons 01/05/2022, 4:56 PM

## 2022-01-05 NOTE — Progress Notes (Signed)
Speech Language Pathology Daily Session Note  Patient Details  Name: Ronnie Jones MRN: [id number] Date of Birth: [birth year]  Today's Date: 01/05/2022 SLP Group Time: 1200-1215 SLP Group Time Calculation (min): 15 min  Short Term Goals: Week 1: SLP Short Term Goal 1 (Week 1): Patient will consume current diet without overt s/sx of aspiration with mod A verbal cues for use of swallowing compensatory strategies SLP Short Term Goal 2 (Week 1): Patient will be oriented x4 with mod A for use of external aids SLP Short Term Goal 3 (Week 1): Patient will implement speech intelligbility strategies at the word level with mod A verbal cues SLP Short Term Goal 4 (Week 1): Patient will complete basic problem solving with mod A verval/visual cues SLP Short Term Goal 5 (Week 1): Patient will exhibit recall of recent information with mod A verbal/visual cues for use of external aids SLP Short Term Goal 6 (Week 1): Patient will participate in further cognitive-linguistic evaluation  Skilled Therapeutic Interventions: Skilled treatment with OT in [employer] focused on dysphagia and self-feeding goals.  SLP facilitated session by providing Max A multimodal cues for encouragement with PO intake. Patient consumed several bites/sips of his lunch of Dys. 1 textures with honey-thick liquids without overt s/s of aspiration. Patient without right anterior spillage or oral residue throughout meal. Recommend patient continue current diet. Patient transferred back to bed at end of session with alarm on and all needs within reach. Continue with current plan of care.      Pain No/Denies Pain   Therapy/Group: Individual Therapy  Addasyn Mcbreen 01/05/2022, 3:24 PM

## 2022-01-06 ENCOUNTER — Inpatient Hospital Stay (HOSPITAL_COMMUNITY): Payer: Medicare HMO

## 2022-01-06 LAB — BASIC METABOLIC PANEL
Anion gap: 7 (ref 5–15)
BUN: 47 mg/dL — ABNORMAL HIGH (ref 8–23)
CO2: 24 mmol/L (ref 22–32)
Calcium: 8.6 mg/dL — ABNORMAL LOW (ref 8.9–10.3)
Chloride: 100 mmol/L (ref 98–111)
Creatinine, Ser: 1.62 mg/dL — ABNORMAL HIGH (ref 0.61–1.24)
GFR, Estimated: 46 mL/min — ABNORMAL LOW (ref >60)
Glucose, Bld: 161 mg/dL — ABNORMAL HIGH (ref 70–99)
Potassium: 3.9 mmol/L (ref 3.5–5.1)
Sodium: 131 mmol/L — ABNORMAL LOW (ref 135–145)

## 2022-01-06 LAB — PROTIME-INR
INR: 0.9 (ref 0.8–1.2)
Prothrombin Time: 11.6 seconds (ref 11.4–15.2)

## 2022-01-06 LAB — CBC
HCT: 33.7 % — ABNORMAL LOW (ref 39.0–52.0)
Hemoglobin: 11.7 g/dL — ABNORMAL LOW (ref 13.0–17.0)
MCH: 31 pg (ref 26.0–34.0)
MCHC: 34.7 g/dL (ref 30.0–36.0)
MCV: 89.2 fL (ref 80.0–100.0)
Platelets: 174 10*3/uL (ref 150–400)
RBC: 3.78 MIL/uL — ABNORMAL LOW (ref 4.22–5.81)
RDW: 13 % (ref 11.5–15.5)
WBC: 11.3 10*3/uL — ABNORMAL HIGH (ref 4.0–10.5)
nRBC: 0 % (ref 0.0–0.2)

## 2022-01-06 LAB — GLUCOSE, CAPILLARY
Glucose-Capillary: 141 mg/dL — ABNORMAL HIGH (ref 70–99)
Glucose-Capillary: 182 mg/dL — ABNORMAL HIGH (ref 70–99)
Glucose-Capillary: 135 mg/dL — ABNORMAL HIGH (ref 70–99)
Glucose-Capillary: 181 mg/dL — ABNORMAL HIGH (ref 70–99)

## 2022-01-06 LAB — HEPARIN LEVEL (UNFRACTIONATED): Heparin Unfractionated: 0.39 IU/mL (ref 0.30–0.70)

## 2022-01-06 LAB — VITAMIN B1: Vitamin B1 (Thiamine): 249.4 nmol/L — ABNORMAL HIGH (ref 66.5–200.0)

## 2022-01-06 IMAGING — DX DG ABDOMEN 1V
1 series · 1 of 1 positions shown · non-contrast
Comparison: Radiographs [DATE] and [DATE].

CLINICAL DATA: Abdominal distension.

EXAM:
ABDOMEN - 1 VIEW

[abdomen]
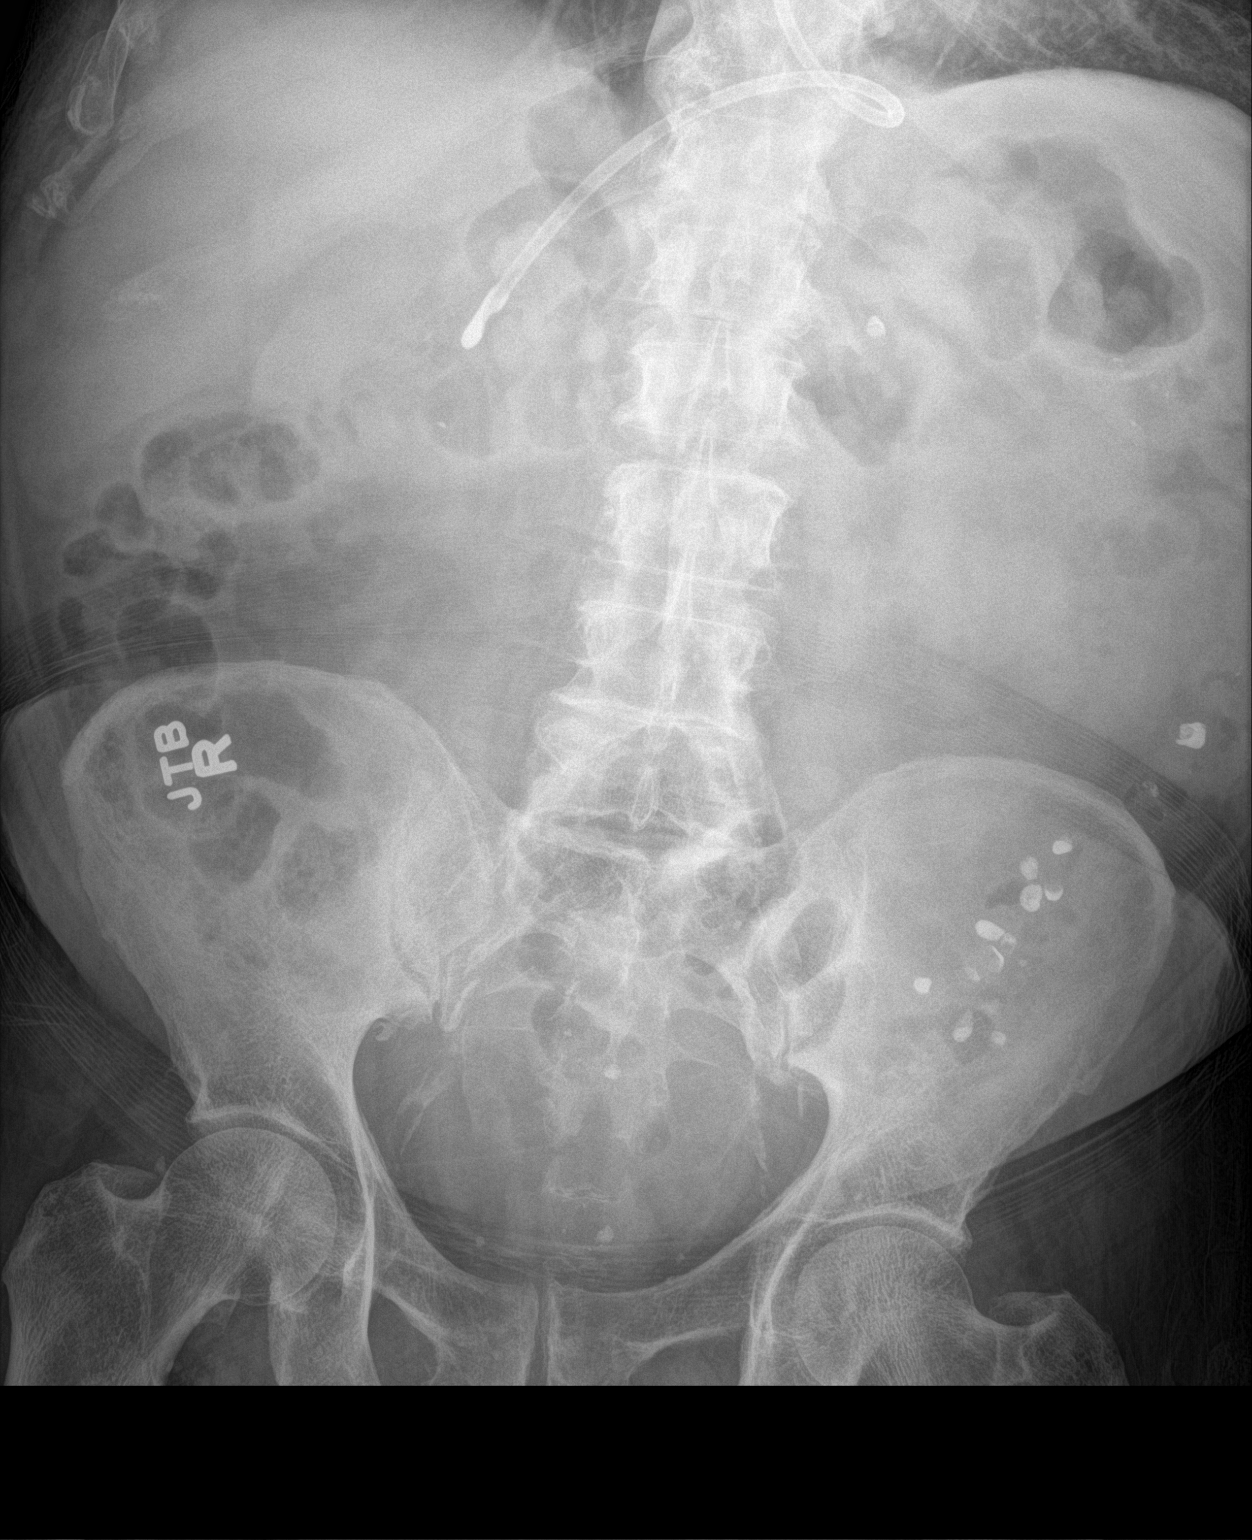

[1 of 1 positions shown; findings below may reference images not displayed]

FINDINGS: [I6] hours. Feeding tube appears unchanged, in the right upper
quadrant of the abdomen. The visualized bowel gas pattern is normal.
There is retained barium within left-sided colonic diverticula. No
supine evidence of free intraperitoneal air. Vascular calcifications
and pelvic phleboliths are noted. There are mild degenerative
changes in the spine.
IMPRESSION: No evidence of acute abdominal process. Unchanged position of
feeding tube.

## 2022-01-06 MED ORDER — OSMOLITE 1.5 CAL PO LIQD
1000.0000 mL | ORAL | Status: DC
  Administered 2022-01-06 – 2022-01-09 (×4): 1000 mL
  Filled 2022-01-06 (×10): qty 1000

## 2022-01-06 MED ORDER — POLYETHYLENE GLYCOL 3350 17 G PO PACK
17.0000 g | PACK | Freq: Every day | ORAL | Status: DC
  Administered 2022-01-07 – 2022-01-19 (×11): 17 g
  Filled 2022-01-06 (×13): qty 1

## 2022-01-06 NOTE — Progress Notes (Signed)
Speech Language Pathology Weekly Progress and Session Note  Patient Details  Name: Ronnie Jones MRN: [id number] Date of Birth: 03-07-1951  Beginning of progress report period: Jan 01, 2022 End of progress report period: January 07, 2022  Short Term Goals: Week 1: SLP Short Term Goal 1 (Week 1): Patient will consume current diet without overt s/sx of aspiration with mod A verbal cues for use of swallowing compensatory strategies SLP Short Term Goal 1 - Progress (Week 1): Met SLP Short Term Goal 2 (Week 1): Patient will be oriented x4 with mod A for use of external aids SLP Short Term Goal 2 - Progress (Week 1): Met SLP Short Term Goal 3 (Week 1): Patient will implement speech intelligbility strategies at the word level with mod A verbal cues SLP Short Term Goal 4 (Week 1): Patient will complete basic problem solving with mod A verval/visual cues SLP Short Term Goal 4 - Progress (Week 1): Not met SLP Short Term Goal 5 (Week 1): Patient will exhibit recall of recent information with mod A verbal/visual cues for use of external aids SLP Short Term Goal 5 - Progress (Week 1): Not met SLP Short Term Goal 6 (Week 1): Patient will participate in further cognitive-linguistic evaluation SLP Short Term Goal 6 - Progress (Week 1): Met  New Short Term Goals: Week 2: SLP Short Term Goal 1 (Week 2): Patient will consume current diet without overt s/sx of aspiration with min-to-mod A verbal cues for use of swallowing compensatory strategies SLP Short Term Goal 2 (Week 2): Patient will be oriented x4 with min-to-mod A for use of external aids SLP Short Term Goal 3 (Week 2): Patient will complete basic problem solving with mod A verval/visual cues SLP Short Term Goal 4 (Week 2): Patient will implement speech intelligbility strategies at the word level with min-tomod A verbal cues SLP Short Term Goal 5 (Week 2): Patient will exhibit recall of recent information with mod A verbal/visual cues for use of external  aids  Weekly Progress Updates: Patient has made slow gains and has met 3 of 5 STGs this reporting period. Progress appears impacted by fatigue and motivation/interest. Patient is currently completing cognitive tasks with overall mod-to-max A verbal/visual cues in regards to orientation, basic problem solving, functional recall, and awareness. Pt demonstrates mild improvement with speech intelligibility and currently requiring mod A to implement strategies at the word level. Pt is consuming a dysphagia 2 diet with honey thick liquids and min-to-mod A for implementation of safe swallowing precautions/strategies under full supervision with PO intake. Oral intake has been poor due to poor appetite. Cortrak still in place for supplemental nutrition. Patient and family education is ongoing. Patient would benefit from continued skilled SLP intervention to maximize cognitive, speech, and swallow functioning and overall functional independence prior to discharge.     Intensity: Minumum of 1-2 x/day, 30 to 90 minutes Frequency: 3 to 5 out of 7 days Duration/Length of Stay: 3.5-4 weeks Treatment/Interventions: Cognitive remediation/compensation;Environmental controls;Internal/external aids;Cueing hierarchy;Dysphagia/aspiration precaution training;Functional tasks;Patient/family education;Therapeutic Activities   Patty Sermons 01/06/2022, 5:44 PM

## 2022-01-06 NOTE — Progress Notes (Signed)
Physical Therapy Session Note  Patient Details  Name: Ronnie Jones MRN: [id number] Date of Birth: 1951-07-24  Today's Date: 01/06/2022 PT Individual Time: 1120-1200   40 min   Short Term Goals: Week 1:  PT Short Term Goal 1 (Week 1): Pt will tolerate sitting in WC >2 hours between therapy PT Short Term Goal 2 (Week 1): Pt will transfer to Bed with max assist of 1 consistently PT Short Term Goal 3 (Week 1): Pt will ambulate 56f with max assist + 2 Week 2:    Week 3:     Skilled Therapeutic Interventions/Progress Updates:   RN present with pt for in/out cath. Pt received supine in bed and agreeable to PT. Supine>sit transfer with max with log roll to the L. [facility] overall throughout session x 2. Lateral scoot to and from mat table with mod assist from PT with RLE blocked. Lateral and cross body reaches x 8 bil with LUE with cues for AP control and improved weight shifting to the L. Sit<>stand  in parallel bars with 30sec hold in standing with RLE blocked performed x 3. Mild dizziness on 2nd and 3rd bout reported by pt. Pt's IV noted to have been partly dislodged. RN notified. .   Pt returned to room and performed *stedy transfer to bed with mod assist as listed above. Sit>supine completed with max assist, and left supine in bed with call bell in reach and all needs met.       Therapy Documentation Precautions:  Precautions Precautions: Fall Precaution Comments: dense R hemiparesis Restrictions Weight Bearing Restrictions: No  Missed time. 20 min RN care.  Vital Signs: Therapy Vitals Temp: 98.9 F (37.2 C) Pulse Rate: 88 Resp: 18 BP: 124/67 Oxygen Therapy SpO2: 94 % O2 Device: Room Air Pain: General. 8/10. Pt repositioned.     Therapy/Group: Individual Therapy  ALorie Phenix5/31/2023, 8:05 AM

## 2022-01-06 NOTE — Progress Notes (Signed)
ANTICOAGULATION CONSULT NOTE - Follow Up Consult  Pharmacy Consult for Heparin Indication: DVT  Allergies  Allergen Reactions   Codeine Nausea Only    Patient Measurements: Height: 5\' 7"  (170.2 cm) Weight: 77.1 kg (169 lb 15.6 oz) IBW/kg (Calculated) : 66.1 kg Heparin Dosing Weight: 77 kg  Vital Signs: Temp: 98.9 F (37.2 C) (05/31 0417) BP: 124/67 (05/31 0417) Pulse Rate: 88 (05/31 0417)  Labs: Recent Labs    01/04/22 0554 01/05/22 01/07/22 01/05/22 0715 01/06/22 0508 01/06/22 0817  HGB 13.1 12.4*  --  11.7*  --   HCT 39.6 36.1*  --  33.7*  --   PLT 169 172  --  174  --   LABPROT  --   --   --   --  11.6  INR  --   --   --   --  0.9  HEPARINUNFRC 0.32 QUESTIONABLE RESULTS, RECOMMEND RECOLLECT TO VERIFY 0.23*  --  0.39  CREATININE 1.97*  --   --  1.62*  --      Estimated Creatinine Clearance: 40.8 mL/min (A) (by C-G formula based on SCr of 1.62 mg/dL (H)).     Assessment: 71 yo male presents with left subcortical IPH and is now found to have an 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 PTA the patient is not on anticoagulation. Pharmacy is consulted to dose heparin.  Heparin level now therapeutic  Goal of Therapy:  Heparin level 0.3-0.5 units/ml - no bolus Monitor platelets by anticoagulation protocol: Yes   Plan:  Continue heparin at 1200 units / hr Monitor for signs and symptoms of bleeding Heparin level and CBC daily   Thank you 73, PharmD 01/06/2022 9:47 AM

## 2022-01-06 NOTE — Patient Care Conference (Signed)
Inpatient RehabilitationTeam Conference and Plan of Care Update Date: 01/06/2022   Time: 10:17 AM    Patient Name: Ronnie Jones      Medical Record Number: [id number]  Date of Birth: 10/26/1950 Sex: Male         Room/Bed: 4M08C/4M08C-01 Payor Info: Payor: [employer] MEDICARE / Plan: [employer] MEDICARE HMO/PPO / Product Type: *No Product type* /    Admit Date/Time:  12/31/2021  5:31 PM  Primary Diagnosis:  Intraparenchymal hemorrhage of brain [facility])  Hospital Problems: Principal Problem:   Intraparenchymal hemorrhage of brain [facility]) Active Problems:   Aspiration pneumonia ([place])   COPD with acute exacerbation ([place])   Dysphagia   ICH (intracerebral hemorrhage) [facility])    Expected Discharge Date: Expected Discharge Date: 01/27/22  Team Members Present: Physician leading conference: Dr. Alysia Penna Social Worker Present: Erlene Quan, BSW Nurse Present: Dorien Chihuahua, RN PT Present: Barrie Folk, PT OT Present: Providence Lanius, OT SLP Present: Sherren Kerns, SLP PPS Coordinator present : Gunnar Fusi, SLP     Current Status/Progress Goal Weekly Team Focus  Bowel/Bladder   Incontinent of B/B requiring I/O cath, [facility] 5/31  Regain ability to void  Continue I/O cath as needed.   Swallow/Nutrition/ Hydration   dys 1 diet, honey thick liquids; max A + encouragement for PO consumtion  sup A  tolerance of current diet with implementation of safe swallowing precautions and strategies, dys 2 trials   ADL's   +2 for toileting in stedy, stedy transfer of 1, total A of 1 LBD/UBD, S seated grooming, fatigues really easily, no noted activation yet in RUE  currently min A overall  RUE NMR, transfer/balance/self-care retraining, pt/family/AE/DME education   Mobility   Fatigued, wife assists with keeping pt focused on therapy and not declining sessions. Mod/max assist bed mobility. Mod-max assist all sit<>stands with pull-to-stand technique (STEDY and hallway HR), MaxA for ambulation using  hallway HR. MaxA for RLE advancement and upright posturing in stance. TIS WC.  Supervision for sitting balance, MinA for bed mobility; Min to [place] for transfers; ModA for gait; supervision for w/c mobility  endurance. Transfers. Balance ,gait. R sided NMR.   Communication   mod A speech intelligiblity  sup a  speech strategies at the word level   Safety/Cognition/ Behavioral Observations  max A  sup A  orientation, prob solving, awareness   Pain   no c/o pain  Pain <3/10  Asses Qshift and prn   Skin   Small skin tear on upper back. Ecchymosis on bilateral abdomen. Rt arm swollen and weeping fluid.  Maintain remainder of skin integrity.  Continue Changing dressing to right arm as needed.     Discharge Planning:  Discharging home with spouse able to provide MOD A 24/7 supervision avaliable   Team Discussion: Patient with brachial DVT, spasticity and edema right UE. Constipation addressed; F/U KUB ordered per MD. Remains NPO with cortrak, on D1 Honey diet and needs max cues/encouragement for po intake. Discussion of PEG placement if patient does not pick up po intake to meet nutritional needs.  Patient on target to meet rehab goals: no, currently has limited participation , poor attention, poor sensation with decreased awareness and dis-orientation.  Needs total assist for ADLs and mod assist to stand. Requires total assist for toileting. Complete sit- stand with max assist and able to ambulate at the rail up to 20' with max assist. Continue to work on speech intelligibility and problem solving. Goals for discharge set for mod assist; ambulation with therapy  only; w/c level for discharge  *See Care Plan and progress notes for long and short-term goals.   Revisions to Treatment Plan:  Downgraded SLP goals   Teaching Needs: Safety, medication, secondary risk management, nutritional means, transfers, toileting, etc.  Current Barriers to Discharge: Decreased caregiver support, Neurogenic bowel  and bladder, and Nutritional means  Possible Resolutions to Barriers: Family education Ramp for entry to home DME: W/C     Medical Summary Current Status: poor oral intake requiring TF, poor trunkal stability , urinary retention, COPD on prednisone taper  Barriers to Discharge: Medical stability;Medication compliance;Other (comments)  Barriers to Discharge Comments: poor awareness of RIght side Possible Resolutions to Barriers/Weekly Focus: consider PEG if pt unable to maintain hydration , nutrition , po route, trial alpha blocker for urinary retention   Continued Need for Acute Rehabilitation Level of Care: The patient requires daily medical management by a physician with specialized training in physical medicine and rehabilitation for the following reasons: Direction of a multidisciplinary physical rehabilitation program to maximize functional independence : Yes Medical management of patient stability for increased activity during participation in an intensive rehabilitation regime.: Yes Analysis of laboratory values and/or radiology reports with any subsequent need for medication adjustment and/or medical intervention. : Yes   I attest that I was present, lead the team conference, and concur with the assessment and plan of the team.   Dorien Chihuahua B 01/06/2022, 3:22 PM

## 2022-01-06 NOTE — Progress Notes (Signed)
Occupational Therapy Session Note  Patient Details  Name: Ronnie Jones MRN: [id number] Date of Birth: 09/15/1950  Today's Date: 01/06/2022 OT Individual Time: 0902-0957 OT Individual Time Calculation (min): 55 min  + 45 min   Short Term Goals: Week 1:  OT Short Term Goal 1 (Week 1): Pt will report for 2 consecutive sessions < 3/10 pain OT Short Term Goal 2 (Week 1): Pt will sit EOB at least 3 mins during ADL task to promote OOB tolerance OT Short Term Goal 3 (Week 1): Pt will complete 1 step of dressing task at bed level with no more than min verbal cues  Skilled Therapeutic Interventions/Progress Updates:    Session 1 (0902-0957): Pt received semi-reclined in bed, easily awakened to voice. Session focus on self-care retraining, activity tolerance, transfer retraining, RUE NMR in prep for improved ADL/IADL/func mobility performance + decreased caregiver burden. No c/o pain but reports feeling "terrible." Came to sitting EOB on his R with max A to progress BLE off bed and to lift trunk, close S for static sitting balance, but requires up to mod A for balance when threading LLE into pants. Ultimately required total A to don pants, mod A to come into standing with LUE support on chair,+2 present to stabilize chair. Guided through use of IS due to labored breathing. SatO2 at 96-97% on RA throughout session.   Squat-pivot via multiple scoots x2 throughout session <> TIS with max A , intermittent assist of 2 to stabilize TIS and sufficiently boost hips. Pt washed face and combed hair with S at sink with LUE.   Sit to stand x2 at high low table with max A of 2 to support hemibody and promote sufficient trunk flexion. Completed two additional sit to stands with LUE supported on chair, improved performance using this method, but required increased assist compared to first sit to stand this am.  Seated completed 3x10 forward towel slides and B forearm pulses to promote RUE WB. PROM to RUE  joints.  Pt left semi-reclined in bed with wife providing S (cleared on SLP safety sheet) to eat breakfast, with bed alarm engaged, call bell in reach, and all immediate needs met.    Session 2(1345-1430): Pt received semi-reclined in bed finishing with SLP, no c/o pain and, agreeable to therapy. Session focus on RUE/RLE NMR in prep for improved ADL/IADL/func mobility performance + decreased caregiver burden.  Transitioned to side lying on his L. Completed 2x10 of the following with use of towel and bed side table : elbow flexion/extension, R shoulder flexion. Noted controlled activation in in biceps and shoulder musculature this date, but continues to be dependent in extending elbow.   Transitioned to supine. Pt completed 2x10 bridges with only assist to hold B feet in place and prevent excessive R hip abduction.   Finally, completed 2x10 scapular elevation and retraction with more noted activation to complete retraction. SatO2 at 94-98% on RA. Guided through 10 breathes with use of IS.  Pt left with HOB over 30 degrees with bed alarm engaged, call bell in reach, and all immediate needs met.     Therapy Documentation Precautions:  Precautions Precautions: Fall Precaution Comments: dense R hemiparesis Restrictions Weight Bearing Restrictions: No  Pain: no c/o but endorses fatigue   ADL: See Care Tool for more details.   Therapy/Group: Individual Therapy   A  MS, OTR/L  01/06/2022, 6:53 AM 

## 2022-01-06 NOTE — Progress Notes (Signed)
Speech Language Pathology Daily Session Note  Patient Details  Name: Ronnie Jones MRN: [id number] Date of Birth: [birth year]  Today's Date: 01/06/2022 SLP Individual Time: 1300-1345 SLP Individual Time Calculation (min): 45 min  Short Term Goals: Week 1: SLP Short Term Goal 1 (Week 1): Patient will consume current diet without overt s/sx of aspiration with mod A verbal cues for use of swallowing compensatory strategies SLP Short Term Goal 2 (Week 1): Patient will be oriented x4 with mod A for use of external aids SLP Short Term Goal 3 (Week 1): Patient will implement speech intelligbility strategies at the word level with mod A verbal cues SLP Short Term Goal 4 (Week 1): Patient will complete basic problem solving with mod A verval/visual cues SLP Short Term Goal 5 (Week 1): Patient will exhibit recall of recent information with mod A verbal/visual cues for use of external aids SLP Short Term Goal 6 (Week 1): Patient will participate in further cognitive-linguistic evaluation SLP Short Term Goal 6 - Progress (Week 1): Met  Skilled Therapeutic Interventions: Skilled ST treatment focused on cognitive and swallowing goals. SLP facilitated dysphagia 2 PO trials. Pt exhibited effective and timely mastication, no significant oral residue post swallow, and without overt s/sx of aspiration. Pt consumed with min A verbal cues for safe swallowing precautions including small bites. SLP recommends advancement to dys 2 diet with honey thick liquids. Intake continues to be poor 2/2 poor appetite. SLP recommended for patient/spouse to call in meals to ensure pt receives preferred meals. SLP located food & nutrition services during session to take pt's individualized order for upcoming meals. SLP facilitated sustained attention and working memory task with overall max A verbal cues and field of choices to recall 70% of novel information. Pt recalled only 20% of information without SLP support. Pt with overall  flat affect and appearing disinterested in therapeutic tasks, with most initial responses "I don't know", however receptive to additional prompting with minimal signs of frustration. Patient was left in bed with alarm activated and immediate needs within reach at end of session. Continue per current plan of care.      Pain  None/denied  Therapy/Group: Individual Therapy  Patty Sermons 01/06/2022, 3:45 PM

## 2022-01-06 NOTE — Plan of Care (Signed)
Constipation; required SMOG enema p fleets, supp, sorbitol and SSE

## 2022-01-06 NOTE — Progress Notes (Signed)
Nutrition Follow-up  DOCUMENTATION CODES:   Not applicable  INTERVENTION:  - Continue nocturnal tube feeding via Cortrak: Transition from Nepro to Osmolite 1.5 at 45ml/hr x14 hours Pro-Source TF 45 ml BID Provides 1760 kcal, 92 gm protein, 762 ml free water daily   Given patient on Honey Thick Liquids and taking in very little, recommend meeting hydration needs via feeding tube at this time. Free water flush of 200 mL q 4 hours: total free water from TF and scheduled flushes is 2051 mL water  - Continue Magic cup TID with meals, each supplement provides 290 kcal and 9 grams of protein  NUTRITION DIAGNOSIS:   Inadequate oral intake related to poor appetite, chronic illness, acute illness as evidenced by meal completion < 25%, per patient/family report.  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 PMH includes COPD, HTN, EtOH abuse (1 pint of tequila daily), CKD, DM, vitamin deficiencies including Vit D and Vit C.  Pt sitting in bed with lunch tray in table. Noted bites taken of each item. Pt's wife at beside states this is the most she's seen him eat. Pt reports ongoing poor appetite and no desire to eat.   Noted diet just advanced to dysphagia 2/ honey thick today. Hopefully this will help with providing wider variety of menu options and daily intake.   Meal completions on average is 25% with a few meals of 10%. Pt not meeting needs via PO intake alone and would benefit from continued enteral nutrition support. Currently receiving Nepro tube feeding. Will transition pt to a standard formula as renal  electrolytes are WNL.   Pt noted to have previously had constipation with associated constipation which has resolved, noted to have had 5 BM's yesterday s/p SMOG enema and bowel medications.   Pt wt appears stable at 170 lbs.   Medications: vitamin C 500mg  BID, peridex, folvite, SSI 0-5 units qhs, 0-9 units TID, melatonin, protonix, miralax, senna, thiamine, Vitamin D units every 7 days  Labs: sodium 131, BUN 47, Cr 1.62, CBG's 107-182 x24 hours  UOP: 2100 ml x24 hours + 400 ml x12 hours I/O's: [id number] since admission   Diet Order:   Diet Order             DIET DYS 2 Room service appropriate? Yes; Fluid consistency: Honey Thick  Diet effective now                   EDUCATION NEEDS:   Not appropriate for education at this time  Skin:  Skin Assessment: Reviewed RN Assessment Skin Integrity Issues:: Other (Comment) Other: skin tear: vertebral column  Last BM:  5/31 (type 7)  Height:   Ht Readings from Last 1 Encounters:  12/31/21 5\' 7"  (1.702 m)    Weight:   Wt Readings from Last 1 Encounters:  01/06/22 77.1 kg   BMI:  Body mass index is 26.62 kg/m.  Estimated Nutritional Needs:   Kcal:  1900-2100 kcals  Protein:  100-110 g  Fluid:  >/1.9 L  , RDN, LDN Clinical Nutrition

## 2022-01-06 NOTE — Progress Notes (Addendum)
PROGRESS NOTE   Subjective/Complaints:  Liquid BM x 5 yesterday , appears drowsy this am but responds to questions, discussed H20 flushes with RN, states these occur automatically with pump   ROS: Limited by sedation/aphasia this AM  Objective:   DG Abd 1 View  Result Date: 01/04/2022 CLINICAL DATA:  Abdominal distension EXAM: ABDOMEN - 1 VIEW COMPARISON:  Jan 01, 2022 FINDINGS: A feeding tube terminates in the right side of the abdomen, probably in the proximal duodenum. Air-filled loops of large and small bowel are normal in caliber. No free air, portal venous gas, or pneumatosis. IMPRESSION: Air-filled loops of large and small bowel are normal in caliber. No evidence of obstruction or ileus. The distal tip of the feeding tube is in the second portion of the duodenum. Electronically Signed   By: Dorise Bullion III M.D.   On: 01/04/2022 11:47    Recent Labs    01/05/22 0608 01/06/22 0508  WBC 12.5* 11.3*  HGB 12.4* 11.7*  HCT 36.1* 33.7*  PLT 172 174    Recent Labs    01/04/22 0554  NA 134*  K 4.6  CL 103  CO2 21*  GLUCOSE 152*  BUN 61*  CREATININE 1.97*  CALCIUM 8.6*     Intake/Output Summary (Last 24 hours) at 01/06/2022 0837 Last data filed at 01/06/2022 0530 Gross per 24 hour  Intake 256 ml  Output 2100 ml  Net -1844 ml         Physical Exam: Vital Signs Blood pressure 124/67, pulse 88, temperature 98.9 F (37.2 C), resp. rate 18, height 5' 7"  (1.702 m), weight 77.1 kg, SpO2 94 %.   General: No acute distress Mood and affect are appropriate Heart: Regular rate and rhythm no rubs murmurs or extra sounds Lungs: Clear to auscultation, breathing unlabored, no rales or wheezes Abdomen: Positive bowel sounds, soft nontender to palpation,+distended Extremities: No clubbing, cyanosis, or edema Skin: No evidence of breakdown, no evidence of rash Neurologic: Cranial nerves II through XII intact, motor  strength is 5/5 in Left 3- RIght deltoid, bicep, tricep, trace grip, hip flexor, knee extensors, ankle dorsiflexor and plantar flexor   Skin: RUE- wrapped in kerlex Extremities: 1+ RLE edema; 2+ pitting edema RUE distal to wrist splint  Assessment/Plan: 1. Functional deficits which require 3+ hours per day of interdisciplinary therapy in a comprehensive inpatient rehab setting. Physiatrist is providing close team supervision and 24 hour management of active medical problems listed below. Physiatrist and rehab team continue to assess barriers to discharge/monitor patient progress toward functional and medical goals  Care Tool:  Bathing  Bathing activity did not occur: Refused Body parts bathed by patient: Face         Bathing assist Assist Level: Supervision/Verbal cueing     Upper Body Dressing/Undressing Upper body dressing   What is the patient wearing?: Hospital gown only    Upper body assist Assist Level: Total Assistance - Patient < 25%    Lower Body Dressing/Undressing Lower body dressing      What is the patient wearing?: Pants     Lower body assist Assist for lower body dressing: Total Assistance - Patient < 25%  Toileting Toileting    Toileting assist Assist for toileting: Total Assistance - Patient < 25%     Transfers Chair/bed transfer  Transfers assist     Chair/bed transfer assist level: Dependent - mechanical lift     Locomotion Ambulation   Ambulation assist   Ambulation activity did not occur: Safety/medical concerns          Walk 10 feet activity   Assist  Walk 10 feet activity did not occur: Safety/medical concerns        Walk 50 feet activity   Assist Walk 50 feet with 2 turns activity did not occur: Safety/medical concerns         Walk 150 feet activity   Assist Walk 150 feet activity did not occur: Safety/medical concerns         Walk 10 feet on uneven surface  activity   Assist Walk 10 feet on  uneven surfaces activity did not occur: Safety/medical concerns         Wheelchair     Assist Is the patient using a wheelchair?: Yes Type of Wheelchair: Manual    Wheelchair assist level: Dependent - Patient 0% Max wheelchair distance: 150    Wheelchair 50 feet with 2 turns activity    Assist        Assist Level: Dependent - Patient 0%   Wheelchair 150 feet activity     Assist      Assist Level: Dependent - Patient 0%   Blood pressure 124/67, pulse 88, temperature 98.9 F (37.2 C), resp. rate 18, height 5' 7"  (1.702 m), weight 77.1 kg, SpO2 94 %.  Medical Problem List and Plan: 1. 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 Formulized individual treatment plan based on medical history, underlying problem and comorbidities.   2.  Antithrombotics: -DVT/anticoagulation:  Pharmaceutical: Heparin  5/27- R brachial DVT- placed on Heparin gtt with no bolus per Dr Leonie Man- pharmacy managing.   5/28- Hb stable-no signs of additonal stroke/bleeding so far.              -antiplatelet therapy: none 3. Pain Management: Tylenol as needed 4. Mood: LCSW to evaluate and provide emotional support             -antipsychotic agents: n/a 5. Neuropsych: This patient is capable of making decisions on his own behalf. 6. Skin/Wound Care: Routine skin care checks 7. Fluids/Electrolytes/Nutrition: Routine Is and Os and follow-up chemistries             --dysphagia 1; honey thick- Has Cortrak in place and receiving TF's.  8: T2DM: Hgb A1c- metformin on hold due to AKI/variable intake (puree diet). --Monitor  BS ac/hs and use SSI for elevated BS. --Change TF to 6 pm-6 am CBG (last 3)  Recent Labs    01/05/22 1618 01/05/22 2102 01/06/22 0542  GLUCAP 158* 107* 141*   Controlled 5/31 9: Hypertension: Off Lisinopril due to AKI/hyperkalemia  --continue amlodipine, atenolol, hydralazine Vitals:   01/06/22 0152 01/06/22 0417  BP:  124/67  Pulse:  88  Resp:  18  Temp:  98.9 F (37.2 C)  SpO2: 97% 94%  Controlled 5/31  10:  COPD: Continue Anoro Ellipta daily.  Flutter valve qid. Smoked 2ppd - CXR OK  --Continue to taper prednisone. --Duoneb prn 5/27- SOB this AM- responded well to albuterol treatment given 5/28- added pulmicort nebs per Resp therapy- and also has albuterol scheduled 11: Alcohol abuse: cessation counseling. Off phenobarb. Check Thiamine level.  --continue B12, thiamine and folic acid 12: Tobacco abuse: cessation counseling --continue nicotine patch 13: Hyperlipidemia: LDL- 192 and Trig- 499-->continue Crestor 40 mg 14: AKI may bhave CKD : Baseline SCr-?~1.36 --Encourage --drinking honey liquids but not touching puree's.              --continue water flushes qid via cortak -Creat bumped up to 1.97- cont H2O flushes 237m q 4 Na+ 134, hesitate increasing this , alb a little low but doubt significant 3rd spacing Recheck in am  15. Vitamin C deficiency: On daily supplement 16. Vitamin D deficiency: Continue weekly supplement.   17. Spasticity- developing spasticity already in RUE- might benefit from Botox if possible On tizanidine 251mTID - may be contributing to tiredness - this was d/ced but pt remains somnolent most likely CVA related  18. RUE swelling/weeping  Brachial DVT + weakness from CVA with dependant edema , will remove wrist splint and do retrograde massage per OT    RLE also with edema but doppler neg, EF if normal , no LLE swelling , likely related to hemiplegia  19.  Abd distension good bowel sound , dulc and SSE minimally effective, sorbitol plus SMOG  enema was helpful , recheck KUB as pt still distended, may be gas  20.  Urinary retention requiring I/O cath , add flomax LOS: 6 days A FACE TO FACE EVALUATION WAS PERFORMED  AnCharlett Blake/31/2023, 8:37 AM

## 2022-01-07 ENCOUNTER — Inpatient Hospital Stay (HOSPITAL_COMMUNITY): Payer: Medicare HMO

## 2022-01-07 DIAGNOSIS — R140 Abdominal distension (gaseous): Secondary | ICD-10-CM

## 2022-01-07 DIAGNOSIS — N179 Acute kidney failure, unspecified: Secondary | ICD-10-CM

## 2022-01-07 DIAGNOSIS — E1169 Type 2 diabetes mellitus with other specified complication: Secondary | ICD-10-CM

## 2022-01-07 DIAGNOSIS — I10 Essential (primary) hypertension: Secondary | ICD-10-CM

## 2022-01-07 LAB — HEPARIN LEVEL (UNFRACTIONATED): Heparin Unfractionated: 0.29 IU/mL — ABNORMAL LOW (ref 0.30–0.70)

## 2022-01-07 LAB — GLUCOSE, CAPILLARY
Glucose-Capillary: 188 mg/dL — ABNORMAL HIGH (ref 70–99)
Glucose-Capillary: 152 mg/dL — ABNORMAL HIGH (ref 70–99)
Glucose-Capillary: 138 mg/dL — ABNORMAL HIGH (ref 70–99)
Glucose-Capillary: 176 mg/dL — ABNORMAL HIGH (ref 70–99)

## 2022-01-07 LAB — CBC
HCT: 33 % — ABNORMAL LOW (ref 39.0–52.0)
Hemoglobin: 11.4 g/dL — ABNORMAL LOW (ref 13.0–17.0)
MCH: 30.4 pg (ref 26.0–34.0)
MCHC: 34.5 g/dL (ref 30.0–36.0)
MCV: 88 fL (ref 80.0–100.0)
Platelets: 168 10*3/uL (ref 150–400)
RBC: 3.75 MIL/uL — ABNORMAL LOW (ref 4.22–5.81)
RDW: 12.9 % (ref 11.5–15.5)
WBC: 8.6 10*3/uL (ref 4.0–10.5)
nRBC: 0 % (ref 0.0–0.2)

## 2022-01-07 IMAGING — DX DG CHEST 1V PORT
1 series · 1 of 1 positions shown · non-contrast
Comparison: [DATE]

CLINICAL DATA: Acute left upper quadrant pain

EXAM:
PORTABLE CHEST 1 VIEW

[chest]
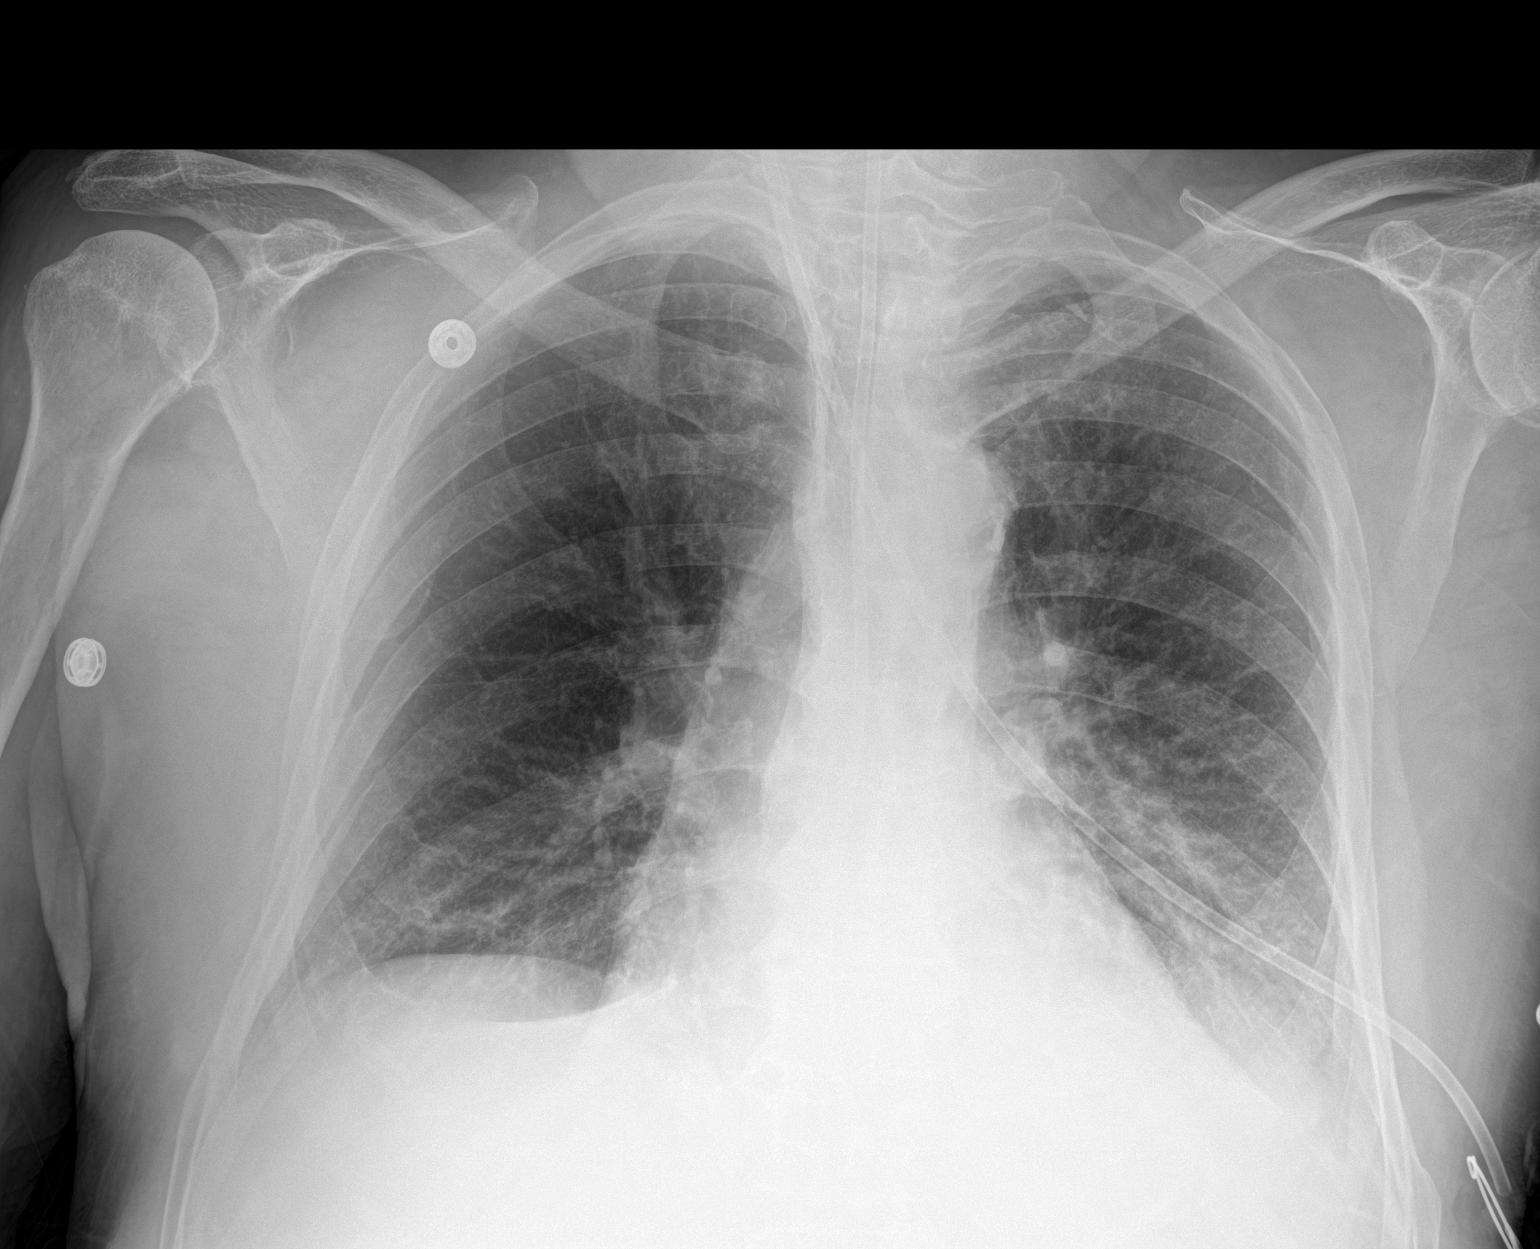

[1 of 1 positions shown; findings below may reference images not displayed]

FINDINGS: Single frontal view of the chest demonstrates enteric catheter
passing below diaphragm tip excluded by collimation. Interval
development of left basilar consolidation and small left pleural
effusion. Trace right pleural effusion also suspected. Increased
central vascular congestion. No pneumothorax. No acute bony
abnormalities.
IMPRESSION: 1. Increased central vascular congestion with developing left
basilar consolidation and bilateral effusions, most consistent with
volume overload. Superimposed infection cannot be excluded.

## 2022-01-07 MED ORDER — IPRATROPIUM-ALBUTEROL 0.5-2.5 (3) MG/3ML IN SOLN
3.0000 mL | Freq: Two times a day (BID) | RESPIRATORY_TRACT | Status: DC
  Administered 2022-01-07 – 2022-01-13 (×12): 3 mL via RESPIRATORY_TRACT
  Filled 2022-01-07 (×12): qty 3

## 2022-01-07 MED ORDER — FUROSEMIDE 40 MG PO TABS
40.0000 mg | ORAL_TABLET | Freq: Every day | ORAL | Status: AC
  Administered 2022-01-07: 40 mg via ORAL
  Filled 2022-01-07 (×2): qty 1

## 2022-01-07 NOTE — Progress Notes (Signed)
Occupational Therapy Session Note  Patient Details  Name: Ronnie Jones MRN: [id number] Date of Birth: Dec 26, 1950  Today's Date: 01/07/2022 OT Individual Time: 4128-2081 OT Individual Time Calculation (min): 55 min    Short Term Goals: Week 1:  OT Short Term Goal 1 (Week 1): Pt will report for 2 consecutive sessions < 3/10 pain OT Short Term Goal 2 (Week 1): Pt will sit EOB at least 3 mins during ADL task to promote OOB tolerance OT Short Term Goal 3 (Week 1): Pt will complete 1 step of dressing task at bed level with no more than min verbal cues  Skilled Therapeutic Interventions/Progress Updates:    Pt received asleep in bed, but easily awakened to voice, no c/o, but reports he "feels terrible" but unable to elaborate further, agreeable to therapy. Session focus on self-care retraining, activity tolerance in prep for improved ADL/IADL/func mobility performance + decreased caregiver burden. Noted to have been incontinent of liquid BM, pt with no awareness. Came to sitting EOB on his R with mod A to progress hemi body and to raise trunk. Close S for EOB sitting balance with LUE supported on bed rail.   Stood multiple times in stedy to allow for total A clean up for pericare/to change out brief, able to stand for ~15-20 seconds at a time before requiring seated rest break. Noted labored breathing, guided through 10 reps on IS. SatO2 at 97% on RA, HR at 86 bpm.   Total A of 2 to don pants, mod A of 2 to come into standing with B HHA. Total A to don hospital gown over heparin drip. Pt able to apply deodorant and wash face/UB with total A to incorporate RUE. Combed hair with S.  Ate 3 bites / sips of breakfast, made no attempt to pick up utensil but able to bring items to mouth when handed directly. Declined additional breakfast despite encouragement.   Pt left tilted back in TIS with wife/EVS present with safety belt alarm engaged, call bell in reach, and all immediate needs met.     Therapy  Documentation Precautions:  Precautions Precautions: Fall Precaution Comments: dense R hemiparesis Restrictions Weight Bearing Restrictions: No  Pain: no c/o throughout ADL: See Care Tool for more details.   Therapy/Group: Individual Therapy  Volanda Napoleon MS, OTR/L  01/07/2022, 6:53 AM

## 2022-01-07 NOTE — Progress Notes (Signed)
Pt states he is short of breath at the time.  ALB PRN was given. Pt states ALB helped. Pt still has EXP WHZ on R lobes.RN aware, RT will continue to monitor.

## 2022-01-07 NOTE — Progress Notes (Signed)
Physical Therapy Session Note  Patient Details  Name: Ronnie Jones MRN: [id number] Date of Birth: 01/15/1951  Today's Date: 01/07/2022 PT Individual Time: 1304-1315 and 1120-1200 PT Individual Time Calculation (min): 11 min and 40 min   Short Term Goals: Week 1:  PT Short Term Goal 1 (Week 1): Pt will tolerate sitting in WC >2 hours between therapy PT Short Term Goal 2 (Week 1): Pt will transfer to Bed with max assist of 1 consistently PT Short Term Goal 3 (Week 1): Pt will ambulate 15ft with max assist + 2  Skilled Therapeutic Interventions/Progress Updates:   Session 1.  Pt received sitting in WC and agreeable to PT. Pt transported to rehab gym kinetron reciprocal movemement training 5 x 1 min with 2 min rest breka between bouts. Cues for full ROM and reciprocal pattern. Sit<>stand at ail in hall x 2 with min assist and RLE blocked. Gait training with +2 for WC follow and blocking R knee into extension x 10ft with improved R knee activation with increased distance. Pt returned to room and performed sttedy transfer to bed with mod assist. Sit>supine completed with mod assist on the RLE, and left supine in bed with call bell in reach and all needs met.   Session 2.  Pt received supine in bed and reporting extreme fatigue. Rolling in bed R and L with mod-max assist from PT. Pt noted to have increased labored breathing. VS assessed 153//71, HR 79. Pt continues to report fatigue. Left supine in bed with call bell in reach.         Therapy Documentation Precautions:  Precautions Precautions: Fall Precaution Comments: dense R hemiparesis Restrictions Weight Bearing Restrictions: No General: PT Amount of Missed Time (min): 49 Minutes PT Missed Treatment Reason: Patient fatigue Vital Signs: Therapy Vitals Temp: 98.5 F (36.9 C) Pulse Rate: 87 Resp: 19 BP: (!) 150/67 Patient Position (if appropriate): Lying Oxygen Therapy SpO2: 96 % O2 Device: Room Air Pain:  General: 8/10.  Pt repositioned. No location given     Therapy/Group: Individual Therapy   E  01/07/2022, 10:32 PM  

## 2022-01-07 NOTE — Progress Notes (Signed)
Speech Language Pathology Daily Session Note  Patient Details  Name: Ronnie Jones MRN: [id number] Date of Birth: 1950-08-24  Today's Date: 01/07/2022 SLP Individual Time: 1000-1040 SLP Individual Time Calculation (min): 40 min  Short Term Goals: Week 2: SLP Short Term Goal 1 (Week 2): Patient will consume current diet without overt s/sx of aspiration with min-to-mod A verbal cues for use of swallowing compensatory strategies SLP Short Term Goal 2 (Week 2): Patient will be oriented x4 with min-to-mod A for use of external aids SLP Short Term Goal 3 (Week 2): Patient will complete basic problem solving with mod A verval/visual cues SLP Short Term Goal 4 (Week 2): Patient will implement speech intelligbility strategies at the word level with min-tomod A verbal cues SLP Short Term Goal 5 (Week 2): Patient will exhibit recall of recent information with mod A verbal/visual cues for use of external aids  Skilled Therapeutic Interventions: Skilled treatment session focused on dysphagia and cognitive goals. Upon arrival, patient was awake while upright in the wheelchair with his wife present. Patient declined turning off the television upon asking with minimal participation and engagement noted throughout session despite encouragement from SLP and patient's wife. Patient declined multiple snacks despite encouragement but eventually agreeable to consuming honey-thickened 2% milk and mountain dew. Patient consumed minimal amounts without overt s/s of aspiration. Recommend patient continue current diet. Spoke with wife regarding preferences for foods/liquids, she provided some insight but patient continues to demonstrate decreased PO intake. SLP provided encouragement throughout session. Patient left upright in wheelchair with alarm on and all needs within reach. Continue with current plan of care.      Pain Pain Assessment Pain Scale: 0-10 Pain Score: 0-No pain  Therapy/Group: Individual  Therapy  Scout Gumbs 01/07/2022, 12:50 PM

## 2022-01-07 NOTE — Progress Notes (Signed)
ANTICOAGULATION CONSULT NOTE - Follow Up Consult  Pharmacy Consult for Heparin Indication: DVT  Allergies  Allergen Reactions   Codeine Nausea Only    Patient Measurements: Height: 5\' 7"  (170.2 cm) Weight: 73.6 kg (162 lb 4.1 oz) IBW/kg (Calculated) : 66.1 kg Heparin Dosing Weight: 77 kg  Vital Signs: Temp: 98.3 F (36.8 C) (06/01 0455) Temp Source: Oral (06/01 0455) BP: 145/66 (06/01 0455) Pulse Rate: 98 (06/01 0455)  Labs: Recent Labs    01/05/22 [id number] 01/05/22 0715 01/06/22 0508 01/06/22 0817 01/07/22 0557  HGB 12.4*  --  11.7*  --  11.4*  HCT 36.1*  --  33.7*  --  33.0*  PLT 172  --  174  --  168  LABPROT  --   --   --  11.6  --   INR  --   --   --  0.9  --   HEPARINUNFRC QUESTIONABLE RESULTS, RECOMMEND RECOLLECT TO VERIFY 0.23*  --  0.39 0.29*  CREATININE  --   --  1.62*  --   --      Estimated Creatinine Clearance: 40.8 mL/min (A) (by C-G formula based on SCr of 1.62 mg/dL (H)).     Assessment: 71 yo male presents with left subcortical IPH and is now found to have an 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 PTA the patient is not on anticoagulation. Pharmacy is consulted to dose heparin.  Heparin level 0.29, CBC stable  Goal of Therapy:  Heparin level 0.3-0.5 units/ml - no bolus Monitor platelets by anticoagulation protocol: Yes   Plan:  Heparin to 1250 units / hr Monitor for signs and symptoms of bleeding Heparin level and CBC daily   Thank you Anette Guarneri, PharmD 01/07/2022 8:55 AM

## 2022-01-07 NOTE — Progress Notes (Addendum)
Patient complaining of left upper quadrant pain times approximately 4-1/2 hours.  He describes the pain as dull and constant.  It does not radiate into the back or pelvis.  He denies nausea, vomiting, shortness of breath.  He states he is unable to urinate, is incontinent at times and is being in and out cathed.  He is tolerating nocturnal tube feedings.  Nursing staff states stools have not been bloody.  Last BM at 10:06 AM, large in volume liquid consistency.  His vital signs are stable and he is afebrile.  Normal SaO2 on room air.  CBG 135  Abdomen is protuberant and mildly distended.  According to nursing staff this is improved as compared to yesterday.  His bowel sounds are hyperactive.  Mild tenderness to deep palpation in the left upper quadrant and right upper quadrant without rebound or guarding.  Expiratory wheezes.  KUB performed yesterday with normal bowel gas pattern.  CBC this morning with normal white blood cell count and hemoglobin stable at 11.4.  No peritoneal signs.  Will obtain chest x-ray to rule out left-sided pulmonary process.  Continue to monitor.  Narrative & Impression  CLINICAL DATA:  Acute left upper quadrant pain   EXAM: PORTABLE CHEST 1 VIEW   COMPARISON:  12/31/2021   FINDINGS: Single frontal view of the chest demonstrates enteric catheter passing below diaphragm tip excluded by collimation. Interval development of left basilar consolidation and small left pleural effusion. Trace right pleural effusion also suspected. Increased central vascular congestion. No pneumothorax. No acute bony abnormalities.   IMPRESSION: 1. Increased central vascular congestion with developing left basilar consolidation and bilateral effusions, most consistent with volume overload. Superimposed infection cannot be excluded.     Electronically Signed   By: Sharlet Salina M.D.   On: 01/07/2022 17:59    Will give Lasix 40 mg po x 1 now. Re-check chest x-ray and labs in AM

## 2022-01-07 NOTE — Progress Notes (Signed)
PROGRESS NOTE   Subjective/Complaints:  No new concerns this AM. + BM this AM.  ROS: Limited by sedation/aphasia this AM  Objective:   DG Abd 1 View  Result Date: 01/06/2022 CLINICAL DATA:  Abdominal distension. EXAM: ABDOMEN - 1 VIEW COMPARISON:  Radiographs 01/04/2022 and 01/01/2022. FINDINGS: 1029 hours. Feeding tube appears unchanged, in the right upper quadrant of the abdomen. The visualized bowel gas pattern is normal. There is retained barium within left-sided colonic diverticula. No supine evidence of free intraperitoneal air. Vascular calcifications and pelvic phleboliths are noted. There are mild degenerative changes in the spine. IMPRESSION: No evidence of acute abdominal process. Unchanged position of feeding tube. Electronically Signed   By: Carey Bullocks M.D.   On: 01/06/2022 10:44    Recent Labs    01/06/22 0508 01/07/22 0557  WBC 11.3* 8.6  HGB 11.7* 11.4*  HCT 33.7* 33.0*  PLT 174 168    Recent Labs    01/06/22 0508  NA 131*  K 3.9  CL 100  CO2 24  GLUCOSE 161*  BUN 47*  CREATININE 1.62*  CALCIUM 8.6*     Intake/Output Summary (Last 24 hours) at 01/07/2022 0723 Last data filed at 01/06/2022 2310 Gross per 24 hour  Intake 118 ml  Output 900 ml  Net -782 ml         Physical Exam: Vital Signs Blood pressure (!) 145/66, pulse 98, temperature 98.3 F (36.8 C), temperature source Oral, resp. rate 18, height 5\' 7"  (1.702 m), weight 73.6 kg, SpO2 95 %.   General: No acute distress, in bed this AM Mood and affect are appropriate Heart: Regular rate and rhythm no rubs murmurs or extra sounds, Cortrack in place Lungs: Clear to auscultation, breathing unlabored, no rales or wheezes Abdomen: Positive bowel sounds, soft nontender to palpation,+distended Extremities: No clubbing, cyanosis, or edema Skin: No evidence of breakdown, no evidence of rash Neurologic: Cranial nerves II through XII  intact, motor strength is 5/5 in Left 3- RIght deltoid, bicep, tricep, trace grip, hip flexor, knee extensors, ankle dorsiflexor and plantar flexor   Skin: RUE- wrapped in kerlex Extremities: 1+ RLE edema; 2+ pitting edema RUE distal to wrist splint  Assessment/Plan: 1. Functional deficits which require 3+ hours per day of interdisciplinary therapy in a comprehensive inpatient rehab setting. Physiatrist is providing close team supervision and 24 hour management of active medical problems listed below. Physiatrist and rehab team continue to assess barriers to discharge/monitor patient progress toward functional and medical goals  Care Tool:  Bathing  Bathing activity did not occur: Refused Body parts bathed by patient: Face         Bathing assist Assist Level: Supervision/Verbal cueing     Upper Body Dressing/Undressing Upper body dressing   What is the patient wearing?: Hospital gown only    Upper body assist Assist Level: Total Assistance - Patient < 25%    Lower Body Dressing/Undressing Lower body dressing      What is the patient wearing?: Pants     Lower body assist Assist for lower body dressing: Total Assistance - Patient < 25%     Toileting Toileting    Toileting assist Assist for  toileting: Total Assistance - Patient < 25%     Transfers Chair/bed transfer  Transfers assist     Chair/bed transfer assist level: Dependent - mechanical lift     Locomotion Ambulation   Ambulation assist   Ambulation activity did not occur: Safety/medical concerns          Walk 10 feet activity   Assist  Walk 10 feet activity did not occur: Safety/medical concerns        Walk 50 feet activity   Assist Walk 50 feet with 2 turns activity did not occur: Safety/medical concerns         Walk 150 feet activity   Assist Walk 150 feet activity did not occur: Safety/medical concerns         Walk 10 feet on uneven surface  activity   Assist Walk  10 feet on uneven surfaces activity did not occur: Safety/medical concerns         Wheelchair     Assist Is the patient using a wheelchair?: Yes Type of Wheelchair: Manual    Wheelchair assist level: Dependent - Patient 0% Max wheelchair distance: 150    Wheelchair 50 feet with 2 turns activity    Assist        Assist Level: Dependent - Patient 0%   Wheelchair 150 feet activity     Assist      Assist Level: Dependent - Patient 0%   Blood pressure (!) 145/66, pulse 98, temperature 98.3 F (36.8 C), temperature source Oral, resp. rate 18, height 5\' 7"  (1.702 m), weight 73.6 kg, SpO2 95 %.  Medical Problem List and Plan: 1. 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  Antithrombotics: -DVT/anticoagulation:  Pharmaceutical: Heparin  5/27- R brachial DVT- placed on Heparin gtt with no bolus per Dr 6/27- pharmacy managing.   5/28- Hb stable-no signs of additonal stroke/bleeding so far.              -antiplatelet therapy: none 3. Pain Management: Tylenol as needed 4. Mood: LCSW to evaluate and provide emotional support             -antipsychotic agents: n/a 5. Neuropsych: This patient is capable of making decisions on his own behalf. 6. Skin/Wound Care: Routine skin care checks 7. Fluids/Electrolytes/Nutrition: Routine Is and Os and follow-up chemistries             --dysphagia 1; honey thick- Has Cortrak in place and receiving TF's.  8: T2DM: Hgb A1c- metformin on hold due to AKI/variable intake (puree diet). --Monitor BS ac/hs and use SSI for elevated BS. --Change TF to 6 pm-6 am CBG (last 3)  Recent Labs    01/06/22 1641 01/06/22 2109 01/07/22 0707  GLUCAP 135* 181* 188*    6/1 Fair control, continue to hold metformin due to AKI/variable intake 9: Hypertension: Off Lisinopril due to AKI/hyperkalemia  --continue amlodipine, atenolol, hydralazine Vitals:   01/06/22 2233 01/07/22 0455  BP: 136/73 (!) 145/66  Pulse: 79 98  Resp:  18  Temp:  98.3 F (36.8 C)  SpO2:    6/1 well controlled, continue to follow  10:  COPD: Continue Anoro Ellipta daily.  Flutter valve qid. Smoked 2ppd - CXR OK  --Continue to taper prednisone. --Duoneb prn 5/27- SOB this AM- responded well to albuterol treatment given 5/28- added pulmicort nebs per Resp therapy- and also has albuterol scheduled 11: Alcohol abuse: cessation counseling. Off phenobarb. Check Thiamine level.  --continue B12, thiamine and folic acid 12: Tobacco abuse: cessation counseling --continue nicotine patch 13: Hyperlipidemia: LDL- 192 and Trig- 499-->continue Crestor 40 mg 14: AKI may bhave CKD : Baseline SCr-?~1.36 --Encourage --drinking honey liquids but not touching puree's.              --continue water flushes qid via cortak -Creat bumped up to 1.97- cont H2O flushes 200ml q 4 Na+ 134, hesitate increasing this , alb a little low but doubt significant 3rd spacing -Cr 1.62 yesterday, Na 131, will recheck tomorrow 15. Vitamin C deficiency: On daily supplement 16. Vitamin D deficiency: Continue weekly supplement.   17. 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 RUE swelling/weeping  Brachial DVT + weakness from CVA with dependant edema , will remove wrist splint and do retrograde massage per OT    RLE also with edema but doppler neg, EF if normal , no LLE swelling , likely related to hemiplegia  19.  Abd distension good bowel sound , dulc and SSE minimally effective, sorbitol plus SMOG enema was helpful , recheck KUB as pt still  distended, may be gas - KUB stable 20.  Urinary retention requiring I/O cath , add flomax LOS: 7 days A FACE TO FACE EVALUATION WAS PERFORMED  Fanny DanceYuri Yina Riviere 01/07/2022, 7:23 AM

## 2022-01-08 ENCOUNTER — Inpatient Hospital Stay (HOSPITAL_COMMUNITY): Payer: Medicare HMO

## 2022-01-08 DIAGNOSIS — R1310 Dysphagia, unspecified: Secondary | ICD-10-CM

## 2022-01-08 DIAGNOSIS — J441 Chronic obstructive pulmonary disease with (acute) exacerbation: Secondary | ICD-10-CM

## 2022-01-08 LAB — BASIC METABOLIC PANEL
Anion gap: 9 (ref 5–15)
BUN: 40 mg/dL — ABNORMAL HIGH (ref 8–23)
CO2: 23 mmol/L (ref 22–32)
Calcium: 8.5 mg/dL — ABNORMAL LOW (ref 8.9–10.3)
Chloride: 101 mmol/L (ref 98–111)
Creatinine, Ser: 1.55 mg/dL — ABNORMAL HIGH (ref 0.61–1.24)
GFR, Estimated: 48 mL/min — ABNORMAL LOW (ref >60)
Glucose, Bld: 259 mg/dL — ABNORMAL HIGH (ref 70–99)
Potassium: 4.5 mmol/L (ref 3.5–5.1)
Sodium: 133 mmol/L — ABNORMAL LOW (ref 135–145)

## 2022-01-08 LAB — GLUCOSE, CAPILLARY
Glucose-Capillary: 250 mg/dL — ABNORMAL HIGH (ref 70–99)
Glucose-Capillary: 134 mg/dL — ABNORMAL HIGH (ref 70–99)
Glucose-Capillary: 142 mg/dL — ABNORMAL HIGH (ref 70–99)
Glucose-Capillary: 90 mg/dL (ref 70–99)

## 2022-01-08 LAB — CBC WITH DIFFERENTIAL/PLATELET
Abs Immature Granulocytes: 0.19 10*3/uL — ABNORMAL HIGH (ref 0.00–0.07)
Basophils Absolute: 0 10*3/uL (ref 0.0–0.1)
Basophils Relative: 0 %
Eosinophils Absolute: 0.1 10*3/uL (ref 0.0–0.5)
Eosinophils Relative: 1 %
HCT: 31.8 % — ABNORMAL LOW (ref 39.0–52.0)
Hemoglobin: 11.1 g/dL — ABNORMAL LOW (ref 13.0–17.0)
Immature Granulocytes: 2 %
Lymphocytes Relative: 9 %
Lymphs Abs: 0.9 10*3/uL (ref 0.7–4.0)
MCH: 30.7 pg (ref 26.0–34.0)
MCHC: 34.9 g/dL (ref 30.0–36.0)
MCV: 88.1 fL (ref 80.0–100.0)
Monocytes Absolute: 1 10*3/uL (ref 0.1–1.0)
Monocytes Relative: 10 %
Neutro Abs: 7.7 10*3/uL (ref 1.7–7.7)
Neutrophils Relative %: 78 %
Platelets: 166 10*3/uL (ref 150–400)
RBC: 3.61 MIL/uL — ABNORMAL LOW (ref 4.22–5.81)
RDW: 12.8 % (ref 11.5–15.5)
WBC: 9.9 10*3/uL (ref 4.0–10.5)
nRBC: 0 % (ref 0.0–0.2)

## 2022-01-08 LAB — HEPARIN LEVEL (UNFRACTIONATED): Heparin Unfractionated: 0.34 IU/mL (ref 0.30–0.70)

## 2022-01-08 IMAGING — DX DG ABD PORTABLE 1V
1 series · 1 of 1 positions shown · non-contrast
Comparison: [DATE].

CLINICAL DATA: Abdominal pain.

EXAM:
PORTABLE ABDOMEN - 1 VIEW

[abdomen]
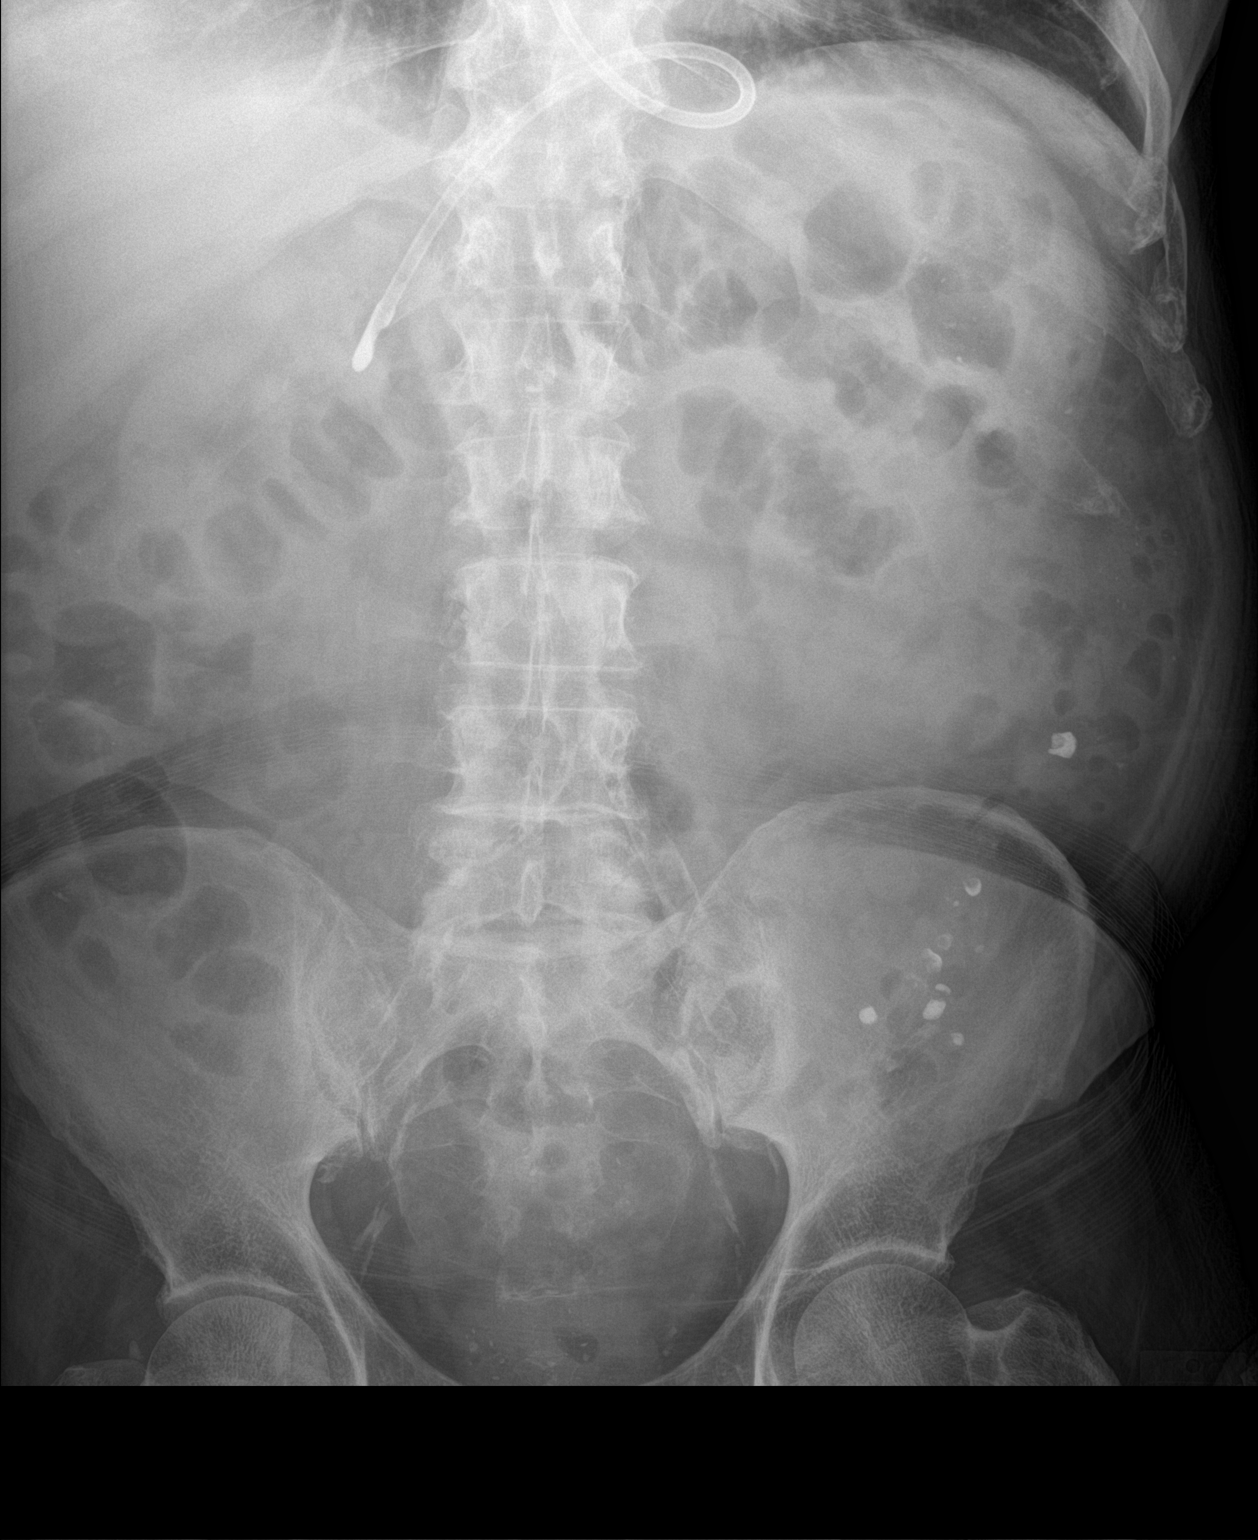

[1 of 1 positions shown; findings below may reference images not displayed]

FINDINGS: The bowel gas pattern is normal. Distal tip of feeding tube is seen
in expected position of distal stomach. No radio-opaque calculi or
other significant radiographic abnormality are seen.
IMPRESSION: No abnormal bowel dilatation is noted.

## 2022-01-08 IMAGING — DX DG CHEST 1V
1 series · 1 of 1 positions shown · non-contrast
Comparison: Portable chest yesterday at [DATE] p.m.

CLINICAL DATA: Shortness of breath. Evaluate left basilar
consolidation, pleural effusion, pneumothorax.

EXAM:
CHEST  1 VIEW

[chest]
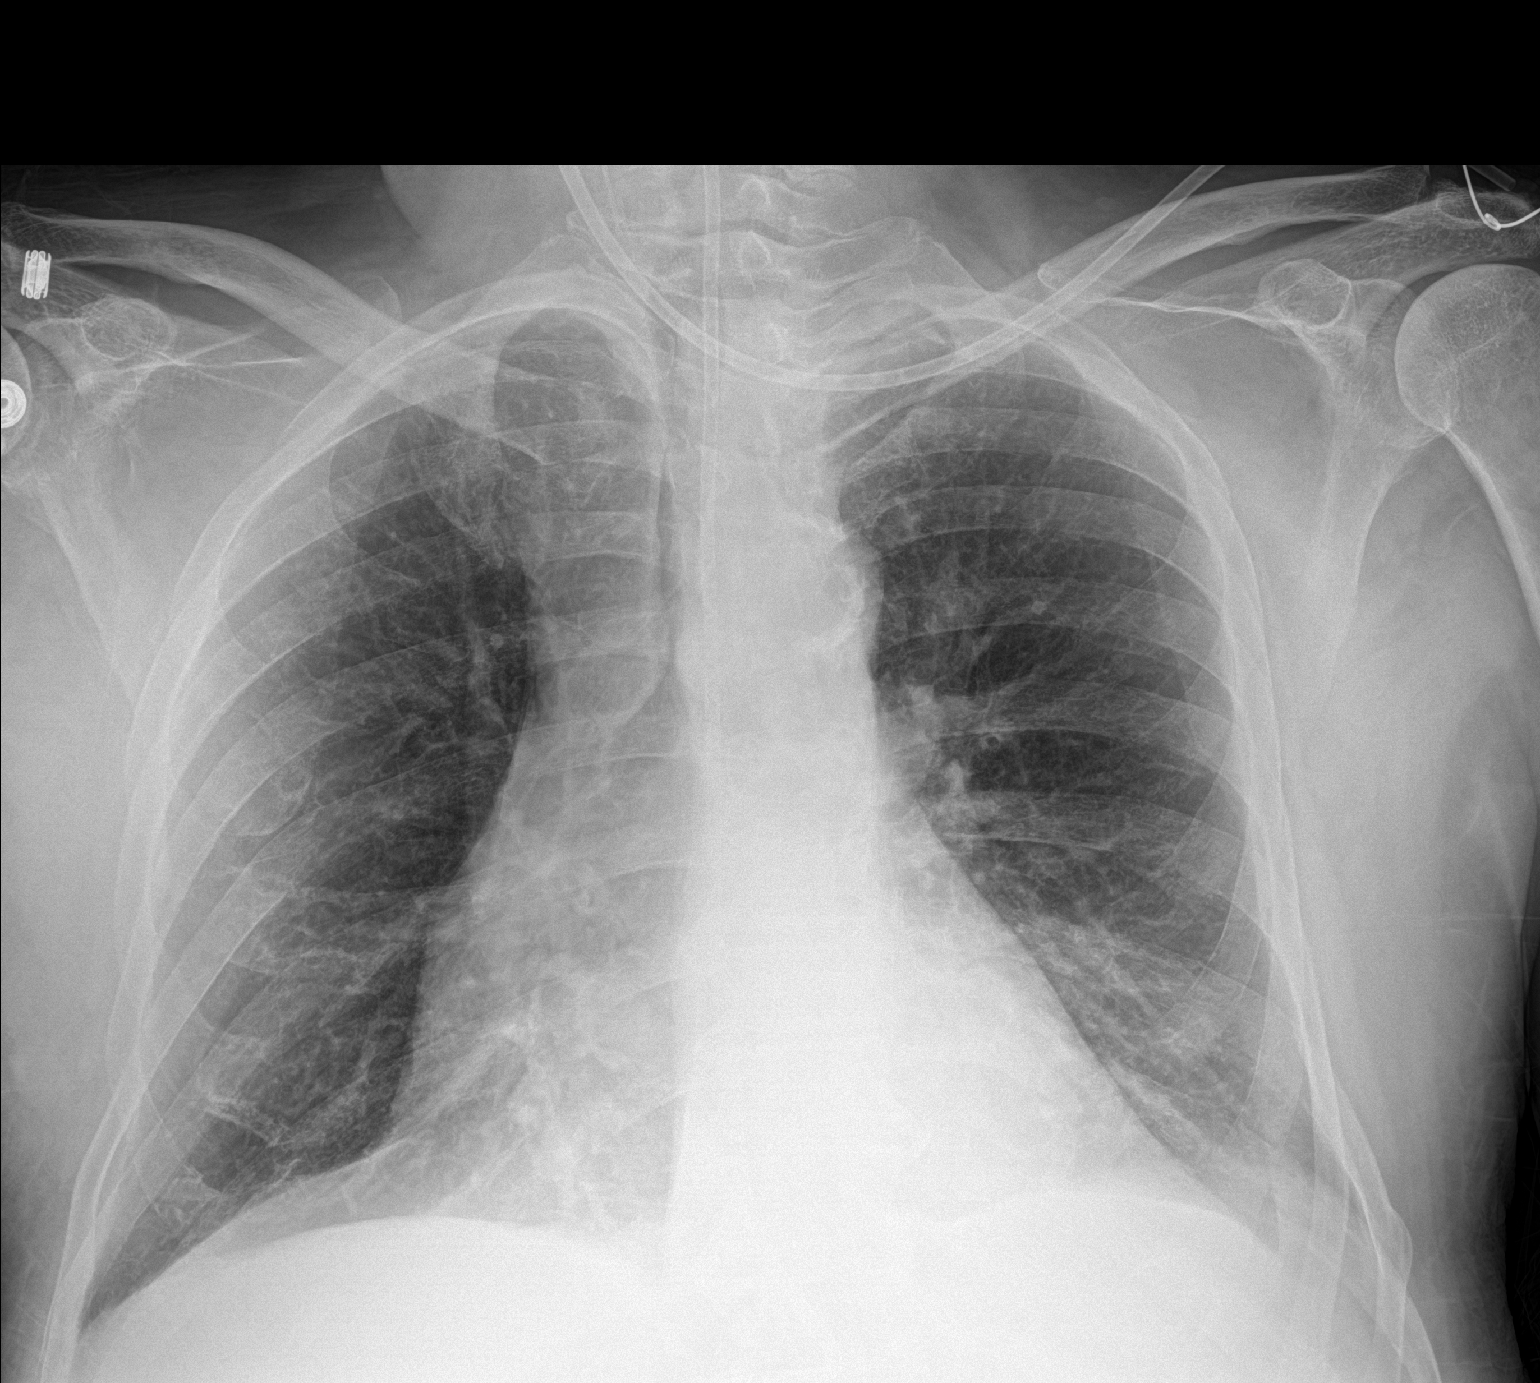

[1 of 1 positions shown; findings below may reference images not displayed]

FINDINGS: [DATE] a.m., [DATE]. There is mild cardiomegaly without evidence
of CHF.

Patchy hazy opacity in the left lung base is seen with improvement,
with small left pleural effusion also improved versus redistributed.

The remaining lungs are clear. The mediastinal configuration is
stable with aortic atherosclerosis.

There is no visible pneumothorax.  No acute osseous abnormality.

Feeding tube enters the stomach with the intragastric course of the
tube not filmed.
IMPRESSION: 1. Improved opacities in the left base with left pleural effusion
either smaller in size or redistributed today.
2. Vascular congestion is no longer seen.
3. No visible pneumothorax.
4. Aortic atherosclerosis.

## 2022-01-08 NOTE — Progress Notes (Signed)
Physical Therapy Session Note  Patient Details  Name: Finch Costanzo MRN: [id number] Date of Birth: 10/08/1950  Today's Date: 01/08/2022 PT Individual Time: 1020-1058 PT Individual Time Calculation (min): 38 min   Short Term Goals: Week 1:  PT Short Term Goal 1 (Week 1): Pt will tolerate sitting in WC >2 hours between therapy PT Short Term Goal 2 (Week 1): Pt will transfer to Bed with max assist of 1 consistently PT Short Term Goal 3 (Week 1): Pt will ambulate 42f with max assist + 2   Skilled Therapeutic Interventions/Progress Updates:   Pt received supine in bed and agreeable to PT at ben level due to severe R LE pain. Pt does not identify location in RLE, but states the "whole damn thing" hurts. PT performed RLE AAROM: heel cord stretch 2 x 45 sec. HS stretch 2 x 1 min, hip IR/ER 2 x 30 sec. Hip adductor stretch 2 x 1 min. Pt with mild facial grimmace with initiation of each movement, but pain subsided with stretch. BLE NMR: SLR, hip abduction/adduction, hip/knee flexion/extension in synergy, and clam shell. Each performed 2 x 10 with AAROM on the RLE with manual resistance for synergistic movements. Pt noted to be falling alseep between  bout, requiring increasing stimuli to arouse. Left supine in bed with call bell in reach and all needs met.  S      Therapy Documentation Precautions:  Precautions Precautions: Fall Precaution Comments: dense R hemiparesis Restrictions Weight Bearing Restrictions: No    Vital Signs: Therapy Vitals Pulse Rate: 80 Resp: 18 Patient Position (if appropriate): Lying Oxygen Therapy SpO2: 97 % O2 Device: Room Air Pain: Pain Assessment Pain Scale: Faces Faces Pain Scale: Hurts even more Pain Location: Hip Pain Orientation: Right Pain Intervention(s): Medication (See eMAR)   Therapy/Group: Individual Therapy  ALorie Phenix6/09/2021, 11:05 AM

## 2022-01-08 NOTE — Progress Notes (Signed)
Occupational Therapy Weekly Progress Note  Patient Details  Name: Ronnie Jones MRN: [id number] Date of Birth: 1951/04/24  Beginning of progress report period: Jan 01, 2022 End of progress report period: January 08, 2022  Today's Date: 01/08/2022 OT Individual Time: 7253-6644 OT Individual Time Calculation (min): 73 min + 40 min   Patient has met 3 of 3 short term goals.  Pt has made slow progress this week towards LTG. Pt has demonstrated improved activity tolerance, static and standing sitting balance , but continues to require S for seated grooming, min for self-feeding, and total A for UB/LB bathing, dressing, and toileting with assist of 2 with use of stedy. Can transfer to the toilet with min A of 1 in stedy and is completed squat-pivot with mod A of 2 or max of 1. Pt cont to be primarily limited by dense R hemiplegia, impaired cognition, and fatigue/labored breathing. RUE and hand currently assessed at Brummstrom level II and I respectively. Anticipate 24/7 S and mod physical assist required upon DC.   Patient continues to demonstrate the following deficits: muscle weakness, decreased cardiorespiratoy endurance, impaired timing and sequencing, abnormal tone, unbalanced muscle activation, decreased coordination, and decreased motor planning, decreased attention to right and decreased motor planning, decreased initiation, decreased attention, decreased awareness, decreased problem solving, decreased safety awareness, decreased memory, and delayed processing, and decreased sitting balance, decreased standing balance, decreased postural control, hemiplegia, and decreased balance strategies and therefore will continue to benefit from skilled OT intervention to enhance overall performance with BADL and Reduce care partner burden.  Patient progressing toward long term goals..  Continue plan of care.  OT Short Term Goals Week 1:  OT Short Term Goal 1 (Week 1): Pt will report for 2 consecutive  sessions < 3/10 pain OT Short Term Goal 1 - Progress (Week 1): Met OT Short Term Goal 2 (Week 1): Pt will sit EOB at least 3 mins during ADL task to promote OOB tolerance OT Short Term Goal 2 - Progress (Week 1): Met OT Short Term Goal 3 (Week 1): Pt will complete 1 step of dressing task at bed level with no more than min verbal cues OT Short Term Goal 3 - Progress (Week 1): Met Week 2:  OT Short Term Goal 1 (Week 2): Pt will don shirt with mod A. OT Short Term Goal 2 (Week 2): Pt will don pants with mod A. OT Short Term Goal 3 (Week 2): Pt will complete >3 grooming tasks at sink with S.  Skilled Therapeutic Interventions/Progress Updates:    Session 1 ([id number]): Pt received semi-reclined in bed with RN present, reports feeling like "shit," but unable to elaborate further and, agreeable to therapy. Session focus on self-care retraining, activity tolerance, RUE NMR in prep for improved ADL/IADL/func mobility performance + decreased caregiver burden. Came to sitting EOB with mod A to progress hemi body and to lift trunk. Close S for static sitting balance. Total A to don B shoes and swap out hospital gown. Min A to power up in stedy > TIS as no +2 available.   Washed face and underarms with assist to support RUE. Utilized shampoo cap and pt able to scrub in shampoo and comb hair with S with LUE.   PROM to RUE joints. Pt completed 2x10 shoulder shrugs, scapular retraction + forward circles, chest press, and B shoulder flexion with use of 1 lb dowel rod and ace wrap to R grip. No noted activation against gravity this date.   Pt reports  back pain, RN notified at pt request for intervention.  Pt left tilted back in TIS with safety belt alarm engaged, call bell in reach, and all immediate needs met.    Session 2 [phone]): Pt received semi-reclined in bed with wife present, no reports of pain but later endorses "leg" pain, agreeable to therapy. Session focus on transfer retraining/RUE NMR in prep  for improved ADL/IADL/func mobility performance + decreased caregiver burden.   Came to sitting EOB with mod A to progress hemi body and to lift trunk. Close S for static sitting balance. Total A to don B shoes bed level. Squat-pivot with mod A of 2 to lift and pivot hips > TIS on his L. Dep TIS transport to and from gym. Completed 2 more squat-pivots at similar levels to and from mat table.   Pt completed 1x5 sit to stands with LUE on chair back and overall mod A of 2 and focus on eccentric control into sitting, assist to block RLE. Completed 2x5 mini squats/push-ups with RUE supported on stool to promote RUE WB. Finally, completed 2x10 R lateral leans to promote RUE WB/NMR. Pt reports he feels a "pressure."  Pt left  semi-reclined in bed with HOB at 30 degrees, RN present with bed alarm engaged, call bell in reach, and all immediate needs met.    Therapy Documentation Precautions:  Precautions Precautions: Fall Precaution Comments: dense R hemiparesis Restrictions Weight Bearing Restrictions: No  Pain: see session notes    ADL: See Care Tool for more details.  Therapy/Group: Individual Therapy  Volanda Napoleon MS, OTR/L  01/08/2022, 6:51 AM

## 2022-01-08 NOTE — Progress Notes (Signed)
ANTICOAGULATION CONSULT NOTE - Follow Up Consult  Pharmacy Consult for Heparin Indication: DVT  Allergies  Allergen Reactions   Codeine Nausea Only    Patient Measurements: Height: 5\' 7"  (170.2 cm) Weight: 76.6 kg (168 lb 14 oz) IBW/kg (Calculated) : 66.1 kg Heparin Dosing Weight: 77 kg  Vital Signs: Temp: 98 F (36.7 C) (06/02 0312) BP: 156/64 (06/02 0315) Pulse Rate: 80 (06/02 0735)  Labs: Recent Labs    01/06/22 0508 01/06/22 0817 01/07/22 0557 01/08/22 0537  HGB 11.7*  --  11.4* 11.1*  HCT 33.7*  --  33.0* 31.8*  PLT 174  --  168 166  LABPROT  --  11.6  --   --   INR  --  0.9  --   --   HEPARINUNFRC  --  0.39 0.29* 0.34  CREATININE 1.62*  --   --  1.55*     Estimated Creatinine Clearance: 42.6 mL/min (A) (by C-G formula based on SCr of 1.55 mg/dL (H)).     Assessment: 71 yo male presents with left subcortical IPH and is now found to have an 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 PTA the patient is not on anticoagulation. Pharmacy is consulted to dose heparin.  Heparin level therapeutic, CBC stable  Goal of Therapy:  Heparin level 0.3-0.5 units/ml - no bolus Monitor platelets by anticoagulation protocol: Yes   Plan:  Continue Heparin at 1250 units / hr Monitor for signs and symptoms of bleeding Heparin level and CBC daily  Continue to follow for change to po anti-coag   Thank you Anette Guarneri, PharmD 01/08/2022 8:24 AM

## 2022-01-08 NOTE — Progress Notes (Signed)
PROGRESS NOTE   Subjective/Complaints:  RUE abdominal distension continues. Yesterday he got CXR and lasix.   ROS: Limited by sedation/aphasia this AM  Objective:   DG Chest 1 View  Result Date: 01/08/2022 CLINICAL DATA:  Shortness of breath. Evaluate left basilar consolidation, pleural effusion, pneumothorax. EXAM: CHEST  1 VIEW COMPARISON:  Portable chest yesterday at 5:34 p.m. FINDINGS: 4:54 a.m., 01/08/2022. There is mild cardiomegaly without evidence of CHF. Patchy hazy opacity in the left lung base is seen with improvement, with small left pleural effusion also improved versus redistributed. The remaining lungs are clear. The mediastinal configuration is stable with aortic atherosclerosis. There is no visible pneumothorax.  No acute osseous abnormality. Feeding tube enters the stomach with the intragastric course of the tube not filmed. IMPRESSION: 1. Improved opacities in the left base with left pleural effusion either smaller in size or redistributed today. 2. Vascular congestion is no longer seen. 3. No visible pneumothorax. 4. Aortic atherosclerosis. Electronically Signed   By: Almira Bar M.D.   On: 01/08/2022 06:27   DG Abd 1 View  Result Date: 01/06/2022 CLINICAL DATA:  Abdominal distension. EXAM: ABDOMEN - 1 VIEW COMPARISON:  Radiographs 01/04/2022 and 01/01/2022. FINDINGS: 1029 hours. Feeding tube appears unchanged, in the right upper quadrant of the abdomen. The visualized bowel gas pattern is normal. There is retained barium within left-sided colonic diverticula. No supine evidence of free intraperitoneal air. Vascular calcifications and pelvic phleboliths are noted. There are mild degenerative changes in the spine. IMPRESSION: No evidence of acute abdominal process. Unchanged position of feeding tube. Electronically Signed   By: Carey Bullocks M.D.   On: 01/06/2022 10:44   DG Chest Port 1 View  Result Date:  01/07/2022 CLINICAL DATA:  Acute left upper quadrant pain EXAM: PORTABLE CHEST 1 VIEW COMPARISON:  12/31/2021 FINDINGS: Single frontal view of the chest demonstrates enteric catheter passing below diaphragm tip excluded by collimation. Interval development of left basilar consolidation and small left pleural effusion. Trace right pleural effusion also suspected. Increased central vascular congestion. No pneumothorax. No acute bony abnormalities. IMPRESSION: 1. Increased central vascular congestion with developing left basilar consolidation and bilateral effusions, most consistent with volume overload. Superimposed infection cannot be excluded. Electronically Signed   By: Sharlet Salina M.D.   On: 01/07/2022 17:59    Recent Labs    01/07/22 0557 01/08/22 0537  WBC 8.6 9.9  HGB 11.4* 11.1*  HCT 33.0* 31.8*  PLT 168 166    Recent Labs    01/06/22 0508 01/08/22 0537  NA 131* 133*  K 3.9 4.5  CL 100 101  CO2 24 23  GLUCOSE 161* 259*  BUN 47* 40*  CREATININE 1.62* 1.55*  CALCIUM 8.6* 8.5*     Intake/Output Summary (Last 24 hours) at 01/08/2022 0859 Last data filed at 01/08/2022 0740 Gross per 24 hour  Intake 6937.07 ml  Output 3350 ml  Net 3587.07 ml         Physical Exam: Vital Signs Blood pressure (!) 156/64, pulse 80, temperature 98 F (36.7 C), resp. rate 18, height 5\' 7"  (1.702 m), weight 76.6 kg, SpO2 97 %.   General: No acute distress, in bed this  AM Mood and affect are appropriate Heart: Regular rate and rhythm no rubs murmurs or extra sounds, Cortrack in place Lungs: Clear to auscultation, breathing unlabored, no rales or wheezes Abdomen: Positive bowel sounds, minimal tenderness LUQ, no rebound or guarding,+distended Extremities: No clubbing, cyanosis, or edema Skin: No evidence of breakdown, no evidence of rash Neurologic: Cranial nerves II through XII intact, motor strength is 5/5 in Left 3- RIght deltoid, bicep, tricep, trace grip, hip flexor, knee extensors,  ankle dorsiflexor and plantar flexor   Skin: RUE- wrapped in kerlex Extremities: 1+ RLE edema; 2+ pitting edema RUE distal to wrist splint  Assessment/Plan: 1. Functional deficits which require 3+ hours per day of interdisciplinary therapy in a comprehensive inpatient rehab setting. Physiatrist is providing close team supervision and 24 hour management of active medical problems listed below. Physiatrist and rehab team continue to assess barriers to discharge/monitor patient progress toward functional and medical goals  Care Tool:  Bathing  Bathing activity did not occur: Refused Body parts bathed by patient: Face         Bathing assist Assist Level: Supervision/Verbal cueing     Upper Body Dressing/Undressing Upper body dressing   What is the patient wearing?: Hospital gown only    Upper body assist Assist Level: Total Assistance - Patient < 25%    Lower Body Dressing/Undressing Lower body dressing      What is the patient wearing?: Pants     Lower body assist Assist for lower body dressing: Total Assistance - Patient < 25%     Toileting Toileting    Toileting assist Assist for toileting: Total Assistance - Patient < 25%     Transfers Chair/bed transfer  Transfers assist     Chair/bed transfer assist level: Dependent - mechanical lift     Locomotion Ambulation   Ambulation assist   Ambulation activity did not occur: Safety/medical concerns          Walk 10 feet activity   Assist  Walk 10 feet activity did not occur: Safety/medical concerns        Walk 50 feet activity   Assist Walk 50 feet with 2 turns activity did not occur: Safety/medical concerns         Walk 150 feet activity   Assist Walk 150 feet activity did not occur: Safety/medical concerns         Walk 10 feet on uneven surface  activity   Assist Walk 10 feet on uneven surfaces activity did not occur: Safety/medical concerns          Wheelchair     Assist Is the patient using a wheelchair?: Yes Type of Wheelchair: Manual    Wheelchair assist level: Dependent - Patient 0% Max wheelchair distance: 150    Wheelchair 50 feet with 2 turns activity    Assist        Assist Level: Dependent - Patient 0%   Wheelchair 150 feet activity     Assist      Assist Level: Dependent - Patient 0%   Blood pressure (!) 156/64, pulse 80, temperature 98 F (36.7 C), resp. rate 18, height 5\' 7"  (1.702 m), weight 76.6 kg, SpO2 97 %.  Medical Problem List and Plan: 1. 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  Antithrombotics: -DVT/anticoagulation:  Pharmaceutical: Heparin  5/27- R brachial DVT- placed on Heparin gtt with no bolus per Dr Pearlean BrownieSethi- pharmacy managing.   5/28- Hb stable-no signs of additonal stroke/bleeding so far.              -antiplatelet therapy: none 3. Pain Management: Tylenol as needed 4. Mood: LCSW to evaluate and provide emotional support             -antipsychotic agents: n/a 5. Neuropsych: This patient is capable of making decisions on his own behalf. 6. Skin/Wound Care: Routine skin care checks 7. Fluids/Electrolytes/Nutrition: Routine Is and Os and follow-up chemistries             --dysphagia 1; honey thick- Has Cortrak in place and receiving TF's.  8: T2DM: Hgb A1c- metformin on hold due to AKI/variable intake (puree diet). --Monitor BS ac/hs and use SSI for elevated BS. --Change TF to 6 pm-6 am CBG (last 3)  Recent Labs    01/07/22 1632 01/07/22 2052 01/08/22 0544  GLUCAP 138* 176* 250*   6/1 Fair control, continue to hold metformin due to AKI/variable intake 9:  Hypertension: Off Lisinopril due to AKI/hyperkalemia  --continue amlodipine, atenolol, hydralazine Vitals:   01/08/22 0315 01/08/22 0735  BP: (!) 156/64   Pulse:  80  Resp:  18  Temp:    SpO2:  97%  6/1 well controlled, continue to follow  10:  COPD: Continue Anoro Ellipta daily.  Flutter valve qid. Smoked 2ppd - CXR OK  --Continue to taper prednisone. --Duoneb prn 5/27- SOB this AM- responded well to albuterol treatment given 5/28- added pulmicort nebs per Resp therapy- and also has albuterol scheduled 11: Alcohol abuse: cessation counseling. Off phenobarb. Check Thiamine level.  --continue B12, thiamine and folic acid -6/2 yesterday he got lasix for vascular congestion after he reported LUQ pain.  12: Tobacco abuse: cessation counseling --continue nicotine patch 13: Hyperlipidemia: LDL- 192 and Trig- 499-->continue Crestor 40 mg 14: AKI may bhave CKD : Baseline SCr-?~1.36 --Encourage --drinking honey liquids but not touching puree's.              --continue water flushes qid via cortak -Creat bumped up to 1.97- cont H2O flushes 200ml q 4 Na+ 134, hesitate increasing this , alb a little low but doubt significant 3rd spacing -6/2 Cr a little improved 1.55 15. Vitamin C deficiency: On daily supplement 16. Vitamin D deficiency: Continue weekly supplement.   17. 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 RUE swelling/weeping  Brachial DVT + weakness from CVA with dependant edema , will remove wrist splint and do retrograde massage per OT    RLE also with edema but doppler neg, EF if normal , no LLE swelling , likely related to hemiplegia  19.  Abd distension good bowel sound , dulc and SSE minimally effective, sorbitol plus SMOG enema was helpful , recheck KUB as pt still distended, may be gas - KUB stable -6/2  Repeat KUB ordered. Labs were stable.  20.  Urinary retention requiring I/O cath , add flomax LOS: 8 days A FACE TO FACE EVALUATION WAS PERFORMED  Fanny DanceYuri Destiny Trickey 01/08/2022, 8:59 AM

## 2022-01-08 NOTE — Progress Notes (Signed)
Speech Language Pathology Weekly Progress and Session Note  Patient Details  Name: Ronnie Jones MRN: [id number] Date of Birth: 14-Sep-1950  Beginning of progress report period: Jan 01, 2022 End of progress report period: January 08, 2022  Today's Date: 01/08/2022 SLP Individual Time: 5035-4656 SLP Individual Time Calculation (min): 44 min  Short Term Goals: Week 1: SLP Short Term Goal 1 (Week 1): Patient will consume current diet without overt s/sx of aspiration with mod A verbal cues for use of swallowing compensatory strategies SLP Short Term Goal 1 - Progress (Week 1): Met SLP Short Term Goal 2 (Week 1): Patient will be oriented x4 with mod A for use of external aids SLP Short Term Goal 2 - Progress (Week 1): Met SLP Short Term Goal 3 (Week 1): Patient will implement speech intelligbility strategies at the word level with mod A verbal cues SLP Short Term Goal 3 - Progress (Week 1): Met SLP Short Term Goal 4 (Week 1): Patient will complete basic problem solving with mod A verval/visual cues SLP Short Term Goal 4 - Progress (Week 1): Not met SLP Short Term Goal 5 (Week 1): Patient will exhibit recall of recent information with mod A verbal/visual cues for use of external aids SLP Short Term Goal 5 - Progress (Week 1): Not met SLP Short Term Goal 6 (Week 1): Patient will participate in further cognitive-linguistic evaluation SLP Short Term Goal 6 - Progress (Week 1): Met    New Short Term Goals: Week 2: SLP Short Term Goal 1 (Week 2): Patient will consume current diet without overt s/sx of aspiration with min-to-mod A verbal cues for use of swallowing compensatory strategies SLP Short Term Goal 2 (Week 2): Patient will be oriented x4 with min-to-mod A for use of external aids SLP Short Term Goal 3 (Week 2): Patient will complete basic problem solving with mod A verval/visual cues SLP Short Term Goal 4 (Week 2): Patient will implement speech intelligbility strategies at the word level with  min-tomod A verbal cues SLP Short Term Goal 5 (Week 2): Patient will exhibit recall of recent information with mod A verbal/visual cues for use of external aids  Weekly Progress Updates:Patient has made slow gains and has met 3 of 5 STGs this reporting period. Progress appears impacted by fatigue and motivation/interest. Patient is currently completing cognitive tasks with overall mod-to-max A verbal/visual cues in regards to orientation, basic problem solving, functional recall, and awareness. Pt demonstrates mild improvement with speech intelligibility and currently requiring mod A to implement strategies at the word level. Pt is consuming a dysphagia 2 diet with honey thick liquids and min-to-mod A for implementation of safe swallowing precautions/strategies under full supervision with PO intake. Oral intake has been poor due to poor appetite. Cortrak still in place for supplemental nutrition. Patient and family education is ongoing. Patient would benefit from continued skilled SLP intervention to maximize cognitive, speech, and swallow functioning and overall functional independence prior to discharge.       Intensity: Minumum of 1-2 x/day, 30 to 90 minutes Frequency: 3 to 5 out of 7 days Duration/Length of Stay: 3.5-4 weeks Treatment/Interventions: Cognitive remediation/compensation;Environmental controls;Internal/external aids;Cueing hierarchy;Dysphagia/aspiration precaution training;Functional tasks;Patient/family education;Therapeutic Activities   Daily Session  Skilled Therapeutic Interventions: Skilled ST services focused on cognitive skills. Pt was orientated to place and situation with mod A verbal cues for use of visual aids for time. SLP facilitated recall and problem solving skills in novel card tasks. Pt was able to sort cards by 6 colors mod  I and by 5 shapes with supervision A verbal cues fading to mod I. Pt demonstrated basic problem solving skills in card task Blink, requiring mod  A verbal cues for recall with visual aid and to apply rules. Pt was left in room with wife, call bell within reach and bed alarm set. SLP recommends to continue skilled services.     General    Pain Pain Assessment Pain Scale: Faces Faces Pain Scale: Hurts even more Pain Location: Hip Pain Orientation: Right Pain Intervention(s): Medication (See eMAR)  Therapy/Group: Individual Therapy  Terrah Decoster  [facility] 01/08/2022, 1:02 PM

## 2022-01-09 ENCOUNTER — Inpatient Hospital Stay (HOSPITAL_COMMUNITY): Payer: Medicare HMO

## 2022-01-09 LAB — CBC
HCT: 32.5 % — ABNORMAL LOW (ref 39.0–52.0)
Hemoglobin: 11.1 g/dL — ABNORMAL LOW (ref 13.0–17.0)
MCH: 30.2 pg (ref 26.0–34.0)
MCHC: 34.2 g/dL (ref 30.0–36.0)
MCV: 88.3 fL (ref 80.0–100.0)
Platelets: 165 10*3/uL (ref 150–400)
RBC: 3.68 MIL/uL — ABNORMAL LOW (ref 4.22–5.81)
RDW: 12.7 % (ref 11.5–15.5)
WBC: 8.5 10*3/uL (ref 4.0–10.5)
nRBC: 0 % (ref 0.0–0.2)

## 2022-01-09 LAB — GLUCOSE, CAPILLARY
Glucose-Capillary: 211 mg/dL — ABNORMAL HIGH (ref 70–99)
Glucose-Capillary: 143 mg/dL — ABNORMAL HIGH (ref 70–99)
Glucose-Capillary: 132 mg/dL — ABNORMAL HIGH (ref 70–99)
Glucose-Capillary: 158 mg/dL — ABNORMAL HIGH (ref 70–99)

## 2022-01-09 LAB — HEPARIN LEVEL (UNFRACTIONATED): Heparin Unfractionated: 0.28 IU/mL — ABNORMAL LOW (ref 0.30–0.70)

## 2022-01-09 MED ORDER — FUROSEMIDE 20 MG PO TABS: 20.0000 mg | ORAL_TABLET | Freq: Once | ORAL | Status: DC | PRN

## 2022-01-09 MED ORDER — IOHEXOL 9 MG/ML PO SOLN
ORAL | Status: AC
  Filled 2022-01-09: qty 1000

## 2022-01-09 MED ORDER — FUROSEMIDE 10 MG/ML IJ SOLN
20.0000 mg | Freq: Once | INTRAMUSCULAR | Status: AC
  Administered 2022-01-09: 20 mg via INTRAVENOUS
  Filled 2022-01-09: qty 2

## 2022-01-09 NOTE — Progress Notes (Signed)
ANTICOAGULATION CONSULT NOTE - Follow Up Consult  Pharmacy Consult for Heparin Indication: DVT  Allergies  Allergen Reactions   Codeine Nausea Only    Patient Measurements: Height: 5\' 7"  (170.2 cm) Weight: 76.9 kg (169 lb 8.5 oz) IBW/kg (Calculated) : 66.1 kg Heparin Dosing Weight: 77 kg  Vital Signs: Temp: 98.2 F (36.8 C) (06/03 0456) Temp Source: Oral (06/03 0456) BP: 147/67 (06/03 0456) Pulse Rate: 80 (06/03 0456)  Labs: Recent Labs    01/07/22 0557 01/08/22 0537 01/09/22 0552  HGB 11.4* 11.1* 11.1*  HCT 33.0* 31.8* 32.5*  PLT 168 166 165  HEPARINUNFRC 0.29* 0.34 0.28*  CREATININE  --  1.55*  --      Estimated Creatinine Clearance: 42.6 mL/min (A) (by C-G formula based on SCr of 1.55 mg/dL (H)).   Assessment: 71 yo male presents with left subcortical IPH and is now found to have an 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 PTA the patient is not on anticoagulation. Pharmacy is consulted to dose heparin.  Heparin level 0.28 and below therapeutic goal this AM. Hgb remains stable at 11.1, platelets are within normal limits. No signs of bleeding noted per RN.   Goal of Therapy:  Heparin level 0.3-0.5 units/ml - no bolus Monitor platelets by anticoagulation protocol: Yes   Plan:  Increase IV heparin gtt to 1350 units/hr 8h heparin level Daily heparin level and CBC Monitor for signs and symptoms of bleeding F/u plans for oral anticoagulation when able  Continue to follow for change to po anti-coag  Thank you for involving pharmacy in this patient's care.  75, PharmD PGY1 Ambulatory Care Pharmacy Resident 01/09/2022 8:23 AM  **Pharmacist phone directory can be found on [email or web address] listed under [facility]**

## 2022-01-09 NOTE — Progress Notes (Signed)
Patient reports increased shortness of breath. On assessment 02 saturation 97%, lungs with expiratory wheeze and rhonchi. States that he does have a mild/productive cough but unable to assess sputum at this time. Provided PRN nebulizer without relief. Provider and respiratory notified. Pending orders for chest xray, IV lasix, and oxygen for comfort.

## 2022-01-09 NOTE — Progress Notes (Addendum)
Physical Therapy Weekly Progress Note  Patient Details  Name: Ronnie Jones MRN: [id number] Date of Birth: 04-08-1951  Beginning of progress report period: Jan 01, 2022 End of progress report period: January 09, 2022  Today's Date: 01/09/2022 PT Individual Time: 0800-0908 PT Individual Time Calculation (min): 68 min   Patient has met 3 of 3 short term goals.  Pt is making slow progress towards LTG for mod assist overall. Pts wife has not initiated hand on education for mobility at this point. Pt has progressed to max assist bed mobility and transfer and short distance gait with max assist +2 for safety. Limited by sensory deficits and severe fatigue with all mobility.   Patient continues to demonstrate the following deficits muscle weakness, muscle joint tightness, and muscle paralysis, decreased cardiorespiratoy endurance, abnormal tone, unbalanced muscle activation, motor apraxia, decreased coordination, and decreased motor planning, decreased midline orientation, decreased attention to right, and ideational apraxia, decreased initiation, decreased attention, decreased awareness, decreased problem solving, decreased safety awareness, decreased memory, and delayed processing, and decreased sitting balance, decreased standing balance, decreased postural control, hemiplegia, and decreased balance strategies and therefore will continue to benefit from skilled PT intervention to increase functional independence with mobility.  Patient progressing toward long term goals..  Continue plan of care.  PT Short Term Goals Week 1:  PT Short Term Goal 1 (Week 1): Pt will tolerate sitting in WC >2 hours between therapy PT Short Term Goal 1 - Progress (Week 1): Met PT Short Term Goal 2 (Week 1): Pt will transfer to Bed with max assist of 1 consistently PT Short Term Goal 2 - Progress (Week 1): Met PT Short Term Goal 3 (Week 1): Pt will ambulate 70f with max assist + 2 PT Short Term Goal 3 - Progress (Week 1):  Met Week 2:  PT Short Term Goal 1 (Week 2): Pt will transfer with mod assist consistently without lift PT Short Term Goal 2 (Week 2): Pt wil will ambulate 373fwith LRAD and max assist of 1 PT Short Term Goal 3 (Week 2): Pt will propell WC [id number] min assist  Skilled Therapeutic Interventions/Progress Updates:   Pt received supine in bed and agreeable to PT. Noted to have been incontinent bowel movement. Rolling R and L for hygiene with min assist to the R and mod assist to the L.  PT performed pericare while pt in sudelying. Supine>sit transfer with max assist and max cues for sequencing. Sitting balance EOB x 3 minutes with cues initially for midline then noted to have mid overcorrection with slight weight shift to the L side.   Squat pivot transfers with max assist R and L with moderate cues for safety and improved push into partial stand prior to rotating pelvis. Performed x 2 in session. Seated NMR for hip/knee flexion/extension in synergy to activate motor planning for WB in stance on the RLE x 10 with cues for ful ORM  Sit<>stand at rail inhall with mod assist x 2 with cues for initiation and weight shift into the RLE. PT then applied AFO to the RLE  Gait training at rail in hall x [id number] max assist and +2 for WC follow.  Noted to have activation on RLE into extension for initial 15ft60f partial hip flexion for advancement. Requiring increased assist with fatigue for last 10ft.37frequired 5 minute seated rest break due to mild SOB following gait. SpO2 assessed 98%.   WC mobility with LLE only x 50ft w17fmin-mod assist  from PT with cues for contrinued participation for last 31f with improved ROM at L knee.   Pt returned to room and performed squat pivot transfer to bed with max assist and moderate cues as listed above for set up, initiation, and sequencing. Sit>supine completed with mod assist, and left supine in bed with call bell in reach and all needs met.       Therapy  Documentation Precautions:  Precautions Precautions: Fall Precaution Comments: dense R hemiparesis Restrictions Weight Bearing Restrictions: No  Vital Signs: Oxygen Therapy SpO2: 96 % O2 Device: Room Air Pain:   Faces: none.   Therapy/Group: Individual Therapy  ALorie Phenix6/10/2021, 9:52 AM

## 2022-01-09 NOTE — Progress Notes (Addendum)
Received call from floor nurse that patient what short of breath despite having an albuterol treatment.  Oxygen sats 97 on room air.  Placed order for 20 mg lasix IV and STAT CXR and oxygen therapy order for 2L [place]. Also, asked to paged respiratory and notified Dr. Carlis Abbott, MD.  Continue to monitor, awaiting CXR.

## 2022-01-09 NOTE — Progress Notes (Signed)
PROGRESS NOTE   Subjective/Complaints: Patient seen at bedside today.  RU E abdominal distention continues 2 days ago he got CXR and Lasix patient is to get CT abdomen today.  ROS: Patient positive for abdominal distention and right lower leg edema and right upper extremity edema distal to splint no shortness of breath chest pain headache nausea or vomiting or diarrhea. LBM 01/08/22   Objective:   CT ABDOMEN PELVIS WO CONTRAST  Result Date: 01/09/2022 CLINICAL DATA:  71 year old male with LEFT abdominal and pelvic pain. EXAM: CT ABDOMEN AND PELVIS WITHOUT CONTRAST TECHNIQUE: Multidetector CT imaging of the abdomen and pelvis was performed following the standard protocol without IV contrast. RADIATION DOSE REDUCTION: This exam was performed according to the departmental dose-optimization program which includes automated exposure control, adjustment of the mA and/or kV according to patient size and/or use of iterative reconstruction technique. COMPARISON:  01/08/2022 radiographs.  10/21/2017 PET CT FINDINGS: Please note that parenchymal and vascular abnormalities may be missed as intravenous contrast was not administered. Lower chest: Small bilateral pleural effusions, LEFT greater than RIGHT, noted with mild bibasilar atelectasis. Heavy coronary artery atherosclerotic calcifications are identified. Hepatobiliary: The liver and gallbladder are unremarkable. There is no evidence of intrahepatic or extrahepatic biliary dilatation. Pancreas: Unremarkable Spleen: Unremarkable Adrenals/Urinary Tract: The kidneys, adrenal glands and bladder are unremarkable except for mild RIGHT renal artery calcifications. Stomach/Bowel: A small bore feeding tube is present with the tip in the distal stomach. There is no evidence of bowel obstruction, definite bowel wall thickening or inflammatory changes. Colonic diverticulosis identified without evidence of acute  diverticulitis. Vascular/Lymphatic: Aortic atherosclerosis. No enlarged abdominal or pelvic lymph nodes. Reproductive: Prostate is unremarkable. Other: No ascites, focal collection or pneumoperitoneum. Musculoskeletal: There is mild to moderate enlargement and increased density of the RIGHT psoas muscle compatible with hemorrhage/hematoma. No acute or suspicious bony abnormalities are noted. IMPRESSION: 1. Mild to moderate enlargement and increased density of the RIGHT psoas muscle compatible with hemorrhage/hematoma. Infection/injury would be considered much less likely. Correlate clinically. 2. Small bilateral pleural effusions, LEFT greater than RIGHT, with mild bibasilar atelectasis. 3. Coronary artery disease. 4. Aortic Atherosclerosis (ICD10-I70.0). Electronically Signed   By: Harmon Pier M.D.   On: 01/09/2022 15:13   DG Chest 1 View  Result Date: 01/08/2022 CLINICAL DATA:  Shortness of breath. Evaluate left basilar consolidation, pleural effusion, pneumothorax. EXAM: CHEST  1 VIEW COMPARISON:  Portable chest yesterday at 5:34 p.m. FINDINGS: 4:54 a.m., 01/08/2022. There is mild cardiomegaly without evidence of CHF. Patchy hazy opacity in the left lung base is seen with improvement, with small left pleural effusion also improved versus redistributed. The remaining lungs are clear. The mediastinal configuration is stable with aortic atherosclerosis. There is no visible pneumothorax.  No acute osseous abnormality. Feeding tube enters the stomach with the intragastric course of the tube not filmed. IMPRESSION: 1. Improved opacities in the left base with left pleural effusion either smaller in size or redistributed today. 2. Vascular congestion is no longer seen. 3. No visible pneumothorax. 4. Aortic atherosclerosis. Electronically Signed   By: Almira Bar M.D.   On: 01/08/2022 06:27   DG Chest Port 1 View  Result Date: 01/07/2022 CLINICAL  DATA:  Acute left upper quadrant pain EXAM: PORTABLE CHEST 1 VIEW  COMPARISON:  12/31/2021 FINDINGS: Single frontal view of the chest demonstrates enteric catheter passing below diaphragm tip excluded by collimation. Interval development of left basilar consolidation and small left pleural effusion. Trace right pleural effusion also suspected. Increased central vascular congestion. No pneumothorax. No acute bony abnormalities. IMPRESSION: 1. Increased central vascular congestion with developing left basilar consolidation and bilateral effusions, most consistent with volume overload. Superimposed infection cannot be excluded. Electronically Signed   By: Sharlet SalinaMichael  Brown M.D.   On: 01/07/2022 17:59   DG Abd Portable 1V  Result Date: 01/08/2022 CLINICAL DATA:  Abdominal pain. EXAM: PORTABLE ABDOMEN - 1 VIEW COMPARISON:  Jan 06, 2022. FINDINGS: The bowel gas pattern is normal. Distal tip of feeding tube is seen in expected position of distal stomach. No radio-opaque calculi or other significant radiographic abnormality are seen. IMPRESSION: No abnormal bowel dilatation is noted. Electronically Signed   By: Lupita RaiderJames  Green Jr M.D.   On: 01/08/2022 10:28   Recent Labs    01/08/22 0537 01/09/22 0552  WBC 9.9 8.5  HGB 11.1* 11.1*  HCT 31.8* 32.5*  PLT 166 165   Recent Labs    01/08/22 0537  NA 133*  K 4.5  CL 101  CO2 23  GLUCOSE 259*  BUN 40*  CREATININE 1.55*  CALCIUM 8.5*    Intake/Output Summary (Last 24 hours) at 01/09/2022 1635 Last data filed at 01/09/2022 1312 Gross per 24 hour  Intake 1701.4 ml  Output 2030 ml  Net -328.6 ml        Physical Exam: Vital Signs Blood pressure 127/62, pulse 82, temperature 98.7 F (37.1 C), resp. rate 18, height 5\' 7"  (1.702 m), weight 76.9 kg, SpO2 96 %.   General: Alert and oriented x 3, No apparent distress HEENT: Head is normocephalic, atraumatic, PERRLA, EOMI, sclera anicteric, oral mucosa pink and moist, dentition intact, ext ear canals clear,  Neck: Supple without JVD or lymphadenopathy Heart: Reg rate and  rhythm. No murmurs rubs or gallops Chest: CTA bilaterally without wheezes, rales, or rhonchi; no distress Abdomen: Abdomen is distended however there is no rebound or guarding positive bowel sounds minimal tenderness LUQ Extremities: No clubbing, cyanosis, Pulses are 2+ Note: There is edema +1 RLE and +2 RUE edema distal to splint Psych: Pt's affect is appropriate. Pt is cooperative Skin: RUE wrapped in Kerlix but clean dry and intact Neuro: Cranial nerves II through XII intact, alert and oriented x3 mild dysarthria, follows simple commands.. Musculoskeletal: Able to move all extremities. motor strength is 5 out of 5 in left and 3 of 5 right deltoid, bicep, tricep, trace grip, hip flexor, knee extensors, ankle dorsiflexor and plantar flexor   Assessment/Plan: 1. Functional deficits which require 3+ hours per day of interdisciplinary therapy in a comprehensive inpatient rehab setting. Physiatrist is providing close team supervision and 24 hour management of active medical problems listed below. Physiatrist and rehab team continue to assess barriers to discharge/monitor patient progress toward functional and medical goals  Care Tool:  Bathing  Bathing activity did not occur: Refused Body parts bathed by patient: Face         Bathing assist Assist Level: Supervision/Verbal cueing     Upper Body Dressing/Undressing Upper body dressing   What is the patient wearing?: Hospital gown only    Upper body assist Assist Level: Total Assistance - Patient < 25%    Lower Body Dressing/Undressing Lower body dressing  What is the patient wearing?: Pants     Lower body assist Assist for lower body dressing: Total Assistance - Patient < 25%     Toileting Toileting    Toileting assist Assist for toileting: Total Assistance - Patient < 25%     Transfers Chair/bed transfer  Transfers assist     Chair/bed transfer assist level: Dependent - mechanical lift      Locomotion Ambulation   Ambulation assist   Ambulation activity did not occur: Safety/medical concerns          Walk 10 feet activity   Assist  Walk 10 feet activity did not occur: Safety/medical concerns        Walk 50 feet activity   Assist Walk 50 feet with 2 turns activity did not occur: Safety/medical concerns         Walk 150 feet activity   Assist Walk 150 feet activity did not occur: Safety/medical concerns         Walk 10 feet on uneven surface  activity   Assist Walk 10 feet on uneven surfaces activity did not occur: Safety/medical concerns         Wheelchair     Assist Is the patient using a wheelchair?: Yes Type of Wheelchair: Manual    Wheelchair assist level: Dependent - Patient 0% Max wheelchair distance: 150    Wheelchair 50 feet with 2 turns activity    Assist        Assist Level: Dependent - Patient 0%   Wheelchair 150 feet activity     Assist      Assist Level: Dependent - Patient 0%   Blood pressure 127/62, pulse 82, temperature 98.7 F (37.1 C), resp. rate 18, height 5\' 7"  (1.702 m), weight 76.9 kg, SpO2 96 %.   Medical Problem List and Plan: 1. 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  Antithrombotics: -DVT/anticoagulation:  Pharmaceutical: Heparin             5/27- R brachial DVT- placed on Heparin gtt with no bolus per Dr 6/27- pharmacy managing.              5/28- Hb stable-no signs of additonal stroke/bleeding so far.              -antiplatelet therapy: none 3. Pain Management: Tylenol as needed 4. Mood: LCSW to evaluate and provide emotional support             -antipsychotic agents: n/a 5.  Neuropsych: This patient is capable of making decisions on his own behalf. 6. Skin/Wound Care: Routine skin care checks 7. Fluids/Electrolytes/Nutrition: Routine Is and Os and follow-up chemistries             --dysphagia 1; honey thick- Has Cortrak in place and receiving TF's.  6/3 continue TF, Coretrak in place 8: T2DM: Hgb A1c- metformin on hold due to AKI/variable intake (puree diet). --Monitor BS ac/hs and use SSI for elevated BS. --Change TF to 6 pm-6 am  CBG (last 3)  Recent Labs    01/09/22 0609 01/09/22 1143 01/09/22 1639  GLUCAP 211* 143* 132*   6/1 Fair control, continue to hold metformin due to AKI/variable intake 6/3 still has fair control of CBG's, continue to trend 9: Hypertension: Off Lisinopril due to AKI/hyperkalemia  --continue amlodipine, atenolol, hydralazine Vitals:   01/09/22 0946 01/09/22 1442  BP:  127/62  Pulse:  82  Resp:  18  Temp:  98.7 F (37.1 C)  SpO2: 96% 96%   6/1 well controlled, continue to follow 6/3 continue amlodipine atenolol and hydralazine well-controlled 10:  COPD: Continue Anoro Ellipta daily.  Flutter valve qid. Smoked 2ppd - CXR OK  --Continue to taper prednisone. --Duoneb prn 5/27- SOB this AM- responded well to albuterol treatment given 5/28- added pulmicort nebs per Resp therapy- and also has albuterol scheduled 11: Alcohol abuse: cessation counseling. Off phenobarb. Check Thiamine level.  --continue B12, thiamine and folic acid 12: Tobacco abuse: cessation counseling --continue nicotine patch 13: Hyperlipidemia: LDL- 192 and Trig- 499-->continue Crestor 40 mg 14: AKI may bhave CKD : Baseline SCr-?~1.36 --Encourage --drinking honey liquids but not touching puree's.              --continue water flushes qid via cortak -Creat bumped up to 1.97- cont H2O flushes q 4 Na+ 134, hesitate increasing this , alb a little low but doubt significant 3rd spacing -Cr 1.62 yesterday, Na 131, will recheck tomorrow 6/3 Crt 1.55, Na  133, continue to trend with qMonday labs    Latest Ref Rng & Units 01/08/2022    5:37 AM 01/06/2022    5:08 AM 01/04/2022    5:54 AM  BMP  Glucose 70 - 99 mg/dL 295   284   132    BUN 8 - 23 mg/dL 40   47   61    Creatinine 0.61 - 1.24 mg/dL 4.40   1.02   7.25    Sodium 135 - 145 mmol/L 133   131   134    Potassium 3.5 - 5.1 mmol/L 4.5   3.9   4.6    Chloride 98 - 111 mmol/L 101   100   103    CO2 22 - 32 mmol/L Calcium 8.9 - 10.3 mg/dL 8.5   8.6   8.6      15. Vitamin C deficiency: On daily supplement 16. Vitamin D deficiency: Continue weekly supplement.   17. 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 RUE swelling/weeping             Brachial DVT + weakness from CVA with dependant edema , will remove wrist splint and do retrograde massage per OT  RLE also with edema but doppler neg, EF if normal , no LLE swelling , likely related to hemiplegia  19.  Abd distension good bowel sound , dulc and SSE minimally effective, sorbitol plus SMOG enema was helpful , recheck KUB as pt still distended, may be gas - KUB stable 6/3 CT abdomen/pelvis, done with oral contrast,.  Consulted with Delle Reining by secure chat with recommendation for oral contrast per Coretrak give. Let Dr. Carlis Abbott, MD know that these recommendations were provided. -CT impression: Mild to moderate enlargement and increased density of the RIGHT psoas muscle compatible  with hemorrhage/hematoma. Infection/injury would be considered much less likely. Correlate clinically. Paged General Surgery and spoke with Dr. Sheliah Hatch, MD.  Recommendations to stop heparin gtt but no other interventions recommended.  Notified RN to stop heparin gtt. 20.  Urinary retention requiring I/O cath , add flomax 6/3 Continue Flomax  LOS: 9 days A FACE TO FACE  EVALUATION WAS PERFORMED  Tressia Miners 01/09/2022, 4:35 PM

## 2022-01-10 LAB — CBC
HCT: 31 % — ABNORMAL LOW (ref 39.0–52.0)
Hemoglobin: 10.6 g/dL — ABNORMAL LOW (ref 13.0–17.0)
MCH: 30.3 pg (ref 26.0–34.0)
MCHC: 34.2 g/dL (ref 30.0–36.0)
MCV: 88.6 fL (ref 80.0–100.0)
Platelets: 174 10*3/uL (ref 150–400)
RBC: 3.5 MIL/uL — ABNORMAL LOW (ref 4.22–5.81)
RDW: 12.6 % (ref 11.5–15.5)
WBC: 8 10*3/uL (ref 4.0–10.5)
nRBC: 0 % (ref 0.0–0.2)

## 2022-01-10 LAB — GLUCOSE, CAPILLARY
Glucose-Capillary: 218 mg/dL — ABNORMAL HIGH (ref 70–99)
Glucose-Capillary: 149 mg/dL — ABNORMAL HIGH (ref 70–99)
Glucose-Capillary: 126 mg/dL — ABNORMAL HIGH (ref 70–99)
Glucose-Capillary: 148 mg/dL — ABNORMAL HIGH (ref 70–99)

## 2022-01-10 NOTE — Progress Notes (Signed)
Physical Therapy Session Note  Patient Details  Name: Ronnie Jones MRN: [id number] Date of Birth: 17-Nov-1950  Today's Date: 01/10/2022 PT Individual Time: [id number] PT Individual Time Calculation (min): 55 min   Short Term Goals: Week 2:  PT Short Term Goal 1 (Week 2): Pt will transfer with mod assist consistently without lift PT Short Term Goal 2 (Week 2): Pt wil will ambulate 5ft with LRAD and max assist of 1 PT Short Term Goal 3 (Week 2): Pt will propell WC 129ft with min assist  Skilled Therapeutic Interventions/Progress Updates:    Pt received seated in bed, agreeable to PT session. No complaints of pain. Seated in bed to sitting EOB with assist x 2 needed for LE management and trunk elevation. Assisted pt with donning pants, socks, and shoes while seated EOB. Pt requires min A for sitting balance EOB due to pushing with LUE and R lateral lean. Sit to stand with max A to stedy with increase in R lateral lean noted. Pt is dependent to pull pants up over hips in standing. Stedy transfer bed to w/c with assist x 2 needed due to significant pushing to the R. Dependent transport via [place] chair to/from therapy gym. Squat pivot transfer TIS chair to/from mat table with max A x 2. Session focus on sitting balance EOM with min to mod A for sitting balance while reaching for targets outside BOS and across midline. Pt tends to sit in flexed trunk position and has increase in R lateral lean with onset of fatigue. Utilized [employer], engineering for [employer] as well as verbal and tactile cueing to correct posture. Pt fatigues very quickly with task. Once returned to [place],The chair pt able to perform L lateral leans onto mat table with min A for trunk control, 2 x 10 reps with focus on stretching out R lateral trunk and increasing comfort leaning to L side. Seated anterior leans/mini-crunches 2 x 10 reps with no UE support and min A for trunk control, cues to increase anterior lean. Pt again fatigues quickly with this task  and requires rest breaks during session to recover. Pt agreeable to remain seated in TIS chair at end of session, needs in reach, quick release belt and chair alarm in place, wife present.  Therapy Documentation Precautions:  Precautions Precautions: Fall Precaution Comments: dense R hemiparesis Restrictions Weight Bearing Restrictions: No       Therapy/Group: Individual Therapy   Excell Seltzer, PT, DPT, CSRS 01/10/2022, 1:59 PM

## 2022-01-10 NOTE — Progress Notes (Signed)
PROGRESS NOTE   Subjective/Complaints: Patient seen at bedside today.  Patient has 8/10 abdominal pain in LUQ and LLQ with guarding.  Issues with breathing difficulty last night have resolved and is not reporting issues breathing today.   ROS: Patient positive for abdominal distention, abdominal pain with guarding and right lower leg edema and right upper extremity edema distal to splint no shortness of breath chest pain headache nausea or vomiting or diarrhea. LBM 01/08/22   Objective:   CT ABDOMEN PELVIS WO CONTRAST  Result Date: 01/09/2022 CLINICAL DATA:  71 year old male with LEFT abdominal and pelvic pain. EXAM: CT ABDOMEN AND PELVIS WITHOUT CONTRAST TECHNIQUE: Multidetector CT imaging of the abdomen and pelvis was performed following the standard protocol without IV contrast. RADIATION DOSE REDUCTION: This exam was performed according to the departmental dose-optimization program which includes automated exposure control, adjustment of the mA and/or kV according to patient size and/or use of iterative reconstruction technique. COMPARISON:  01/08/2022 radiographs.  10/21/2017 PET CT FINDINGS: Please note that parenchymal and vascular abnormalities may be missed as intravenous contrast was not administered. Lower chest: Small bilateral pleural effusions, LEFT greater than RIGHT, noted with mild bibasilar atelectasis. Heavy coronary artery atherosclerotic calcifications are identified. Hepatobiliary: The liver and gallbladder are unremarkable. There is no evidence of intrahepatic or extrahepatic biliary dilatation. Pancreas: Unremarkable Spleen: Unremarkable Adrenals/Urinary Tract: The kidneys, adrenal glands and bladder are unremarkable except for mild RIGHT renal artery calcifications. Stomach/Bowel: A small bore feeding tube is present with the tip in the distal stomach. There is no evidence of bowel obstruction, definite bowel wall thickening  or inflammatory changes. Colonic diverticulosis identified without evidence of acute diverticulitis. Vascular/Lymphatic: Aortic atherosclerosis. No enlarged abdominal or pelvic lymph nodes. Reproductive: Prostate is unremarkable. Other: No ascites, focal collection or pneumoperitoneum. Musculoskeletal: There is mild to moderate enlargement and increased density of the RIGHT psoas muscle compatible with hemorrhage/hematoma. No acute or suspicious bony abnormalities are noted. IMPRESSION: 1. Mild to moderate enlargement and increased density of the RIGHT psoas muscle compatible with hemorrhage/hematoma. Infection/injury would be considered much less likely. Correlate clinically. 2. Small bilateral pleural effusions, LEFT greater than RIGHT, with mild bibasilar atelectasis. 3. Coronary artery disease. 4. Aortic Atherosclerosis (ICD10-I70.0). Electronically Signed   By: Harmon Pier M.D.   On: 01/09/2022 15:13   DG CHEST PORT 1 VIEW  Result Date: 01/09/2022 CLINICAL DATA:  [id number]. Reason for exam: dyspnea Principle problem: intraparenchymal hemorrhage of brain EXAM: PORTABLE CHEST 1 VIEW.  Patient is rotated. COMPARISON:  Chest x-ray 01/08/2022, CT chest 05/29/2018 FINDINGS: Enteric tube coursing below the hemidiaphragm with tip collimated off view. The heart and mediastinal contours are unchanged. Aortic calcification. Bilateral costophrenic angles collimated off view. No focal consolidation. No pulmonary edema. Known bilateral trace pleural effusions not well visualized. No pneumothorax. No acute osseous abnormality. IMPRESSION: 1. Please note limited evaluation due to bilateral costophrenic angles collimated off view. Known bilateral trace pleural effusions not well visualized. Please see separately dictated CT abdomen pelvis 01/09/2022. 2. Otherwise no other acute cardiopulmonary abnormality. 3.  Aortic Atherosclerosis (ICD10-I70.0). Electronically Signed   By: Tish Frederickson M.D.   On: 01/09/2022 19:16     Recent Labs  01/09/22 0552 01/10/22 0555  WBC 8.5 8.0  HGB 11.1* 10.6*  HCT 32.5* 31.0*  PLT 165 174   Recent Labs    01/08/22 0537  NA 133*  K 4.5  CL 101  CO2 23  GLUCOSE 259*  BUN 40*  CREATININE 1.55*  CALCIUM 8.5*    Intake/Output Summary (Last 24 hours) at 01/10/2022 1243 Last data filed at 01/10/2022 1129 Gross per 24 hour  Intake 3144.14 ml  Output 3250 ml  Net -105.86 ml        Physical Exam: Vital Signs Blood pressure 124/62, pulse 86, temperature 98.6 F (37 C), resp. rate 17, height  (1.702 m), weight 74.7 kg, SpO2 95 %.   General: Alert and oriented x 3, No apparent distress HEENT: Head is normocephalic, atraumatic, PERRLA, EOMI, sclera anicteric, oral mucosa pink and moist, dentition intact, ext ear canals clear,  Neck: Supple without JVD or lymphadenopathy Heart: Reg rate and rhythm. No murmurs rubs or gallops Chest: Some scattered expiratory wheezes and rhonchi mostly upper lobes this morning.  Mostly clears with coughing.  No distress. On RA, O2 sats. >96 %. Abdomen: Abdomen is distended. Guarding and pain of 8/10 LUQ and LLQ. Pain and tenderness with palpation. Positive bowel sounds. Extremities: No clubbing, cyanosis, Pulses are 2+ Note: There is edema +1 RLE and +2 RUE edema distal to splint Psych: Pt's affect is appropriate. Pt is cooperative Skin: RUE wrapped in Kerlix but clean dry and intact Neuro: Cranial nerves II through XII intact, alert and oriented x3 mild dysarthria, follows simple commands.. Musculoskeletal: Able to move all extremities. motor strength is 5 out of 5 in left and 3 of 5 right deltoid, bicep, tricep, trace grip, hip flexor, knee extensors, ankle dorsiflexor and plantar flexor   Assessment/Plan: 1. Functional deficits which require 3+ hours per day of interdisciplinary therapy in a comprehensive inpatient rehab setting. Physiatrist is providing close team supervision and 24 hour management of active medical  problems listed below. Physiatrist and rehab team continue to assess barriers to discharge/monitor patient progress toward functional and medical goals  Care Tool:  Bathing  Bathing activity did not occur: Refused Body parts bathed by patient: Face         Bathing assist Assist Level: Supervision/Verbal cueing     Upper Body Dressing/Undressing Upper body dressing   What is the patient wearing?: Hospital gown only    Upper body assist Assist Level: Total Assistance - Patient < 25%    Lower Body Dressing/Undressing Lower body dressing      What is the patient wearing?: Pants     Lower body assist Assist for lower body dressing: Total Assistance - Patient < 25%     Toileting Toileting    Toileting assist Assist for toileting: Total Assistance - Patient < 25%     Transfers Chair/bed transfer  Transfers assist     Chair/bed transfer assist level: Dependent - mechanical lift     Locomotion Ambulation   Ambulation assist   Ambulation activity did not occur: Safety/medical concerns          Walk 10 feet activity   Assist  Walk 10 feet activity did not occur: Safety/medical concerns        Walk 50 feet activity   Assist Walk 50 feet with 2 turns activity did not occur: Safety/medical concerns         Walk 150 feet activity   Assist Walk 150 feet activity did not occur: Safety/medical concerns  Walk 10 feet on uneven surface  activity   Assist Walk 10 feet on uneven surfaces activity did not occur: Safety/medical concerns         Wheelchair     Assist Is the patient using a wheelchair?: Yes Type of Wheelchair: Manual    Wheelchair assist level: Dependent - Patient 0% Max wheelchair distance: 150    Wheelchair 50 feet with 2 turns activity    Assist        Assist Level: Dependent - Patient 0%   Wheelchair 150 feet activity     Assist      Assist Level: Dependent - Patient 0%   Blood pressure  124/62, pulse 86, temperature 98.6 F (37 C), resp. rate 17, height  (1.702 m), weight 74.7 kg, SpO2 95 %.   Medical Problem List and Plan: 1. 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  Antithrombotics: -DVT/anticoagulation:  Pharmaceutical: Heparin             5/27- R brachial DVT- placed on Heparin gtt with no bolus per Dr Pearlean Brownie- pharmacy managing.              5/28- Hb stable-no signs of additonal stroke/bleeding so far.              -antiplatelet therapy: none 3. Pain Management: Tylenol as needed 4. Mood: LCSW to evaluate and provide emotional support             -antipsychotic agents: n/a 5. Neuropsych: This patient is capable of making decisions on his own behalf. 6. Skin/Wound Care: Routine skin care checks 7. Fluids/Electrolytes/Nutrition: Routine Is and Os and follow-up chemistries             --dysphagia 1; honey thick- Has Cortrak in place and receiving TF's.  6/4 continue TF, Coretrak in place 8: T2DM: Hgb A1c- metformin on hold due to AKI/variable intake (puree diet). --Monitor BS ac/hs and use SSI for elevated BS. --Change TF to 6 pm-6 am CBG (last 3)  Recent Labs    01/09/22 2050 01/10/22 0541 01/10/22 1149  GLUCAP 158* 218* 149*   6/1 Fair control, continue to hold metformin due to AKI/variable intake 6/4 still has fair control of CBG's, continue to trend 9: Hypertension: Off Lisinopril due to AKI/hyperkalemia  --continue amlodipine, atenolol, hydralazine Vitals:   01/10/22 0358 01/10/22 0746  BP: 124/62   Pulse: 86   Resp: 17   Temp: 98.6 F (37 C)   SpO2: 96% 95%   6/1 well controlled, continue to follow 6/4 continue amlodipine atenolol and  hydralazine well-controlled 10:  COPD: Continue Anoro Ellipta daily.  Flutter valve qid. Smoked 2ppd - CXR OK  --Continue to taper prednisone. --Duoneb prn 5/27- SOB this AM- responded well to albuterol treatment given 5/28- added pulmicort nebs per Resp therapy- and also has albuterol scheduled 6/4 Towards the end of day shift on 01/09/22, pt reported being short of breath with need for prn nebs. Oxygen sats. were >96 %  but pt appeared uncomfortable.  Per nurse, increase wheezing and rhonchi to auscultation.   CT abd/pelvis done earlier in the day on 6/3 demonstrated: Small bilateral pleural effusions, LEFT greater than RIGHT, with mild bibasilar atelectasis. -with nurse reports of SOB on 6/3, CXR was ordered which showed: limited evaluation due to bilateral costophrenic angles collimated off view. Known bilateral trace pleural effusions not well visualized. Please see separately dictated CT abdomen pelvis 01/09/2022.  Today patient still had expiratory wheezes and scattered rhonchi but no distress. Continue albuterol prn and monitoring saturations. 11: Alcohol abuse: cessation counseling. Off phenobarb. Check Thiamine level.  --continue B12, thiamine and folic acid 12: Tobacco abuse: cessation counseling --continue nicotine patch 13: Hyperlipidemia: LDL- 192 and Trig- 499-->continue Crestor 40 mg 14: AKI may bhave CKD : Baseline SCr-?~1.36 --Encourage --drinking honey liquids but not touching puree's.              --continue water flushes qid via cortak -Creat bumped up to 1.97- cont H2O flushes 200ml q 4 Na+ 134, hesitate increasing this , alb a little low but doubt significant 3rd spacing -Cr 1.62 yesterday, Na 131, will recheck tomorrow 6/4 Crt 1.55, Na 133, continue to trend with qMonday labs    Latest Ref Rng & Units 01/08/2022    5:37 AM 01/06/2022    5:08 AM 01/04/2022    5:54 AM  BMP  Glucose 70 - 99 mg/dL 161259   [id number]   [id number]    BUN 8 - 23 mg/dL 40   47   61    Creatinine 0.61 - 1.24  mg/dL 4.091.55   8.111.62   9.141.97    Sodium 135 - 145 mmol/L 133   131   134    Potassium 3.5 - 5.1 mmol/L 4.5   3.9   4.6    Chloride 98 - 111 mmol/L 101   100   103    CO2 22 - 32 mmol/L 23   24   21     Calcium 8.9 - 10.3 mg/dL 8.5   8.6   8.6      15. Vitamin C deficiency: On daily supplement 16. Vitamin D deficiency: Continue weekly supplement.   17. 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 RUE swelling/weeping             Brachial DVT + weakness from CVA with dependant edema , will remove wrist splint and do retrograde massage per OT  RLE also with edema but doppler neg, EF if normal , no LLE swelling , likely related to hemiplegia  19.  Abd distension good bowel sound , dulc and SSE minimally effective, sorbitol plus SMOG enema was helpful , recheck KUB as pt still distended, may be gas - KUB stable 6/3 CT abdomen/pelvis, done with oral contrast.  Consulted with Delle ReiningPamela Love by secure chat with recommendation for oral contrast per Coretrak give. Let Dr. Carlis Abbottaulkar, MD know that these recommendations were provided. -CT impression: Mild to moderate enlargement and increased density of the RIGHT psoas muscle compatible with hemorrhage/hematoma. Infection/injury would be considered much less likely. Correlate clinically. Paged General Surgery and spoke with Dr. Sheliah HatchKinsinger, MD.  Recommendations to stop heparin gtt but no other interventions recommended.  Notified RN to stop heparin gtt. 6/4 Discussed the question of restarting heparin gtt for brachial DVT with pharmacist  Arnette Felts, [place] and Dr. Carlis Abbott, MD. -Patient today has abdominal pain 8/10 LUQ and LLQ with guarding.  Per Dr. Carlis Abbott, MD the risk of further abdominal bleeding is greater than DVT risk while significant pain is still present.  However, she encouraged a discussion  of risks/benefits with wife and patient.  Both wife and patient wish to wait until abdominal pain is improved before restarting heparin.  Communicated discussion with Dr. Carlis Abbott, MD and Sula Soda [place], [place].  Pharmacist to report off to weekday pharmacist to continue to follow-up.  Primary to consider when to restart heparin gtt.   20.  Urinary retention requiring I/O cath, add flomax 6/4 Continue Flomax  LOS: 10 days A FACE TO FACE EVALUATION WAS PERFORMED  Tressia Miners 01/10/2022, 12:43 PM

## 2022-01-10 NOTE — Progress Notes (Addendum)
CT abdomen 01/09/22 showed hematoma/hemorrhage.  Based on General Surgery recommendation and consulting with Dr. Carlis Abbott, MD heparin gtt was stopped yesterday.  However, pt also has Rt brachial DVT.  Patient still has abdominal pain of 8/10 today with guarding especially in LUQ and LLQ.  Discussed with pharmacist Arnette Felts, [facility] and Dr. Carlis Abbott, MD about restarting the heparin gtt.  Per Dr. Carlis Abbott, MD the abdominal bleed is a higher risk than the DVT risk while significant abdominal pain is still present.  Dr. Carlis Abbott, MD suggested since the patient is capable of decision making to discuss risk and benefits of heparin gtt with patient and wife.  This provider discussed the risk of worsening of abdominal bleeding with the heparin gtt in the setting of a rt brachial DVT both patient and wife.  Both patient and wife at this time would like to wait for improvement in abdominal pain before resuming heparin gtt.  They also said they will further consider restarting heparin with improvement in abdominal pain and hemorrhage.  Communicated this conversation to both Dr. Carlis Abbott, MD and pharmacist Sula Soda [place], [place]. Pharmacy will continue to follow.  Primary team to further evaluate.

## 2022-01-10 NOTE — Progress Notes (Signed)
This nurse attempted to place ortho devices on RUE and RLE. Pt refused. Pt educated on the importance of wearing the ordered devices; but continued to refuse.

## 2022-01-11 ENCOUNTER — Inpatient Hospital Stay (HOSPITAL_COMMUNITY): Payer: Medicare HMO

## 2022-01-11 DIAGNOSIS — I82401 Acute embolism and thrombosis of unspecified deep veins of right lower extremity: Secondary | ICD-10-CM

## 2022-01-11 LAB — CBC
HCT: 33.1 % — ABNORMAL LOW (ref 39.0–52.0)
Hemoglobin: 11.4 g/dL — ABNORMAL LOW (ref 13.0–17.0)
MCH: 30.2 pg (ref 26.0–34.0)
MCHC: 34.4 g/dL (ref 30.0–36.0)
MCV: 87.8 fL (ref 80.0–100.0)
Platelets: 181 10*3/uL (ref 150–400)
RBC: 3.77 MIL/uL — ABNORMAL LOW (ref 4.22–5.81)
RDW: 12.6 % (ref 11.5–15.5)
WBC: 10.6 10*3/uL — ABNORMAL HIGH (ref 4.0–10.5)
nRBC: 0 % (ref 0.0–0.2)

## 2022-01-11 LAB — BASIC METABOLIC PANEL
Anion gap: 9 (ref 5–15)
BUN: 34 mg/dL — ABNORMAL HIGH (ref 8–23)
CO2: 23 mmol/L (ref 22–32)
Calcium: 8.8 mg/dL — ABNORMAL LOW (ref 8.9–10.3)
Chloride: 97 mmol/L — ABNORMAL LOW (ref 98–111)
Creatinine, Ser: 1.4 mg/dL — ABNORMAL HIGH (ref 0.61–1.24)
GFR, Estimated: 55 mL/min — ABNORMAL LOW (ref >60)
Glucose, Bld: 172 mg/dL — ABNORMAL HIGH (ref 70–99)
Potassium: 5.5 mmol/L — ABNORMAL HIGH (ref 3.5–5.1)
Sodium: 129 mmol/L — ABNORMAL LOW (ref 135–145)

## 2022-01-11 LAB — GLUCOSE, CAPILLARY
Glucose-Capillary: 168 mg/dL — ABNORMAL HIGH (ref 70–99)
Glucose-Capillary: 147 mg/dL — ABNORMAL HIGH (ref 70–99)
Glucose-Capillary: 127 mg/dL — ABNORMAL HIGH (ref 70–99)
Glucose-Capillary: 145 mg/dL — ABNORMAL HIGH (ref 70–99)

## 2022-01-11 IMAGING — CR DG CHEST 2V
2 series · 2 of 2 positions shown · non-contrast
Comparison: AP chest [DATE], [DATE], [DATE]; chest two
views [DATE]; CT chest [DATE]; CT abdomen and pelvis
[DATE]

CLINICAL DATA: Shortness of breath.

EXAM:
CHEST - 2 VIEW

[chest lat]
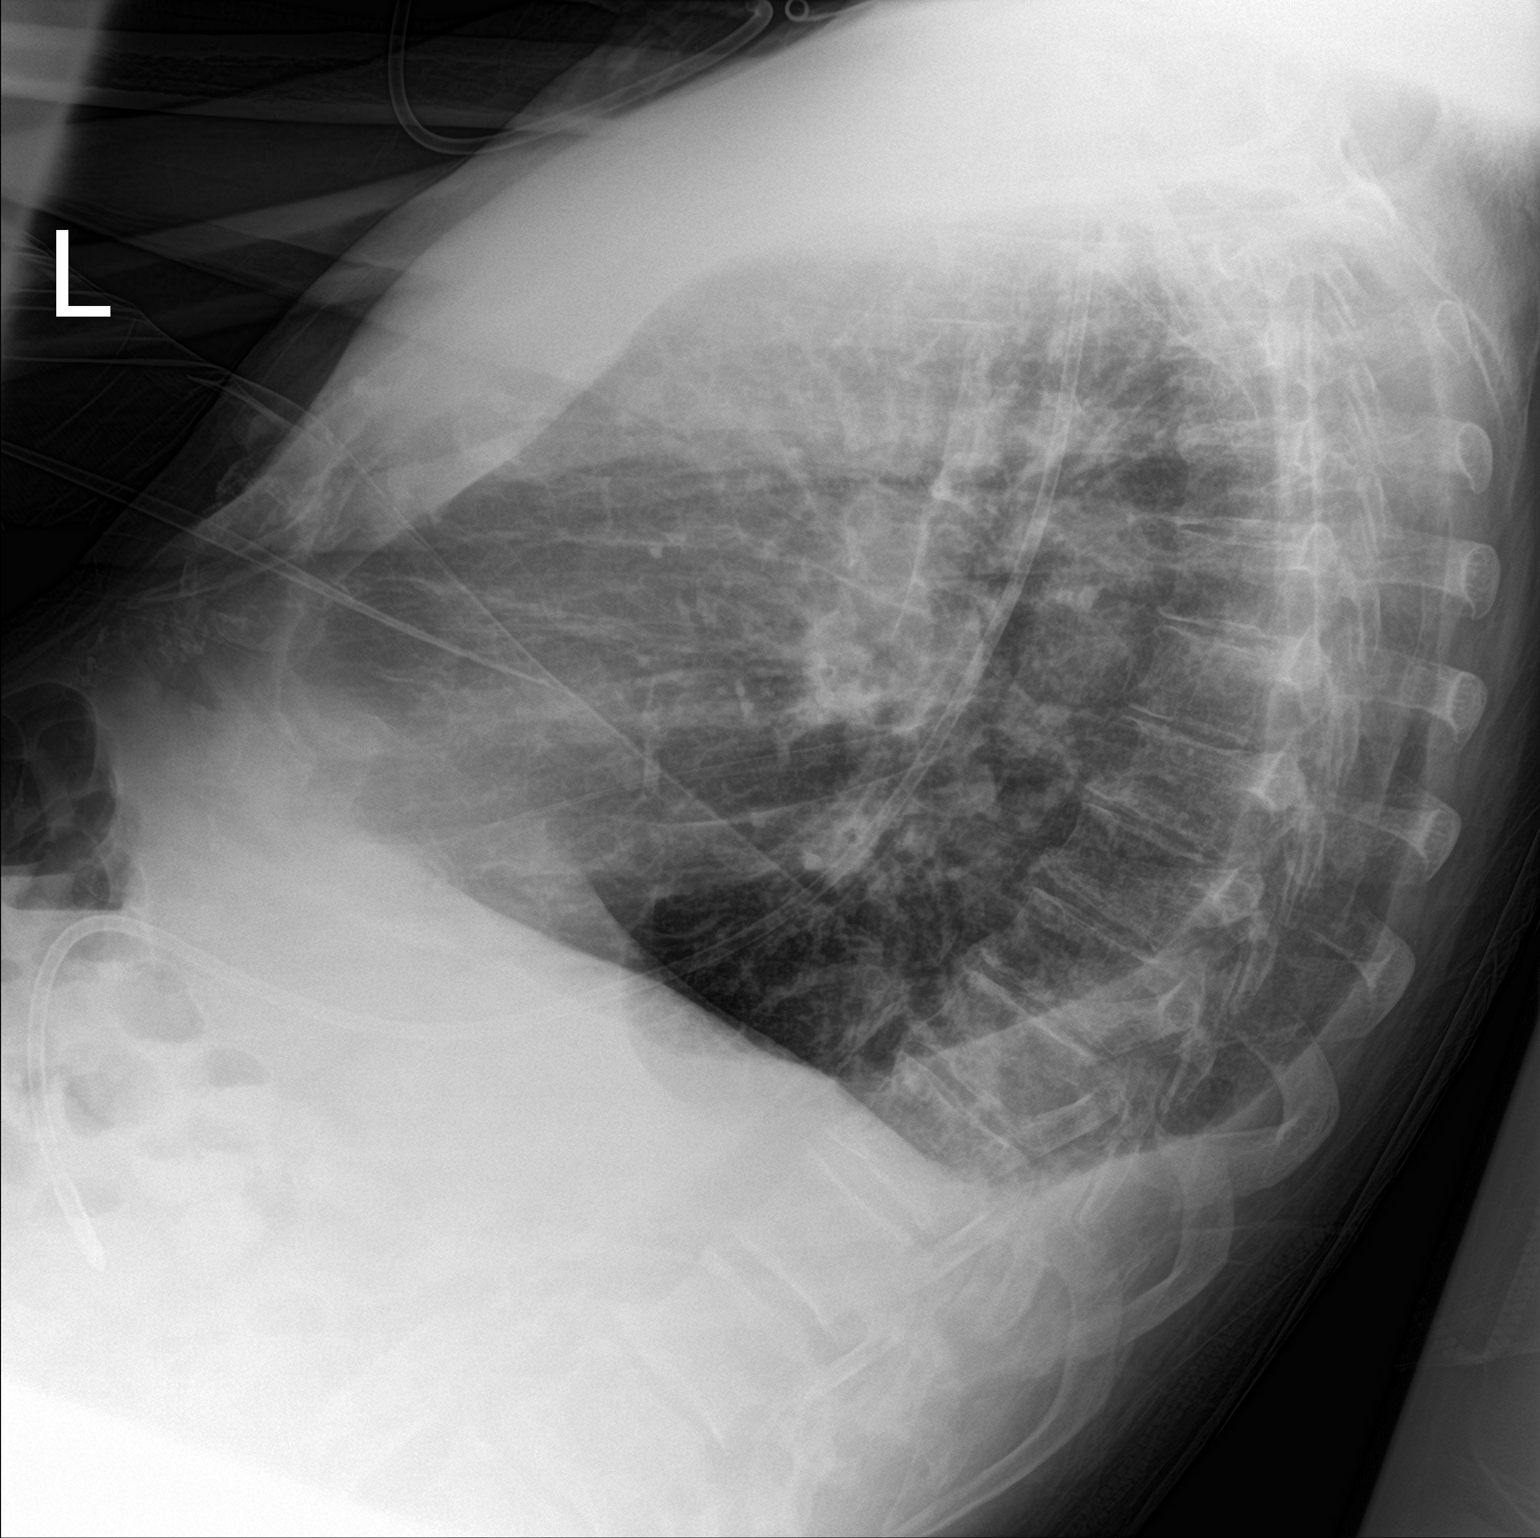

[chest ap]
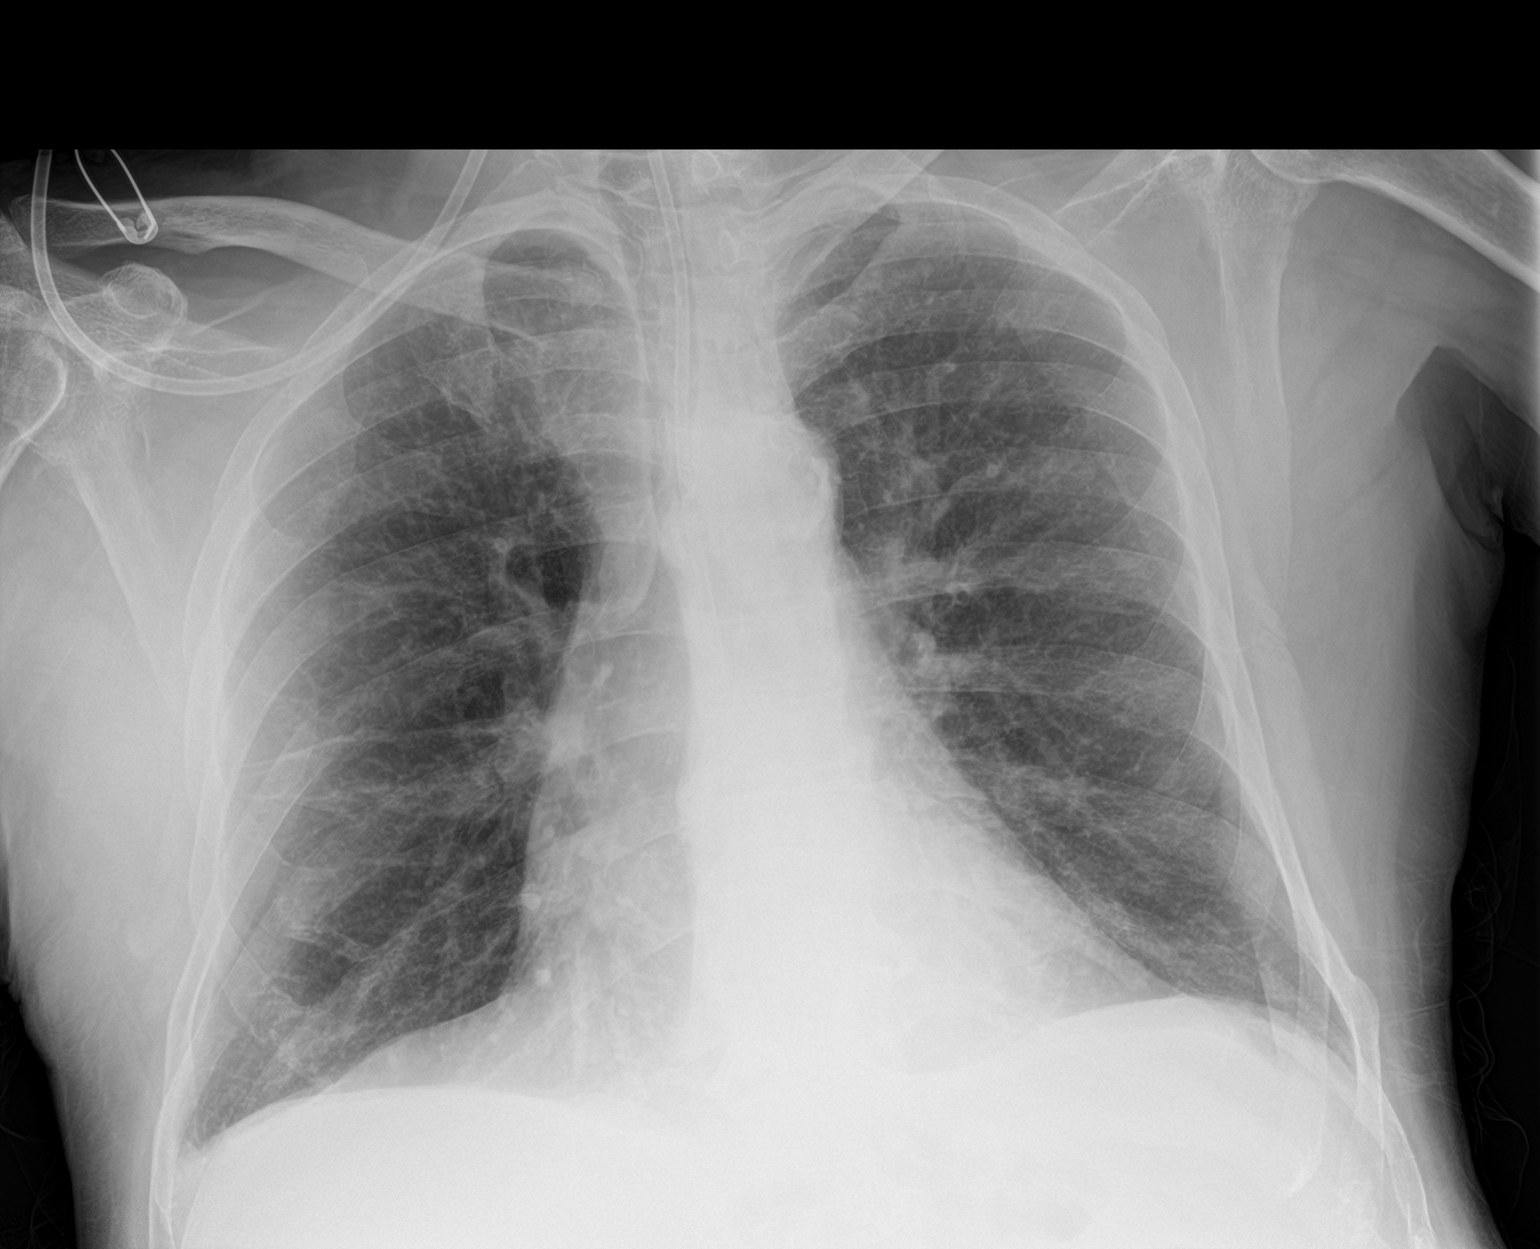

[2 of 2 positions shown; findings below may reference images not displayed]

FINDINGS: Cardiac silhouette is again mildly enlarged. Moderate calcifications
are again seen within the aortic arch. Enteric tube descends below
the diaphragm with the tip excluded by collimation.

Improved aeration of the left lung base compared to most recent
[DATE] frontal radiograph.

Minimal blunting of the posterior costophrenic angle on lateral view
likely relates to the small left-greater-than-right pleural
effusions previously seen on [DATE] CT abdomen and pelvis. No
significant airspace consolidation is seen. No pneumothorax.

Mild-to-moderate multilevel degenerative disc changes of the
thoracic spine.
IMPRESSION: 1. Improved aeration of the lung bases compared to [DATE]
frontal radiograph.
2. Trace pleural effusions on lateral view. No significant lung
consolidation.

## 2022-01-11 MED ORDER — DOXAZOSIN MESYLATE 1 MG PO TABS
1.0000 mg | ORAL_TABLET | Freq: Every day | ORAL | Status: DC
  Administered 2022-01-11 – 2022-01-20 (×9): 1 mg via ORAL
  Filled 2022-01-11 (×10): qty 1

## 2022-01-11 MED ORDER — FREE WATER
200.0000 mL | Freq: Four times a day (QID) | Status: DC
  Administered 2022-01-11 – 2022-01-18 (×26): 200 mL

## 2022-01-11 MED ORDER — LIDOCAINE HCL URETHRAL/MUCOSAL 2 % EX GEL
CUTANEOUS | Status: DC | PRN
  Administered 2022-01-16 – 2022-01-17 (×2): 6 via TOPICAL
  Filled 2022-01-11 (×5): qty 6

## 2022-01-11 MED ORDER — NEPRO/CARBSTEADY PO LIQD
1000.0000 mL | ORAL | Status: DC
  Administered 2022-01-11: 1000 mL
  Filled 2022-01-11 (×2): qty 1000

## 2022-01-11 MED ORDER — ACETAMINOPHEN 325 MG PO TABS
650.0000 mg | ORAL_TABLET | Freq: Three times a day (TID) | ORAL | Status: DC
  Administered 2022-01-11 – 2022-01-19 (×31): 650 mg
  Filled 2022-01-11 (×31): qty 2

## 2022-01-11 MED ORDER — OSMOLITE 1.5 CAL PO LIQD: 1000.0000 mL | ORAL | Status: DC

## 2022-01-11 MED ORDER — AMLODIPINE BESYLATE 5 MG PO TABS
5.0000 mg | ORAL_TABLET | Freq: Every day | ORAL | Status: DC
  Administered 2022-01-12: 5 mg via ORAL
  Filled 2022-01-11: qty 1

## 2022-01-11 MED ORDER — MIRTAZAPINE 15 MG PO TBDP
15.0000 mg | ORAL_TABLET | Freq: Every day | ORAL | Status: DC
  Administered 2022-01-11 – 2022-01-12 (×2): 15 mg via ORAL
  Filled 2022-01-11 (×2): qty 1

## 2022-01-11 NOTE — Progress Notes (Signed)
Occupational Therapy Session Note  Patient Details  Name: Ronnie Jones MRN: [id number] Date of Birth: 1950/11/15  Today's Date: 01/11/2022 OT Individual Time: 1500-1600 OT Individual Time Calculation (min): 60 min    Short Term Goals: Week 1:  OT Short Term Goal 1 (Week 1): Pt will report for 2 consecutive sessions < 3/10 pain OT Short Term Goal 1 - Progress (Week 1): Met OT Short Term Goal 2 (Week 1): Pt will sit EOB at least 3 mins during ADL task to promote OOB tolerance OT Short Term Goal 2 - Progress (Week 1): Met OT Short Term Goal 3 (Week 1): Pt will complete 1 step of dressing task at bed level with no more than min verbal cues OT Short Term Goal 3 - Progress (Week 1): Met Week 2:  OT Short Term Goal 1 (Week 2): Pt will don shirt with mod A. OT Short Term Goal 2 (Week 2): Pt will don pants with mod A. OT Short Term Goal 3 (Week 2): Pt will complete >3 grooming tasks at sink with S.  Skilled Therapeutic Interventions/Progress Updates:    Pt received in TIS w/c.  For first 30 min, worked on PROM and prolonged stretching to RUE. Pt has significant tone in arm which also restricts his scapular mobility. Worked on table top slides with max A as pt not able to mobilize his arm well.  Rehab tech arrived for second half of session.  Focused on core control, midline awareness with use of stedy in front of mirror and then sitting EOB. Pt will lean heavily to the R but with cues (visual cues from mirror really helped) pt was able to self correct with min A.  From EOB worked on trunk rotation with reaching far across midline and out of BOS.  Pt had forced use of his trunk musculature to avoid LOB. Pt eventually progressed to static sit with supervision for several minutes.  Mod to move supine.  Worked on trunk rotation in supine with knee sways (pillow between knees and belt around thighs) and then hip bridges.   Pt would occasionally get out of breath and need short rest breaks.   Pt  resting in bed with all needs met and alarm set.   Therapy Documentation Precautions:  Precautions Precautions: Fall Precaution Comments: dense R hemiparesis Restrictions Weight Bearing Restrictions: No  Pain:  "I am sore all over" - pt resting in bed at end of session to rest    Therapy/Group: Individual Therapy  [place] 01/11/2022, 1:05 PM

## 2022-01-11 NOTE — Progress Notes (Signed)
Nutrition Follow-up  DOCUMENTATION CODES:   Not applicable  INTERVENTION:   Recommend long-term means for nutrition due to ongoing poor PO intake.  Tube Feeds via Cortrak: Recommend running Nepro @ 80 mL/hr x 12 hours Increase 45 mL ProSource TF to TID  Provides 1848 kcal, 11 gm protein, and 698 mL free water daily.   NUTRITION DIAGNOSIS:   Inadequate oral intake related to poor appetite, chronic illness, acute illness as evidenced by meal completion < 25%, per patient/family report. - Being addressed via TF and PO intake  GOAL:   Patient will meet greater than or equal to 90% of their needs - Being addressed via TF  MONITOR:   TF tolerance, PO intake, Supplement acceptance, Labs, Weight trends  REASON FOR ASSESSMENT:   New TF    ASSESSMENT:   71 yo male admitted to CIR after hospitalization for left thalamic/basal ganglia intracerebral hemorrhage of the brain with intraventricular extension. PMH includes COPD, HTN, EtOH abuse (1 pint of tequila daily), CKD, DM, vitamin deficiencies including Vit D and Vit C.  Spoke with pt wife at bedside, pt sleeping and did not wake to RD.  Wife reports that pt eat very little, </= 25% of his trays. She reports that she continues to try and encourage him to eat at meal times. Wife reports that pt typically will take a few bites of a Magic Cup each night. Discussed that pt may need long-term access for nutrition if PO intake continues to be poor. RD observed pt breakfast tray in room, untouched.    Per PA note from today, pt has refused to drink over the past 2-3 days and pt was started on Remeron. PA also changed TF to Nepro @ 80 mL/hr x 10 hours. - regimen provides 1440 kcal and 65 gm protein per night. This regimen is not adequate and would recommend increasing the duration that the tube feed is infused at.   Medications reviewed and include: Vitamin C, Folic acid, SSI, Protonix, Miralax, Senokot, Thiamine, Vitamin D Labs reviewed:  Sodium 129, Potassium 5.5, BUN 34, Creatinine 1.40  Diet Order:   Diet Order             DIET DYS 2 Room service appropriate? Yes; Fluid consistency: Honey Thick  Diet effective now                   EDUCATION NEEDS:   Not appropriate for education at this time  Skin:  Skin Assessment: Reviewed RN Assessment Skin Integrity Issues:: Other (Comment) Other: skin tear: vertebral column  Last BM:  6/5 - Type 7  Height:   Ht Readings from Last 1 Encounters:  12/31/21 5\' 7"  (1.702 m)    Weight:  Wt Readings from Last 1 Encounters:  01/11/22 72.6 kg   BMI:  Body mass index is 25.07 kg/m.  Estimated Nutritional Needs:  Kcal:  1900-2100 kcals Protein:  100-110 g Fluid:  >/1.9 L    03/13/22 RD, LDN Clinical Dietitian See [facility] for contact information.

## 2022-01-11 NOTE — Progress Notes (Signed)
Physical Therapy Session Note  Patient Details  Name: Ronnie Jones MRN: [id number] Date of Birth: 03/27/1951  Today's Date: 01/11/2022 PT Individual Time: 1030-1110 PT Individual Time Calculation (min): 40 min   Short Term Goals: Week 2:  PT Short Term Goal 1 (Week 2): Pt will transfer with mod assist consistently without lift PT Short Term Goal 2 (Week 2): Pt wil will ambulate 72ft with LRAD and max assist of 1 PT Short Term Goal 3 (Week 2): Pt will propell WC 126ft with min assist  Skilled Therapeutic Interventions/Progress Updates:    Handoff from OT with pt up in TIS recliner. Transported to gym via total assist. Engaged in NMR to address postural control retraining, reorientation to midline, decreasing pushing tendencies, and balance during transfers and functional activities EOM. Pt with poor carryover with multimodal cues for set up for squat pivot transfer to the R with attempting to have pt engage in head/hips relationship but continues to push to the R and limited anterior weightshift. Required max +2 for transfer and difficulty with carryover of technique. Seated EOM, pt complaining of fatigue throughout and requires several rest breaks. Worked on midline orientation, activation through core and trunk for balance, and forced WB through RUE for approximation requiring CGA up to max assist due to allowing himself to just lean posteriorly. Focused on functional reach activity for anterior and L weightshifting to grab item and relocate x several reps with breaks as needed. Transferred back to w/c with squat pivot with max +2 assistance again as described above. Returned to room and utilized Stedy for transfer back to bed to work on some NMR in standing and sit <> stands. Max+2 for initial sit > stand and difficulty again with anterior weightshift enough with strong lean to the R. Min assist from perched position but strong R lean noted and required second person to manage seated flaps.  Returned to bed with mod/max assist and pillows placed for positioning of RUE as well as to decreased lean to the L in the bed. Wife in room at end of session.   Therapy Documentation Precautions:  Precautions Precautions: Fall Precaution Comments: dense R hemiparesis Restrictions Weight Bearing Restrictions: No  Pain: Does not report pain , just complains of fatigue and feeling "terrible". Minimal verbalization on why he is "terrible".     Therapy/Group: Individual Therapy  Karolee Stamps Darrol Poke, PT, DPT, CBIS  01/11/2022, 12:04 PM

## 2022-01-11 NOTE — Progress Notes (Signed)
Occupational Therapy Session Note  Patient Details  Name: Ronnie Jones MRN: [id number] Date of Birth: 07/05/1951  Today's Date: 01/11/2022 OT Individual Time: 0930-1030 OT Individual Time Calculation (min): 60 min    Short Term Goals: Week 2:  OT Short Term Goal 1 (Week 2): Pt will don shirt with mod A. OT Short Term Goal 2 (Week 2): Pt will don pants with mod A. OT Short Term Goal 3 (Week 2): Pt will complete >3 grooming tasks at sink with S.  Skilled Therapeutic Interventions/Progress Updates:    S: Pt sleeping in bed. "I can't do it." (When OT discussed getting up, bathed, and dressed).    O: - Bed mobility: With HOB elevated, patient completed supine to sidelying to sitting on EOB with Total Assist. Required Max assist to maintain sitting balance as he demonstrated heavy right lateral lean with one LOB into HOB while seated. - Functional transfer: Patient completed squat pivot transfer with Max assist. Able to use assist with transitioning from sit to squat although demonstrated pusher syndrome tendencies and stopped helping half way through transfer. - UB bathing: Seated at sink; Mod Assist.  - LB bathing: completed with Max assist. Stedy used to assist with standing while performing peri care hygiene. - UB dressing: Completed while seated at sink with Max assist. Education provided on hemi dressing technique.  - LB dressing: Pt required total assist for LB dressing. Antony Salmon was used to assist with standing while pulling brief and pants over hips.  -Grooming: oral care completed with toothbrush and toothpaste with Min assist.O    A:Skilled OT services completed this AM with focus on increasing functional performance and participation in basic ADL tasks at wheelchair level. Bathing was completed seated in w/c at sink. Pt was encouraged to assist with bathing as much as possible. Occasional right neglect noticed during session although much more prominently seen during oral care.      P: Work on increasing sitting balance, body awareness.    Therapy Documentation Precautions:  Precautions Precautions: Fall Precaution Comments: dense R hemiparesis Restrictions Weight Bearing Restrictions: No  Pain:  No pain noted during session   Therapy/Group: Individual Therapy  Limmie [name], OTR/L,CBIS  Supplemental OT - [facility]  01/11/2022, 4:02 PM

## 2022-01-11 NOTE — Progress Notes (Signed)
Patient appears depressed--has been refusing to drink for past 2-3 days per wife. Refused when offered multiple choices. Had not touched his lunch tray--meat, fruit and macaroni/cheese appeared pureed. Nurse also reports fatigue and sleeping during the day with reports of not sleeping at night as well as some blood with I/O caths. Discussed with Dr. Wynn Banker added Remeron and Dr. Kieth Brightly consulted for mood and Cardura added for BPH. Is on multiple meds for BP. Will decrease Norvasc to 5 mg.

## 2022-01-11 NOTE — Progress Notes (Signed)
PROGRESS NOTE   Subjective/Complaints:  No abd pain , no arm pain ,  No SOB, did not sleep well but could not say why  ROS: limited by cognition   Objective:   CT ABDOMEN PELVIS WO CONTRAST  Result Date: 01/09/2022 CLINICAL DATA:  71 year old male with LEFT abdominal and pelvic pain. EXAM: CT ABDOMEN AND PELVIS WITHOUT CONTRAST TECHNIQUE: Multidetector CT imaging of the abdomen and pelvis was performed following the standard protocol without IV contrast. RADIATION DOSE REDUCTION: This exam was performed according to the departmental dose-optimization program which includes automated exposure control, adjustment of the mA and/or kV according to patient size and/or use of iterative reconstruction technique. COMPARISON:  01/08/2022 radiographs.  10/21/2017 PET CT FINDINGS: Please note that parenchymal and vascular abnormalities may be missed as intravenous contrast was not administered. Lower chest: Small bilateral pleural effusions, LEFT greater than RIGHT, noted with mild bibasilar atelectasis. Heavy coronary artery atherosclerotic calcifications are identified. Hepatobiliary: The liver and gallbladder are unremarkable. There is no evidence of intrahepatic or extrahepatic biliary dilatation. Pancreas: Unremarkable Spleen: Unremarkable Adrenals/Urinary Tract: The kidneys, adrenal glands and bladder are unremarkable except for mild RIGHT renal artery calcifications. Stomach/Bowel: A small bore feeding tube is present with the tip in the distal stomach. There is no evidence of bowel obstruction, definite bowel wall thickening or inflammatory changes. Colonic diverticulosis identified without evidence of acute diverticulitis. Vascular/Lymphatic: Aortic atherosclerosis. No enlarged abdominal or pelvic lymph nodes. Reproductive: Prostate is unremarkable. Other: No ascites, focal collection or pneumoperitoneum. Musculoskeletal: There is mild to moderate  enlargement and increased density of the RIGHT psoas muscle compatible with hemorrhage/hematoma. No acute or suspicious bony abnormalities are noted. IMPRESSION: 1. Mild to moderate enlargement and increased density of the RIGHT psoas muscle compatible with hemorrhage/hematoma. Infection/injury would be considered much less likely. Correlate clinically. 2. Small bilateral pleural effusions, LEFT greater than RIGHT, with mild bibasilar atelectasis. 3. Coronary artery disease. 4. Aortic Atherosclerosis (ICD10-I70.0). Electronically Signed   By: Harmon PierJeffrey  Hu M.D.   On: 01/09/2022 15:13   DG CHEST PORT 1 VIEW  Result Date: 01/09/2022 CLINICAL DATA:  [id number]. Reason for exam: dyspnea Principle problem: intraparenchymal hemorrhage of brain EXAM: PORTABLE CHEST 1 VIEW.  Patient is rotated. COMPARISON:  Chest x-ray 01/08/2022, CT chest 05/29/2018 FINDINGS: Enteric tube coursing below the hemidiaphragm with tip collimated off view. The heart and mediastinal contours are unchanged. Aortic calcification. Bilateral costophrenic angles collimated off view. No focal consolidation. No pulmonary edema. Known bilateral trace pleural effusions not well visualized. No pneumothorax. No acute osseous abnormality. IMPRESSION: 1. Please note limited evaluation due to bilateral costophrenic angles collimated off view. Known bilateral trace pleural effusions not well visualized. Please see separately dictated CT abdomen pelvis 01/09/2022. 2. Otherwise no other acute cardiopulmonary abnormality. 3.  Aortic Atherosclerosis (ICD10-I70.0). Electronically Signed   By: Tish FredericksonMorgane  Naveau M.D.   On: 01/09/2022 19:16    Recent Labs    01/10/22 0555 01/11/22 0606  WBC 8.0 10.6*  HGB 10.6* 11.4*  HCT 31.0* 33.1*  PLT 174 181    Recent Labs    01/11/22 0606  NA 129*  K 5.5*  CL 97*  CO2 23  GLUCOSE 172*  BUN 34*  CREATININE 1.40*  CALCIUM 8.8*     Intake/Output Summary (Last 24 hours) at 01/11/2022 0806 Last data filed at  01/11/2022 0552 Gross per 24 hour  Intake 110 ml  Output 3450 ml  Net -3340 ml         Physical Exam: Vital Signs Blood pressure 116/75, pulse 88, temperature (!) 97.5 F (36.4 C), resp. rate 18, height 5\' 7"  (1.702 m), weight 72.6 kg, SpO2 97 %.   General: Alert and oriented x 3, No apparent distress HEENT: Head is normocephalic, atraumatic, PERRLA, EOMI, sclera anicteric, oral mucosa pink and moist, dentition intact, ext ear canals clear,  Neck: Supple without JVD or lymphadenopathy Heart: Reg rate and rhythm. No murmurs rubs or gallops Chest: Some scattered expiratory wheezes and rhonchi mostly upper lobes this morning.  Mostly clears with coughing.  No distress. On RA, O2 sats. >96 %. Abdomen: Abdomen is distended. Guarding and pain of 8/10 LUQ and LLQ. Pain and tenderness with palpation. Positive bowel sounds. Extremities: No clubbing, cyanosis, Pulses are 2+ Note: There is edema +1 RLE and +2 RUE edema distal to splint Psych: Pt's affect is appropriate. Pt is cooperative Skin: RUE wrapped in Kerlix but clean dry and intact Neuro: Cranial nerves II through XII intact, alert and oriented x3 mild dysarthria, follows simple commands.. Musculoskeletal: Able to move all extremities. motor strength is 5 out of 5 in left and 3 of 5 right deltoid, bicep, tricep, trace grip, hip flexor, knee extensors, ankle dorsiflexor and plantar flexor   Assessment/Plan: 1. Functional deficits which require 3+ hours per day of interdisciplinary therapy in a comprehensive inpatient rehab setting. Physiatrist is providing close team supervision and 24 hour management of active medical problems listed below. Physiatrist and rehab team continue to assess barriers to discharge/monitor patient progress toward functional and medical goals  Care Tool:  Bathing  Bathing activity did not occur: Refused Body parts bathed by patient: Face         Bathing assist Assist Level: Supervision/Verbal cueing      Upper Body Dressing/Undressing Upper body dressing   What is the patient wearing?: Hospital gown only    Upper body assist Assist Level: Total Assistance - Patient < 25%    Lower Body Dressing/Undressing Lower body dressing      What is the patient wearing?: Pants     Lower body assist Assist for lower body dressing: Total Assistance - Patient < 25%     Toileting Toileting    Toileting assist Assist for toileting: Total Assistance - Patient < 25%     Transfers Chair/bed transfer  Transfers assist     Chair/bed transfer assist level: Dependent - mechanical lift     Locomotion Ambulation   Ambulation assist   Ambulation activity did not occur: Safety/medical concerns          Walk 10 feet activity   Assist  Walk 10 feet activity did not occur: Safety/medical concerns        Walk 50 feet activity   Assist Walk 50 feet with 2 turns activity did not occur: Safety/medical concerns         Walk 150 feet activity   Assist Walk 150 feet activity did not occur: Safety/medical concerns         Walk 10 feet on uneven surface  activity   Assist Walk 10 feet on uneven surfaces activity did not occur: Safety/medical concerns         Wheelchair  Assist Is the patient using a wheelchair?: Yes Type of Wheelchair: Manual    Wheelchair assist level: Dependent - Patient 0% Max wheelchair distance: 150    Wheelchair 50 feet with 2 turns activity    Assist        Assist Level: Dependent - Patient 0%   Wheelchair 150 feet activity     Assist      Assist Level: Dependent - Patient 0%   Blood pressure 116/75, pulse 88, temperature (!) 97.5 F (36.4 C), resp. rate 18, height  (1.702 m), weight 72.6 kg, SpO2 97 %.   Medical Problem List and Plan: 1. 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  Antithrombotics: -DVT/anticoagulation:  Pharmaceutical: Heparin             5/27- R brachial DVT- placed on Heparin gtt with no bolus per Dr Pearlean Brownie- pharmacy managing.              5/28- Hb stable-no signs of additonal stroke/bleeding so far.              -antiplatelet therapy: none 3. Pain Management: Tylenol as needed 4. Mood: LCSW to evaluate and provide emotional support             -antipsychotic agents: n/a 5. Neuropsych: This patient is capable of making decisions on his own behalf. 6. Skin/Wound Care: Routine skin care checks 7. Fluids/Electrolytes/Nutrition: Routine Is and Os and follow-up chemistries             --dysphagia 1; honey thick- Has Cortrak in place and receiving TF's.  6/4 continue TF, Coretrak in place 8: T2DM: Hgb A1c- metformin on hold due to AKI/variable intake (puree diet). --Monitor BS ac/hs and use SSI for elevated BS. --Change TF to 6 pm-6 am CBG (last 3)  Recent Labs    01/10/22 1642 01/10/22 2113 01/11/22 0522  GLUCAP 126* 148* 168*    6/1 Fair control, continue to hold metformin due to AKI/variable intake 6/4 still has fair control of CBG's, continue to trend 9: Hypertension: Off Lisinopril due to AKI/hyperkalemia  --continue amlodipine, atenolol, hydralazine Vitals:   01/10/22 1927 01/11/22 0404  BP: (!) 122/57 116/75  Pulse: 88 88  Resp: 20 18  Temp: 97.7 F (36.5 C) (!) 97.5 F (36.4 C)  SpO2: 96% 97%   6/1 well controlled, continue to follow 6/4 continue amlodipine atenolol and hydralazine well-controlled 10:  COPD: Continue Anoro Ellipta daily.  Flutter valve qid. Smoked 2ppd - CXR OK  --now off prednisone, no wheezing . --Duoneb prn   11: Alcohol abuse: cessation counseling. Off phenobarb. Check Thiamine level.  --continue B12, thiamine and folic  acid 12: Tobacco abuse: cessation counseling --continue nicotine patch 13: Hyperlipidemia: LDL- 192 and Trig- 499-->continue Crestor 40 mg 14: AKI may bhave CKD : Baseline SCr-?~1.36 --Encourage --drinking honey liquids but not touching puree's.              --continue water flushes qid via cortak -Creat  bumped up to 1.97- cont H2O flushes q 4 Na+ 134, hesitate increasing this , alb a little low but doubt significant 3rd spacing -Cr 1.62 yesterday, Na 131, will recheck tomorrow 6/4 Crt 1.55, Na 133, continue to trend with qMonday labs    Latest Ref Rng & Units 01/11/2022    6:06 AM 01/08/2022    5:37 AM 01/06/2022    5:08 AM  BMP  Glucose 70 - 99 mg/dL 627   035   009    BUN 8 - 23 mg/dL 34   40   47    Creatinine 0.61 - 1.24 mg/dL 3.81   8.29   9.37    Sodium 135 - 145 mmol/L 129   133   131    Potassium 3.5 - 5.1 mmol/L 5.5   4.5   3.9    Chloride 98 - 111 mmol/L 97   101   100    CO2 22 - 32 mmol/L 23   23   24     Calcium 8.9 - 10.3 mg/dL 8.8   8.5   8.6      15. Vitamin C deficiency: On daily supplement 16. Vitamin D deficiency: Continue weekly supplement.   17. 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 RUE swelling/weeping- resolved, RIght brachial DVT seen on vascular 5/26, will repeat given clinical  19.  Abd distension - constipation rather than ileus , CT abd pelvis showed retroperitoneal bleed in right psoas muscle no sig drop in hgb   20.  Urinary retention requiring I/O cath, add flomax 6/4 Continue Flomax  LOS: 11 days A FACE TO FACE EVALUATION WAS PERFORMED  US 01/11/2022, 8:06 AM

## 2022-01-11 NOTE — Progress Notes (Signed)
Physical Therapy Session Note  Patient Details  Name: Ronnie Jones MRN: [id number] Date of Birth: 22-Apr-1951  Today's Date: 01/11/2022 PT Individual Time: 3888-7579 PT Individual Time Calculation (min): 28 min   Short Term Goals: Week 1:  PT Short Term Goal 1 (Week 1): Pt will tolerate sitting in WC >2 hours between therapy PT Short Term Goal 1 - Progress (Week 1): Met PT Short Term Goal 2 (Week 1): Pt will transfer to Bed with max assist of 1 consistently PT Short Term Goal 2 - Progress (Week 1): Met PT Short Term Goal 3 (Week 1): Pt will ambulate 58f with max assist + 2 PT Short Term Goal 3 - Progress (Week 1): Met Week 2:  PT Short Term Goal 1 (Week 2): Pt will transfer with mod assist consistently without lift PT Short Term Goal 2 (Week 2): Pt wil will ambulate 357fwith LRAD and max assist of 1 PT Short Term Goal 3 (Week 2): Pt will propell WC 10031fith min assist Week 3:     Skilled Therapeutic Interventions/Progress Updates:   Pt supine w/PA in room instructing us [place] encourage fluids w/PT/nectar thick. Pt dons pants w/overall total assist. Rolls and bridges w/mod assist.  Able to scoot hips w/mod assist  Supine to sit w/heay mod assist and cues. assist. Initially leaning post R, pushes w/LUE.  Worked on reaching to L, reaching via sliding LUE along outside of LLE, mod to min assist. Sit to stand in stedy w/mod assist.  Pt leaning R, pushing strongly w/LUE.  Worked on reaching task to L while perched in stedy to promote midline/prevent pushing by actively engaging LUE.  Pt repeats reaching x 5 then requires rest break/fatigues quickly.    Worked on achieving midline and maintaining w/transition from semistand to stand in [place] IV pole as visual cue for midline, transfers from stand to sit in wc w/mod assist to control lowering/maintain midline.   Pt left oob in wc w/alarm belt set, chair tilted, wife at side, and needs in reach.   Therapy Documentation Precautions:   Precautions Precautions: Fall Precaution Comments: dense R hemiparesis Restrictions Weight Bearing Restrictions: No   Therapy/Group: Individual Therapy BarCallie FieldingT   BarJerrilyn Cairo5/2023, 4:06 PM

## 2022-01-12 ENCOUNTER — Inpatient Hospital Stay (HOSPITAL_COMMUNITY): Payer: Medicare HMO

## 2022-01-12 LAB — URINALYSIS, ROUTINE W REFLEX MICROSCOPIC
Bilirubin Urine: NEGATIVE
Glucose, UA: 50 mg/dL — AB
Hgb urine dipstick: NEGATIVE
Ketones, ur: NEGATIVE mg/dL
Leukocytes,Ua: NEGATIVE
Nitrite: NEGATIVE
Protein, ur: 100 mg/dL — AB
Specific Gravity, Urine: 1.014 (ref 1.005–1.030)
pH: 6 (ref 5.0–8.0)

## 2022-01-12 LAB — BASIC METABOLIC PANEL
Anion gap: 11 (ref 5–15)
BUN: 43 mg/dL — ABNORMAL HIGH (ref 8–23)
CO2: 20 mmol/L — ABNORMAL LOW (ref 22–32)
Calcium: 8.7 mg/dL — ABNORMAL LOW (ref 8.9–10.3)
Chloride: 98 mmol/L (ref 98–111)
Creatinine, Ser: 1.47 mg/dL — ABNORMAL HIGH (ref 0.61–1.24)
GFR, Estimated: 52 mL/min — ABNORMAL LOW (ref >60)
Glucose, Bld: 147 mg/dL — ABNORMAL HIGH (ref 70–99)
Potassium: 4.6 mmol/L (ref 3.5–5.1)
Sodium: 129 mmol/L — ABNORMAL LOW (ref 135–145)

## 2022-01-12 LAB — GLUCOSE, CAPILLARY
Glucose-Capillary: 151 mg/dL — ABNORMAL HIGH (ref 70–99)
Glucose-Capillary: 154 mg/dL — ABNORMAL HIGH (ref 70–99)
Glucose-Capillary: 122 mg/dL — ABNORMAL HIGH (ref 70–99)
Glucose-Capillary: 167 mg/dL — ABNORMAL HIGH (ref 70–99)

## 2022-01-12 IMAGING — CT CT HEAD W/O CM
4 series · 17 of 47 positions shown, 19 images · non-contrast
Comparison: [DATE]

CLINICAL DATA: Intracranial hemorrhage, follow-up



[Series 3: head without · axial · non-contrast · 0.48mm/px · z∈[-70,+60]mm · 7 of 36 slices shown, 9 images]
[im 5/36  brain]
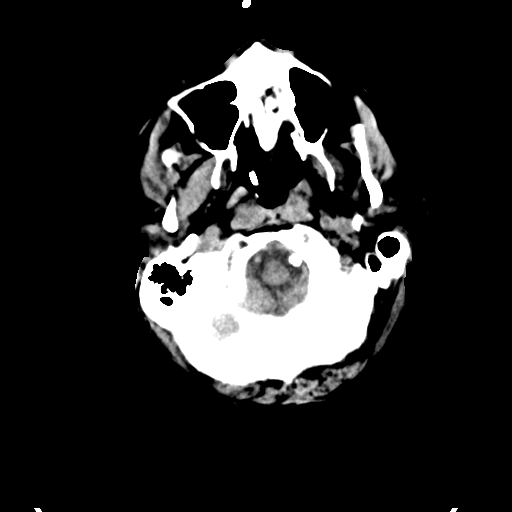
[im 5/36  bone]
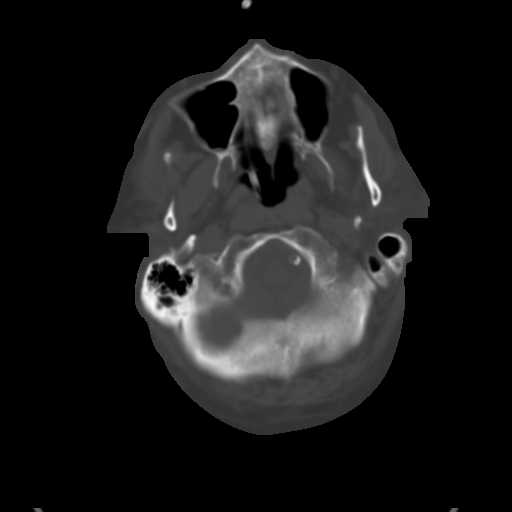
[im 9/36  brain]
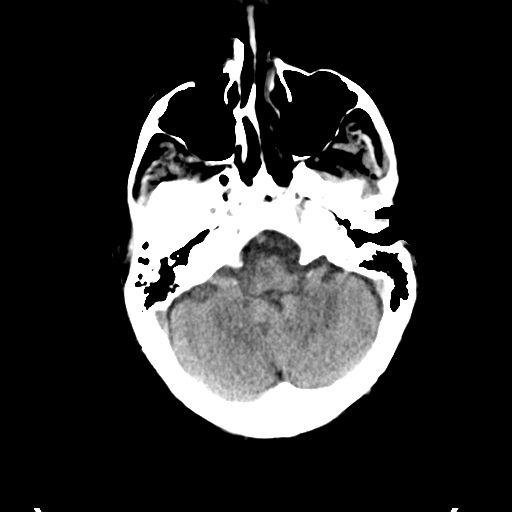
[im 14/36  brain]
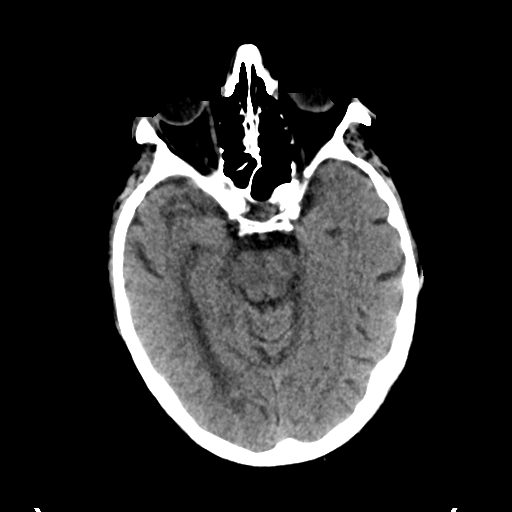
[im 18/36  brain]
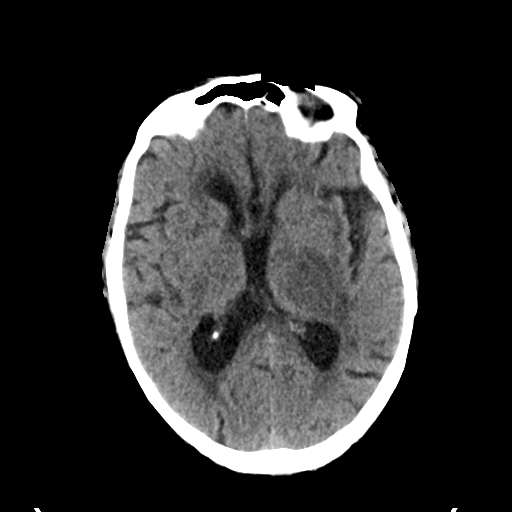
[im 22/36  brain]
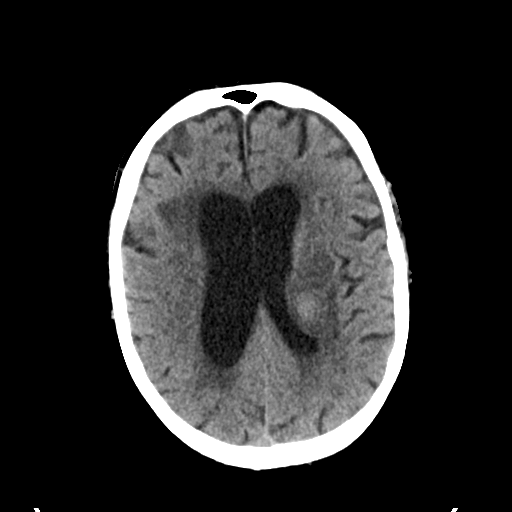
[im 22/36  bone]
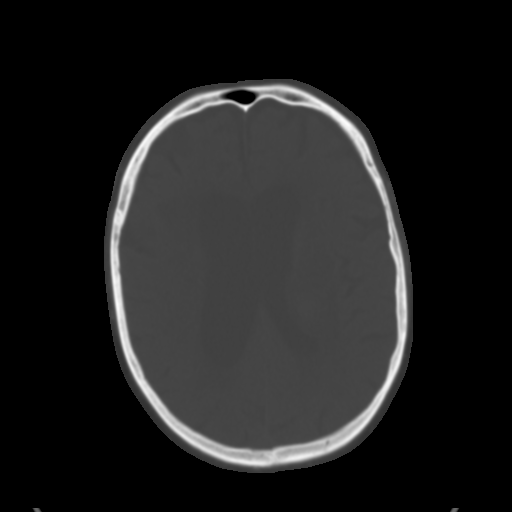
[im 27/36  brain]
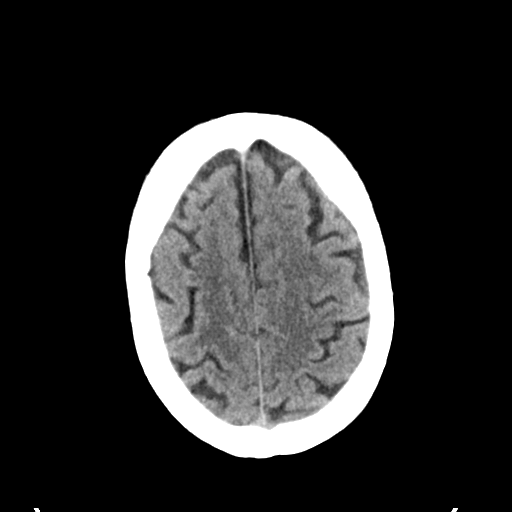
[im 31/36  brain]
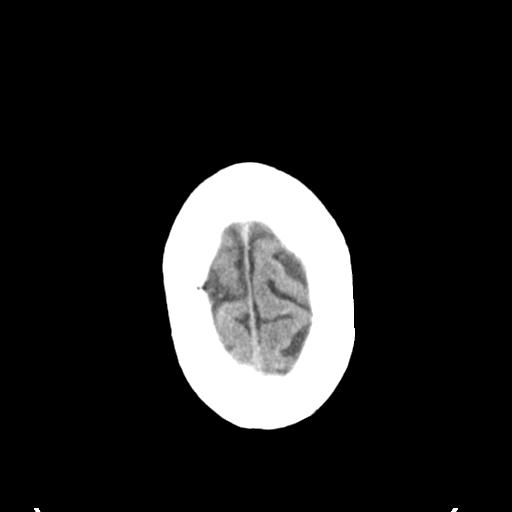

[Series 4: head bone · axial · 0.48mm/px · z∈[-74,-10]mm · 4 of 90 slices shown]
[im 9/90  bone]
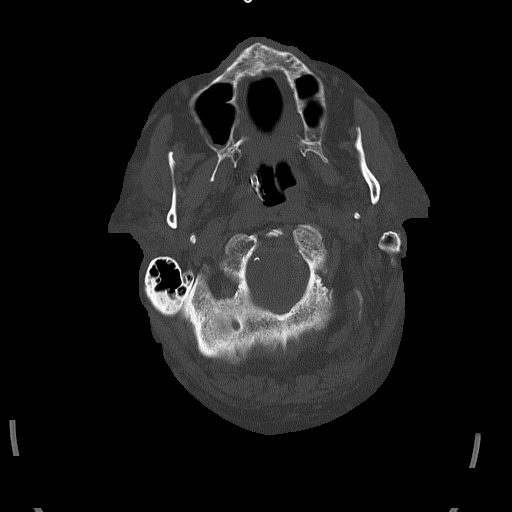
[im 18/90  bone]
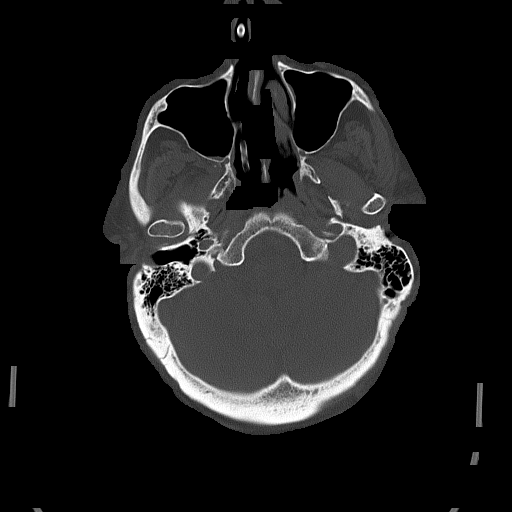
[im 27/90  bone]
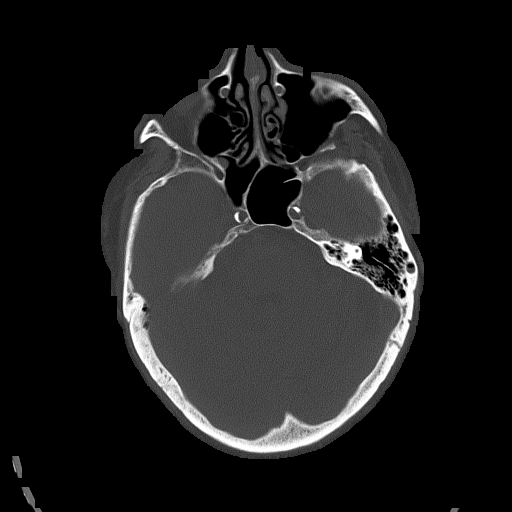
[im 41/90  bone]
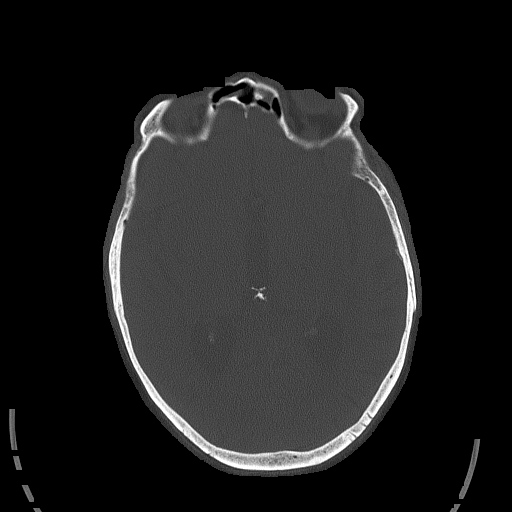

[Series 5: head without cor · coronal · non-contrast · 0.36mm/px · 3 of 68 slices shown]
[im 23/68  brain]
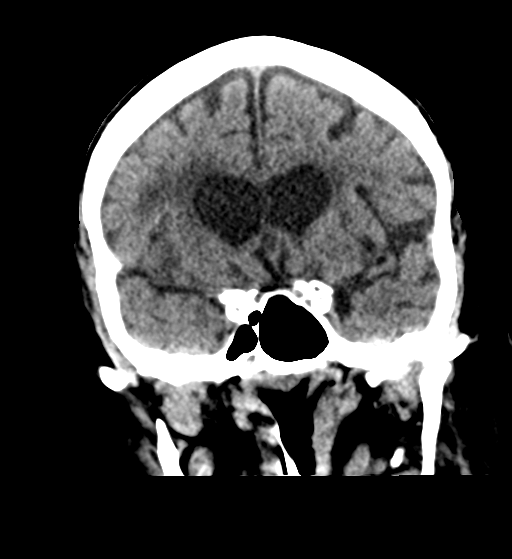
[im 30/68  brain]
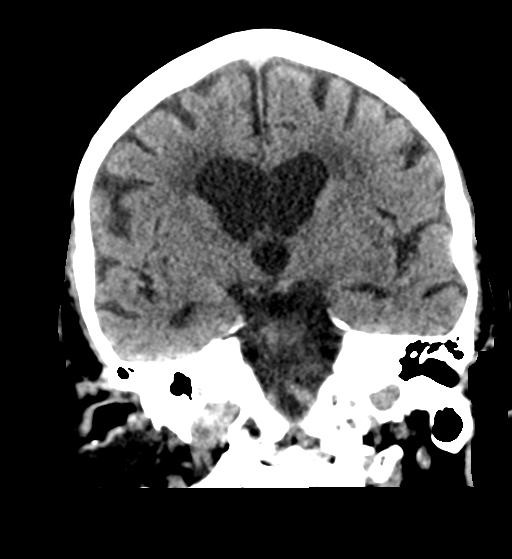
[im 38/68  brain]
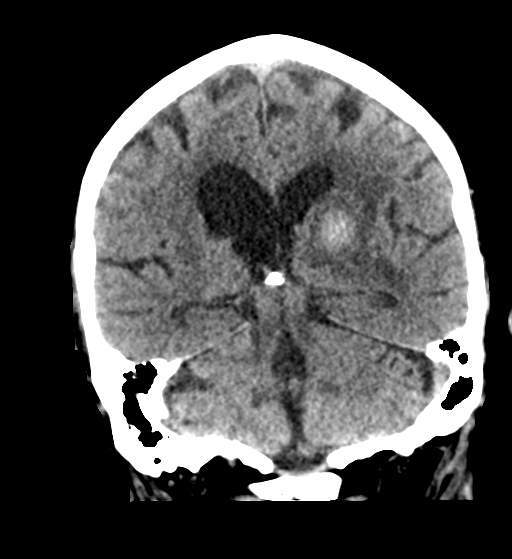

[Series 6: head without sag · sagittal · non-contrast · 0.39mm/px · 3 of 59 slices shown]
[im 20/59  brain]
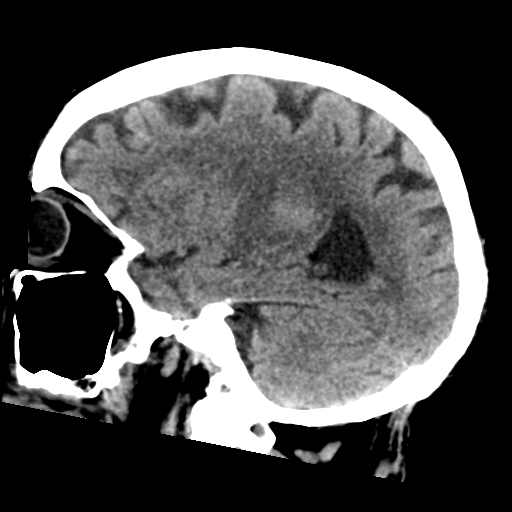
[im 30/59  brain]
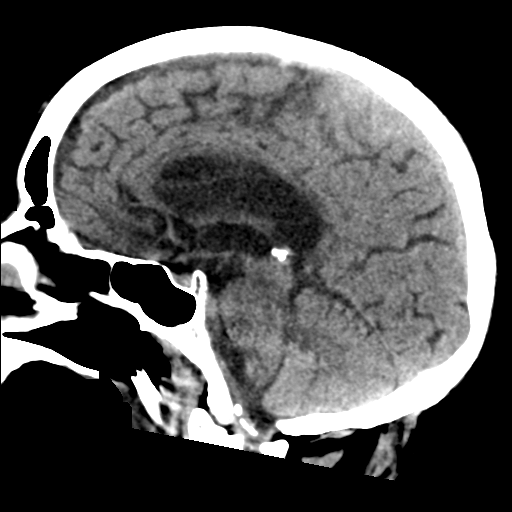
[im 39/59  brain]
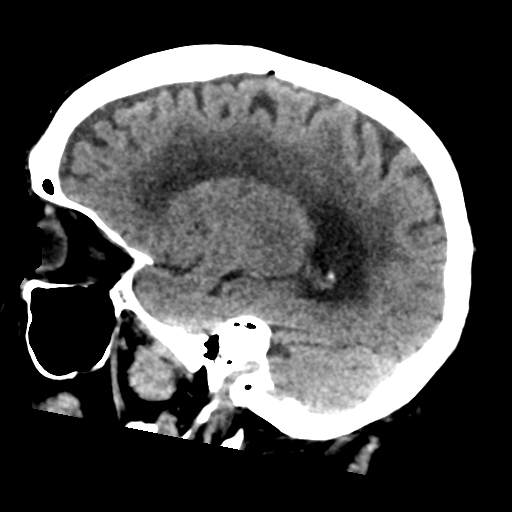

[17 of 47 positions shown; findings below may reference images not displayed]

FINDINGS: Brain: Significantly decreased hyperdense hemorrhage centered within
the left thalamus and adjacent white matter. Residual edema and mild
mass effect. Decreased layering intraventricular hemorrhage. No new
hemorrhage. No new loss of gray-white differentiation. Stable
findings of probable chronic microvascular ischemic changes.
Prominence of the ventricles and sulci reflects stable parenchymal
volume loss.

Vascular: No new finding.

Skull: Calvarium is unremarkable.

Sinuses/Orbits: No acute finding.

Other: None.
IMPRESSION: Expected evolution of left thalamic hemorrhage.  No new findings.

## 2022-01-12 MED ORDER — NEPRO/CARBSTEADY PO LIQD
1000.0000 mL | ORAL | Status: DC
  Filled 2022-01-12: qty 1000

## 2022-01-12 MED ORDER — NEPRO/CARBSTEADY PO LIQD: 1000.0000 mL | ORAL | Status: DC

## 2022-01-12 MED ORDER — PROSOURCE TF PO LIQD
45.0000 mL | Freq: Three times a day (TID) | ORAL | Status: DC
  Administered 2022-01-12 – 2022-01-19 (×21): 45 mL
  Filled 2022-01-12 (×22): qty 45

## 2022-01-12 MED ORDER — NEPRO/CARBSTEADY PO LIQD
1000.0000 mL | ORAL | Status: DC
  Administered 2022-01-12 – 2022-01-17 (×4): 1000 mL
  Filled 2022-01-12 (×10): qty 1000

## 2022-01-12 MED ORDER — NEPRO/CARBSTEADY PO LIQD
1000.0000 mL | ORAL | Status: DC
Start: 2022-01-12 — End: 2022-01-12

## 2022-01-12 MED ORDER — METHYLPHENIDATE HCL 5 MG PO TABS
5.0000 mg | ORAL_TABLET | Freq: Two times a day (BID) | ORAL | Status: DC
Start: 2022-01-12 — End: 2022-01-20
  Administered 2022-01-12 – 2022-01-20 (×16): 5 mg via ORAL
  Filled 2022-01-12 (×16): qty 1

## 2022-01-12 NOTE — Progress Notes (Signed)
Per PT-- patient noted to have significant decline yesterday and today. He is very fatigued and feels weak--denies specific pain but does not engage as much. Wife stated that he has only eaten bites since Saturday--both are in favor of ritalin for activation. Discussed with Dr. Vernie Shanks order CT head for follow up of bleed/edema as well as UA/UCS to rule out as cause of weakness/decline. Will start continuous tube feed for a few days to "catch up"--discussed importance of nutritional support with patient and wife.

## 2022-01-12 NOTE — Progress Notes (Signed)
Nutrition Brief Note  PA paged RD to discuss placing pt on continuous feeds due to poor PO intake for 3 days and "catch up".  Discussed a new rate for the tube feeds with the PA. PA to place new TF order. Discussed RD recommendation for long-term placement of access for nutrition if PO intake continues to be poor.   New TF regimen via Cortrak: - Nepro @ 50 mL/hr (1050 mL/day) - 45 mL ProSource TF - TID  Provides 2010 kcal, 118 gm protein, and 763 mL free water.   Recommend to PA to consult RD to adjust TF order when transition back to nocturnal feeds.     Kirby Crigler RD, LDN Clinical Dietitian See Loretha Stapler for contact information.

## 2022-01-12 NOTE — Progress Notes (Signed)
PROGRESS NOTE   Subjective/Complaints:   CXR and dopplers reviewed  Discussed result with RN and pt Pt awke, feels "bad " but cannot elaborate   ROS: limited by cognition   Objective:   DG Chest 2 View  Result Date: 01/11/2022 CLINICAL DATA:  Shortness of breath. EXAM: CHEST - 2 VIEW COMPARISON:  AP chest 01/09/2022, 01/08/2022, 01/07/2022; chest two views 12/31/2021; CT chest 05/29/2018; CT abdomen and pelvis 01/09/2022 FINDINGS: Cardiac silhouette is again mildly enlarged. Moderate calcifications are again seen within the aortic arch. Enteric tube descends below the diaphragm with the tip excluded by collimation. Improved aeration of the left lung base compared to most recent 01/09/2022 frontal radiograph. Minimal blunting of the posterior costophrenic angle on lateral view likely relates to the small left-greater-than-right pleural effusions previously seen on 01/09/2022 CT abdomen and pelvis. No significant airspace consolidation is seen. No pneumothorax. Mild-to-moderate multilevel degenerative disc changes of the thoracic spine. IMPRESSION: 1. Improved aeration of the lung bases compared to 01/09/2022 frontal radiograph. 2. Trace pleural effusions on lateral view. No significant lung consolidation. Electronically Signed   By: Neita Garnet M.D.   On: 01/11/2022 13:49   VAS [place] UPPER EXTREMITY VENOUS DUPLEX  Result Date: 01/11/2022 UPPER VENOUS STUDY  Patient Name:  [facility]  Date of Exam:   01/11/2022 Medical Rec #: [id number]       Accession #:    [id number] Date of Birth: 06-12-1951      Patient Gender: M Patient Age:   71 years Exam Location:  [facility] Procedure:      VAS [place] UPPER EXTREMITY VENOUS DUPLEX Referring Phys: Claudette Laws --------------------------------------------------------------------------------  Indications: Follow up right brachial DVT Limitations: Patient positioning/contracted and in pain.  Comparison Study: 01-01-2022 Right upper extremity venous was positive for                   brachial DVT and basilic SVT. Performing Technologist: Jean Rosenthal RDMS, RVT  Examination Guidelines: A complete evaluation includes B-mode imaging, spectral Doppler, color Doppler, and power Doppler as needed of all accessible portions of each vessel. Bilateral testing is considered an integral part of a complete examination. Limited examinations for reoccurring indications may be performed as noted.  Right Findings: +----------+------------+---------+-----------+----------+-------+ RIGHT     CompressiblePhasicitySpontaneousPropertiesSummary +----------+------------+---------+-----------+----------+-------+ IJV           Full       Yes       Yes                      +----------+------------+---------+-----------+----------+-------+ Subclavian               Yes       Yes                      +----------+------------+---------+-----------+----------+-------+ Axillary      Full       Yes       Yes                      +----------+------------+---------+-----------+----------+-------+ Brachial      Full       Yes  Yes                      +----------+------------+---------+-----------+----------+-------+ Radial        Full                                          +----------+------------+---------+-----------+----------+-------+ Ulnar         Full                                          +----------+------------+---------+-----------+----------+-------+ Cephalic      Full                                          +----------+------------+---------+-----------+----------+-------+ Basilic       Full                                          +----------+------------+---------+-----------+----------+-------+  Left Findings: +----+------------+---------+-----------+----------+-------+ LEFTCompressiblePhasicitySpontaneousPropertiesSummary  +----+------------+---------+-----------+----------+-------+ IJV     Full       Yes       Yes                      +----+------------+---------+-----------+----------+-------+  Summary:  Right: No evidence of deep vein thrombosis in the upper extremity. No evidence of superficial vein thrombosis in the upper extremity. Previously visualized right brachial and basilic DVT appears improved on today's examination.  Left: No evidence of thrombosis in the subclavian.  *See table(s) above for measurements and observations.  Diagnosing physician: Gerarda Fraction Electronically signed by Gerarda Fraction on 01/11/2022 at 7:05:01 PM.    Final     Recent Labs    01/10/22 0555 01/11/22 0606  WBC 8.0 10.6*  HGB 10.6* 11.4*  HCT 31.0* 33.1*  PLT 174 181    Recent Labs    01/11/22 0606 01/12/22 0517  NA 129* 129*  K 5.5* 4.6  CL 97* 98  CO2 23 20*  GLUCOSE 172* 147*  BUN 34* 43*  CREATININE 1.40* 1.47*  CALCIUM 8.8* 8.7*     Intake/Output Summary (Last 24 hours) at 01/12/2022 0743 Last data filed at 01/12/2022 0649 Gross per 24 hour  Intake 0 ml  Output 1975 ml  Net -1975 ml         Physical Exam: Vital Signs Blood pressure 134/72, pulse 78, temperature 98.4 F (36.9 C), resp. rate 18, height 5\' 7"  (1.702 m), weight 73.9 kg, SpO2 97 %.   General: Alert and oriented x 3, No apparent distress HEENT: Head is normocephalic, atraumatic, PERRLA, EOMI, sclera anicteric, oral mucosa pink and moist, dentition intact, ext ear canals clear,  Neck: Supple without JVD or lymphadenopathy Heart: Reg rate and rhythm. No murmurs rubs or gallops Chest: Some scattered expiratory wheezes and rhonchi mostly upper lobes this morning.  Mostly clears with coughing.  No distress. On RA, O2 sats. >96 %. Abdomen: Abdomen is distended.diffuse moderatePain and tenderness with palpation. Positive bowel sounds.+tympany  Extremities: No clubbing, cyanosis, Pulses are 2+ Note: There is edema +1 RLE and +2 RUE edema  distal to splint Psych: Pt's affect  is appropriate. Pt is cooperative Skin: RUE wrapped in Kerlix but clean dry and intact Neuro: Cranial nerves II through XII intact, alert and oriented x3 mild dysarthria, follows simple commands.. Musculoskeletal: Able to move all extremities. motor strength is 5 out of 5 in left and 3 of 5 right deltoid, bicep, tricep, trace grip, hip flexor, knee extensors, ankle dorsiflexor and plantar flexor   Assessment/Plan: 1. Functional deficits which require 3+ hours per day of interdisciplinary therapy in a comprehensive inpatient rehab setting. Physiatrist is providing close team supervision and 24 hour management of active medical problems listed below. Physiatrist and rehab team continue to assess barriers to discharge/monitor patient progress toward functional and medical goals  Care Tool:  Bathing  Bathing activity did not occur: Refused Body parts bathed by patient: Face, Left upper leg, Abdomen, Chest, Right arm   Body parts bathed by helper: Left arm, Front perineal area, Buttocks, Left upper leg, Right lower leg, Left lower leg     Bathing assist Assist Level: Moderate Assistance - Patient 50 - 74%     Upper Body Dressing/Undressing Upper body dressing   What is the patient wearing?: Pull over shirt    Upper body assist Assist Level: Maximal Assistance - Patient 25 - 49%    Lower Body Dressing/Undressing Lower body dressing      What is the patient wearing?: Pants, Incontinence brief     Lower body assist Assist for lower body dressing: Total Assistance - Patient < 25%     Toileting Toileting    Toileting assist Assist for toileting: Total Assistance - Patient < 25%     Transfers Chair/bed transfer  Transfers assist     Chair/bed transfer assist level: 2 Helpers     Locomotion Ambulation   Ambulation assist   Ambulation activity did not occur: Safety/medical concerns          Walk 10 feet activity   Assist   Walk 10 feet activity did not occur: Safety/medical concerns        Walk 50 feet activity   Assist Walk 50 feet with 2 turns activity did not occur: Safety/medical concerns         Walk 150 feet activity   Assist Walk 150 feet activity did not occur: Safety/medical concerns         Walk 10 feet on uneven surface  activity   Assist Walk 10 feet on uneven surfaces activity did not occur: Safety/medical concerns         Wheelchair     Assist Is the patient using a wheelchair?: Yes Type of Wheelchair: Manual    Wheelchair assist level: Dependent - Patient 0% Max wheelchair distance: 150    Wheelchair 50 feet with 2 turns activity    Assist        Assist Level: Dependent - Patient 0%   Wheelchair 150 feet activity     Assist      Assist Level: Dependent - Patient 0%   Blood pressure 134/72, pulse 78, temperature 98.4 F (36.9 C), resp. rate 18, height  (1.702 m), weight 73.9 kg, SpO2 97 %.   Medical Problem List and Plan: 1. 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  Antithrombotics: -DVT/anticoagulation:  Pharmaceutical: d/c anticoaguluation hx ICH, DVT resolved              5/27- R brachial DVT- resolved on vascular imaging 01/11/22 3. Pain Management: Tylenol as needed 4. Mood: LCSW to evaluate and provide emotional support             -antipsychotic agents: n/a 5. Neuropsych: This patient is capable of making decisions on his own behalf. 6. Skin/Wound Care: Routine skin care checks 7. Fluids/Electrolytes/Nutrition: Routine Is and Os and follow-up chemistries             --dysphagia 1; honey thick- Has Cortrak in place and receiving  TF's.  6/4 continue TF, Coretrak in place 8: T2DM: Hgb A1c- metformin on hold due to AKI/variable intake (puree diet). --Monitor BS ac/hs and use SSI for elevated BS. --Change TF to 6 pm-6 am CBG (last 3)  Recent Labs    01/11/22 1640 01/11/22 2103 01/12/22 0523  GLUCAP 127* 145* 151*   Controlled 6/6 9: Hypertension: Off Lisinopril due to AKI/hyperkalemia  --continue amlodipine, atenolol, hydralazine Vitals:   01/12/22 0309 01/12/22 0737  BP: 134/72   Pulse: 100 78  Resp: (!) 24 18  Temp: 98.4 F (36.9 C)   SpO2: 94% 97%   6/6 on cardura 10:  COPD: Continue Anoro Ellipta daily.  Flutter valve qid. Smoked 2ppd - CXR OK  --now off prednisone, no wheezing . --Duoneb prn   11: Alcohol abuse: cessation counseling. Off phenobarb. Check Thiamine level.  --continue B12, thiamine and folic acid 12: Tobacco abuse: cessation counseling --continue nicotine patch 13: Hyperlipidemia: LDL- 192 and Trig- 499-->continue Crestor 40 mg 14: AKI may bhave CKD : Baseline SCr-?~1.36 --Encourage --drinking honey liquids but not touching puree's.              --continue water flushes qid via cortak -Creat bumped up to 1.97- cont H2O flushes 200ml q 4 Na+ 134, hesitate increasing this , alb a little low but doubt significant 3rd spacing -Cr fluctuating 1.4-1.5 range     Latest Ref Rng & Units 01/12/2022    5:17 AM 01/11/2022    6:06 AM 01/08/2022    5:37 AM  BMP  Glucose 70 - 99 mg/dL 409147   [id number]   [id number]    BUN 8 - 23 mg/dL 43   34   40    Creatinine 0.61 - 1.24 mg/dL 7.821.47   9.561.40   2.131.55    Sodium 135 - 145 mmol/L 129   129   133    Potassium 3.5 - 5.1 mmol/L 4.6   5.5   4.5    Chloride 98 - 111 mmol/L 98   97   101    CO2 22 - 32 mmol/L 20   23   23     Calcium 8.9 - 10.3 mg/dL 8.7   8.8   8.5      15. Vitamin C deficiency: On daily supplement 16. Vitamin D deficiency: Continue weekly supplement.   17. 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 RUE swelling/weeping- resolved, RIght  brachial DVT seen on vascular US 5/26, will repeat given clinical  19.  Abd distension - Likely TF related trying nepro , CT abd pelvis showed retroperitoneal bleed in right psoas muscle no sig drop in hgb   20.  Urinary retention requiring I/O cath, alpha blocker   LOS: 12 days A FACE TO FACE EVALUATION WAS PERFORMED  Erick Colace 01/12/2022, 7:43 AM

## 2022-01-12 NOTE — Progress Notes (Signed)
Speech Language Pathology Daily Session Note  Patient Details  Name: Ronnie Jones MRN: [id number] Date of Birth: 29-May-1951  Today's Date: 01/12/2022 SLP Individual Time: 1446-1530 SLP Individual Time Calculation (min): 44 min  Short Term Goals: Week 2: SLP Short Term Goal 1 (Week 2): Patient will consume current diet without overt s/sx of aspiration with min-to-mod A verbal cues for use of swallowing compensatory strategies SLP Short Term Goal 2 (Week 2): Patient will be oriented x4 with min-to-mod A for use of external aids SLP Short Term Goal 3 (Week 2): Patient will complete basic problem solving with mod A verval/visual cues SLP Short Term Goal 4 (Week 2): Patient will implement speech intelligbility strategies at the word level with min-tomod A verbal cues SLP Short Term Goal 5 (Week 2): Patient will exhibit recall of recent information with mod A verbal/visual cues for use of external aids  Skilled Therapeutic Interventions: Pt seen for skilled ST with focus on cognitive goals, pt in bed and agreeable to therapeutic tasks. When asked how patient's day was and how he was feeling he responded "I'm fine". Pt did participate in all activities this date but benefits from encouragement to increase length of response (often responding in yes/no or very short phrases). SLP facilitating problem ID/problem solving activity by providing mod A cues for problem ID and min A cues for solution generation. Pt was not agreeable to any PO intake at this time, RN reports thin liquid was at bedside earlier which she removed. SLP providing honey thick water for hydration, pt refusing at this time but reports consuming a good lunch. RN in and out starting pt tube feeding, pt noted with increased throat clear once tube feed initiated, question reflux? HOB was >30 degrees however SLP raising HOB higher to mitigate potential reflux of tube feeding which reduced s/s. Pt fatiguing toward end of session, left in bed  with alarm set and all needs within reach. Cont ST POC.   Pain Pain Assessment Pain Scale: 0-10 Pain Score: 5   Therapy/Group: Individual Therapy  Dewaine Conger 01/12/2022, 2:27 PM

## 2022-01-12 NOTE — Progress Notes (Signed)
Pt noted to have audible wheezing, heavy labored breathing, and increased respirations. Pt denies Cp/SOB at this time. Pt was encouraged to do breathing treatment. Pt agreed and started the PRN Albuterol. Before the treatment was completed, pt removed it and said he didn't need it. Pt still appears to have difficulty breathing but refusing additional interventions at this time.

## 2022-01-12 NOTE — Progress Notes (Signed)
Occupational Therapy Session Note  Patient Details  Name: Ronnie Jones MRN: [id number] Date of Birth: 01-20-1951  Today's Date: 01/12/2022 OT Individual Time: 4010-2725 OT Individual Time Calculation (min): 41 min  + 13 and Today's Date: 01/12/2022 OT Missed Time: 30 Minutes Missed Time Reason: Patient fatigue;Other (comment) (lethargic, unable to stay awake)   Short Term Goals: Week 1:  OT Short Term Goal 1 (Week 1): Pt will report for 2 consecutive sessions < 3/10 pain OT Short Term Goal 1 - Progress (Week 1): Met OT Short Term Goal 2 (Week 1): Pt will sit EOB at least 3 mins during ADL task to promote OOB tolerance OT Short Term Goal 2 - Progress (Week 1): Met OT Short Term Goal 3 (Week 1): Pt will complete 1 step of dressing task at bed level with no more than min verbal cues OT Short Term Goal 3 - Progress (Week 1): Met Week 2:  OT Short Term Goal 1 (Week 2): Pt will don shirt with mod A. OT Short Term Goal 2 (Week 2): Pt will don pants with mod A. OT Short Term Goal 3 (Week 2): Pt will complete >3 grooming tasks at sink with S.  Skilled Therapeutic Interventions/Progress Updates:    Session 1 ([id number]) : Pt received asleep semi-reclined in bed, awakened to voice, initially appears agreeable to therapy. Session focus on self-care retraining, activity tolerance in prep for improved ADL/IADL/func mobility performance + decreased caregiver burden.  SatO2 at ~93% on RA, attempted to guide through breathes on IS, but pt only able to attempt 3 breaths before declining additional reps. HR at rest at 106 bpm semi-reclined in bed.  Sat up in bed and pt agreeable to try breakfast. Does not initiate reaching for spoon or cup, but will bring to mouth once handed. Only took 2 bites before falling asleep, declining all drinks. Suctioned food residue out of mouth as pt not initiating swallowing.   PROM to RUE joints, but pt endorses pain with elbow extension. Facilitated hand pumps to  facilitiate decrease in dependent edema in digits.   Total A to don pants bed level and to boost up in bed. Pt able to turn R and pushing through BLE to assist momentarily, but noted functional decline/increase in fatigue/lethargy since this therapist has last worked with pt several days ago.  SatO2 at 94% on RA, HR at 94 bpm at end of session. Session terminated early due to pt lethargy/inability to meaningfully participate in therapy at this time, missing 30 minutes of scheduled OT.   Pt left with HOB over 30 degrees with bed alarm engaged, call bell in reach, and all immediate needs met.    Session 2 [phone]): Pt received finishing lunch with NT present, no c/o pain, agreeable to earlier therapy to make up missed minutes from earlier session. Session focus on self-care retraining, activity tolerance, RUE NMR in prep for improved ADL/IADL/func mobility performance + decreased caregiver burden.  Came to sitting EOB with max A to progress BLE off bed and to lift trunk. Pt pushes heavily to his R and requires consistent mod to max A for static sitting balance EOB, last week he was close S to CGA for EOB sitting balance. Total A at EOB to change out shirt + wash UB. Pt able to thread LUE and wash face to assist. Stedy transfer with heavy max A of 2 > TIS, increased pushing in the stedy requiring multimodal cuing to maintain upright posture with poor ability to self-correct.  Pt agreeable to go outside as wife reports he loves the outdoors. Dep TIS transport to and from gym.  Massed practice outdoors of volley ball toss with use of 1 lb dowel rod and ace wrap to R hand to different vantage points. Pt additionally participated in massed practice of BUE catching and tossing ball, dep for incorporate RUE into task.  Max A squat-pivot of 2 > bed on his R and same assist to return to supine and boost up in bed.  Pt left with HOB at 30 degrees with NT present with bed alarm engaged, call bell in  reach, and all immediate needs met.    Therapy Documentation Precautions:  Precautions Precautions: Fall Precaution Comments: dense R hemiparesis Restrictions Weight Bearing Restrictions: No  Pain:   See session notes ADL: See Care Tool for more details.   Therapy/Group: Individual Therapy  Volanda Napoleon MS, OTR/L  01/12/2022, 6:49 AM

## 2022-01-12 NOTE — Progress Notes (Signed)
Physical Therapy Session Note  Patient Details  Name: Ronnie Jones MRN: [id number] Date of Birth: 03-24-1951  Today's Date: 01/12/2022 PT Individual Time: 1020-1104 PT Individual Time Calculation (min): 44 min   Short Term Goals: Week 2:  PT Short Term Goal 1 (Week 2): Pt will transfer with mod assist consistently without lift PT Short Term Goal 2 (Week 2): Pt wil will ambulate 74ft with LRAD and max assist of 1 PT Short Term Goal 3 (Week 2): Pt will propell WC 156ft with min assist  Skilled Therapeutic Interventions/Progress Updates:    Pt received supine in bed resting, but easily awakens and with encouragement is agreeable to participate in therapy session. Supine>sitting R EOB, HOB slightly elevated and bedrail available, with max assist for R LE management off EOB and bringing trunk upright. Sitting EOB, pt with consistent R and posterior lean/LOB with delayed and inadequate balance recovery strategies requiring +2 mod assist to maintain upright while therapist donned shoes and R LE [place] Walk-on Reaction GRAFO total assist.  L squat pivot EOB>TIS w/c with heavy max assist of 1 for lifting and pivoting hips, maintaining trunk control to prevent R LOB, and block R knee to promote WBing while +2 assist provided close guarding for safety - pt requires a few trials before being able to more adequately clear his hips for the transfer.    Transported to/from gym in w/c for time management and energy conservation.  Sit>stands x3 during session from TIS w/c>L UE support on hallway rail with heavy mod progressed to light mod assist for lifting to rise but therapist consistently blocking R knee buckle - palpable and visible R quad activation/contraction while rising into standing.   Gait training ~46ft x3 at L hallway rail with heavy max assist of 1 as described below and +2 w/c follow for safety with 3rd person providing visual cuing in front of the patient to maintain upright posture  -  requires repeated cuing to improve trunk/hip extension to achieve upright posture with minimal ability to improve without manual facilitation and then inability to sustain  - total assist to block R knee during stance even while wearing GRAFO along with facilitation at R hip for increased extension - total assist to advance R LE during swing with maybe 1x trace hip flexor activation noted but difficult to determine Primarily only achieving step-to pattern leading with R LE despite attempts at cuing and facilitation for reciprocal pattern with increased L LE step lengths.  Pt reports he feels "like shit" during session with pt unable to clarify why he feels this way and he is very quiet during session with only short verbal responses to therapist's questions due to not feeling well, but despite this, pt is agreeable to all mobility tasks.  Transported back to room and [name], PA present to discuss pt's progress and CLOF with this therapist reporting a decline in his function compared to when last seen by this therapist and asked PA to discuss more with pt's primary therapy team for further details.  Nurse tech requested pt return to bed for bladder scan.  R squat pivot w/c>EOB with heavy max assist of 1 for lifting and pivoting hips while preventing R trunk LOB, and blocking R knee to promote WBing with +2 min assist for safety of transfer.  Sit>supine with max assist for trunk descent and R LE management onto bed.  Pt left supine in bed with needs in reach and NT present to assume care of patient.  Therapy Documentation Precautions:  Precautions Precautions: Fall Precaution Comments: dense R hemiparesis Restrictions Weight Bearing Restrictions: No   Pain:  No complaints of pain throughout session.    Therapy/Group: Individual Therapy  Ginny Forth , PT, DPT, NCS, CSRS 01/12/2022, 8:02 AM

## 2022-01-13 DIAGNOSIS — Z515 Encounter for palliative care: Secondary | ICD-10-CM

## 2022-01-13 DIAGNOSIS — Z7189 Other specified counseling: Secondary | ICD-10-CM

## 2022-01-13 LAB — URINE CULTURE: Culture: NO GROWTH

## 2022-01-13 LAB — GLUCOSE, CAPILLARY
Glucose-Capillary: 184 mg/dL — ABNORMAL HIGH (ref 70–99)
Glucose-Capillary: 186 mg/dL — ABNORMAL HIGH (ref 70–99)
Glucose-Capillary: 166 mg/dL — ABNORMAL HIGH (ref 70–99)
Glucose-Capillary: 151 mg/dL — ABNORMAL HIGH (ref 70–99)

## 2022-01-13 MED ORDER — AMLODIPINE BESYLATE 10 MG PO TABS
10.0000 mg | ORAL_TABLET | Freq: Every day | ORAL | Status: DC
  Administered 2022-01-13 – 2022-01-21 (×9): 10 mg via ORAL
  Filled 2022-01-13 (×9): qty 1

## 2022-01-13 MED ORDER — HYDRALAZINE HCL 50 MG PO TABS
50.0000 mg | ORAL_TABLET | Freq: Three times a day (TID) | ORAL | Status: DC
  Administered 2022-01-13 – 2022-01-21 (×24): 50 mg via ORAL
  Filled 2022-01-13 (×24): qty 1

## 2022-01-13 MED ORDER — IPRATROPIUM-ALBUTEROL 0.5-2.5 (3) MG/3ML IN SOLN
3.0000 mL | Freq: Three times a day (TID) | RESPIRATORY_TRACT | Status: DC
  Administered 2022-01-13 – 2022-01-18 (×14): 3 mL via RESPIRATORY_TRACT
  Filled 2022-01-13 (×16): qty 3

## 2022-01-13 MED ORDER — MIRTAZAPINE 15 MG PO TBDP
15.0000 mg | ORAL_TABLET | Freq: Every day | ORAL | Status: DC
  Administered 2022-01-13 – 2022-01-20 (×8): 15 mg via ORAL
  Filled 2022-01-13 (×9): qty 1

## 2022-01-13 NOTE — Progress Notes (Signed)
Occupational Therapy Session Note  Patient Details  Name: Ronnie Jones MRN: [id number] Date of Birth: 1951-06-05  Today's Date: 01/13/2022 OT Individual Time: 0700-0810 OT Individual Time Calculation (min): 70 min + 46 min   Short Term Goals: Week 1:  OT Short Term Goal 1 (Week 1): Pt will report for 2 consecutive sessions < 3/10 pain OT Short Term Goal 1 - Progress (Week 1): Met OT Short Term Goal 2 (Week 1): Pt will sit EOB at least 3 mins during ADL task to promote OOB tolerance OT Short Term Goal 2 - Progress (Week 1): Met OT Short Term Goal 3 (Week 1): Pt will complete 1 step of dressing task at bed level with no more than min verbal cues OT Short Term Goal 3 - Progress (Week 1): Met Week 2:  OT Short Term Goal 1 (Week 2): Pt will don shirt with mod A. OT Short Term Goal 2 (Week 2): Pt will don pants with mod A. OT Short Term Goal 3 (Week 2): Pt will complete >3 grooming tasks at sink with S.  Skilled Therapeutic Interventions/Progress Updates:    Session 1 (5520-8022): Pt received asleep in bed but easily awakened to voice, reports he didn't sleep well but unable to elaborate, agreeable to therapy. Session focus on self-care retraining, activity tolerance, bed mobility, midline orientation in prep for improved ADL/IADL/func mobility performance + decreased caregiver burden. Total A bed level to don pants and B shoes/R AFO. Pt able to assist in rolling R and bridging to assist to pull them up. RN present to disconnect TF for therapy. Attempted supine > sitting EOB, but pt unable to come up with total A of 1 and no +2 currently available. Poor initiation and ability to lift trunk this date. Returned to supine total A, but pt able to push through BLE to assist in boosting up in bed.   Guided through 10 breaths of IS. SatO2 at 93 - 94% on RA. HR at 99 - 101 bpm.   Rehab tech then present and able to come into sitting EOB with total A of 2. Pushes hard to the R. MD in/out morning  assessment, discussed recent decline in function and MD to adjust schedule to 15/7 due to fatigue/decreased tolerance to 3 hours of therapy daily.  Sitting EOB, total A to doff/don shirt, apply deodorant and comb hair due to R lean/pushing. Pt with increasing in wheezing when sitting EOB. Total A of 2 to return to supine and boost up in bed.  Pt left with HOB at 30 degrees with ed alarm engaged, call bell in reach, and all immediate needs met.    Session 2 [phone]): Pt received semi-reclined in bed with wife present, agreeable to therapy with encouragement. Session focus on self-care retraining, activity tolerance, midline orientation, R visual scanning in prep for improved ADL/IADL/func mobility performance + decreased caregiver burden. No c/o pain but endorses fatigue. TF noted to have disconnected and spilled onto bed, pt agreeable to get out of bed in order to change sheet. RN present to stop TF for therapy. Came to sitting EOB with max A of 2, needed heavy support at trunk to prevent posterior LOB. Total A to change out dirty shirt. Total A of 2 in stedy > TIS due to increased pushing.   SatO2 at 98% on RA.   Pt completed the following games on BITS to target R visual scanning, trunk control, and activity tolerance.  Game: Single Target Accuracy: 51.43% Reaction Time: 6.58  secs  Duration Time: 2 min Hits: 18   Accuracy: 53.49% Reaction Time: 4.67 secs Duration Time: 2 min Hits: 23   Finally, pt completed 20 reps on kinetron at 80 cm/sec with assist on the RLE to target generalized activiyt tolerance and RLE NMR. Frequent rest breaks throughout due to fatigue.  Squat-pivot > bed on his R with max A of 2 via 2 squats. Total A of 2 to return to supine.  Pt left with HOB over 30 degrees with wife present and bed alarm engaged, call bell in reach, and all immediate needs met.    Therapy Documentation Precautions:  Precautions Precautions: Fall Precaution Comments: dense R  hemiparesis Restrictions Weight Bearing Restrictions: No  Pain: see session notes ADL: See Care Tool for more details.   Therapy/Group: Individual Therapy  Volanda Napoleon MS, OTR/L  01/13/2022, 6:51 AM

## 2022-01-13 NOTE — Plan of Care (Signed)
Pt's plan of care adjusted to 15/7 after speaking with care team and discussed with MD in team conference as pt currently unable to tolerate current therapy schedule with OT, PT, and SLP.   

## 2022-01-13 NOTE — Progress Notes (Signed)
Physical Therapy Session Note  Patient Details  Name: Ronnie Jones MRN: [id number] Date of Birth: 12-Aug-1950  Today's Date: 01/13/2022      Short Term Goals: Week 1:  PT Short Term Goal 1 (Week 1): Pt will tolerate sitting in WC >2 hours between therapy PT Short Term Goal 1 - Progress (Week 1): Met PT Short Term Goal 2 (Week 1): Pt will transfer to Bed with max assist of 1 consistently PT Short Term Goal 2 - Progress (Week 1): Met PT Short Term Goal 3 (Week 1): Pt will ambulate 48f with max assist + 2 PT Short Term Goal 3 - Progress (Week 1): Met Week 2:  PT Short Term Goal 1 (Week 2): Pt will transfer with mod assist consistently without lift PT Short Term Goal 2 (Week 2): Pt wil will ambulate 337fwith LRAD and max assist of 1 PT Short Term Goal 3 (Week 2): Pt will propell WC 10086fith min assist  Skilled Therapeutic Interventions/Progress Updates:   Pt received supine in bed. Asleep. Pt aroused with great effort and declines PT treatment with head shake and then closing eyes. Pt continues to keep eyes closed and unresponsive to PT encouragement to movement. Left in bed on 2L/min O2 and all needs met.      Therapy Documentation Precautions:  Precautions Precautions: Fall Precaution Comments: dense R hemiparesis Restrictions Weight Bearing Restrictions: No General:   Missed time: 60 min  Vital Signs: Therapy Vitals Temp: 98.6 F (37 C) Temp Source: Oral Pulse Rate: (!) 104 Resp: 20 BP: (!) 144/69 Patient Position (if appropriate): Lying Oxygen Therapy SpO2: 93 % O2 Device: 2 L.min O2 Pain: Pain Assessment Pain Scale: 0-10 Pain Score: 3     Therapy/Group: Individual Therapy  AusLorie Phenix7/2023, 7:59 AM

## 2022-01-13 NOTE — Consult Note (Addendum)
Palliative Medicine Inpatient Consult Note  Consulting Provider: Charlett Blake, MD  Reason for consult:   Ronnie Jones Palliative Medicine Consult  Reason for Consult? goals of care   Large Left ICH, severe dysphagia, poor prognosis for functional recovery, discuss GOC along with decision whether to place PEG, wife is only caregiver   01/13/2022  HPI:  Per intake H&P --> Ronnie Jones is a 71 year old gentleman with a past medical history of ETOH use (at least 1 pint per day), COPD, HTN, tobacco use (2-3 packs per day), and vitamin D deficiency. 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 Has been in CIR for 12 days to rehabilitate. Palliative care has been asks to get involved to further aid in goals of care conversations. Patient is not thriving in rehabilitation and we have been asked to discuss PEG tube placement.   Clinical Assessment/Goals of Care:  *Please note that this is a verbal dictation therefore any spelling or grammatical errors are due to the "[employer]" system interpretation.  I have reviewed medical records including EPIC notes, labs and imaging, received report from bedside RN, assessed the patient.    Dr. Marianna Payment and I met with Ronnie Jones and his spouse, Ronnie Jones to further discuss diagnosis prognosis, Ronnie Jones, EOL wishes, disposition and options.   I introduced Palliative Medicine as specialized medical care for people living with serious illness. It focuses on providing relief from the symptoms and stress of a serious illness. The goal is to improve quality of life for both the patient and the family.  Shared that an additional role is to offer support for patients who are experiencing trials with their health.  We can help act as a conduit in communication to better support goals of each individual patient.  Dr. Marianna Payment offered empathetic support upon conversation introduction with Ronnie Jones.  He shared that he realizes  Ronnie Jones's clinical state is very trial some and that he went from being very active and mobile to now requiring help with all activities of daily living. Dr. Marianna Payment shared that this must be very hard for Ronnie Jones. Ronnie Jones agrees to this.  Ronnie Jones shares that he lives in [name], [name].  He is married and has one child.  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  He likes to work with his hands.  Reviewed the concerns in the setting of Ronnie Jones lack of want and desire to work aggressively with the physical therapy team's. Ronnie Jones shares that he does not feel like he is making progress after having been in CIR for almost 2 weeks now which is frustrating.  The reality that strokes affect each individual differently in their recovery process is variable was discussed.  Ronnie Jones is overall quite down and this was brought to his attention which she does agree with.  We reviewed the importance when feeling so low of having a little extra help through medicines like antidepressants.  At this time Ronnie Jones is open to this as an option to perhaps motivate him to be more active and concurrently help with his appetite.  A conversation surrounding nutritional state was had. Ronnie Jones has very little want or desire to formally eat food.  His wife shares that she has been present to help support him eating though at best he is only eaten about half a meal tray and that was once in the last 12 days.  Otherwise Ronnie Jones  has been giving artificial nutrition support through his core track tube.  Discussed that a core track is not a long-term solution and we really need to think about the bigger picture and if Ronnie Jones would or would not desire a gastrostomy tube.  Discussed the risks and benefits of this.  Shared that it is an important conversation he and his wife need to have.  Broached the topic of cardiopulmonary resuscitation as well which is also something that they  are going to discuss amongst 1 another.  Provided "hard choices for loving people" booklet for review as well as a MOST form.  Provided a copy of advance directives for review and completion.  Decision Maker: Ronnie Jones (spouse) [phone]  SUMMARY OF RECOMMENDATIONS   FULL CODE --> Ongoing conversations  Discussed the idea of a gastrostomy tube and endorsed to Ronnie Jones its an important conversation for he and his wife to have offered information regarding this as well as time for family to ask questions  Discussed patient's difficulty contending with his stroke and the loss of his functional ability, asked if he would be willing to take low-dose antidepressants to help with this which he is amenable to --> mirtazapine started  Ongoing palliative care support  Code Status/Advance Care Planning: FULL CODE  Palliative Prophylaxis:  Aspiration, Bowel Regimen, Delirium Protocol, Frequent Pain Assessment, Oral Care, Palliative Wound Care, and Turn Reposition  Additional Recommendations (Limitations, Scope, Preferences): Continue current scope of care  Psycho-social/Spiritual:  Desire for further Chaplaincy support: No Additional Recommendations: Education on post stroke effects   Prognosis: High 65-monthmortality risk in the setting of multiple comorbidities and frailty  Discharge Planning: Discharge plan is uncertain at this time though I suspect he will likely go home.  Vitals:   01/13/22 0853 01/13/22 1525  BP:  126/63  Pulse: (!) 102 92  Resp: 20 (!) 22  Temp:  98.4 F (36.9 C)  SpO2: 96% 93%    Intake/Output Summary (Last 24 hours) at 01/13/2022 1553 Last data filed at 01/13/2022 1456 Gross per 24 hour  Intake 5595.33 ml  Output 1550 ml  Net 4045.33 ml   Last Weight  Most recent update: 01/13/2022  5:24 AM    Weight  73.9 kg (162 lb 14.7 oz)            Gen: Older Caucasian male in no acute distress HEENT: moist mucous membranes CV: Irregular rate and  rhythm PULM: On room air in no acute distress ABD: soft/nontender EXT: No edema Neuro: Alert and oriented x3 -slow in response  PPS: 30-40%   This conversation/these recommendations were discussed with patient primary care team, Dr. KLetta Pate Billing based on MDM: High  Problems Addressed: One acute or chronic illness or injury that poses a threat to life or bodily function  Amount and/or Complexity of Data: Category 3:Discussion of management or test interpretation with external physician/other qualified health care professional/appropriate source (not separately reported)  Risks: Decision not to resuscitate or to de-escalate care because of poor prognosis ______________________________________________________ [facility] Team Cell Phone: [phone] utilize secure chat with additional questions, if there is no response within 30 minutes please call the above phone number  Palliative Medicine Team providers are available by phone from 7am to 7pm daily and can be reached through the team cell phone.  Should this patient require assistance outside of these hours, please call the patient's attending physician.

## 2022-01-13 NOTE — Progress Notes (Signed)
PROGRESS NOTE   Subjective/Complaints:   Sitting at EOB with max A , per OT more fatigued , discussed sleep with RN, no sleep graph Reviewed Imaging plus lab work   ROS: limited by cognition   Objective:   DG Chest 2 View  Result Date: 01/11/2022 CLINICAL DATA:  Shortness of breath. EXAM: CHEST - 2 VIEW COMPARISON:  AP chest 01/09/2022, 01/08/2022, 01/07/2022; chest two views 12/31/2021; CT chest 05/29/2018; CT abdomen and pelvis 01/09/2022 FINDINGS: Cardiac silhouette is again mildly enlarged. Moderate calcifications are again seen within the aortic arch. Enteric tube descends below the diaphragm with the tip excluded by collimation. Improved aeration of the left lung base compared to most recent 01/09/2022 frontal radiograph. Minimal blunting of the posterior costophrenic angle on lateral view likely relates to the small left-greater-than-right pleural effusions previously seen on 01/09/2022 CT abdomen and pelvis. No significant airspace consolidation is seen. No pneumothorax. Mild-to-moderate multilevel degenerative disc changes of the thoracic spine. IMPRESSION: 1. Improved aeration of the lung bases compared to 01/09/2022 frontal radiograph. 2. Trace pleural effusions on lateral view. No significant lung consolidation. Electronically Signed   By: Yvonne Kendall M.D.   On: 01/11/2022 13:49   CT HEAD WO CONTRAST (5MM)  Result Date: 01/12/2022 CLINICAL DATA:  Intracranial hemorrhage, follow-up EXAM: CT HEAD WITHOUT CONTRAST TECHNIQUE: Contiguous axial images were obtained from the base of the skull through the vertex without intravenous contrast. RADIATION DOSE REDUCTION: This exam was performed according to the departmental dose-optimization program which includes automated exposure control, adjustment of the mA and/or kV according to patient size and/or use of iterative reconstruction technique. COMPARISON:  01/01/2022 FINDINGS: Brain:  Significantly decreased hyperdense hemorrhage centered within the left thalamus and adjacent white matter. Residual edema and mild mass effect. Decreased layering intraventricular hemorrhage. No new hemorrhage. No new loss of gray-white differentiation. Stable findings of probable chronic microvascular ischemic changes. Prominence of the ventricles and sulci reflects stable parenchymal volume loss. Vascular: No new finding. Skull: Calvarium is unremarkable. Sinuses/Orbits: No acute finding. Other: None. IMPRESSION: Expected evolution of left thalamic hemorrhage.  No new findings. Electronically Signed   By: Macy Mis M.D.   On: 01/12/2022 16:15   VAS [place] UPPER EXTREMITY VENOUS DUPLEX  Result Date: 01/11/2022 UPPER VENOUS STUDY  Patient Name:  Ronnie Jones  Date of Exam:   01/11/2022 Medical Rec #: [id number]       Accession #:    [id number] Date of Birth: [birth year]      Patient Gender: M Patient Age:   71 years Exam Location:  [facility] Procedure:      VAS [place] UPPER EXTREMITY VENOUS DUPLEX Referring Phys: Alysia Penna --------------------------------------------------------------------------------  Indications: Follow up right brachial DVT Limitations: Patient positioning/contracted and in pain. Comparison Study: 01-01-2022 Right upper extremity venous was positive for                   brachial DVT and basilic SVT. Performing Technologist: Darlin Coco RDMS, RVT  Examination Guidelines: A complete evaluation includes B-mode imaging, spectral Doppler, color Doppler, and power Doppler as needed of all accessible portions of each vessel. Bilateral testing is considered an integral part of a complete  examination. Limited examinations for reoccurring indications may be performed as noted.  Right Findings: +----------+------------+---------+-----------+----------+-------+ RIGHT     CompressiblePhasicitySpontaneousPropertiesSummary  +----------+------------+---------+-----------+----------+-------+ IJV           Full       Yes       Yes                      +----------+------------+---------+-----------+----------+-------+ Subclavian               Yes       Yes                      +----------+------------+---------+-----------+----------+-------+ Axillary      Full       Yes       Yes                      +----------+------------+---------+-----------+----------+-------+ Brachial      Full       Yes       Yes                      +----------+------------+---------+-----------+----------+-------+ Radial        Full                                          +----------+------------+---------+-----------+----------+-------+ Ulnar         Full                                          +----------+------------+---------+-----------+----------+-------+ Cephalic      Full                                          +----------+------------+---------+-----------+----------+-------+ Basilic       Full                                          +----------+------------+---------+-----------+----------+-------+  Left Findings: +----+------------+---------+-----------+----------+-------+ LEFTCompressiblePhasicitySpontaneousPropertiesSummary +----+------------+---------+-----------+----------+-------+ IJV     Full       Yes       Yes                      +----+------------+---------+-----------+----------+-------+  Summary:  Right: No evidence of deep vein thrombosis in the upper extremity. No evidence of superficial vein thrombosis in the upper extremity. Previously visualized right brachial and basilic DVT appears improved on today's examination.  Left: No evidence of thrombosis in the subclavian.  *See table(s) above for measurements and observations.  Diagnosing physician: Orlie Pollen Electronically signed by Orlie Pollen on 01/11/2022 at 7:05:01 PM.    Final     Recent Labs     01/11/22 0606  WBC 10.6*  HGB 11.4*  HCT 33.1*  PLT 181    Recent Labs    01/11/22 0606 01/12/22 0517  NA 129* 129*  K 5.5* 4.6  CL 97* 98  CO2 23 20*  GLUCOSE 172* 147*  BUN 34* 43*  CREATININE 1.40* 1.47*  CALCIUM 8.8* 8.7*     Intake/Output Summary (Last 24  hours) at 01/13/2022 0740 Last data filed at 01/13/2022 0400 Gross per 24 hour  Intake 5530.33 ml  Output 1200 ml  Net 4330.33 ml         Physical Exam: Vital Signs Blood pressure (!) 144/69, pulse (!) 104, temperature 98.6 F (37 C), temperature source Oral, resp. rate 20, height 5' 7"  (1.702 m), weight 73.9 kg, SpO2 93 %.   General: No acute distress Mood and affect are appropriate Heart: Regular rate and rhythm no rubs murmurs or extra sounds Lungs: Clear to auscultation, breathing unlabored, no rales or wheezes Abdomen: Positive bowel sounds, soft nontender to palpation, nondistended Extremities: No clubbing, cyanosis, or edema Skin: No evidence of breakdown, no evidence of rash  Neuro: Cranial nerves II through XII intact, alert and oriented x3 mild dysarthria, follows simple commands..  motor strength is 5 out of 5 in left and 0 of 5 right deltoid, bicep, tricep, trace grip, hip flexor, knee extensors, ankle dorsiflexor and plantar flexor   Assessment/Plan: 1. Functional deficits which require 3+ hours per day of interdisciplinary therapy in a comprehensive inpatient rehab setting. Physiatrist is providing close team supervision and 24 hour management of active medical problems listed below. Physiatrist and rehab team continue to assess barriers to discharge/monitor patient progress toward functional and medical goals  Care Tool:  Bathing  Bathing activity did not occur: Refused Body parts bathed by patient: Face, Left upper leg, Abdomen, Chest, Right arm   Body parts bathed by helper: Left arm, Front perineal area, Buttocks, Left upper leg, Right lower leg, Left lower leg     Bathing assist  Assist Level: Moderate Assistance - Patient 50 - 74%     Upper Body Dressing/Undressing Upper body dressing   What is the patient wearing?: Pull over shirt    Upper body assist Assist Level: Maximal Assistance - Patient 25 - 49%    Lower Body Dressing/Undressing Lower body dressing      What is the patient wearing?: Pants, Incontinence brief     Lower body assist Assist for lower body dressing: Total Assistance - Patient < 25%     Toileting Toileting    Toileting assist Assist for toileting: Total Assistance - Patient < 25%     Transfers Chair/bed transfer  Transfers assist     Chair/bed transfer assist level: Maximal Assistance - Patient 25 - 49% (squat pivot)     Locomotion Ambulation   Ambulation assist   Ambulation activity did not occur: Safety/medical concerns  Assist level: 2 helpers Assistive device: Other (comment) (L hallway rail) Max distance: 57f   Walk 10 feet activity   Assist  Walk 10 feet activity did not occur: Safety/medical concerns        Walk 50 feet activity   Assist Walk 50 feet with 2 turns activity did not occur: Safety/medical concerns         Walk 150 feet activity   Assist Walk 150 feet activity did not occur: Safety/medical concerns         Walk 10 feet on uneven surface  activity   Assist Walk 10 feet on uneven surfaces activity did not occur: Safety/medical concerns         Wheelchair     Assist Is the patient using a wheelchair?: Yes Type of Wheelchair: Manual    Wheelchair assist level: Dependent - Patient 0% Max wheelchair distance: 150    Wheelchair 50 feet with 2 turns activity    Assist  Assist Level: Dependent - Patient 0%   Wheelchair 150 feet activity     Assist      Assist Level: Dependent - Patient 0%   Blood pressure (!) 144/69, pulse (!) 104, temperature 98.6 F (37 C), temperature source Oral, resp. rate 20, height 5' 7"  (1.702 m), weight 73.9 kg,  SpO2 93 %.   Medical Problem List and Plan: 1. 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 Formulized individual treatment plan based on medical history, underlying problem and comorbidities.              -6/21 dc expected, Mod A to sup A 2.  Antithrombotics: -DVT/anticoagulation:  Pharmaceutical: d/c anticoaguluation hx ICH, DVT resolved              5/27- R brachial DVT- resolved on vascular imaging 01/11/22 3. Pain Management: Tylenol as needed 4. Mood: LCSW to evaluate and provide emotional support             -antipsychotic agents: n/a 5. Neuropsych: This patient is capable of making decisions on his own behalf. 6. Skin/Wound Care: Routine skin care checks 7. Fluids/Electrolytes/Nutrition: Routine Is and Os and follow-up chemistries             --dysphagia 1; honey thick- Has Cortrak in place and receiving TF's.  6/4 continue TF, Coretrak in place 8: T2DM: Hgb A1c- metformin on hold due to AKI/variable intake (puree diet). --Monitor BS ac/hs and use SSI for elevated BS. --Change TF to 6 pm-6 am CBG (last 3)  Recent Labs    01/12/22 1651 01/12/22 2045 01/13/22 0614  GLUCAP 122* 167* 184*   Controlled 6/7 9: Hypertension: Off Lisinopril due to AKI/hyperkalemia  --continue amlodipine, atenolol, hydralazine Vitals:   01/12/22 2005 01/13/22 0402  BP: 132/67 (!) 144/69  Pulse: 95 (!) 104  Resp: 16 20  Temp: 98.4 F (36.9 C) 98.6 F (37 C)  SpO2: 94% 93%   6/7 on cardura 3m qhs, amlodipine 581mqd, Hydralazine 10022mID , will increase amlodipine and increase amlodipine  10:   COPD: Continue Anoro Ellipta daily.  Flutter valve qid. Smoked 2ppd - CXR OK  --now off prednisone, no wheezing . --Duoneb prn   11: Alcohol abuse: cessation counseling. Off phenobarb. Check Thiamine level.  --continue B12, thiamine and folic acid 12: Tobacco abuse: cessation counseling --continue nicotine patch 13: Hyperlipidemia: LDL- 192 and Trig- 499-->continue Crestor 40 mg 14: AKI may bhave CKD : Baseline SCr-?~1.36 --Encourage --drinking honey liquids but not touching puree's.              --continue water flushes qid via cortak -Creat bumped up to 1.97- cont H2O flushes 200m12m4 Na+ 134, hesitate increasing this , alb a little low but doubt significant 3rd spacing -Cr fluctuating 1.4-1.5 range     Latest Ref Rng & Units 01/12/2022    5:17 AM 01/11/2022    6:06 AM 01/08/2022    5:37 AM  BMP  Glucose 70 -  99 mg/dL 147   172   259    BUN 8 - 23 mg/dL 43   34   40    Creatinine 0.61 - 1.24 mg/dL 1.47   1.40   1.55    Sodium 135 - 145 mmol/L 129   129   133    Potassium 3.5 - 5.1 mmol/L 4.6   5.5   4.5    Chloride 98 - 111 mmol/L 98   97   101    CO2 22 - 32 mmol/L 20   23   23     Calcium 8.9 - 10.3 mg/dL 8.7   8.8   8.5      15. Vitamin C deficiency: On daily supplement 16. Vitamin D deficiency: Continue weekly supplement.   17. Spasticity- overall improving  On tizanidine 14m TID - may be contributing to tiredness - this was d/ced but pt remains somnolent most likely CVA related  18. RUE swelling/weeping- resolved, RIght brachial DVT seen on vascular [place],repeat showing resolution  19.  Abd distension - Likely TF related trying nepro , CT abd pelvis showed retroperitoneal bleed in right psoas muscle no sig drop in hgb   20.  Urinary retention requiring I/O cath, alpha blocker   LOS: 13 days A FACE TO FMowerE Ronnie Jones 01/13/2022, 7:40 AM

## 2022-01-13 NOTE — Progress Notes (Signed)
Speech Language Pathology Daily Session Note  Patient Details  Name: Wright Gravely MRN: [id number] Date of Birth: August 02, 1951  Today's Date: 01/13/2022 SLP Individual Time: 5537-4827 SLP Individual Time Calculation (min): 29 min  Short Term Goals: Week 2: SLP Short Term Goal 1 (Week 2): Patient will consume current diet without overt s/sx of aspiration with min-to-mod A verbal cues for use of swallowing compensatory strategies SLP Short Term Goal 2 (Week 2): Patient will be oriented x4 with min-to-mod A for use of external aids SLP Short Term Goal 3 (Week 2): Patient will complete basic problem solving with mod A verval/visual cues SLP Short Term Goal 4 (Week 2): Patient will implement speech intelligbility strategies at the word level with min-tomod A verbal cues SLP Short Term Goal 5 (Week 2): Patient will exhibit recall of recent information with mod A verbal/visual cues for use of external aids  Skilled Therapeutic Interventions:Skilled ST services focused on swallow and cognitive skills. Pt demonstrated labor breathing, NT and SLP assessed O2 status, 93% oxygen. SLP applied nasal canula increasing to 99% saturation. Pt was orientated place and situation requiring mod A visual cues for time. Pt was agreeable to 1/4 a cup of pureed textures only, in which pt demonstrated appropriate oral clearance, timely swallow and no overt s/s aspiration. SLP recommends considering alternative means of nutrition due to poor PO intake. Pt was left in room with call bell within reach and bed alarm set. SLP recommends to continue skilled services.     Pain Pain Assessment Pain Score: 0-No pain  Therapy/Group: Individual Therapy  Ellis Koffler  [facility] 01/13/2022, 2:29 PM

## 2022-01-13 NOTE — Patient Care Conference (Signed)
Inpatient RehabilitationTeam Conference and Plan of Care Update Date: 01/13/2022   Time: 10:06 AM    Patient Name: Ronnie Jones      Medical Record Number: [id number]  Date of Birth: [birth year] Sex: Male         Room/Bed: 4M08C/4M08C-01 Payor Info: Payor: [employer] MEDICARE / Plan: [employer] MEDICARE HMO/PPO / Product Type: *No Product type* /    Admit Date/Time:  12/31/2021  5:31 PM  Primary Diagnosis:  Intraparenchymal hemorrhage of brain [facility])  Hospital Problems: Principal Problem:   Intraparenchymal hemorrhage of brain (HCC) Active Problems:   Aspiration pneumonia (HCC)   COPD with acute exacerbation (HCC)   Dysphagia   ICH (intracerebral hemorrhage) [facility])    Expected Discharge Date: Expected Discharge Date: 01/27/22  Team Members Present: Physician leading conference: Dr. Claudette Laws Social Worker Present: Lavera Guise, BSW Nurse Present: Chana Bode, RN PT Present: Grier Rocher, PT OT Present: Annye English, OT SLP Present: Colin Benton, SLP PPS Coordinator present : Edson Snowball, PT     Current Status/Progress Goal Weekly Team Focus  Bowel/Bladder     Incontinent of bowel, requires I+O cath q 4 hours   Continent of bowel and bladder   Monitor need for cath/retention and toilet patient  Swallow/Nutrition/ Hydration   dys 2 textures and honey thick liquids, Max A PO  sup A - will downgrade next week  PEG placement and tolerance of dys 2 textures and honey thick for pleasure   ADL's   +2 toileting with use of stedy or bed level, LBD, max A LB bathing at sink, UB bathing and dressing, mod A UB bathing, min A grooming/self-feeding; more lethargic/fatigued/poor initiation since last week  will have to downgrade to mod - max of 1  RUE NMR, transfer/balance/self-care retraining, pt/family/AE/DME education   Mobility   moderate regression in mobility since last week. max assist +2 for all mobility this week  mod assist overall for transfers and bed mobility.   family education. transfers, midline orientation. balance, motor planning   Communication   Mod A  sup a - will likely downgrade next week  speech strategies at word and phrase   Safety/Cognition/ Behavioral Observations  mod-max A  sup A - will likely downgrade next week  oreintation, problem solving and awareness   Pain     Abd pain   Pain managed   Monitor need for pain medication and effectiveness of prns  Skin     N/a          Discharge Planning:  PATIENT UNABLE TO DISCHARGE TO SNF DUE TO INSURANCE. Patient will discharge home with the spouse to provide up to MOD A. SW will inform spouse family education needs to be started.   Team Discussion: Patient post large CVA. Noted patient sleeps but does not feel rested; sleep chart monitoring. DM and BP are controlled. Previous smoking hx with COPD.  Patient with poor po intake, cortrak in with continuous TF to meet nutritional needs.  Patient on target to meet rehab goals: no, currently with poor initiation, right side pusher, LE buckling. Patient has poor awareness and problem solving and needs mod - max assist for orientation. Currently on D2 Honey thick diet but not eating; PEG placement recommended by SLP and dietary.  Requires mod - max +2 for transfers and ADLs.   *See Care Plan and progress notes for long and short-term goals.   Revisions to Treatment Plan:  Palliative Care Consult Downgraded goals for OT and SLP  Teaching Needs: Safety, transfers, toileting, medication management, nutritional means management, dietary modifications, etc  Current Barriers to Discharge: Decreased caregiver support, Home enviroment access/layout, and Incontinence and nutritional means and lack of insurance coverage for SNF  Possible Resolutions to Barriers: Education with wife HH follow up services     Medical Summary Current Status: poor tolerance of therapy , COPD requiring frequnt nebs, severe fatigue  Barriers to Discharge: Medical  stability;Other (comments)  Barriers to Discharge Comments: large CVA with little recovery of motor function Possible Resolutions to [employer] Focus: ritalin trial , BP management , COPD management reduce therapy to 15/7   Continued Need for Acute Rehabilitation Level of Care: The patient requires daily medical management by a physician with specialized training in physical medicine and rehabilitation for the following reasons: Direction of a multidisciplinary physical rehabilitation program to maximize functional independence : Yes Medical management of patient stability for increased activity during participation in an intensive rehabilitation regime.: Yes Analysis of laboratory values and/or radiology reports with any subsequent need for medication adjustment and/or medical intervention. : Yes   I attest that I was present, lead the team conference, and concur with the assessment and plan of the team.   Chana Bode B 01/13/2022, 5:05 PM

## 2022-01-13 NOTE — Progress Notes (Signed)
Patient ID: Ronnie Jones, male   DOB: Mar 23, 1951, 71 y.o.   MRN: [id number] Team Conference Report to Patient/Family  Team Conference discussion was reviewed with the patient and caregiver, including goals, any changes in plan of care and target discharge date.  Patient and caregiver express understanding and are in agreement.  The patient has a target discharge date of 01/27/22.  Sw spoke with patient spouse, [name] and provided team conference updates. Sw informed patient spouse about the LOC patient will require at discharge. Spouse reports that she will attempt to be present for meals due to observing the patient eating slightly more with her. Patient spouse scheduled family education on 6/15 and 6/16 9AM-12PM.    Dyanne Iha 01/13/2022, 1:14 PM

## 2022-01-14 DIAGNOSIS — Z66 Do not resuscitate: Secondary | ICD-10-CM

## 2022-01-14 LAB — GLUCOSE, CAPILLARY
Glucose-Capillary: 195 mg/dL — ABNORMAL HIGH (ref 70–99)
Glucose-Capillary: 207 mg/dL — ABNORMAL HIGH (ref 70–99)
Glucose-Capillary: 190 mg/dL — ABNORMAL HIGH (ref 70–99)
Glucose-Capillary: 176 mg/dL — ABNORMAL HIGH (ref 70–99)

## 2022-01-14 NOTE — Progress Notes (Signed)
Physical Therapy Session Note  Patient Details  Name: Ronnie Jones MRN: [id number] Date of Birth: 1950/09/18  Today's Date: 01/14/2022 PT Individual Time: 0945-1050 PT Individual Time Calculation (min): 65 min   Short Term Goals: Week 2:  PT Short Term Goal 1 (Week 2): Pt will transfer with mod assist consistently without lift PT Short Term Goal 2 (Week 2): Pt wil will ambulate 6ft with LRAD and max assist of 1 PT Short Term Goal 3 (Week 2): Pt will propell WC 144ft with min assist  Skilled Therapeutic Interventions/Progress Updates:    Pt received supine in bed asleep and upon awakening agreeable to therapy session with encouragement. Pt noted to have labored breathing even at rest. Vitals assessed: SpO2 86% and HR 90bpm. Nurse notified and donned 1L of O2 via nasal cannula to maintain SpO2 >94% per the orders - SpO2 recovers to 94% in <32minute. Maintained 1L of O2 throughout session with SpO2 sustaining >94%.  Supine>sitting R EOB with +2 max assist for B LE management and trunk upright. Sitting EOB, requires continuinous max assist for sitting balance due to R posterior trunk lean/LOB with poor ability to initiate balance recovery and anterior trunk activation. Donned tennis shoes and R LE [place] Walk-on Reaction GRAFO total assist.  L squat pivot transfer EOB>w/c with heavy max assist of 1 and +2 min assist for lifting/pivoting hips and maintaining trunk control due to continued R lateral LOB - requires total assist to position R LE during transfer with therapist blocking R knee to promote WBing.     Transported to/from gym in w/c for time management and energy conservation.  R squat pivot to EOM with again heavy max assist of 1 and +2 min assist as described above.  Pt tolerated sitting on EOM for ~2minutes during session working on trunk activation with pt continuing to have poor sitting balance requiring continuous min/mod assist to maintain upright with cuing and facilitation to  weight shift L using L UE support to prevent R posterior LOB.   Pt continues to have very labored breathing throughout session requiring frequent and prolonged supported rest breaks.   Sit>stand EOM>B HHA x3 (R UE support over therapist's shoulders) with heavy max assist of 1 and +2 mod assist - total assist to block R knee while standing with limited to no quad activation palpated today. Pt only able to tolerate standing ~20seconds each bout with +2 max assist to maintain upright although still with forward flexed trunk with significant R lean bias despite attempts at cuing and facilitation to improve.  SpO2 95% and HR 93bpm after standing.   Notified [name], PA of pt's SpO2 and she updated pt's orders to maintain SpO2 >88% - therapist notified the nurse and removed pt's O2 as he was saturating at that on room air.  L squat pivot EOM>w/c with max assist of 1 and +2 mod assist at this time due to increasing fatigue and pt appearing to push with L hand rather than pull hips towards seat.  Transported back to room and pt requesting to return to bed.   R squat pivot as described above. Sit>supine with +2 mod assist for trunk descent and R hemibody management onto bed. Pt left supine in bed with needs in reach, his wife present, R hemibody supported for improved positioning, and bed alarm on.   Therapy Documentation Precautions:  Precautions Precautions: Fall Precaution Comments: dense R hemiparesis Restrictions Weight Bearing Restrictions: No   Pain:  Pt continues to state he feels "  terrible" but no specific complaints of pain during session.    Therapy/Group: Individual Therapy  Tawana Scale , PT, DPT, NCS, CSRS 01/14/2022, 7:57 AM

## 2022-01-14 NOTE — Progress Notes (Signed)
Recreational Therapy Session Note  Patient Details  Name: Ronnie Jones MRN: [id number] Date of Birth: 19-Aug-1950 Today's Date: 01/14/2022  Pain: no c/o  Pt participated in animal assisted activity bed level using LUE to pet the dog.  Wife present and participatory as well.  Both appreciative.    [place] 01/14/2022, 12:03 PM

## 2022-01-14 NOTE — Progress Notes (Signed)
Occupational Therapy Session Note  Patient Details  Name: Ronnie Jones MRN: [id number] Date of Birth: 23-Feb-1951  Today's Date: 01/14/2022 OT Individual Time: 0488-8916 OT Individual Time Calculation (min): 70 min + 41 min   Short Term Goals: Week 1:  OT Short Term Goal 1 (Week 1): Pt will report for 2 consecutive sessions < 3/10 pain OT Short Term Goal 1 - Progress (Week 1): Met OT Short Term Goal 2 (Week 1): Pt will sit EOB at least 3 mins during ADL task to promote OOB tolerance OT Short Term Goal 2 - Progress (Week 1): Met OT Short Term Goal 3 (Week 1): Pt will complete 1 step of dressing task at bed level with no more than min verbal cues OT Short Term Goal 3 - Progress (Week 1): Met Week 2:  OT Short Term Goal 1 (Week 2): Pt will don shirt with mod A. OT Short Term Goal 2 (Week 2): Pt will don pants with mod A. OT Short Term Goal 3 (Week 2): Pt will complete >3 grooming tasks at sink with S.  Skilled Therapeutic Interventions/Progress Updates:    Session 1 (9450-3888): Pt received semi-reclined in bed, reports sleeping like "shit", but, agreeable to therapy with encouragement. Session focus on self-care retraining, activity tolerance, RUE/RLE NMR, bed mobility in prep for improved ADL/IADL/func mobility performance + decreased caregiver burden. Guided throughout 10 breaths on IS.  Total A at bed level to don pants + R wrist cock up. Pt able to roll R with min A, and able to bridge to assist as pt pulled pants up.   Pt completed 2x5 bridges, hip abduction and adduction with use of pillow in between knees. PROM and retrograde massage to RUE to facilitate decrease in dependent edema. MD in/out morning assessment.  Came to sitting EOB with max A. Max A to maintain static sitting balance due to R lean, but intermittent moments of min A. Total A to don shirt and mod A to apply deodorant. Maintained sitting balance EOB with mod to max A for ~8 min before pt requesting to lay down due to  fatigue. Pt able to push-up through BLE to assist in boosting.  After extended rest break, rolled to his L with max A of 2. Pt able to complete 3x5 elbow/flexion/extension with use of towel of side table, requires max A to extend and min A to flex.  Returned to sitting EOB for additional ~7 min with max A of 2 to come into sitting, min to max A of 1 for static sitting balance. Combed hair with support for balance. Total A of 2 to scoot up towards [facility] and returned to supine and boosted up with assist of 2.   SatO2 at 93% on RA throughout session.  Pt left with HOB at 30 degrees with bed alarm engaged, call bell in reach, and all immediate needs met.    Session 2 [phone]): Pt received semi-reclined in bed with wife/dietary services present, no reports of pain but, agreeable to therapy with encouragement. Session focus on transfer etraining, activity tolerance, RUE NMR in prep for improved ADL/IADL/func mobility performance + decreased caregiver burden. RN  present to disconnect TF.  Came to sitting EOB with heavy max A of 1, max A of 1 to support static sitting balance due to R pushing. Max A of 2 in stedy with multiple attempts to power up / maintain midline > TIS. Sustained two scrapes on RUE with bleeding, NT present to place bandage.  Pt completed 1x10 forward/L and R diagonal towel slides , total A on RUE. Attempted B forearm pulses to promote RUE WB, but pt with difficulty following commands/executing exercise despite multimodal cuing.  Squat-pivot with max A of 2 > bed on his R and to return to supine. SatO2 at 93% on RA throughout session.  Pt left with HOB over 30 degrees with Resp therapy and wife present with bed alarm engaged, call bell in reach, and all immediate needs met.     Therapy Documentation Precautions:  Precautions Precautions: Fall Precaution Comments: dense R hemiparesis Restrictions Weight Bearing Restrictions: No  Pain: no specific c/o throughout but  endorses fatigue   ADL: See Care Tool for more details.  Therapy/Group: Individual Therapy  Volanda Napoleon MS, OTR/L  01/14/2022, 6:52 AM

## 2022-01-14 NOTE — Plan of Care (Signed)
  Problem: Consults Goal: RH STROKE PATIENT EDUCATION Description: See Patient Education module for education specifics  Outcome: Progressing   Problem: RH BOWEL ELIMINATION Goal: RH STG MANAGE BOWEL WITH ASSISTANCE Description: STG Manage Bowel with min  Assistance. Outcome: Progressing Goal: RH STG MANAGE BOWEL W/MEDICATION W/ASSISTANCE Description: STG Manage Bowel with Medication with mod I Assistance. Outcome: Progressing   Problem: RH BLADDER ELIMINATION Goal: RH STG MANAGE BLADDER WITH ASSISTANCE Description: STG Manage Bladder With Assistance Outcome: Progressing Goal: RH STG MANAGE BLADDER WITH MEDICATION WITH ASSISTANCE Description: STG Manage Bladder With Medication With Assistance. Outcome: Progressing   Problem: RH SAFETY Goal: RH STG ADHERE TO SAFETY PRECAUTIONS W/ASSISTANCE/DEVICE Description: STG Adhere to Safety Precautions With cues Assistance/Device. Outcome: Progressing   Problem: RH PAIN MANAGEMENT Goal: RH STG PAIN MANAGED AT OR BELOW PT'S PAIN GOAL Description: Manage pain at or below level 4  Outcome: Progressing   Problem: RH KNOWLEDGE DEFICIT Goal: RH STG INCREASE KNOWLEDGE OF DIABETES Description: Patient and wife will be able to manage DM with medications and dietary modifications using handouts and educational materials independently Outcome: Progressing Goal: RH STG INCREASE KNOWLEDGE OF HYPERTENSION Description: Patient and wife will be able to manage HTN with medications and dietary modifications using handouts and educational materials independently Outcome: Progressing Goal: RH STG INCREASE KNOWLEDGE OF DYSPHAGIA/FLUID INTAKE Description: Patient and wife will be able to manage Dysphagia, medications and dietary modifications using handouts and educational materials independently Outcome: Progressing Goal: RH STG INCREASE KNOWLEGDE OF HYPERLIPIDEMIA Description: Patient and wife will be able to manage HLD with medications and dietary  modifications using handouts and educational materials independently Outcome: Progressing   Problem: RH Vision Goal: RH LTG Vision (Specify) Outcome: Progressing

## 2022-01-14 NOTE — Progress Notes (Signed)
Rn called to room, pt requesting PRN neb. O2 sat spot check at 93% on room air. Pt showing no signs of distress or difficulty breathing. Neb setup and started as ordered.

## 2022-01-14 NOTE — Progress Notes (Signed)
Palliative Medicine Inpatient Follow Up Note   HPI:  Mr. Rutledge is a 71 year old gentleman with a past medical history of ETOH use (at least 1 pint per day), COPD, HTN, tobacco use (2-3 packs per day), and vitamin D deficiency. 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 Has been in CIR for 12 days to rehabilitate. Palliative care has been asks to get involved to further aid in goals of care conversations. Patient is not thriving in rehabilitation and we have been asked to discuss PEG tube placement.   Today's Discussion 01/14/2022  *Please note that this is a verbal dictation therefore any spelling or grammatical errors are due to the "[place] [employer]" system interpretation.  Chart reviewed inclusive of vital signs, progress notes, laboratory results, and diagnostic images.   Dr. Marianna Payment and I met with Doren Custard this morning.  He was more alert and conversant than he had been on the prior day though shares with [place] that his sleeping has been very poor.   We asked Aidan if he had thought about the conversation from the prior day surrounding whether or not he would wish for a artificial gastrostomy tube.  He expressed needing more time to think about this and we agreed we will be by the room later on to meet with both he and his wife.  Provided empathy for all that Emoni has been through in the last month and created space and opportunity for patient to explore thoughts feelings and fears regarding current medical situation.  Therapeutic silence was utilized in an effort to allow Philipps self expression.  Unfortunately, he did not open up.  Provided  "Hard Choices for Loving People" booklet ___________________________________ Addendum:  At 11:00 this morning Dr. Marianna Payment and I met with Doren Custard and his wife, [name] at bedside.  A formal review of the conversation from yesterday was held in the setting of [facility] prolonged rehabilitation stay.  Discussed that Arnette Norris  has not been making great strides in rehab and if anything he has been plateauing.  Reviewed in area where he has made some improvements is with his dysphagia diet.  Chayden though expresses very little want need or desire to eat and drink.  A formal conversation regarding the risks and benefits of artificial gastrostomy tube placement was held.  Reviewed the risk of infection, recurrent/ongoing aspiration, dislodgment, and the procedure itself which would require potentially anesthesia.  Discussed the benefits of artificially prolonging life though not necessarily the quality of the patient's life.  A formal conversation was held again in the setting of cardiopulmonary resuscitation status given [place] severe COPD and CVA.  Reviewed that in the event of a cardiac arrest Arnette Norris would likely not come back in a better health state if he were resuscitated inclusive of CPR, ACLS medicines, shocks, and intubation that his overall condition would likely be far worse and more debilitated than it is presently.  Arnette Norris shares that he does not want an artificial gastrostomy tube nor would he want cardiopulmonary resuscitation.  We confirmed that that meant that he would likely have less time on earth which provided the ability to further discuss comfort measures and hospice care.  We reviewed that hospice care focuses on dignity and quality at the end of life.  We discussed that we except the disease process where it is at and no longer pursue curative measures rather pursue measures to enhance comfort. [place] asked for some time to further discuss this with his wife.  We agreed to meet again tomorrow for further conversations.  _________________________________ Addendum #2[name] met Dr. Marianna Payment and myself in the 4 E. waiting area to continue conversations on Chauncey condition.  She shares that when Arnette Norris transition to [place] he had expressed to her that he is "going to die".  At that time [name] was taken aback  though she does endorse a lack of improvement and that this could quite literally be the end of the line for him.  We reviewed that our goals are really to clarify information and bring the goals of the patient to fruition whether that be through aggressive modalities of care or through comfort mediated care.  Spent time listening to [name] expresses her relationship with Arnette Norris and how they have been together for 47 years.  [name] shares that given the extent of [place] situation she would appreciate input from her son as well.  We agreed to meet tomorrow to further discuss if hospice would be an appropriate next best step for [facility].  Questions and concerns addressed   Palliative Support Provided  Objective Assessment: Vital Signs Vitals:   01/14/22 1300 01/14/22 1344  BP: (!) 114/56   Pulse: 92 90  Resp: (!) 25 20  Temp: 98 F (36.7 C)   SpO2: 95%     Intake/Output Summary (Last 24 hours) at 01/14/2022 1637 Last data filed at 01/14/2022 1137 Gross per 24 hour  Intake 600 ml  Output 850 ml  Net -250 ml   Last Weight  Most recent update: 01/13/2022  5:24 AM    Weight  73.9 kg (162 lb 14.7 oz)            Gen: Older Caucasian male in no acute distress HEENT: moist mucous membranes CV: Irregular rate and rhythm PULM: On room air in no acute distress ABD: soft/nontender EXT: No edema Neuro: Alert and oriented x3 - slow in response  SUMMARY OF RECOMMENDATIONS   DNAR/DNI  Patient does not want a gastrostomy tube  Conversations are ongoing for regarded the potential for hospice care  Ongoing palliative care support  Time Spent: 26  Billing based on MDM: High  Problems Addressed: One acute or chronic illness or injury that poses a threat to life or bodily function  Amount and/or Complexity of Data: Category 3:Discussion of management or test interpretation with external physician/other qualified health care professional/appropriate source (not separately  reported)  Risks: Decision not to resuscitate or to de-escalate care because of poor prognosis ______________________________________________________________________________________ [place] Team Team Cell Phone: [phone] Please utilize secure chat with additional questions, if there is no response within 30 minutes please call the above phone number  Palliative Medicine Team providers are available by phone from 7am to 7pm daily and can be reached through the team cell phone.  Should this patient require assistance outside of these hours, please call the patient's attending physician.

## 2022-01-14 NOTE — Progress Notes (Signed)
PROGRESS NOTE   Subjective/Complaints:   Pt states he slept poorly but appears more alert this am .  Some breathing issues  no new cough or fever , no N/V/D last noc   ROS: limited by cognition   Objective:   CT HEAD WO CONTRAST ( )  Result Date: 01/12/2022 CLINICAL DATA:  Intracranial hemorrhage, follow-up EXAM: CT HEAD WITHOUT CONTRAST TECHNIQUE: Contiguous axial images were obtained from the base of the skull through the vertex without intravenous contrast. RADIATION DOSE REDUCTION: This exam was performed according to the departmental dose-optimization program which includes automated exposure control, adjustment of the mA and/or kV according to patient size and/or use of iterative reconstruction technique. COMPARISON:  01/01/2022 FINDINGS: Brain: Significantly decreased hyperdense hemorrhage centered within the left thalamus and adjacent white matter. Residual edema and mild mass effect. Decreased layering intraventricular hemorrhage. No new hemorrhage. No new loss of gray-white differentiation. Stable findings of probable chronic microvascular ischemic changes. Prominence of the ventricles and sulci reflects stable parenchymal volume loss. Vascular: No new finding. Skull: Calvarium is unremarkable. Sinuses/Orbits: No acute finding. Other: None. IMPRESSION: Expected evolution of left thalamic hemorrhage.  No new findings. Electronically Signed   By: Guadlupe Spanish M.D.   On: 01/12/2022 16:15    No results for input(s): "WBC", "HGB", "HCT", "PLT" in the last 72 hours.  Recent Labs    01/12/22 0517  NA 129*  K 4.6  CL 98  CO2 20*  GLUCOSE 147*  BUN 43*  CREATININE 1.47*  CALCIUM 8.7*     Intake/Output Summary (Last 24 hours) at 01/14/2022 0724 Last data filed at 01/14/2022 0701 Gross per 24 hour  Intake 75 ml  Output 2200 ml  Net -2125 ml         Physical Exam: Vital Signs Blood pressure 136/67, pulse 97, temperature  98.7 F (37.1 C), temperature source Oral, resp. rate (!) 22, height 5\' 7"  (1.702 m), weight 73.9 kg, SpO2 90 %.   General: No acute distress Mood and affect are appropriate Heart: Regular rate and rhythm no rubs murmurs or extra sounds Lungs: Clear to auscultation, breathing unlabored, no rales or wheezes Abdomen: Positive bowel sounds, soft nontender to palpation, nondistended Extremities: No clubbing, cyanosis, or edema Skin: No evidence of breakdown, no evidence of rash  Neuro: Cranial nerves II through XII intact, alert and oriented x3 mild dysarthria, follows simple commands..  motor strength is 5 out of 5 in left and 0 of 5 right deltoid, bicep, tricep, trace grip, hip flexor, knee extensors, ankle dorsiflexor and plantar flexor Some trace RIght hip add and shoulder add    Assessment/Plan: 1. Functional deficits which require 3+ hours per day of interdisciplinary therapy in a comprehensive inpatient rehab setting. Physiatrist is providing close team supervision and 24 hour management of active medical problems listed below. Physiatrist and rehab team continue to assess barriers to discharge/monitor patient progress toward functional and medical goals  Care Tool:  Bathing  Bathing activity did not occur: Refused Body parts bathed by patient: Face, Left upper leg, Abdomen, Chest, Right arm   Body parts bathed by helper: Left arm, Front perineal area, Buttocks, Left upper leg, Right lower leg, Left  lower leg     Bathing assist Assist Level: Moderate Assistance - Patient 50 - 74%     Upper Body Dressing/Undressing Upper body dressing   What is the patient wearing?: Pull over shirt    Upper body assist Assist Level: Maximal Assistance - Patient 25 - 49%    Lower Body Dressing/Undressing Lower body dressing      What is the patient wearing?: Pants, Incontinence brief     Lower body assist Assist for lower body dressing: Total Assistance - Patient < 25%      Toileting Toileting    Toileting assist Assist for toileting: Total Assistance - Patient < 25%     Transfers Chair/bed transfer  Transfers assist     Chair/bed transfer assist level: Maximal Assistance - Patient 25 - 49% (squat pivot)     Locomotion Ambulation   Ambulation assist   Ambulation activity did not occur: Safety/medical concerns  Assist level: 2 helpers Assistive device: Other (comment) (L hallway rail) Max distance: 69ft   Walk 10 feet activity   Assist  Walk 10 feet activity did not occur: Safety/medical concerns        Walk 50 feet activity   Assist Walk 50 feet with 2 turns activity did not occur: Safety/medical concerns         Walk 150 feet activity   Assist Walk 150 feet activity did not occur: Safety/medical concerns         Walk 10 feet on uneven surface  activity   Assist Walk 10 feet on uneven surfaces activity did not occur: Safety/medical concerns         Wheelchair     Assist Is the patient using a wheelchair?: Yes Type of Wheelchair: Manual    Wheelchair assist level: Dependent - Patient 0% Max wheelchair distance: 150    Wheelchair 50 feet with 2 turns activity    Assist        Assist Level: Dependent - Patient 0%   Wheelchair 150 feet activity     Assist      Assist Level: Dependent - Patient 0%   Blood pressure 136/67, pulse 97, temperature 98.7 F (37.1 C), temperature source Oral, resp. rate (!) 22, height 5\' 7"  (1.702 m), weight 73.9 kg, SpO2 90 %.   Medical Problem List and Plan: 1. 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   Antithrombotics: -DVT/anticoagulation:  Pharmaceutical: d/c anticoaguluation hx ICH, DVT resolved              5/27- R brachial DVT- resolved on vascular imaging 01/11/22 3. Pain Management: Tylenol as needed 4. Mood: LCSW to evaluate and provide emotional support             -antipsychotic agents: n/a 5. Neuropsych: This patient is capable of making decisions on his own behalf. 6. Skin/Wound Care: Routine skin care checks 7. Fluids/Electrolytes/Nutrition: Routine Is and Os and follow-up chemistries             --  dysphagia 1; honey thick- Has Cortrak in place and receiving TF's.  6/4 continue TF, Coretrak in place 8: T2DM: Hgb A1c- metformin on hold due to AKI/variable intake (puree diet). --Monitor BS ac/hs and use SSI for elevated BS. --Change TF to 6 pm-6 am CBG (last 3)  Recent Labs    01/13/22 1654 01/13/22 2137 01/14/22 0610  GLUCAP 166* 151* 195*   Controlled 6/8 9: Hypertension: Off Lisinopril due to AKI/hyperkalemia  --continue amlodipine, atenolol, hydralazine Vitals:   01/13/22 2058 01/14/22 0428  BP:  136/67  Pulse:  97  Resp:  (!) 22  Temp:  98.7 F (37.1 C)  SpO2: 91% 90%   6/7 on cardura 1mg  qhs, amlodipine 5mg  qd, Hydralazine 100mg  TID , will increase amlodipine to 10mg   and decrease hydralazine to 50 mg  10:  COPD: Continue Anoro Ellipta daily.  Flutter valve qid. Smoked 2ppd - CXR OK  --now off prednisone, no wheezing . --Duoneb prn   11: Alcohol abuse: cessation counseling. Off phenobarb. Check Thiamine level.  --continue B12, thiamine and folic acid 12: Tobacco abuse: cessation counseling --continue nicotine patch 13: Hyperlipidemia: LDL- 192 and Trig- 499-->continue Crestor 40 mg 14: AKI may bhave CKD : Baseline SCr-?~1.36 --Encourage --drinking honey liquids but not touching puree's.              --continue water flushes qid via cortak -Creat bumped up to 1.97- cont H2O flushes 200ml q 4 Na+ 134, hesitate increasing this , alb a little low but  doubt significant 3rd spacing -Cr fluctuating 1.4-1.5 range     Latest Ref Rng & Units 01/12/2022    5:17 AM 01/11/2022    6:06 AM 01/08/2022    5:37 AM  BMP  Glucose 70 - 99 mg/dL 161147  096172  045259   BUN 8 - 23 mg/dL 43  34  40   Creatinine 0.61 - 1.24 mg/dL 4.091.47  8.111.40  9.141.55   Sodium 135 - 145 mmol/L 129  129  133   Potassium 3.5 - 5.1 mmol/L 4.6  5.5  4.5   Chloride 98 - 111 mmol/L 98  97  101   CO2 22 - 32 mmol/L 20  23  23    Calcium 8.9 - 10.3 mg/dL 8.7  8.8  8.5     15. Vitamin C deficiency: On daily supplement 16. Vitamin D deficiency: Continue weekly supplement.   17. Spasticity- overall improving  On tizanidine 2mg  TID - may be contributing to tiredness - this was d/ced but pt remains somnolent most likely CVA related  18. RUE swelling/weeping- resolved, RIght brachial DVT seen on vascular US 5/26,repeat showing resolution  19.  Abd distension - Likely TF related trying nepro , CT abd pelvis showed retroperitoneal bleed in right psoas muscle no sig drop in hgb   20.  Urinary retention requiring I/O cath, alpha blocker , may need to go home with foley  LOS: 14 days A FACE TO FACE EVALUATION WAS PERFORMED  Erick Colacendrew E Sansa Alkema 01/14/2022, 7:24 AM

## 2022-01-15 ENCOUNTER — Inpatient Hospital Stay (HOSPITAL_COMMUNITY): Payer: Medicare HMO

## 2022-01-15 LAB — GLUCOSE, CAPILLARY
Glucose-Capillary: 197 mg/dL — ABNORMAL HIGH (ref 70–99)
Glucose-Capillary: 189 mg/dL — ABNORMAL HIGH (ref 70–99)
Glucose-Capillary: 149 mg/dL — ABNORMAL HIGH (ref 70–99)
Glucose-Capillary: 124 mg/dL — ABNORMAL HIGH (ref 70–99)

## 2022-01-15 IMAGING — DX DG ABD PORTABLE 1V
1 series · 1 of 1 positions shown · non-contrast
Comparison: Radiograph [DATE]

CLINICAL DATA: Feeding tube placement

EXAM:
PORTABLE ABDOMEN - 1 VIEW

[abdomen]
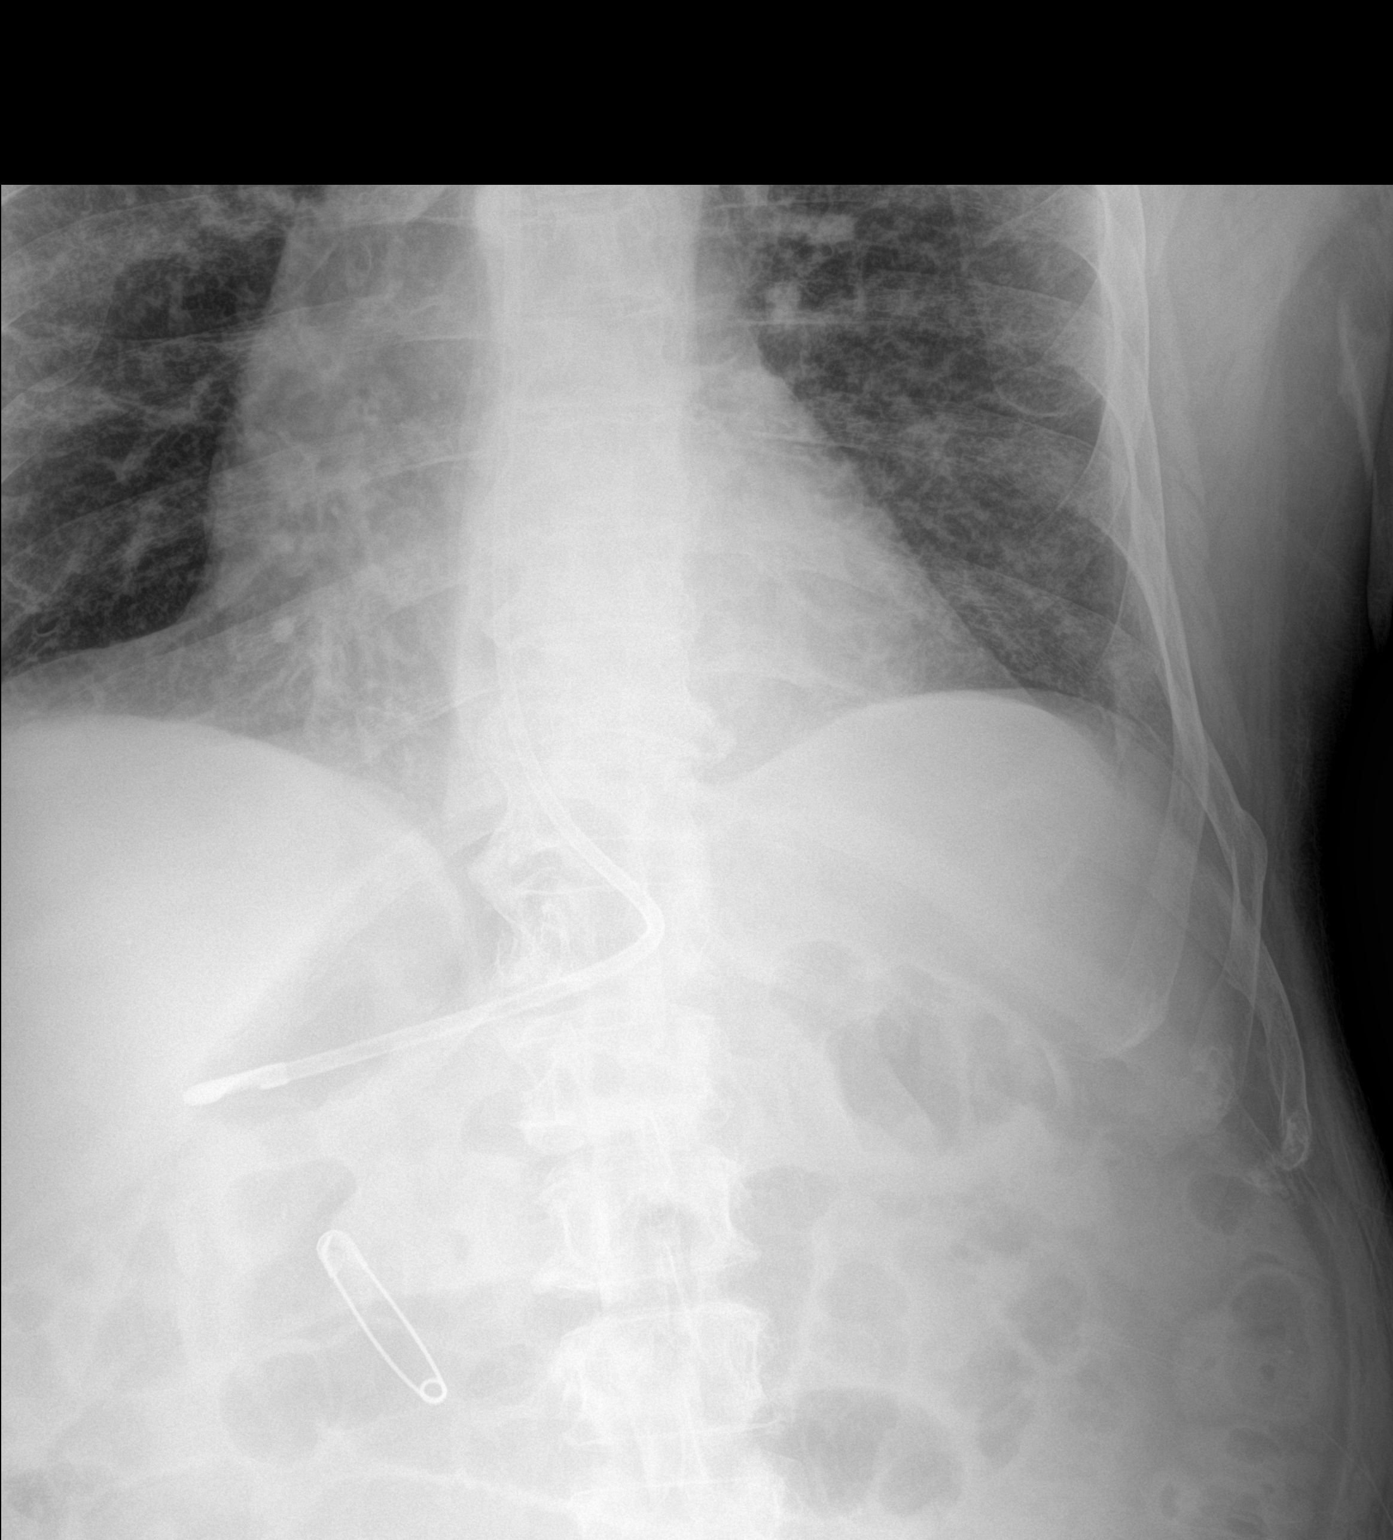

[1 of 1 positions shown; findings below may reference images not displayed]

FINDINGS: Feeding tube with tip position in the pyloric region the stomach.
IMPRESSION: Feeding tube with tip in the distal stomach.

## 2022-01-15 MED ORDER — SODIUM BICARBONATE 650 MG PO TABS
650.0000 mg | ORAL_TABLET | Freq: Once | ORAL | Status: AC
  Administered 2022-01-15: 650 mg via ORAL
  Filled 2022-01-15: qty 1

## 2022-01-15 MED ORDER — TEMAZEPAM 7.5 MG PO CAPS: 7.5000 mg | ORAL_CAPSULE | Freq: Every evening | ORAL | Status: DC | PRN

## 2022-01-15 NOTE — Progress Notes (Signed)
Occupational Therapy Session Note  Patient Details  Name: Ronnie Jones MRN: [id number] Date of Birth: 02/26/1951  Today's Date: 01/15/2022 OT Individual Time: 4383-8184 OT Individual Time Calculation (min): 32 min  and Today's Date: 01/15/2022 OT Missed Time: 15 Minutes Missed Time Reason: Patient fatigue   Short Term Goals: Week 1:  OT Short Term Goal 1 (Week 1): Pt will report for 2 consecutive sessions < 3/10 pain OT Short Term Goal 1 - Progress (Week 1): Met OT Short Term Goal 2 (Week 1): Pt will sit EOB at least 3 mins during ADL task to promote OOB tolerance OT Short Term Goal 2 - Progress (Week 1): Met OT Short Term Goal 3 (Week 1): Pt will complete 1 step of dressing task at bed level with no more than min verbal cues OT Short Term Goal 3 - Progress (Week 1): Met Week 2:  OT Short Term Goal 1 (Week 2): Pt will don shirt with mod A. OT Short Term Goal 2 (Week 2): Pt will don pants with mod A. OT Short Term Goal 3 (Week 2): Pt will complete >3 grooming tasks at sink with S.  Skilled Therapeutic Interventions/Progress Updates:    Pt received semi-reclined in bed, asleep but easily awakened to verbal/tactile cues. Breakfast tray untouched, pt persistently declining to eat/drink, but, agreeable to get dressed with encouragement Session focus on self-care retraining, activity tolerance, bed mobility in prep for improved ADL/IADL/func mobility performance + decreased caregiver burden.  Total A to don pants bed level, pt able to bridge with BLE with assist to hold feet in place. Came to sitting EOB with max A of 2, heavy max A of 1, intermittent assist of 2 to maintain static sitting balance. Washed hair with shampoo cap to improve mood/alertness, applied deodorant and swapped out hospital gown with total A of 2. Pt consistently falling posteriorally/to his R. Total A of 2 to return to side-lying then supine/boost up in bed.   Session terminated early due to pt fatigue, unable to  meaningfully participate in therapy at this time.   SatO2 at 93-96% on RA throughout session.   Pt left with HOB over 30 degrees with bed alarm engaged, call bell in reach, and all immediate needs met.    Therapy Documentation Precautions:  Precautions Precautions: Fall Precaution Comments: dense R hemiparesis Restrictions Weight Bearing Restrictions: No  Pain: reports having slept terrible, but unable to elaborate further   ADL: See Care Tool for more details.   Therapy/Group: Individual Therapy  Volanda Napoleon MS, OTR/L  01/15/2022, 6:49 AM

## 2022-01-15 NOTE — Progress Notes (Signed)
Speech Language Pathology Daily Session Note  Patient Details  Name: Ronnie Jones MRN: [id number] Date of Birth: Mar 04, 1951  Today's Date: 01/15/2022 SLP Individual Time: 1402-1440 SLP Individual Time Calculation (min): 38 min  Short Term Goals: Week 2: SLP Short Term Goal 1 (Week 2): Patient will consume current diet without overt s/sx of aspiration with min-to-mod A verbal cues for use of swallowing compensatory strategies SLP Short Term Goal 2 (Week 2): Patient will be oriented x4 with min-to-mod A for use of external aids SLP Short Term Goal 3 (Week 2): Patient will complete basic problem solving with mod A verval/visual cues SLP Short Term Goal 4 (Week 2): Patient will implement speech intelligbility strategies at the word level with min-tomod A verbal cues SLP Short Term Goal 5 (Week 2): Patient will exhibit recall of recent information with mod A verbal/visual cues for use of external aids  Skilled Therapeutic Interventions: Pt seen for skilled ST with focus on swallowing goals, observed awake in bed with wife present. Per wife, pt consumed very limited intake of Dys 2 lunch meal which was still present. SLP and wife providing MAX encouragement to increase PO at this time however patient refusing all suggestions and trials. SLP educating them on therapeutic trials of advanced textures at this time to increase PO, patient's wife states his favorite candy bar is 3 musketeers which SLP purchased from gift shop. Pt finally agreeable to take a bite of candy bar, single bite with timely mastication and apparent functional pharyngeal phase of swallow, refusing any further bites and wrapping candy bar up and moving it away. Pt observed with single sip of HTL via cup with delayed throat clear and wet vocal quality cleared with cued cough. Spoke with nursing who states Cortrak is currently clogged. Pt and wife endorse increased wet cough that is unproductive, educated them both of use of suction at  bedside if patient able to clear secretions. Pt left in bed with alarm set and wife present for needs. Cont ST POC.  Pain Pain Assessment Pain Scale: 0-10 Pain Score: 0-No pain  Therapy/Group: Individual Therapy  Tacey Ruiz 01/15/2022, 2:39 PM

## 2022-01-15 NOTE — Procedures (Signed)
Cortrak  Person Inserting Tube:  Ronnie Jones D, RD Tube Type:  Cortrak - 43 inches Tube Size:  10 Tube Location:  Right nare Secured by: Bridle Technique Used to Measure Tube Placement:  Marking at nare/corner of mouth Cortrak Secured At:  65 cm   Cortrak Tube Team Note:  Consult received to place a Cortrak feeding tube.   X-ray is required, abdominal x-ray has been ordered by the Cortrak team. Please confirm tube placement before using the Cortrak tube.   If the tube becomes dislodged please keep the tube and contact the Cortrak team at [email or web address] (password [email or web address]) for replacement.  If after hours and replacement cannot be delayed, place a NG tube and confirm placement with an abdominal x-ray.    Ronnie Jones, RD, LDN Clinical Dietitian RD pager # available in [place]  After hours/weekend pager # available in [facility]

## 2022-01-15 NOTE — Progress Notes (Signed)
Physical Therapy Session Note  Patient Details  Name: Ronnie Jones MRN: [id number] Date of Birth: 25-Jan-1951  Today's Date: 01/15/2022   Short Term Goals: Week 1:  PT Short Term Goal 1 (Week 1): Pt will tolerate sitting in WC >2 hours between therapy PT Short Term Goal 1 - Progress (Week 1): Met PT Short Term Goal 2 (Week 1): Pt will transfer to Bed with max assist of 1 consistently PT Short Term Goal 2 - Progress (Week 1): Met PT Short Term Goal 3 (Week 1): Pt will ambulate 64f with max assist + 2 PT Short Term Goal 3 - Progress (Week 1): Met Week 2:  PT Short Term Goal 1 (Week 2): Pt will transfer with mod assist consistently without lift PT Short Term Goal 2 (Week 2): Pt wil will ambulate 376fwith LRAD and max assist of 1 PT Short Term Goal 3 (Week 2): Pt will propell WC 10072fith min assist  Skilled Therapeutic Interventions/Progress Updates:      RN/dietition present in room to assess coretrack blockage. PT informed that core track needed to be replaced. Pt left in room with staff present. PT returned following placement of new core track and pt reports needing to rest from pain of tube placement. Does not rate. Left supine in bed with call bell in reach and all needs met.   Therapy Documentation Precautions:  Precautions Precautions: Fall Precaution Comments: dense R hemiparesis Restrictions Weight Bearing Restrictions: No General: PT Amount of Missed Time (min): 45 Minutes PT Missed Treatment Reason: Nursing care (Core track replacement) Vital Signs: Therapy Vitals Temp: 98 F (36.7 C) Temp Source: Oral Pulse Rate: 94 Resp: 19 BP: 135/65 Patient Position (if appropriate): Lying Oxygen Therapy SpO2: 94 % O2 Device: Room Air Pain: Pain Assessment Pain Scale: 0-10 Pain Score: 0-No pain   Therapy/Group: Individual Therapy  AusLorie Phenix9/2023, 5:53 PM

## 2022-01-15 NOTE — Progress Notes (Signed)
Cortrak clogged, attempts made to unclog w/ no success. Kirsteins/[name] Love paged, cortrak consult placed for replacement

## 2022-01-15 NOTE — Progress Notes (Signed)
   Palliative Medicine Inpatient Follow Up Note   HPI:  [name] is a 71 year old gentleman with a past medical history of ETOH use (at least 1 pint per day), COPD, HTN, tobacco use (2-3 packs per day), and vitamin D deficiency. 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 Has been in CIR for 12 days to rehabilitate. Palliative care has been asks to get involved to further aid in goals of care conversations. Patient is not thriving in rehabilitation and we have been asked to discuss PEG tube placement.   Today's Discussion 01/15/2022  *Please note that this is a verbal dictation therefore any spelling or grammatical errors are due to the "[employer]" system interpretation.  Chart reviewed inclusive of vital signs, progress notes, laboratory results, and diagnostic images.   I met with Estelle and his wife [name] this afternoon.  Ewell shares that overall he is not feeling incredibly well and has had poor sleep.  [name] notes that his appetite has been increasing since having initiated mirtazapine.  Reviewed the plan for [place] to work with physical therapy today.  I asked Arnette Norris and [name] if they had had the opportunity speak to Arnette Norris son regarding steps after he is hospitalized.  [name] shares she plans to speak to their son this weekend and discussed the idea of possible hospice.  If hospice is pursued it will likely be an [facility] when he completes his rehab at [facility].  Questions and concerns addressed   Palliative Support Provided  Objective Assessment: Vital Signs Vitals:   01/14/22 2100 01/15/22 0526  BP:  (!) 149/76  Pulse:  99  Resp:  20  Temp:  98.3 F (36.8 C)  SpO2: 92% 92%    Intake/Output Summary (Last 24 hours) at 01/15/2022 1400 Last data filed at 01/15/2022 0700 Gross per 24 hour  Intake 35 ml  Output 1350 ml  Net -1315 ml    Last Weight  Most recent update: 01/15/2022  5:27 AM    Weight  71.6 kg (157 lb 13.6 oz)             Gen: Older Caucasian male in no acute distress HEENT: moist mucous membranes CV: Irregular rate and rhythm PULM: On room air in no acute distress ABD: soft/nontender EXT: No edema Neuro: Alert and oriented x3 - slow in response  SUMMARY OF RECOMMENDATIONS   DNAR/DNI  Patient does not want a gastrostomy tube  Patient's wife plans to speak to their son this weekend regarding the idea of hospice care upon discharge  Ongoing palliative care support  Billing based on MDM: High ______________________________________________________________________________________ [place] Team Team Cell Phone: [phone] Please utilize secure chat with additional questions, if there is no response within 30 minutes please call the above phone number  Palliative Medicine Team providers are available by phone from 7am to 7pm daily and can be reached through the team cell phone.  Should this patient require assistance outside of these hours, please call the patient's attending physician.

## 2022-01-15 NOTE — Progress Notes (Signed)
PROGRESS NOTE   Subjective/Complaints:   Slept poorly but no other c/os  ROS: limited by cognition   Objective:   No results found.  No results for input(s): "WBC", "HGB", "HCT", "PLT" in the last 72 hours.  No results for input(s): "NA", "K", "CL", "CO2", "GLUCOSE", "BUN", "CREATININE", "CALCIUM" in the last 72 hours.   Intake/Output Summary (Last 24 hours) at 01/15/2022 0710 Last data filed at 01/15/2022 0530 Gross per 24 hour  Intake 625 ml  Output 1350 ml  Net -725 ml         Physical Exam: Vital Signs Blood pressure (!) 149/76, pulse 99, temperature 98.3 F (36.8 C), temperature source Oral, resp. rate 20, height 5\' 7"  (1.702 m), weight 71.6 kg, SpO2 92 %.   General: No acute distress Mood and affect are appropriate Heart: Regular rate and rhythm no rubs murmurs or extra sounds Lungs: Clear to auscultation, breathing unlabored, no rales or wheezes Abdomen: Positive bowel sounds, soft nontender to palpation, nondistended Extremities: No clubbing, cyanosis, or edema Skin: No evidence of breakdown, no evidence of rash  Neuro: Cranial nerves II through XII intact, alert and oriented x3 mild dysarthria, follows simple commands..  motor strength is 5 out of 5 in left and 0 of 5 right deltoid, bicep, tricep, trace grip, hip flexor, knee extensors, ankle dorsiflexor and plantar flexor Some trace RIght hip add and shoulder add  Tone MAS 2 at Right elbow flexors  Assessment/Plan: 1. Functional deficits which require 3+ hours per day of interdisciplinary therapy in a comprehensive inpatient rehab setting. Physiatrist is providing close team supervision and 24 hour management of active medical problems listed below. Physiatrist and rehab team continue to assess barriers to discharge/monitor patient progress toward functional and medical goals  Care Tool:  Bathing  Bathing activity did not occur: Refused Body parts  bathed by patient: Face, Left upper leg, Abdomen, Chest, Right arm   Body parts bathed by helper: Left arm, Front perineal area, Buttocks, Left upper leg, Right lower leg, Left lower leg     Bathing assist Assist Level: Moderate Assistance - Patient 50 - 74%     Upper Body Dressing/Undressing Upper body dressing   What is the patient wearing?: Pull over shirt    Upper body assist Assist Level: Maximal Assistance - Patient 25 - 49%    Lower Body Dressing/Undressing Lower body dressing      What is the patient wearing?: Pants, Incontinence brief     Lower body assist Assist for lower body dressing: Total Assistance - Patient < 25%     Toileting Toileting    Toileting assist Assist for toileting: Total Assistance - Patient < 25%     Transfers Chair/bed transfer  Transfers assist     Chair/bed transfer assist level: Maximal Assistance - Patient 25 - 49% (squat pivot)     Locomotion Ambulation   Ambulation assist   Ambulation activity did not occur: Safety/medical concerns  Assist level: 2 helpers Assistive device: Other (comment) (L hallway rail) Max distance: 74ft   Walk 10 feet activity   Assist  Walk 10 feet activity did not occur: Safety/medical concerns  Walk 50 feet activity   Assist Walk 50 feet with 2 turns activity did not occur: Safety/medical concerns         Walk 150 feet activity   Assist Walk 150 feet activity did not occur: Safety/medical concerns         Walk 10 feet on uneven surface  activity   Assist Walk 10 feet on uneven surfaces activity did not occur: Safety/medical concerns         Wheelchair     Assist Is the patient using a wheelchair?: Yes Type of Wheelchair: Manual    Wheelchair assist level: Dependent - Patient 0% Max wheelchair distance: 150    Wheelchair 50 feet with 2 turns activity    Assist        Assist Level: Dependent - Patient 0%   Wheelchair 150 feet activity      Assist      Assist Level: Dependent - Patient 0%   Blood pressure (!) 149/76, pulse 99, temperature 98.3 F (36.8 C), temperature source Oral, resp. rate 20, height 5\' 7"  (1.702 m), weight 71.6 kg, SpO2 92 %.   Medical Problem List and Plan: 1. 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  Antithrombotics: -DVT/anticoagulation:  Pharmaceutical: d/c anticoaguluation hx ICH, DVT resolved              5/27- R brachial DVT- resolved on vascular imaging 01/11/22 3. Pain Management: Tylenol as needed 4. Mood: LCSW to evaluate and provide emotional support             -antipsychotic agents: n/a 5. Neuropsych: This patient is capable of making decisions on his own behalf. 6. Skin/Wound Care: Routine skin care checks 7. Fluids/Electrolytes/Nutrition: Routine Is and Os and follow-up chemistries             --dysphagia 1; honey thick- Has Cortrak in place and receiving TF's.  6/9 cont TF while pt at rehab 8: T2DM: Hgb A1c- metformin on hold due to AKI/variable intake (puree diet). --Monitor BS ac/hs and use SSI for elevated BS. --Change TF to 6 pm-6 am CBG (last 3)  Recent Labs    01/14/22 1642 01/14/22 2027 01/15/22 0608  GLUCAP 190* 176* 197*   Controlled 6/9 9: Hypertension: Off Lisinopril due to AKI/hyperkalemia  --continue amlodipine, atenolol, hydralazine Vitals:   01/14/22 2100 01/15/22 0526  BP:  (!) 149/76  Pulse:  99  Resp:  20  Temp:  98.3 F (36.8 C)  SpO2: 92% 92%   6/7 on cardura 1mg  qhs, amlodipine 5mg  qd, Hydralazine 100mg  TID , will increase amlodipine to 10mg   and decrease hydralazine to 50 mg - cont on current meds 10:  COPD: Continue  Anoro Ellipta daily.  Flutter valve qid. Smoked 2ppd - CXR OK  --now off prednisone, no wheezing . --Duoneb prn   11: Alcohol abuse: cessation counseling. Off phenobarb. Check Thiamine level.  --continue B12, thiamine and folic acid 12: Tobacco abuse: cessation counseling --continue  nicotine patch 13: Hyperlipidemia: LDL- 192 and Trig- 499-->continue Crestor 40 mg 14: AKI may bhave CKD : Baseline SCr-?~1.36 --Encourage --drinking honey liquids but not touching puree's.              --continue water flushes qid via cortak -Creat bumped up to 1.97- cont H2O flushes q 4 Na+ 134, hesitate increasing this , alb a little low but doubt significant 3rd spacing -Cr fluctuating 1.4-1.5 range     Latest Ref Rng & Units 01/12/2022    5:17 AM 01/11/2022    6:06 AM 01/08/2022    5:37 AM  BMP  Glucose 70 - 99 mg/dL 297  989  211   BUN 8 - 23 mg/dL 43  34  40   Creatinine 0.61 - 1.24 mg/dL 9.41  7.40  8.14   Sodium 135 - 145 mmol/L 129  129  133   Potassium 3.5 - 5.1 mmol/L 4.6  5.5  4.5   Chloride 98 - 111 mmol/L 98  97  101   CO2 22 - 32 mmol/L 20  23  23    Calcium 8.9 - 10.3 mg/dL 8.7  8.8  8.5     15. Vitamin C deficiency: On daily supplement 16. Vitamin D deficiency: Continue weekly supplement.   17. Spasticity- overall improving  On tizanidine 2mg  TID - may be contributing to tiredness - this was d/ced but pt remains somnolent most likely CVA related  18. RUE swelling/weeping- resolved, RIght brachial DVT seen on vascular 5/26,repeat showing resolution  19.  Abd distension - Likely TF related trying nepro , CT abd pelvis showed retroperitoneal bleed in right psoas muscle no sig drop in hgb   20.  Urinary retention requiring I/O cath, alpha blocker , may need to go home with foley 21.  Insomnia some am somnolence with mirtazepine (although has this as part of CVA sx as well) , no response to trazodone, will trial low dose temezepam LOS: 15 days A FACE TO FACE EVALUATION WAS  PERFORMED  01/15/2022, 7:10 AM

## 2022-01-15 NOTE — Plan of Care (Signed)
  Problem: Consults Goal: RH STROKE PATIENT EDUCATION Description: See Patient Education module for education specifics  Outcome: Progressing   Problem: RH BOWEL ELIMINATION Goal: RH STG MANAGE BOWEL WITH ASSISTANCE Description: STG Manage Bowel with min  Assistance. Outcome: Progressing Goal: RH STG MANAGE BOWEL W/MEDICATION W/ASSISTANCE Description: STG Manage Bowel with Medication with mod I Assistance. Outcome: Progressing   Problem: RH BLADDER ELIMINATION Goal: RH STG MANAGE BLADDER WITH ASSISTANCE Description: STG Manage Bladder With Assistance Outcome: Progressing Goal: RH STG MANAGE BLADDER WITH MEDICATION WITH ASSISTANCE Description: STG Manage Bladder With Medication With Assistance. Outcome: Progressing   Problem: RH SAFETY Goal: RH STG ADHERE TO SAFETY PRECAUTIONS W/ASSISTANCE/DEVICE Description: STG Adhere to Safety Precautions With cues Assistance/Device. Outcome: Progressing   Problem: RH PAIN MANAGEMENT Goal: RH STG PAIN MANAGED AT OR BELOW PT'S PAIN GOAL Description: Manage pain at or below level 4  Outcome: Progressing   Problem: RH KNOWLEDGE DEFICIT Goal: RH STG INCREASE KNOWLEDGE OF DIABETES Description: Patient and wife will be able to manage DM with medications and dietary modifications using handouts and educational materials independently Outcome: Progressing Goal: RH STG INCREASE KNOWLEDGE OF HYPERTENSION Description: Patient and wife will be able to manage HTN with medications and dietary modifications using handouts and educational materials independently Outcome: Progressing Goal: RH STG INCREASE KNOWLEDGE OF DYSPHAGIA/FLUID INTAKE Description: Patient and wife will be able to manage Dysphagia, medications and dietary modifications using handouts and educational materials independently Outcome: Progressing Goal: RH STG INCREASE KNOWLEGDE OF HYPERLIPIDEMIA Description: Patient and wife will be able to manage HLD with medications and dietary  modifications using handouts and educational materials independently Outcome: Progressing   Problem: RH Vision Goal: RH LTG Vision (Specify) Outcome: Progressing

## 2022-01-16 DIAGNOSIS — J690 Pneumonitis due to inhalation of food and vomit: Secondary | ICD-10-CM

## 2022-01-16 DIAGNOSIS — R1312 Dysphagia, oropharyngeal phase: Secondary | ICD-10-CM

## 2022-01-16 DIAGNOSIS — G479 Sleep disorder, unspecified: Secondary | ICD-10-CM

## 2022-01-16 LAB — GLUCOSE, CAPILLARY
Glucose-Capillary: 201 mg/dL — ABNORMAL HIGH (ref 70–99)
Glucose-Capillary: 149 mg/dL — ABNORMAL HIGH (ref 70–99)
Glucose-Capillary: 190 mg/dL — ABNORMAL HIGH (ref 70–99)
Glucose-Capillary: 146 mg/dL — ABNORMAL HIGH (ref 70–99)

## 2022-01-16 NOTE — Progress Notes (Signed)
Occupational Therapy Weekly Progress Note  Patient Details  Name: Ronnie Jones MRN: [id number] Date of Birth: [birth year]  Beginning of progress report period: January 09, 2022 End of progress report period: January 16, 2022  Today's Date: 01/16/2022 OT Individual Time: 1660-6004 OT Individual Time Calculation (min): 55 min    Patient has met 0 of 3 short term goals.  Pt has unfortunately made slow progress this week towards LTG. Family and pt are currently considering hospice post DC with palliative care team. 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 RUE and hand currently assessed at Brummstrom level II and I respectively with flexor synergy/hypertone. Anticipate 24/7 S and max physical assist required upon DC, will most likely need to complete BADL at bed level for pt/CG safety. Will downgrade goals to focus on CG goals pending family's/ pt's DC plans.   Patient continues to demonstrate the following deficits: muscle weakness, decreased cardiorespiratoy endurance, impaired timing and sequencing, abnormal tone, unbalanced muscle activation, decreased coordination, and decreased motor planning, decreased midline orientation, decreased attention to right, and decreased motor planning, decreased initiation, decreased attention, decreased awareness, decreased problem solving, decreased safety awareness, decreased memory, and delayed processing, and decreased sitting balance, decreased standing balance, decreased postural control, hemiplegia, and decreased balance strategies and therefore will continue to benefit from skilled OT intervention to enhance overall performance with BADL and Reduce care partner burden.  Patient not progressing toward long term goals.  See goal revision..  Plan of care revisions: see care plan.  OT Short Term Goals Week 1:  OT Short Term Goal 1 (Week 1): Pt will report for 2  consecutive sessions < 3/10 pain OT Short Term Goal 1 - Progress (Week 1): Met OT Short Term Goal 2 (Week 1): Pt will sit EOB at least 3 mins during ADL task to promote OOB tolerance OT Short Term Goal 2 - Progress (Week 1): Met OT Short Term Goal 3 (Week 1): Pt will complete 1 step of dressing task at bed level with no more than min verbal cues OT Short Term Goal 3 - Progress (Week 1): Met Week 2:  OT Short Term Goal 1 (Week 2): Pt will don shirt with mod A. OT Short Term Goal 1 - Progress (Week 2): Not met OT Short Term Goal 2 (Week 2): Pt will don pants with mod A. OT Short Term Goal 2 - Progress (Week 2): Not met OT Short Term Goal 3 (Week 2): Pt will complete >3 grooming tasks at sink with S. OT Short Term Goal 3 - Progress (Week 2): Not met Week 3:  OT Short Term Goal 1 (Week 3): STG = LTG 2/2 ELOS  Skilled Therapeutic Interventions/Progress Updates:    Pt received semi-reclined in bed, no c/o pain but appears fatigued, agreeable to therapy with encouragement. Session focus on self-care retraining, activity tolerance, transfer retraining in prep for improved ADL/IADL/func mobility performance + decreased caregiver burden. SatO2 at 93% on RA. Total A of 2 to don pants at bed level, pt able to bridge to assist when feet held in place. Came to sitting EOB with heavy max A of 2 to lift trunk and progress BLE off bed.   Max A of 1 and intermittent assist from two to maintain static sitting balance due to LOB on his R/posterior lean.  Guided through 10 breaths on IS.  Squat-pivot to and from [place] with heavy max A of 2 due  to R lean. Completed UB bathing and applied deodorant with total A to incorporate RUE functionally, otherwise pt able wash face, brush hair and wash under RUE to assist. Total A to swap out hospital gown.  Pt transported outside for change in scenery and to support psychoemotional health, pt appears more alert and engaged outdoors to engage in conversation about his occupational  history.  Total A of 2 to return to supine and boost up in bed.   Pt left with HOB at 30 degrees with bed alarm engaged, call bell in reach, and all immediate needs met.    Therapy Documentation Precautions:  Precautions Precautions: Fall Precaution Comments: dense R hemiparesis Restrictions Weight Bearing Restrictions: No  Pain: Pain Assessment Pain Scale: 0-10 Pain Score: 0-No pain ADL: See Care Tool for more details.  Therapy/Group: Individual Therapy  Volanda Napoleon MS, OTR/L  01/16/2022, 6:47 AM

## 2022-01-16 NOTE — Progress Notes (Signed)
Physical Therapy Weekly Progress Note  Patient Details  Name: Ronnie Jones MRN: [id number] Date of Birth: [birth year]  Beginning of progress report period: January 09, 2022 End of progress report period: January 16, 2022  Today's Date: 01/16/2022 PT Individual Time: 6644-0347 PT Individual Time Calculation (min): 40 min   Patient has met 0 of 3 short term goals.  Pt has had fcuntional decline since last week. Was able to perform sitting balance EOB with supervision assist and transfer in stedy with min assist as well as ambulate with rail in hall up to 39f with active use of RLE. Over the last week pt has declined to mod -max assist sitting balance, max assist +2 for tranfers, worsening pushers syndrome and decreased activation of the RLE.   Patient continues to demonstrate the following deficits muscle weakness, muscle joint tightness, and muscle paralysis, decreased cardiorespiratoy endurance and decreased oxygen support, impaired timing and sequencing, abnormal tone, unbalanced muscle activation, and decreased motor planning, decreased visual perceptual skills, decreased midline orientation, decreased attention to right, and ideational apraxia, decreased initiation, decreased attention, decreased awareness, decreased problem solving, decreased safety awareness, and delayed processing, and decreased sitting balance, decreased standing balance, decreased postural control, hemiplegia, and decreased balance strategies and therefore will continue to benefit from skilled PT intervention to increase functional independence with mobility.  Patient not progressing toward long term goals.  See goal revision..  Continue plan of care.  PT Short Term Goals Week 2:  PT Short Term Goal 1 (Week 2): Pt will transfer with mod assist consistently without lift PT Short Term Goal 1 - Progress (Week 2): Not met PT Short Term Goal 2 (Week 2): Pt wil will ambulate 316fwith LRAD and max assist of 1 PT Short Term Goal 2  - Progress (Week 2): Not met PT Short Term Goal 3 (Week 2): Pt will propell WC [id number] min assist PT Short Term Goal 3 - Progress (Week 2): Not met Week 3:  PT Short Term Goal 1 (Week 3): Pt will sit EOB with mod assist up to 5 minutes. PT Short Term Goal 2 (Week 3): Pt will transfer to WC [facility] mod assist on slide board PT Short Term Goal 3 (Week 3): Pt will tolerate sitting in WC >2 hours between therapies PT Short Term Goal 4 (Week 3): Family education will begin  Skilled Therapeutic Interventions/Progress Updates:   Pt received supine in bed and agreeable to PT. Supine>sit transfer with max assist from PT. Sitting balance EOB with max assist due to increasing pushers syndrom to the R and posterior. Squat pivot [facility] +2 for safety and RLE blocked. No active movement noted on this day. Family encouraged to be present for session to see change in status. Sit<>stand at rain in hall x 3 with max assist and RLE blocked. Adduction tone noted in the RLE requiring pt to block RLE in abduction and extension. Gait at rain in hall with DF wrap x 5 ft with max-total A* +2 for management of RLE and WC follow for safety. RLE buckling on every step with no quad activation on this. PT performed seated hip extension to assess glutes with no activation on this day noted. Pt returned to room and performed stedy transfer to bed with Total +2 assist due to increasing pushers syndrom. Sit>supine completed with max assist +2 and left supine in bed with call bell in reach and all needs met.   PT spoke with wife regarding decline  in functional status and concern for lack of functional return to this point. Wife receptive of information, but blames pt's breathing difficulties on recent change in status. PT encouraged family to participate in therapy again if their plan is still to take him home with the hopes of improved functional mobility.       Therapy Documentation Precautions:   Precautions Precautions: Fall Precaution Comments: dense R hemiparesis Restrictions Weight Bearing Restrictions: No    Vital Signs: Therapy Vitals Temp: 98.1 F (36.7 C) Pulse Rate: 92 Resp: 17 BP: 133/66 Patient Position (if appropriate): Lying Oxygen Therapy SpO2: 94 % O2 Device: Room Air Pain: Pain Assessment Pain Scale: 0-10 Pain Score: 0-No pain   Therapy/Group: Individual Therapy  [name] 01/16/2022, 3:28 PM

## 2022-01-16 NOTE — Progress Notes (Signed)
Speech Language Pathology Weekly Progress and Session Note  Patient Details  Name: Kasch Borquez MRN: [id number] Date of Birth: 10-10-1950  Beginning of progress report period: January 08, 2022 End of progress report period: January 16, 2022  Today's Date: 01/16/2022 SLP Individual Time: 1419-1500 SLP Individual Time Calculation (min): 41 min  Short Term Goals: Week 2: SLP Short Term Goal 1 (Week 2): Patient will consume current diet without overt s/sx of aspiration with min-to-mod A verbal cues for use of swallowing compensatory strategies SLP Short Term Goal 1 - Progress (Week 2): Met SLP Short Term Goal 2 (Week 2): Patient will be oriented x4 with min-to-mod A for use of external aids SLP Short Term Goal 2 - Progress (Week 2): Met SLP Short Term Goal 3 (Week 2): Patient will complete basic problem solving with mod A verval/visual cues SLP Short Term Goal 3 - Progress (Week 2): Not met SLP Short Term Goal 4 (Week 2): Patient will implement speech intelligbility strategies at the word level with min-tomod A verbal cues SLP Short Term Goal 4 - Progress (Week 2): Met SLP Short Term Goal 5 (Week 2): Patient will exhibit recall of recent information with mod A verbal/visual cues for use of external aids SLP Short Term Goal 5 - Progress (Week 2): Not met    New Short Term Goals: Week 3: SLP Short Term Goal 1 (Week 3): Patient will be oriented x4 with min A for use of external aids SLP Short Term Goal 2 (Week 3): Patient will exhibit recall of recent information with mod A verbal/visual cues for use of external aids SLP Short Term Goal 3 (Week 3): Patient will complete basic problem solving with mod A verbal/visual cues SLP Short Term Goal 4 (Week 3): Patient will consume current diet (Dys 3, HTL) without overt s/sx of aspiration with min A verbal cues for use of swallowing compensatory strategies  Weekly Progress Updates: Pt has made slow gains this week and has met 3 out of 5 STGs this  reporting period. Patient has been limited by fatigue and motivation/interest, changed to 15/7 due to inability to tolerate previous therapy schedule. Pt is currently completing cognitive tasks with mod-max A verbal and visual cues, again impacted by decreased participation in structured cognitive tasks. Pt generally speaks in 1-2 word utterances and speech intelligibility ~90% with strategies. Oral intake continues to be severely limited, pt requiring max A encouragement for trials. Pt currently tolerating Dys 2/HTL with no overt s/s aspiration during limited observations, Cortrak remains in place for supplemental nutrition. Wife has been present during tx sessions and educated on pt current status and recommendations, training and education is ongoing. Pt has been meeting with Palliative care team during CIR stay. Patient would benefit from continued skilled SLP intervention to maximize cognitive, speech, and swallow functioning and overall functional independence prior to discharge.     Intensity: Minumum of 1-2 x/day, 30 to 90 minutes Frequency: 3 to 5 out of 7 days Duration/Length of Stay: 6/21 Treatment/Interventions: Cognitive remediation/compensation;Environmental controls;Internal/external aids;Cueing hierarchy;Dysphagia/aspiration precaution training;Functional tasks;Patient/family education;Therapeutic Activities  Daily Session  Skilled Therapeutic Interventions: Pt seen for skilled ST with focus on cognitive and swallowing goals, pt in bed with RN present for meds via tube. With encouragement, pt participating in orientation task benefiting from mod A cues for awareness of temporal concepts. Pt able to recall 3 events of the morning with mod-max A question cues. SLP thickening soda to honey consistency for pt, tolerating with min A cues for proper  positioning and small sips. Pt with no overt s/s aspiration. Pt refused all solid intake at this time despite max encouragement. Pt left in bed with  alarm set and all needs within reach. Cont ST POC.      General    Pain Pain Assessment Pain Scale: 0-10 Pain Score: 0-No pain  Therapy/Group: Individual Therapy  Dewaine Conger 01/16/2022, 2:35 PM

## 2022-01-16 NOTE — Progress Notes (Signed)
PROGRESS NOTE   Subjective/Complaints:   Still not sleeping well.   ROS: Limited due to cognitive/behavioral   Objective:   DG Abd Portable 1V  Result Date: 01/15/2022 CLINICAL DATA:  Feeding tube placement EXAM: PORTABLE ABDOMEN - 1 VIEW COMPARISON:  Radiograph 01/08/2022 FINDINGS: Feeding tube with tip position in the pyloric region the stomach. IMPRESSION: Feeding tube with tip in the distal stomach. Electronically Signed   By: Genevive Bi M.D.   On: 01/15/2022 19:23    No results for input(s): "WBC", "HGB", "HCT", "PLT" in the last 72 hours.  No results for input(s): "NA", "K", "CL", "CO2", "GLUCOSE", "BUN", "CREATININE", "CALCIUM" in the last 72 hours.   Intake/Output Summary (Last 24 hours) at 01/16/2022 0834 Last data filed at 01/16/2022 6962 Gross per 24 hour  Intake 2685 ml  Output 1650 ml  Net 1035 ml        Physical Exam: Vital Signs Blood pressure (!) 158/80, pulse (!) 105, temperature 97.7 F (36.5 C), temperature source Oral, resp. rate 19, height 5\' 7"  (1.702 m), weight 69.7 kg, SpO2 99 %.   Constitutional: No distress . Vital signs reviewed. HEENT: NCAT, EOMI, oral membranes moist, NGT Neck: supple Cardiovascular: RRR without murmur. No JVD    Respiratory/Chest: CTA Bilaterally without wheezes or rales. Normal effort    GI/Abdomen: BS +, non-tender, non-distended Ext: no clubbing, cyanosis, or edema Psych: pleasant and cooperative  Skin: No evidence of breakdown, no evidence of rash  Neuro: Cranial nerves II through XII intact, alert and oriented x3 with ongoing dysarthria, follows simple commands..  motor strength is 5 out of 5 in left and 0 of 5 right deltoid, bicep, tricep, trace grip, hip flexor, knee extensors, ankle dorsiflexor and plantar flexor Some trace RIght hip add and shoulder add--no real changes today  Tone MAS 1-2 at Right elbow flexors  Assessment/Plan: 1. Functional  deficits which require 3+ hours per day of interdisciplinary therapy in a comprehensive inpatient rehab setting. Physiatrist is providing close team supervision and 24 hour management of active medical problems listed below. Physiatrist and rehab team continue to assess barriers to discharge/monitor patient progress toward functional and medical goals  Care Tool:  Bathing  Bathing activity did not occur: Refused Body parts bathed by patient: Face, Left upper leg, Abdomen, Chest, Right arm   Body parts bathed by helper: Left arm, Front perineal area, Buttocks, Left upper leg, Right lower leg, Left lower leg     Bathing assist Assist Level: Moderate Assistance - Patient 50 - 74%     Upper Body Dressing/Undressing Upper body dressing   What is the patient wearing?: Pull over shirt    Upper body assist Assist Level: Maximal Assistance - Patient 25 - 49%    Lower Body Dressing/Undressing Lower body dressing      What is the patient wearing?: Pants, Incontinence brief     Lower body assist Assist for lower body dressing: Total Assistance - Patient < 25%     Toileting Toileting    Toileting assist Assist for toileting: Total Assistance - Patient < 25%     Transfers Chair/bed transfer  Transfers assist     Chair/bed transfer  assist level: Maximal Assistance - Patient 25 - 49% (squat pivot)     Locomotion Ambulation   Ambulation assist   Ambulation activity did not occur: Safety/medical concerns  Assist level: 2 helpers Assistive device: Other (comment) (L hallway rail) Max distance: 1820ft   Walk 10 feet activity   Assist  Walk 10 feet activity did not occur: Safety/medical concerns        Walk 50 feet activity   Assist Walk 50 feet with 2 turns activity did not occur: Safety/medical concerns         Walk 150 feet activity   Assist Walk 150 feet activity did not occur: Safety/medical concerns         Walk 10 feet on uneven surface   activity   Assist Walk 10 feet on uneven surfaces activity did not occur: Safety/medical concerns         Wheelchair     Assist Is the patient using a wheelchair?: Yes Type of Wheelchair: Manual    Wheelchair assist level: Dependent - Patient 0% Max wheelchair distance: 150    Wheelchair 50 feet with 2 turns activity    Assist        Assist Level: Dependent - Patient 0%   Wheelchair 150 feet activity     Assist      Assist Level: Dependent - Patient 0%   Blood pressure (!) 158/80, pulse (!) 105, temperature 97.7 F (36.5 C), temperature source Oral, resp. rate 19, height 5\' 7"  (1.702 m), weight 69.7 kg, SpO2 99 %.   Medical Problem List and Plan: 1. 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 Intensity 15/7             -ELOS 6/21  , Mod A to sup A 2.  Antithrombotics: -DVT/anticoagulation:  Pharmaceutical: d/c anticoaguluation hx ICH, DVT resolved              5/27- R brachial DVT- resolved on vascular imaging 01/11/22 3. Pain Management: Tylenol as needed 4. Mood: LCSW to evaluate and provide emotional support             -antipsychotic agents: n/a 5. Neuropsych: This patient is capable of making decisions on his own behalf. 6. Skin/Wound Care: Routine skin care checks 7. Fluids/Electrolytes/Nutrition: Routine Is and Os and follow-up chemistries             --dysphagia 1; honey thick- Has Cortrak in place and receiving TF's.  6/10 tolerating, cont TF while pt at rehab 8: T2DM: Hgb A1c- metformin on hold due to AKI/variable intake (puree diet). --Monitor BS ac/hs and use SSI for elevated BS. --Change TF to 6 pm-6 am CBG (last 3)  Recent Labs    01/15/22 1650 01/15/22 2110 01/16/22 0644  GLUCAP 149* 124* 201*  6/10 Fair control, high am cbg. Consider pm insulin 9:  Hypertension: Off Lisinopril due to AKI/hyperkalemia  --continue amlodipine, atenolol, hydralazine Vitals:   01/15/22 1935 01/16/22 0452  BP: (!) 143/78 (!) 158/80  Pulse: (!) 102 (!) 105  Resp: 16 19  Temp: 98.5 F (36.9 C) 97.7 F (36.5 C)  SpO2: 94% 99%   6/10 on cardura 1mg  qhs, amlodipine 5mg  qd, Hydralazine 100mg  TID ,  amlodipine  10mg   and  hydralazine   50 mg - cont on current meds 10:  COPD: Continue Anoro Ellipta daily.  Flutter valve qid. Smoked 2ppd - CXR OK  --now off prednisone, no wheezing . --Duoneb prn   11: Alcohol abuse: cessation counseling. Off phenobarb. Check Thiamine level.  --continue B12, thiamine and folic acid 12: Tobacco abuse: cessation counseling --continue nicotine patch 13: Hyperlipidemia: LDL- 192 and Trig- 499-->continue Crestor 40 mg 14: AKI may bhave CKD : Baseline SCr-?~1.36 --Encourage --drinking honey liquids but not touching puree's.              --continue water flushes qid via cortak -Creat bumped up to 1.97- cont H2O flushes q 4 Na+ 134, hesitate increasing this , alb a little low but doubt significant 3rd spacing -Cr fluctuating 1.4-1.5 range     Latest Ref Rng & Units 01/12/2022    5:17 AM 01/11/2022    6:06 AM 01/08/2022    5:37 AM  BMP  Glucose 70 - 99 mg/dL 675  916  384   BUN 8 - 23 mg/dL 43  34  40   Creatinine 0.61 - 1.24 mg/dL 6.65  9.93  5.70   Sodium 135 - 145 mmol/L 129  129  133   Potassium 3.5 - 5.1 mmol/L 4.6  5.5  4.5   Chloride 98 - 111 mmol/L 98  97  101   CO2 22 - 32 mmol/L 20  23  23    Calcium 8.9 - 10.3 mg/dL 8.7  8.8  8.5    follow up bmet monday 15. Vitamin C deficiency: On daily supplement 16. Vitamin D deficiency: Continue weekly supplement.   17. Spasticity- overall improving  On tizanidine 2mg  TID - may be contributing to tiredness - this was d/ced but pt remains somnolent most likely CVA related  18. RUE swelling/weeping- resolved, RIght brachial DVT seen on vascular 1/77 5/26,improving  19.   Abd distension - Likely TF related trying nepro , CT abd pelvis showed retroperitoneal bleed in right psoas muscle no sig drop in hgb   20.  Urinary retention requiring I/O cath, alpha blocker , may need to go home with foley 21.  Insomnia some am somnolence with mirtazepine (although has this as part of CVA sx as well) , no response to trazodone, will trial low dose temezepam  6/10 did not take temazepam last night or previous   -check sleep chart LOS: 16 days A FACE TO FACE EVALUATION WAS PERFORMED  01/16/2022, 8:34 AM

## 2022-01-17 LAB — GLUCOSE, CAPILLARY
Glucose-Capillary: 190 mg/dL — ABNORMAL HIGH (ref 70–99)
Glucose-Capillary: 206 mg/dL — ABNORMAL HIGH (ref 70–99)
Glucose-Capillary: 161 mg/dL — ABNORMAL HIGH (ref 70–99)
Glucose-Capillary: 156 mg/dL — ABNORMAL HIGH (ref 70–99)

## 2022-01-17 MED ORDER — INSULIN GLARGINE-YFGN 100 UNIT/ML ~~LOC~~ SOLN
5.0000 [IU] | Freq: Every day | SUBCUTANEOUS | Status: DC
  Administered 2022-01-17 – 2022-01-20 (×4): 5 [IU] via SUBCUTANEOUS
  Filled 2022-01-17 (×5): qty 0.05

## 2022-01-17 NOTE — Progress Notes (Signed)
Occupational Therapy Session Note  Patient Details  Name: Ronnie Jones MRN: [id number] Date of Birth: [birth year]  Today's Date: 01/17/2022 OT Individual Time: 0810-0910 OT Individual Time Calculation (min): 60 min    Short Term Goals: Week 1:  OT Short Term Goal 1 (Week 1): Pt will report for 2 consecutive sessions < 3/10 pain OT Short Term Goal 1 - Progress (Week 1): Met OT Short Term Goal 2 (Week 1): Pt will sit EOB at least 3 mins during ADL task to promote OOB tolerance OT Short Term Goal 2 - Progress (Week 1): Met OT Short Term Goal 3 (Week 1): Pt will complete 1 step of dressing task at bed level with no more than min verbal cues OT Short Term Goal 3 - Progress (Week 1): Met Week 2:  OT Short Term Goal 1 (Week 2): Pt will don shirt with mod A. OT Short Term Goal 1 - Progress (Week 2): Not met OT Short Term Goal 2 (Week 2): Pt will don pants with mod A. OT Short Term Goal 2 - Progress (Week 2): Not met OT Short Term Goal 3 (Week 2): Pt will complete >3 grooming tasks at sink with S. OT Short Term Goal 3 - Progress (Week 2): Not met  Skilled Therapeutic Interventions/Progress Updates:   Patient in bed in supine with head elevated at the appropriate level.  Patient indicated that he had a peaceful nights sleep.   Patient seen this AM to complete BADL related task in bathing at EOB to improve functional independence. Patient 2 person lift using the stedy , but was able to assist with LUE for grabbing the bar of the stedy.  Patient instructed to place his L foot under the RLE for placement.  The pt presents with a severe RUE Lean requiring 2 person assist for postural alignment.  The pt was able to come from supine to EOB with MaxA , maintaining the position by incorporating the bed rails for additional balance for maintaining a sitting position for 10-15 minutes in duration for bathing his UB with minA and constant cues.  The pt required 2L of 02 with mask in place. The pt required MaxA  x2 for proper placement into the stedy for washing his bottom.  The pt was able to donn the UB garment with ModA and was total assist for LB dressing of brief and pants.  The pt remained at w/c LOF with his call light and alarm activated.  NT was present at the end of the session to assist with self feeding.  The pt had a report of pain for overall body pain of 6 on a 10 scale with nursing made aware. All additional needs of the pt were addressed prior to exiting the room.  Therapy Documentation Precautions:  Precautions Precautions: Fall Precaution Comments: dense R hemiparesis Restrictions Weight Bearing Restrictions: No   Therapy/Group: Individual Therapy  Yvonne Kendall 01/17/2022, 9:31 AM

## 2022-01-17 NOTE — Progress Notes (Signed)
Physical Therapy Note  Patient Details  Name: Ronnie Jones MRN: [id number] Date of Birth: 30-Jun-1951 Today's Date: 01/17/2022    Patient in bed upon PT arrival. Reports poor sleep last night and feeling very fatigued this afternoon. Patient requested to rest this afternoon due to fatigue and declined all interventions offerred for PT at this time. Patient missed 45 min of skilled PT due to fatigue, RN made aware. Will attempt to make-up missed time as able.      Malayja Freund L Geneen Dieter PT, DPT  01/17/2022, 4:02 PM

## 2022-01-17 NOTE — Progress Notes (Signed)
Speech Language Pathology Daily Session Note  Patient Details  Name: Ronnie Jones MRN: [id number] Date of Birth: [birth year]  Today's Date: 01/17/2022 SLP Individual Time: 1000-1100 SLP Individual Time Calculation (min): 60 min  Short Term Goals: Week 3: SLP Short Term Goal 1 (Week 3): Patient will be oriented x4 with min A for use of external aids SLP Short Term Goal 2 (Week 3): Patient will exhibit recall of recent information with mod A verbal/visual cues for use of external aids SLP Short Term Goal 3 (Week 3): Patient will complete basic problem solving with mod A verbal/visual cues SLP Short Term Goal 4 (Week 3): Patient will consume current diet (Dys 3, HTL) without overt s/sx of aspiration with min A verbal cues for use of swallowing compensatory strategies  Skilled Therapeutic Interventions:  Pt was seen for skilled ST targeting goals for cognition.  Upon arrival, pt was sliding out of his tilt in space wheelchair with no attempts to call for help or correct positioning.  Pt was repositioned in chair for safety and tilted back in his chair to minimize fall risk for the remainder of today's therapy session.  Given that pt has had decreased desire to eat during therapy sessions, SLP shifted focus to cognitive goals for today.  Pt required mod question cues to recall at least 2 details from his morning OT therapy session.  As session progressed, pt became increasingly disengaged and stated that he was ready to get back into bed.  Pt was transferred back to bed via Stedy with assistance from nurse tech and left in bed with bed alarm set and call bell within reach.  Wife at bedside.    Pain Pain Assessment Pain Scale: 0-10 Pain Score: 0-No pain  Therapy/Group: Individual Therapy  Thaila Bottoms, Melanee Spry 01/17/2022, 4:30 PM

## 2022-01-17 NOTE — Progress Notes (Signed)
PROGRESS NOTE   Subjective/Complaints:   Says sleep is still a problem. Sleep chart documents 11p-4a sleep. Was sleeping when I entered this morning also  ROS: Limited due to cognitive/behavioral   Objective:   DG Abd Portable 1V  Result Date: 01/15/2022 CLINICAL DATA:  Feeding tube placement EXAM: PORTABLE ABDOMEN - 1 VIEW COMPARISON:  Radiograph 01/08/2022 FINDINGS: Feeding tube with tip position in the pyloric region the stomach. IMPRESSION: Feeding tube with tip in the distal stomach. Electronically Signed   By: Suzy Bouchard M.D.   On: 01/15/2022 19:23    No results for input(s): "WBC", "HGB", "HCT", "PLT" in the last 72 hours.  No results for input(s): "NA", "K", "CL", "CO2", "GLUCOSE", "BUN", "CREATININE", "CALCIUM" in the last 72 hours.   Intake/Output Summary (Last 24 hours) at 01/17/2022 0845 Last data filed at 01/17/2022 [id number] Gross per 24 hour  Intake 3106.67 ml  Output 1501 ml  Net 1605.67 ml        Physical Exam: Vital Signs Blood pressure (!) 156/76, pulse 94, temperature 98.4 F (36.9 C), temperature source Oral, resp. rate 18, height 5\' 7"  (1.702 m), weight 69.8 kg, SpO2 94 %.   Constitutional: No distress . Vital signs reviewed. HEENT: NCAT, EOMI, oral membranes moist, NGT Neck: supple Cardiovascular: RRR without murmur. No JVD    Respiratory/Chest: CTA Bilaterally without wheezes or rales. Normal effort    GI/Abdomen: BS +, non-tender, non-distended Ext: no clubbing, cyanosis, or edema Psych: flat Skin: No evidence of breakdown, no evidence of rash  Neuro: Cranial nerves II through XII intact, alert and oriented x3 with ongoing dysarthria, follows simple commands with extra time motor strength is 5 out of 5 in left and 0 of 5 right deltoid, bicep, tricep, trace grip, hip flexor, knee extensors, ankle dorsiflexor and plantar flexor Some trace RIght hip add and shoulder add--no real changes today   Tone MAS 1-2 at Right elbow flexors--stable motor ex  Assessment/Plan: 1. Functional deficits which require 3+ hours per day of interdisciplinary therapy in a comprehensive inpatient rehab setting. Physiatrist is providing close team supervision and 24 hour management of active medical problems listed below. Physiatrist and rehab team continue to assess barriers to discharge/monitor patient progress toward functional and medical goals  Care Tool:  Bathing  Bathing activity did not occur: Refused Body parts bathed by patient: Face, Left upper leg, Abdomen, Chest, Right arm   Body parts bathed by helper: Left arm, Front perineal area, Buttocks, Left upper leg, Right lower leg, Left lower leg     Bathing assist Assist Level: Moderate Assistance - Patient 50 - 74%     Upper Body Dressing/Undressing Upper body dressing   What is the patient wearing?: Pull over shirt    Upper body assist Assist Level: Maximal Assistance - Patient 25 - 49%    Lower Body Dressing/Undressing Lower body dressing      What is the patient wearing?: Pants, Incontinence brief     Lower body assist Assist for lower body dressing: Total Assistance - Patient < 25%     Toileting Toileting    Toileting assist Assist for toileting: Total Assistance - Patient < 25%  Transfers Chair/bed transfer  Transfers assist     Chair/bed transfer assist level: Maximal Assistance - Patient 25 - 49% (squat pivot)     Locomotion Ambulation   Ambulation assist   Ambulation activity did not occur: Safety/medical concerns  Assist level: 2 helpers Assistive device: Other (comment) (L hallway rail) Max distance: 71ft   Walk 10 feet activity   Assist  Walk 10 feet activity did not occur: Safety/medical concerns        Walk 50 feet activity   Assist Walk 50 feet with 2 turns activity did not occur: Safety/medical concerns         Walk 150 feet activity   Assist Walk 150 feet activity did  not occur: Safety/medical concerns         Walk 10 feet on uneven surface  activity   Assist Walk 10 feet on uneven surfaces activity did not occur: Safety/medical concerns         Wheelchair     Assist Is the patient using a wheelchair?: Yes Type of Wheelchair: Manual    Wheelchair assist level: Dependent - Patient 0% Max wheelchair distance: 150    Wheelchair 50 feet with 2 turns activity    Assist        Assist Level: Dependent - Patient 0%   Wheelchair 150 feet activity     Assist      Assist Level: Dependent - Patient 0%   Blood pressure (!) 156/76, pulse 94, temperature 98.4 F (36.9 C), temperature source Oral, resp. rate 18, height 5\' 7"  (1.702 m), weight 69.8 kg, SpO2 94 %.   Medical Problem List and Plan: 1. 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 Intensity 15/7             -ELOS 6/21  , Mod A to sup A 2.  Antithrombotics: -DVT/anticoagulation:  Pharmaceutical: d/c anticoaguluation hx ICH, DVT resolved              5/27- R brachial DVT- resolved on vascular imaging 01/11/22 3. Pain Management: Tylenol as needed 4. Mood: LCSW to evaluate and provide emotional support             -antipsychotic agents: n/a 5. Neuropsych: This patient is capable of making decisions on his own behalf. 6. Skin/Wound Care: Routine skin care checks 7. Fluids/Electrolytes/Nutrition: Routine Is and Os and follow-up chemistries             --dysphagia 1; honey thick- Has Cortrak in place and receiving TF's.  6/11 tolerating, cont TF while pt at rehab 8: T2DM: Hgb A1c- metformin on hold due to AKI/variable intake (puree diet). --Monitor BS ac/hs and use SSI for elevated BS. --Changed TF to 6 pm-6 am CBG (last 3)  Recent Labs    01/16/22 1632 01/16/22 2133 01/17/22 0618  GLUCAP  190* 146* 190*  6/11 borderline control, high am cbg. Add low dose pm insulin while on TF 9: Hypertension: Off Lisinopril due to AKI/hyperkalemia  --continue amlodipine, atenolol, hydralazine Vitals:   01/16/22 2017 01/17/22 0509  BP: (!) 152/82 (!) 156/76  Pulse: (!) 103 94  Resp: 20 18  Temp: 98.1 F (36.7 C) 98.4 F (36.9 C)  SpO2: 94% 94%   6/11 on cardura 1mg   qhs, amlodipine 5mg  qd, Hydralazine 100mg  TID ,  amlodipine  10mg   and   hydralazine   50 mg -  -fair control on current meds 10:  COPD: Continue Anoro Ellipta daily.  Flutter valve qid. Smoked 2ppd - CXR OK  --now off prednisone, no wheezing . --Duoneb prn   11: Alcohol abuse: cessation counseling. Off phenobarb. Check Thiamine level.  --continue B12, thiamine and folic acid 12: Tobacco abuse: cessation counseling --continue nicotine patch 13: Hyperlipidemia: LDL- 192 and Trig- 499-->continue Crestor 40 mg 14: AKI may bhave CKD : Baseline SCr-?~1.36 --Encourage --drinking honey liquids but not touching puree's.              --continue water flushes qid via cortak -Creat bumped up to 1.97- cont H2O flushes 227ml q 4 Na+ 134, hesitate increasing this , alb a little low but doubt significant 3rd spacing -Cr fluctuating 1.4-1.5 range     Latest Ref Rng & Units 01/12/2022    5:17 AM 01/11/2022    6:06 AM 01/08/2022    5:37 AM  BMP  Glucose 70 - 99 mg/dL 147  172  259   BUN 8 - 23 mg/dL 43  34  40   Creatinine 0.61 - 1.24 mg/dL 1.47  1.40  1.55   Sodium 135 - 145 mmol/L 129  129  133   Potassium 3.5 - 5.1 mmol/L 4.6  5.5  4.5   Chloride 98 - 111 mmol/L 98  97  101   CO2 22 - 32 mmol/L 20  23  23    Calcium 8.9 - 10.3 mg/dL 8.7  8.8  8.5    6/11 follow up bmet monday 15. Vitamin C deficiency: On daily supplement 16. Vitamin D deficiency: Continue weekly supplement.   17. Spasticity- overall improving  On tizanidine 2mg  TID - may be contributing to tiredness - this was d/ced but pt remains somnolent most likely CVA  related  18. RUE swelling/weeping- resolved, RIght brachial DVT seen on vascular US 5/26,improving  19.  Abd distension - Likely TF related trying nepro , CT abd pelvis showed retroperitoneal bleed in right psoas muscle no sig drop in hgb   20.  Urinary retention requiring I/O cath, alpha blocker , may need to go home with foley 21.  Insomnia some am somnolence with mirtazepine (although has this as part of CVA sx as well) , no response to trazodone, will trial low dose temezepam  6/11 did not take temazepam last night or previous   -slept 5hr+ last night LOS: 17 days A FACE TO FACE EVALUATION WAS PERFORMED  Meredith Staggers 01/17/2022, 8:45 AM

## 2022-01-17 NOTE — Plan of Care (Signed)
  Problem: RH Balance Goal: LTG Patient will maintain dynamic sitting balance (PT) Description: LTG:  Patient will maintain dynamic sitting balance with assistance during mobility activities (PT) Flowsheets (Taken 01/17/2022 0710) LTG: Pt will maintain dynamic sitting balance during mobility activities with:: Minimal Assistance - Patient > 75%   Problem: RH Bed Mobility Goal: LTG Patient will perform bed mobility with assist (PT) Description: LTG: Patient will perform bed mobility with assistance, with/without cues (PT). Flowsheets (Taken 01/17/2022 0710) LTG: Pt will perform bed mobility with assistance level of: Moderate Assistance - Patient 50 - 74% Note: Downgraded due to regression in functional status    Problem: RH Bed to Chair Transfers Goal: LTG Patient will perform bed/chair transfers w/assist (PT) Description: LTG: Patient will perform bed to chair transfers with assistance (PT). Flowsheets (Taken 01/17/2022 0710) LTG: Pt will perform Bed to Chair Transfers with assistance level: Moderate Assistance - Patient 50 - 74% Note: Downgraded due to regression in functional status    Problem: RH Ambulation Goal: LTG Patient will ambulate in controlled environment (PT) Description: LTG: Patient will ambulate in a controlled environment, # of feet with assistance (PT). Flowsheets Taken 01/17/2022 0710 LTG: Pt will ambulate in controlled environ  assist needed:: Maximal Assistance - Patient 25 - 49% Taken 01/01/2022 2313 LTG: Ambulation distance in controlled environment: 22ft with LRAD Note: Downgraded due to regression in functional status    Problem: RH Wheelchair Mobility Goal: LTG Patient will propel w/c in controlled environment (PT) Description: LTG: Patient will propel wheelchair in controlled environment, # of feet with assist (PT) Flowsheets (Taken 01/17/2022 0710) LTG: Pt will propel w/c in controlled environ  assist needed:: Minimal Assistance - Patient > 75% LTG: Propel w/c  distance in controlled environment: 61ft with LRAD Note: Downgraded due to regression in functional status  Goal: LTG Patient will propel w/c in home environment (PT) Description: LTG: Patient will propel wheelchair in home environment, # of feet with assistance (PT). Flowsheets (Taken 01/17/2022 0710) LTG: Pt will propel w/c in home environ  assist needed:: Minimal Assistance - Patient > 75% Distance: wheelchair distance in controlled environment: 25 Note: Downgraded due to regression in functional status

## 2022-01-18 ENCOUNTER — Encounter (HOSPITAL_COMMUNITY): Payer: Self-pay | Admitting: Physical Medicine & Rehabilitation

## 2022-01-18 ENCOUNTER — Other Ambulatory Visit: Payer: Self-pay

## 2022-01-18 LAB — GLUCOSE, CAPILLARY
Glucose-Capillary: 183 mg/dL — ABNORMAL HIGH (ref 70–99)
Glucose-Capillary: 168 mg/dL — ABNORMAL HIGH (ref 70–99)
Glucose-Capillary: 202 mg/dL — ABNORMAL HIGH (ref 70–99)
Glucose-Capillary: 159 mg/dL — ABNORMAL HIGH (ref 70–99)

## 2022-01-18 LAB — BASIC METABOLIC PANEL
Anion gap: 12 (ref 5–15)
BUN: 62 mg/dL — ABNORMAL HIGH (ref 8–23)
CO2: 24 mmol/L (ref 22–32)
Calcium: 9.4 mg/dL (ref 8.9–10.3)
Chloride: 101 mmol/L (ref 98–111)
Creatinine, Ser: 1.36 mg/dL — ABNORMAL HIGH (ref 0.61–1.24)
GFR, Estimated: 57 mL/min — ABNORMAL LOW (ref >60)
Glucose, Bld: 199 mg/dL — ABNORMAL HIGH (ref 70–99)
Potassium: 4 mmol/L (ref 3.5–5.1)
Sodium: 137 mmol/L (ref 135–145)

## 2022-01-18 MED ORDER — IPRATROPIUM-ALBUTEROL 0.5-2.5 (3) MG/3ML IN SOLN
3.0000 mL | Freq: Four times a day (QID) | RESPIRATORY_TRACT | Status: DC
Start: 2022-01-18 — End: 2022-01-20
  Administered 2022-01-19 (×3): 3 mL via RESPIRATORY_TRACT
  Filled 2022-01-18 (×6): qty 3

## 2022-01-18 MED ORDER — FREE WATER
200.0000 mL | Status: DC
Start: 2022-01-18 — End: 2022-01-19
  Administered 2022-01-18 – 2022-01-19 (×8): 200 mL

## 2022-01-18 NOTE — Progress Notes (Signed)
Nutrition Follow-up  DOCUMENTATION CODES:   Not applicable  INTERVENTION:  Recommend placement of access for long-term nutrition  Continue TF regimen via Cortrak:  Nepro @ 50 mL/hr (1050 mL/day) 45 mL ProSource TF - TID   Provides 2010 kcal, 118 gm protein, and 763 mL free water.   NUTRITION DIAGNOSIS:   Inadequate oral intake related to poor appetite, chronic illness, acute illness as evidenced by meal completion < 25%, per patient/family report.  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 PMH includes COPD, HTN, EtOH abuse (1 pint of tequila daily), CKD, DM, vitamin deficiencies including Vit D and Vit C.  Pt sitting up in bed during visit requesting breathing treatment. Reached out to RN with pt's concerns. Spoke with his wife in passing in the hallway. She states that he has been eating poorly and has had conversations with PMT regarding these concerns. Per PMT noted on 06/09-pt had previous improvement in PO intake with mirtazapine. Noted possible plans for home hospice after rehab.   Meal completions: 06/09: 0% x breakfast and lunch 06/10: 70%- lunch, 0%-dinner 06/11: 0%-breakfast, 0%-dinner  Pt's wt noted to continue trending down throughout admission, however appears stable within the past 2 days between 69.7-69.9 kg.   Medications: Vitamin C 551m BID, peridex, folvite, hydrocortisone suppository, SSI 0-5 units QHS, 0-9 units TID, semglee 5 units daily, melatonin, remeron, protonix, miralax, senna, thiamine, Vitamin D 50000 every 7d  Labs: BUN 62, Cr 1.36, GFR 57, CBG's 146-206 x24 hours  Diet Order:   Diet  Order             DIET DYS 2 Room service appropriate? Yes; Fluid consistency: Honey Thick  Diet effective now                   EDUCATION NEEDS:   Not appropriate for education at this time  Skin:  Skin Assessment: Reviewed RN Assessment Skin Integrity Issues:: Other (Comment) Other: skin tear: vertebral column  Last BM:  6/10 (type 7)  Height:   Ht Readings from Last 1 Encounters:  12/31/21 _0  (1.702 m)    Weight:   Wt Readings from Last 1 Encounters:  01/18/22 69.9 kg   BMI:  Body mass index is 24.14 kg/m.  Estimated Nutritional Needs:   Kcal:  1900-2100 kcals  Protein:  100-110 g  Fluid:  >/1.9 L  AClayborne Dana RDN, LDN Clinical Nutrition

## 2022-01-18 NOTE — Progress Notes (Signed)
Palliative Medicine Inpatient Follow Up Note   HPI:  [name] is a 71 year old gentleman with a past medical history of ETOH use (at least 1 pint per day), COPD, HTN, tobacco use (2-3 packs per day), and vitamin D deficiency. 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 Has been in CIR for 12 days to rehabilitate. Palliative care has been asks to get involved to further aid in goals of care conversations. Patient is not thriving in rehabilitation and we have been asked to discuss PEG tube placement.   Today's Discussion 01/18/2022  *Please note that this is a verbal dictation therefore any spelling or grammatical errors are due to the "[place] [employer]" system interpretation.  Chart reviewed inclusive of vital signs, progress notes, laboratory results, and diagnostic images.   I met with Ronnie Jones this morning. He was lying in bed and share he feels terribly. I asked him why though he was unable to assert a description.   Patients RN shares that Ronnie Jones is not desiring any oral nutrition. PT states they observe that he is regressing.  __________________________________ Addendum  I called patients spouse, Ronnie Jones, I shared that I was concerned as Ronnie Jones has not been making great strides and if anything per communication with nursing, PT, and nurse aid he is declining. I share with patricia that we really need to consider hospice at this point. She expresses understanding though she would like their son to be part of this conversation. He is unable to come to the hospital until 5PM.  __________________________________ Addendum 2  I met with patients wife, son and daughter in law this evening. Discussed patients poor clinical situation post stroke and how he is making little to no improvements. I shared that he appears to have lost motivation to pursue continued aggressive therapy. He continually asked when he can go home. Reviewed that prior to admission it seems  his health was fairly poor as well. Per his spouse he could not walk up stairs without significant DOE. She states that he has moved for slowly and struggled with ambulation for quite sometime now. We reviewed often it is someone with more compromise that acute events such as his stroke affect the most significantly.   Reviewed that in all honesty we can continue with aggressive care which patient has already shown [place] that he does not wish for or we can change our focus to comfort and get him home with hospice care. I described hospice as a service for patients who have a life expectancy of 6 months or less. The goal of hospice is the preservation of dignity and quality at the end phases of life. Under hospice care, the focus changes from curative to symptom relief.   Discussed the differences between home hospice and inpatient hospice.   Family have shared that they would like to discuss these concepts tonight over dinner.   Questions and concerns addressed   Palliative Support Provided  Objective Assessment: Vital Signs Vitals:   01/18/22 0403 01/18/22 0853  BP: 137/69   Pulse: 91   Resp: 18   Temp: 99.1 F (37.3 C)   SpO2: 92% 94%    Intake/Output Summary (Last 24 hours) at 01/18/2022 1147 Last data filed at 01/18/2022 0555 Gross per 24 hour  Intake 1245 ml  Output 1550 ml  Net -305 ml    Last Weight  Most recent update: 01/18/2022  4:58 AM    Weight  69.9 kg (154 lb 1.6  oz)            Gen: Older Caucasian male in no acute distress HEENT: moist mucous membranes CV: Irregular rate and rhythm PULM: On room air in no acute distress ABD: soft/nontender EXT: No edema Neuro: Alert and oriented x3 - slow in response  SUMMARY OF RECOMMENDATIONS   DNAR/DNI  Patient does not want a gastrostomy tube  Discussion held this evening on hospice care as patient has made it clear he does not desire aggressive therapy any longer. Family plan to talk this evening plan for follow up  tomorrow at 11AM  Ongoing palliative care support - I will not be present tomorrow though I have requested that my colleague Hildred Alamin Romines follow up  Billing based on MDM: High  Total Time: 65 minutes ______________________________________________________________________________________ [facility] Team Team Cell Phone: [phone] Please utilize secure chat with additional questions, if there is no response within 30 minutes please call the above phone number  Palliative Medicine Team providers are available by phone from 7am to 7pm daily and can be reached through the team cell phone.  Should this patient require assistance outside of these hours, please call the patient's attending physician.

## 2022-01-18 NOTE — Discharge Instructions (Addendum)
Inpatient Rehab Discharge Instructions  Ronnie Jones Discharge date and time: 01/21/22   Activities/Precautions/ Functional Status: Activity: no lifting, driving, or strenuous exercise cleared by MD Diet: Chopped foods. Honey thick liquids Wound Care: as directed   Functional status:  ___ No restrictions     ___ Walk up steps independently _X__ 24/7 supervision/assistance   ___ Walk up steps with assistance ___ Intermittent supervision/assistance  ___ Bathe/dress independently ___ Walk with walker     __X_ Bathe/dress with assistance ___ Walk Independently    ___ Shower independently ___ Walk with assistance    ___ Shower with assistance _X__ No alcohol     ___ Return to work/school ________  Special Instructions:  STROKE/TIA DISCHARGE INSTRUCTIONS SMOKING Cigarette smoking nearly doubles your risk of having a stroke & is the single most alterable risk factor  If you smoke or have smoked in the last 12 months, you are advised to quit smoking for your health. Most of the excess cardiovascular risk related to smoking disappears within a year of stopping. Ask you doctor about anti-smoking medications [place] Quit Line: 1-800-QUIT NOW Free Smoking Cessation Classes [phone]  CHOLESTEROL Know your levels; limit fat & cholesterol in your diet  Lipid Panel     Component Value Date/Time   CHOL 321 (H) 12/21/2021 1737   TRIG 213 (H) 12/25/2021 0555   HDL 61 12/21/2021 1737   CHOLHDL 5.3 12/21/2021 1737   VLDL 68 (H) 12/21/2021 1737   LDLCALC 192 (H) 12/21/2021 1737     Many patients benefit from treatment even if their cholesterol is at goal. Goal: Total Cholesterol (CHOL) less than 160 Goal:  Triglycerides (TRIG) less than 150 Goal:  HDL greater than 40 Goal:  LDL (LDLCALC) less than 100   BLOOD PRESSURE American Stroke Association blood pressure target is less that 120/80 mm/Hg  Your discharge blood pressure is:  BP: 137/69 Monitor your blood pressure Limit your salt and  alcohol intake Many individuals will require more than one medication for high blood pressure  DIABETES (A1c is a blood sugar average for last 3 months) Goal HGBA1c is under 7% (HBGA1c is blood sugar average for last 3 months)  Diabetes:     Lab Results  Component Value Date   HGBA1C 6.1 (H) 12/21/2021    Your HGBA1c can be lowered with medications, healthy diet, and exercise. Check your blood sugar as directed by your physician Call your physician if you experience unexplained or low blood sugars.  PHYSICAL ACTIVITY/REHABILITATION Goal is 30 minutes at least 4 days per week  Activity: No driving, Therapies: see above Return to work: N/A Activity decreases your risk of heart attack and stroke and makes your heart stronger.  It helps control your weight and blood pressure; helps you relax and can improve your mood. Participate in a regular exercise program. Talk with your doctor about the best form of exercise for you (dancing, walking, swimming, cycling).  DIET/WEIGHT Goal is to maintain a healthy weight  Your discharge diet is:  Diet Order             DIET DYS 2 Room service appropriate? Yes; Fluid consistency: Honey Thick  Diet effective now                  honey liquids Your height is:  Height: 5\' 7"  (170.2 cm) Your current weight is: Weight: 69.9 kg Your Body Mass Index (BMI) is:  BMI (Calculated): 24.13 Following the type of diet specifically designed for you  will help prevent another stroke. You are at goal weight    Your goal Body Mass Index (BMI) is 19-24. Healthy food habits can help reduce 3 risk factors for stroke:  High cholesterol, hypertension, and excess weight.  RESOURCES Stroke/Support Group:  Call [phone]   STROKE EDUCATION PROVIDED/REVIEWED AND GIVEN TO PATIENT Stroke warning signs and symptoms How to activate emergency medical system (call 911). Medications prescribed at discharge. Need for follow-up after discharge. Personal risk factors for  stroke. Pneumonia vaccine given:  Flu vaccine given:  My questions have been answered, the writing is legible, and I understand these instructions.  I will adhere to these goals & educational materials that have been provided to me after my discharge from the hospital.      My questions have been answered and I understand these instructions. I will adhere to these goals and the provided educational materials after my discharge from the hospital.  Patient/Caregiver Signature _______________________________ Date __________  Clinician Signature _______________________________________ Date __________  Please bring this form and your medication list with you to all your follow-up doctor's appointments.

## 2022-01-18 NOTE — Progress Notes (Signed)
Patient removed breathing treatment mid way through and stated, "he doesn't want anymore".

## 2022-01-18 NOTE — Progress Notes (Signed)
Physical Therapy Session Note  Patient Details  Name: Ronnie Jones MRN: [id number] Date of Birth: 12-Oct-1950  Today's Date: 01/18/2022 PT Individual Time: 0930-1005 PT Individual Time Calculation (min): 35 min  Missed time 40 min. Fatigue, unable to participate. Short Term Goals: Week 2:  PT Short Term Goal 1 (Week 2): Pt will transfer with mod assist consistently without lift PT Short Term Goal 1 - Progress (Week 2): Not met PT Short Term Goal 2 (Week 2): Pt wil will ambulate 63f with LRAD and max assist of 1 PT Short Term Goal 2 - Progress (Week 2): Not met PT Short Term Goal 3 (Week 2): Pt will propell WC 1034fwith min assist PT Short Term Goal 3 - Progress (Week 2): Not met  Skilled Therapeutic Interventions/Progress Updates: Pt presents semi-reclined at 35 degrees 2/2 feeding tube.  Pt states "feels horrible" but does agree to participate w/ therapy as able.  Pt rolled side to side w/ max A to change soiled brief and perform pericare Total A.  Pt performed transfer R sidelying to sit at EOB w/ max A and then initially required mod A to maintain until scoot to EOB w/ max A and then min to occasional CGA and verbal cues to maintain midline sitting.  Pt tolerated facilitation at R hand and elbow for WB through UE.  Pt O2 sats remained >93% throughout session although noted SOB. Pt sat EOB x 6' then requested to lie down in bed.  Nursing in for bladder scan during rest break and then palliative.  Pt unable to participate further w/ therapy at this time.  Will return as able.  Bed alarm on, HOB at 35 degrees and all needs in reach.     Therapy Documentation Precautions:  Precautions Precautions: Fall Precaution Comments: dense R hemiparesis Restrictions Weight Bearing Restrictions: No General: PT Amount of Missed Time (min): 40 Minutes PT Missed Treatment Reason: Patient fatigue;Patient unwilling to participate Vital Signs:   Pain: Pain Assessment Pain Scale: 0-10 Pain Score:  0-No pain Mobility:       Therapy/Group: Individual Therapy  JeLadoris Gene/07/2022, 10:23 AM

## 2022-01-18 NOTE — Progress Notes (Signed)
PROGRESS NOTE   Subjective/Complaints:   Sleep chart ~7hr sleep , back on Mirtazepine , may d/c temazepam as pt not taking  BM yesterday , pt does not recall , still requiring I/O caths   ROS: Limited due to cognitive/behavioral   Objective:   No results found.  No results for input(s): "WBC", "HGB", "HCT", "PLT" in the last 72 hours.  Recent Labs    01/18/22 0505  NA 137  K 4.0  CL 101  CO2 24  GLUCOSE 199*  BUN 62*  CREATININE 1.36*  CALCIUM 9.4     Intake/Output Summary (Last 24 hours) at 01/18/2022 0747 Last data filed at 01/18/2022 0555 Gross per 24 hour  Intake 1245 ml  Output 1550 ml  Net -305 ml         Physical Exam: Vital Signs Blood pressure 137/69, pulse 91, temperature 99.1 F (37.3 C), temperature source Oral, resp. rate 18, height 5\' 7"  (1.702 m), weight 69.9 kg, SpO2 92 %.   General: No acute distress Mood and affect are appropriate Heart: Regular rate and rhythm no rubs murmurs or extra sounds Lungs: Clear to auscultation, breathing unlabored, no rales or wheezes Abdomen: Positive bowel sounds, soft nontender to palpation, nondistended Extremities: No clubbing, cyanosis, or edema Skin: No evidence of breakdown, no evidence of rash  Neuro: Cranial nerves II through XII intact, alert and oriented x3 with ongoing dysarthria, follows simple commands with extra time motor strength is 5 out of 5 in left and 0 of 5 right deltoid, bicep, tricep, trace grip, hip flexor, knee extensors, ankle dorsiflexor and plantar flexor Some trace RIght hip add and shoulder add--no real changes today  Tone MAS 1-2 at Right elbow flexors--stable motor ex  Assessment/Plan: 1. Functional deficits which require 3+ hours per day of interdisciplinary therapy in a comprehensive inpatient rehab setting. Physiatrist is providing close team supervision and 24 hour management of active medical problems listed  below. Physiatrist and rehab team continue to assess barriers to discharge/monitor patient progress toward functional and medical goals  Care Tool:  Bathing  Bathing activity did not occur: Refused Body parts bathed by patient: Face, Left upper leg, Abdomen, Chest, Right arm   Body parts bathed by helper: Left arm, Front perineal area, Buttocks, Left upper leg, Right lower leg, Left lower leg     Bathing assist Assist Level: Moderate Assistance - Patient 50 - 74%     Upper Body Dressing/Undressing Upper body dressing   What is the patient wearing?: Pull over shirt    Upper body assist Assist Level: Maximal Assistance - Patient 25 - 49%    Lower Body Dressing/Undressing Lower body dressing      What is the patient wearing?: Pants, Incontinence brief     Lower body assist Assist for lower body dressing: Total Assistance - Patient < 25%     Toileting Toileting    Toileting assist Assist for toileting: Total Assistance - Patient < 25%     Transfers Chair/bed transfer  Transfers assist     Chair/bed transfer assist level: Maximal Assistance - Patient 25 - 49% (squat pivot)     Locomotion Ambulation   Ambulation assist  Ambulation activity did not occur: Safety/medical concerns  Assist level: 2 helpers Assistive device: Other (comment) (L hallway rail) Max distance: 74ft   Walk 10 feet activity   Assist  Walk 10 feet activity did not occur: Safety/medical concerns        Walk 50 feet activity   Assist Walk 50 feet with 2 turns activity did not occur: Safety/medical concerns         Walk 150 feet activity   Assist Walk 150 feet activity did not occur: Safety/medical concerns         Walk 10 feet on uneven surface  activity   Assist Walk 10 feet on uneven surfaces activity did not occur: Safety/medical concerns         Wheelchair     Assist Is the patient using a wheelchair?: Yes Type of Wheelchair: Manual    Wheelchair  assist level: Dependent - Patient 0% Max wheelchair distance: 150    Wheelchair 50 feet with 2 turns activity    Assist        Assist Level: Dependent - Patient 0%   Wheelchair 150 feet activity     Assist      Assist Level: Dependent - Patient 0%   Blood pressure 137/69, pulse 91, temperature 99.1 F (37.3 C), temperature source Oral, resp. rate 18, height 5\' 7"  (1.702 m), weight 69.9 kg, SpO2 92 %.   Medical Problem List and Plan: 1. 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 Intensity 15/7             -ELOS 6/21  , Mod A to sup A 2.  Antithrombotics: -DVT/anticoagulation:  Pharmaceutical: d/c anticoaguluation hx ICH, DVT resolved              5/27- R brachial DVT- resolved on vascular imaging 01/11/22 3. Pain Management: Tylenol as needed 4. Mood: LCSW to evaluate and provide emotional support             -antipsychotic agents: n/a 5. Neuropsych: This patient is capable of making decisions on his own behalf. 6. Skin/Wound Care: Routine skin care checks 7. Fluids/Electrolytes/Nutrition: Routine Is and Os and follow-up chemistries             --dysphagia 1; honey thick- Has Cortrak in place and receiving TF's.  6/11 tolerating, cont TF while pt at rehab 8: T2DM: Hgb A1c- metformin on hold due to AKI/variable intake (puree diet). --Monitor BS ac/hs and use SSI for elevated BS. --Changed TF to 6 pm-6 am CBG (last 3)  Recent Labs    01/17/22 1658 01/17/22 2049 01/18/22 0537  GLUCAP 161* 156* 183*   CBGs inb range 6/12 9: Hypertension: Off Lisinopril due to AKI/hyperkalemia  --continue amlodipine, atenolol, hydralazine Vitals:   01/17/22 1945 01/18/22 0403  BP:  137/69  Pulse:  91  Resp:  18  Temp:  99.1 F (37.3 C)  SpO2: 96% 92%   6/11 on cardura 1mg  qhs, amlodipine  10mg   and    hydralazine   50 mg TID-  -fair control on current meds 10:  COPD: Continue Anoro Ellipta daily.  Flutter valve qid. Smoked 2ppd - CXR OK  --now off prednisone, no wheezing . --Duoneb prn   11: Alcohol abuse: cessation  counseling. Off phenobarb. Check Thiamine level.  --continue B12, thiamine and folic acid 12: Tobacco abuse: cessation counseling --continue nicotine patch 13: Hyperlipidemia: LDL- 192 and Trig- 499-->continue Crestor 40 mg 14: AKI may bhave CKD : Baseline SCr-?~1.36 --Encourage --drinking honey liquids but not touching puree's.              --continue water flushes qid via cortak -Creat bumped up to 1.97- cont H2O flushes q 4 Na+ 134, hesitate increasing this , alb a little low but doubt significant 3rd spacing -Cr fluctuating 1.4-1.5 range     Latest Ref Rng & Units 01/18/2022    5:05 AM 01/12/2022    5:17 AM 01/11/2022    6:06 AM  BMP  Glucose 70 - 99 mg/dL 485  462  703   BUN 8 - 23 mg/dL 62  43  34   Creatinine 0.61 - 1.24 mg/dL 5.00  9.38  1.82   Sodium 135 - 145 mmol/L 137  129  129   Potassium 3.5 - 5.1 mmol/L 4.0  4.6  5.5   Chloride 98 - 111 mmol/L 101  98  97   CO2 22 - 32 mmol/L 24  20  23    Calcium 8.9 - 10.3 mg/dL 9.4  8.7  8.8    BMET with elevated BUN increase free H2O 15. Vitamin C deficiency: On daily supplement 16. Vitamin D deficiency: Continue weekly supplement.   17. Spasticity- overall improving  On tizanidine 2mg  TID - may be contributing to tiredness - this was d/ced but pt remains somnolent most likely CVA related  18. RUE swelling/weeping- resolved, RIght brachial DVT seen on vascular 5/26,improving  19.  Abd distension - Likely TF related trying nepro , CT abd pelvis showed retroperitoneal bleed in right psoas muscle no sig drop in hgb   20.  Urinary retention requiring I/O cath, alpha blocker , may need to go home with foley, given potential for Hospice care in home will need to determine wishes on this  prior to D/C 21.   Insomnia some am somnolence with mirtazepine (although has this as part of CVA sx as well) , no response to trazodone, will trial low dose temezepam  6/11 did not take temazepam last night or previous   -slept 5hr+ last night LOS: 18 days A FACE TO FACE EVALUATION WAS PERFORMED  US 01/18/2022, 7:47 AM

## 2022-01-18 NOTE — Plan of Care (Signed)
  Problem: Consults Goal: RH STROKE PATIENT EDUCATION Description: See Patient Education module for education specifics  Outcome: Progressing   Problem: RH BOWEL ELIMINATION Goal: RH STG MANAGE BOWEL WITH ASSISTANCE Description: STG Manage Bowel with min  Assistance. Outcome: Progressing Goal: RH STG MANAGE BOWEL W/MEDICATION W/ASSISTANCE Description: STG Manage Bowel with Medication with mod I Assistance. Outcome: Progressing   Problem: RH BLADDER ELIMINATION Goal: RH STG MANAGE BLADDER WITH ASSISTANCE Description: STG Manage Bladder With Assistance Outcome: Progressing Goal: RH STG MANAGE BLADDER WITH MEDICATION WITH ASSISTANCE Description: STG Manage Bladder With Medication With Assistance. Outcome: Progressing   Problem: RH SAFETY Goal: RH STG ADHERE TO SAFETY PRECAUTIONS W/ASSISTANCE/DEVICE Description: STG Adhere to Safety Precautions With cues Assistance/Device. Outcome: Progressing   Problem: RH PAIN MANAGEMENT Goal: RH STG PAIN MANAGED AT OR BELOW PT'S PAIN GOAL Description: Manage pain at or below level 4  Outcome: Progressing   Problem: RH KNOWLEDGE DEFICIT Goal: RH STG INCREASE KNOWLEDGE OF DIABETES Description: Patient and wife will be able to manage DM with medications and dietary modifications using handouts and educational materials independently Outcome: Progressing Goal: RH STG INCREASE KNOWLEDGE OF HYPERTENSION Description: Patient and wife will be able to manage HTN with medications and dietary modifications using handouts and educational materials independently Outcome: Progressing Goal: RH STG INCREASE KNOWLEDGE OF DYSPHAGIA/FLUID INTAKE Description: Patient and wife will be able to manage Dysphagia, medications and dietary modifications using handouts and educational materials independently Outcome: Progressing Goal: RH STG INCREASE KNOWLEGDE OF HYPERLIPIDEMIA Description: Patient and wife will be able to manage HLD with medications and dietary  modifications using handouts and educational materials independently Outcome: Progressing   Problem: RH Vision Goal: RH LTG Vision (Specify) Outcome: Progressing

## 2022-01-18 NOTE — Progress Notes (Signed)
Occupational Therapy Note  Patient Details  Name: Ronnie Jones MRN: [id number] Date of Birth: 13-Feb-1951  Today's Date: 01/18/2022 OT Missed Time: 75 Minutes Missed Time Reason: Patient unwilling/refused to participate without medical reason;Patient fatigue  Pt in bed upon therapy arrival. States, "Today ain't worth a sh**. Refuses to participate in therapy session. Therapist suggested helping him bathe and put on a new gown, wash his hair, and complete at bed level. Pt refused and did not want Therapist doing anything to him. Patient left with Respiratory Therapist for breathing treatment.   Limmie [name], OTR/L,CBIS  Supplemental OT - [facility] and [facility]  01/18/2022, 2:45 PM

## 2022-01-19 DIAGNOSIS — R627 Adult failure to thrive: Secondary | ICD-10-CM

## 2022-01-19 DIAGNOSIS — I639 Cerebral infarction, unspecified: Secondary | ICD-10-CM

## 2022-01-19 LAB — GLUCOSE, CAPILLARY
Glucose-Capillary: 210 mg/dL — ABNORMAL HIGH (ref 70–99)
Glucose-Capillary: 159 mg/dL — ABNORMAL HIGH (ref 70–99)
Glucose-Capillary: 150 mg/dL — ABNORMAL HIGH (ref 70–99)
Glucose-Capillary: 147 mg/dL — ABNORMAL HIGH (ref 70–99)

## 2022-01-19 MED ORDER — CHLORHEXIDINE GLUCONATE CLOTH 2 % EX PADS
6.0000 | MEDICATED_PAD | Freq: Every day | CUTANEOUS | Status: DC
  Administered 2022-01-19 – 2022-01-21 (×3): 6 via TOPICAL

## 2022-01-19 MED ORDER — LORAZEPAM 0.5 MG PO TABS: 1.0000 mg | ORAL_TABLET | ORAL | Status: DC | PRN

## 2022-01-19 MED ORDER — POLYETHYLENE GLYCOL 3350 17 G PO PACK
17.0000 g | PACK | Freq: Every day | ORAL | Status: DC
  Administered 2022-01-20 – 2022-01-21 (×2): 17 g via ORAL
  Filled 2022-01-19 (×2): qty 1

## 2022-01-19 MED ORDER — MORPHINE SULFATE (CONCENTRATE) 10 MG/0.5ML PO SOLN: 5.0000 mg | ORAL | Status: DC | PRN

## 2022-01-19 MED ORDER — LORAZEPAM 2 MG/ML IJ SOLN: 1.0000 mg | INTRAMUSCULAR | Status: DC | PRN

## 2022-01-19 MED ORDER — ACETAMINOPHEN 325 MG PO TABS
650.0000 mg | ORAL_TABLET | Freq: Three times a day (TID) | ORAL | Status: DC
  Administered 2022-01-19 – 2022-01-21 (×7): 650 mg via ORAL
  Filled 2022-01-19 (×7): qty 2

## 2022-01-19 NOTE — Plan of Care (Deleted)
  Problem: RH Swallowing Goal: LTG Patient will participate in dysphagia therapy to increase swallow function with assistance (SLP) Description: LTG:  Patient will participate in dysphagia therapy to increase swallow function with assistance (SLP) Outcome: Not Applicable Note:  Pt to DC to in home hospice, goals now centering around caregiver goals. Goal: LTG Pt will demonstrate functional change in swallow as evidenced by bedside/clinical objective assessment (SLP) Description: LTG: Patient will demonstrate functional change in swallow as evidenced by bedside/clinical objective assessment (SLP) Outcome: Not Applicable Note:  Pt to DC to in home hospice, goals now centering around caregiver goals.   Problem: RH Cognition - SLP Goal: RH LTG Patient will demonstrate orientation with cues Description:  LTG:  Patient will demonstrate orientation to person/place/time/situation with cues (SLP)   Outcome: Not Applicable Note:  Pt to DC to in home hospice, goals now centering around caregiver goals.   Problem: RH Expression Communication Goal: LTG Patient will increase speech intelligibility (SLP) Description: LTG: Patient will increase speech intelligibility at word/phrase/conversation level with cues, % of the time (SLP) Outcome: Not Applicable Note:  Pt to DC to in home hospice, goals now centering around caregiver goals.   Problem: RH Problem Solving Goal: LTG Patient will demonstrate problem solving for (SLP) Description: LTG:  Patient will demonstrate problem solving for basic/complex daily situations with cues  (SLP) Outcome: Not Applicable Note:  Pt to DC to in home hospice, goals now centering around caregiver goals.   Problem: RH Memory Goal: LTG Patient will use memory compensatory aids to (SLP) Description: LTG:  Patient will use memory compensatory aids to recall biographical/new, daily complex information with cues (SLP) Outcome: Not Applicable Note:  Pt to DC to in home  hospice, goals now centering around caregiver goals.   Problem: RH Awareness Goal: LTG: Patient will demonstrate awareness during functional activites type of (SLP) Description: LTG: Patient will demonstrate awareness during functional activites type of (SLP) Outcome: Not Applicable Note:  Pt to DC to in home hospice, goals now centering around caregiver goals.   Problem: RH Swallowing Goal: LTG Patient will consume least restrictive diet using compensatory strategies with assistance (SLP) Description: LTG:  Patient will consume least restrictive diet using compensatory strategies with assistance (SLP) Flowsheets (Taken 01/19/2022 1725) LTG: Pt Patient will consume least restrictive diet using compensatory strategies with assistance of (SLP): Moderate Assistance - Patient 50 - 74%   Problem: RH Pre-functional/Other (Specify) Goal: RH LTG SLP (Specify) 1 Description: RH LTG SLP (Specify) 1 Flowsheets (Taken 01/19/2022 1729) LTG: Other SLP (Specify) 1: Caregiver will verbalize up to 3 safe swallowing safety considerations (compensatory strategies, positioning, aspiration precautions) with independence to minimize aspiration risk and maximize swallowing safety

## 2022-01-19 NOTE — Progress Notes (Signed)
Patient ID: Ronnie Jones, male   DOB: 1951-05-26, 71 y.o.   MRN: [id number]  SW made attempt to call patient spouse to remind her of scheduled family education. No answer, left detailed VM.

## 2022-01-19 NOTE — Progress Notes (Signed)
Occupational Therapy Session Note  Patient Details  Name: Ronnie Jones MRN: [id number] Date of Birth: [birth year]  Today's Date: 01/19/2022 OT Individual Time: 6606-3016 OT Individual Time Calculation (min): 55 min    Short Term Goals: Week 1:  OT Short Term Goal 1 (Week 1): Pt will report for 2 consecutive sessions < 3/10 pain OT Short Term Goal 1 - Progress (Week 1): Met OT Short Term Goal 2 (Week 1): Pt will sit EOB at least 3 mins during ADL task to promote OOB tolerance OT Short Term Goal 2 - Progress (Week 1): Met OT Short Term Goal 3 (Week 1): Pt will complete 1 step of dressing task at bed level with no more than min verbal cues OT Short Term Goal 3 - Progress (Week 1): Met Week 2:  OT Short Term Goal 1 (Week 2): Pt will don shirt with mod A. OT Short Term Goal 1 - Progress (Week 2): Not met OT Short Term Goal 2 (Week 2): Pt will don pants with mod A. OT Short Term Goal 2 - Progress (Week 2): Not met OT Short Term Goal 3 (Week 2): Pt will complete >3 grooming tasks at sink with S. OT Short Term Goal 3 - Progress (Week 2): Not met Week 3:  OT Short Term Goal 1 (Week 3): STG = LTG 2/2 ELOS  Skilled Therapeutic Interventions/Progress Updates:     Pt received semi-reclined in bed, reports having slept terrible but, agreeable to therapy for family training with wife. Session focus on self-care retraining, activity tolerance, family education in prep for improved ADL/IADL/func mobility performance + decreased caregiver burden. RN present to disconnect TF.  Wife and pt confirmed that pt would like to home, plan is for in home hospice. Demonstrated LBD at bed level, pt able to roll R with min A, max A to roll L. Reviewed application of bed pan and pericare using rolling technique.   Demonstrated two person assist hoyer transfer to and from [place], reviewed application/removal of sling, hoyer features, and safety precautions. Wife states it'll just be her at home as son works + hospice  team intermittently. Total A to don shirt and bathe UB/do hair care.  SatO2 at 94% on RA, HR at 93 bpm.  Pt left with HOB over 30 degrees with wife present, bed alarm engaged, call bell in reach, and all immediate needs met.   Therapy Documentation Precautions:  Precautions Precautions: Fall Precaution Comments: dense R hemiparesis Restrictions Weight Bearing Restrictions: No  Pain:  LLE pain when touched, unable to rate, and ceases with rest ADL: See Care Tool for more details.   Therapy/Group: Individual Therapy  Volanda Napoleon MS, OTR/L  01/19/2022, 6:49 AM

## 2022-01-19 NOTE — Progress Notes (Signed)
Daily Progress Note   Patient Name: Ronnie Jones       Date: 01/19/2022 DOB: Jan 05, 1951  Age: 71 y.o. MRN#: [id number] Attending Physician: Charlett Blake, MD Primary Care Physician: [facility]mit Date: 12/31/2021  Reason for Consultation/Follow-up: Establishing goals of care  Patient Profile/HPI:  Ronnie Jones is a 71 year old gentleman with a past medical history of ETOH use (at least 1 pint per day), COPD, HTN, tobacco use (2-3 packs per day), and vitamin D deficiency. 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 Has been in CIR for 12 days to rehabilitate. Palliative care has been asks to get involved to further aid in goals of care conversations. Patient is not thriving in rehabilitation and we have been asked to discuss PEG tube placement. PMT met on 6/12 and discussed options for hospice.     Subjective: Notified by Palliative RN that family has decided to discharge home with hospice. He does not wish to continue artificial feeding. Evaluated patient- he tells me he feels poorly. He appears SOB with increased respirations and audible secretions and congestion.  He would like the NG tube removed. I discussed using low dose morphine to help him feel better and he would like to try it.  I called his spouse Ronnie Jones. She is at home working on preparing their home for him to return home. She confirms decision for hospice and agrees with removing feeding tube.    Review of Systems  Constitutional:  Positive for malaise/fatigue and weight loss.  Respiratory:  Positive for shortness of breath.      Physical Exam Vitals and nursing note reviewed.  Constitutional:      Comments: Frail, cachetic  Neurological:     Mental Status: Mental status  is at baseline.             Vital Signs: BP (!) 112/97 (BP Location: Left Arm)   Pulse 90   Temp 97.8 F (36.6 C)   Resp 19   Ht 5' 7"  (1.702 m)   Wt 69.9 kg   SpO2 94%   BMI 24.14 kg/m  SpO2: SpO2: 94 % O2 Device: O2 Device: Room Air O2 Flow Rate: O2 Flow Rate (L/min): 2 L/min  Intake/output summary:  Intake/Output Summary (Last 24 hours) at 01/19/2022 1445 Last data  filed at 01/19/2022 0514 Gross per 24 hour  Intake 0 ml  Output 1150 ml  Net -1150 ml   LBM: Last BM Date : 01/18/22 Baseline Weight: Weight: 77.4 kg Most recent weight: Weight: 69.9 kg       Palliative Assessment/Data: PPS: 20%    Flowsheet Rows    Flowsheet Row Most Recent Value  Intake Tab   Referral Department Hospitalist  Unit at Time of Referral Other (Comment)  Date Notified 01/13/22  Palliative Care Type New Palliative care  Reason for referral Clarify Goals of Care  Date of Admission 12/31/21  Date first seen by Palliative Care 01/13/22  # of days Palliative referral response time 0 Day(s)  # of days IP prior to Palliative referral 13  Clinical Assessment   Psychosocial & Spiritual Assessment   Palliative Care Outcomes        Patient Active Problem List   Diagnosis Date Noted   ICH (intracerebral hemorrhage) ([place]) 12/31/2021   Vitamin C deficiency 12/28/2021   Vitamin D deficiency 12/26/2021   Alcohol use disorder 12/26/2021   DTs (delirium tremens) ([place]) 12/25/2021   Acute respiratory failure with hypoxia ([place]) 12/25/2021   Aspiration pneumonia ([place]) 12/25/2021   COPD with acute exacerbation ([place]) 12/25/2021   Normal anion gap metabolic acidosis 41/58/3094   Dysphagia 12/25/2021   DM type 2 (diabetes mellitus, type 2) ([place]) 12/25/2021   Hypertensive emergency 12/25/2021   Tobacco abuse 12/25/2021   Solid nodule of lung 6 mm to 8 mm in diameter 12/25/2021   Intraparenchymal hemorrhage of brain ([place]) 12/23/2021   AKI (acute kidney injury) ([place])    Acute metabolic  encephalopathy 07/68/0881    Palliative Care Assessment & Plan    Assessment/Recommendations/Plan  D/C NG tube and artificial feeding Morphine concentrate 26m/0.5mL 557moral q2hrs prn for SOB, anxiety, mod pain Lorazepam 81m33mablet sublingual 4hrs prn anxiety I dc'd some of his maintenance medications that will not affect his trajectory or comfort (rosuvastatin, vitamins, minerals) recommend continuing others as ordered [facility]OC [facility]  Prognosis:  < 6 months due to failure to thrive in the setting of [place] with po intake insufficient to support life, no plans for feeding tube, ongoing dysphagia  Discharge Planning: Home with Hospice  Care plan was discussed with patient's spouse and care team  Thank you for allowing the Palliative Medicine Team to assist in the care of this patient.  Total time:  75 minutes      Greater than 50%  of this time was spent counseling and coordinating care related to the above assessment and plan.  Ronnie KaufmanGNP-C Palliative Medicine   Please contact Palliative Medicine Team phone at [phone] questions and concerns.

## 2022-01-19 NOTE — Plan of Care (Signed)
  Problem: RH Swallowing Goal: LTG Patient will participate in dysphagia therapy to increase swallow function with assistance (SLP) Description: LTG:  Patient will participate in dysphagia therapy to increase swallow function with assistance (SLP) Outcome: Not Applicable Note:  Pt to DC to in home hospice, goals now centering around caregiver goals. Goal: LTG Pt will demonstrate functional change in swallow as evidenced by bedside/clinical objective assessment (SLP) Description: LTG: Patient will demonstrate functional change in swallow as evidenced by bedside/clinical objective assessment (SLP) Outcome: Not Applicable Note:  Pt to DC to in home hospice, goals now centering around caregiver goals.   Problem: RH Cognition - SLP Goal: RH LTG Patient will demonstrate orientation with cues Description:  LTG:  Patient will demonstrate orientation to person/place/time/situation with cues (SLP)   01/19/2022 1734 by Sonny Masters T, CCC-SLP Reactivated 01/19/2022 1728 by Sonny Masters T, CCC-SLP Outcome: Not Applicable Note:  Pt to DC to in home hospice, goals now centering around caregiver goals.   Problem: RH Expression Communication Goal: LTG Patient will increase speech intelligibility (SLP) Description: LTG: Patient will increase speech intelligibility at word/phrase/conversation level with cues, % of the time (SLP) 01/19/2022 1734 by Sonny Masters T, CCC-SLP Flowsheets (Taken 01/19/2022 1734) LTG: Patient will increase speech intelligibility (SLP): Minimal Assistance - Patient > 75% Note: Goal downgraded due to slow/inconsistent progress 01/19/2022 1734 by Sonny Masters T, CCC-SLP Reactivated 01/19/2022 1728 by Sonny Masters T, CCC-SLP Outcome: Not Applicable Note:  Pt to DC to in home hospice, goals now centering around caregiver goals.   Problem: RH Problem Solving Goal: LTG Patient will demonstrate problem solving for (SLP) Description: LTG:  Patient will  demonstrate problem solving for basic/complex daily situations with cues  (SLP) Outcome: Not Applicable Note:  Pt to DC to in home hospice, goals now centering around caregiver goals.   Problem: RH Memory Goal: LTG Patient will use memory compensatory aids to (SLP) Description: LTG:  Patient will use memory compensatory aids to recall biographical/new, daily complex information with cues (SLP) Outcome: Not Applicable Note:  Pt to DC to in home hospice, goals now centering around caregiver goals.   Problem: RH Awareness Goal: LTG: Patient will demonstrate awareness during functional activites type of (SLP) Description: LTG: Patient will demonstrate awareness during functional activites type of (SLP) 01/19/2022 1734 by Sonny Masters T, CCC-SLP Reactivated 01/19/2022 1728 by Sonny Masters T, CCC-SLP Outcome: Not Applicable Note:  Pt to DC to in home hospice, goals now centering around caregiver goals.   Problem: RH Swallowing Goal: LTG Patient will consume least restrictive diet using compensatory strategies with assistance (SLP) Description: LTG:  Patient will consume least restrictive diet using compensatory strategies with assistance (SLP) Flowsheets (Taken 01/19/2022 1725) LTG: Pt Patient will consume least restrictive diet using compensatory strategies with assistance of (SLP): Moderate Assistance - Patient 50 - 74%   Problem: RH Pre-functional/Other (Specify) Goal: RH LTG SLP (Specify) 1 Description: RH LTG SLP (Specify) 1 Flowsheets (Taken 01/19/2022 1729) LTG: Other SLP (Specify) 1: Caregiver will verbalize up to 3 safe swallowing safety considerations (compensatory strategies, positioning, aspiration precautions) with independence to minimize aspiration risk and maximize swallowing safety

## 2022-01-19 NOTE — Progress Notes (Signed)
[name]      DOB: 11-22-1950      FWY:[id number]      Palliative Medicine Team    Subjective: Bedside symptom check completed. Follow up with Wife in patient's room to help answer questions and discuss hospice options.    Physical exam: Patient resting in bed with eyes open at time of visit. Breathing even and non-labored, no excessive secretions noted. Patient without physical or non-verbal signs of pain or discomfort at this time. [place] in place, clamped at this time. Patient denies any pain, only discomfort is itching on his back. This RN assisted to roll the patient while wife applied lotion to his back.    Assessment and plan: Patient's wife, Elease Hashimoto, notified this RN that they have decided to move forward with a referral to [facility]. She also had questions regarding patient's need for a catheter prior to discharge. She asked about breathing treatments and if they are able to be continued in the home or translated to a similar inhaler regimen, this RN will follow up with provider regarding these specific concerns. Will continue to follow for any changes or advances.    Thank you for allowing the Palliative Medicine Team to assist in the care of this patient.     Shelda Jakes, MSN, RN Palliative Medicine Team Team Phone: [phone]  This phone is monitored 7a-7p, please reach out to attending physician outside of these hours for urgent needs.

## 2022-01-19 NOTE — Progress Notes (Signed)
Physical Therapy Session Note  Patient Details  Name: Ronnie Jones MRN: [id number] Date of Birth: 30-Mar-1951  Today's Date: 01/19/2022 PT Individual Time: 9826-4158 PT Individual Time Calculation (min): 22 min   Short Term Goals: Week 3:  PT Short Term Goal 1 (Week 3): Pt will sit EOB with mod assist up to 5 minutes. PT Short Term Goal 2 (Week 3): Pt will transfer to [facility] with mod assist on slide board PT Short Term Goal 3 (Week 3): Pt will tolerate sitting in WC >2 hours between therapies PT Short Term Goal 4 (Week 3): Family education will begin  Skilled Therapeutic Interventions/Progress Updates: Pt presented in bed with nsg present providing meds. Pt denies pain but c/o SOB (vitals stable). Per wife and notes pt will ultimately d/c home with hospice therefore session focused on education in preparation. As pt was receiving meds from nsg PTA and wife performed practice hoyer lift transfer in ortho gym with PTA placing wife in hoyer sling to provide sensation of being in hoyer as wife and OT performed hoyer transfer to TIS earlier this am. Upon return to room pt refused to have wife practice hoyer on pt including just rolling L/R for hoyer pad placement. Education continued regarding repositioning, and encouraging OOB x 1/day for pressure relief. Wife also advised anticipates participating in family ed Thurs/Fri as originally planned. As pt refused any intervention PTA repositioned pt to allow for pressure relief and left pt in bed with call bell within reach and wife present.      Therapy Documentation Precautions:  Precautions Precautions: Fall Precaution Comments: dense R hemiparesis Restrictions Weight Bearing Restrictions: No General: PT Amount of Missed Time (min): 23 Minutes Vital Signs: Oxygen Therapy SpO2: 94 % O2 Device: Room Air Pain:   Mobility:   Locomotion :    Trunk/Postural Assessment :    Balance:   Exercises:   Other Treatments:      Therapy/Group:  Individual Therapy  Amariz Flamenco 01/19/2022, 1:37 PM

## 2022-01-19 NOTE — Progress Notes (Signed)
Wife in room RN talked to her re: I and cath teaching she said she will not be able to do the I and O cath; requesting for patient to have foley on discharge. OT in room.

## 2022-01-19 NOTE — Plan of Care (Signed)
  Problem: RH BOWEL ELIMINATION Goal: RH STG MANAGE BOWEL WITH ASSISTANCE Description: STG Manage Bowel with min  Assistance. Outcome: Not Progressing; incontinence   Problem: RH BLADDER ELIMINATION Goal: RH STG MANAGE BLADDER WITH ASSISTANCE Description: STG Manage Bladder With Assistance Outcome: Not Progressing; incontinence

## 2022-01-19 NOTE — Plan of Care (Signed)
Problem: RH Balance Goal: LTG: Patient will maintain dynamic sitting balance (OT) Description: LTG:  Patient will maintain dynamic sitting balance with assistance during activities of daily living (OT) Outcome: Not Applicable Flowsheets (Taken 01/19/2022 1242) LTG: Pt will maintain dynamic sitting balance during ADLs with: (Pt to DC to in home hospice, goals now centering around caregiver goals.) -- Note: Pt to DC to in home hospice, goals now centering around caregiver goals.   Problem: Sit to Stand Goal: LTG:  Patient will perform sit to stand in prep for activites of daily living with assistance level (OT) Description: LTG:  Patient will perform sit to stand in prep for activites of daily living with assistance level (OT) Outcome: Not Applicable Flowsheets (Taken 01/19/2022 1242) LTG: PT will perform sit to stand in prep for activites of daily living with assistance level: (Pt to DC to in home hospice, goals now centering around caregiver goals.) -- Note: Pt to DC to in home hospice, goals now centering around caregiver goals.   Problem: RH Grooming Goal: LTG Patient will perform grooming w/assist,cues/equip (OT) Description: LTG: Patient will perform grooming with assist, with/without cues using equipment (OT) Outcome: Not Applicable Flowsheets (Taken 01/19/2022 1242) LTG: Pt will perform grooming with assistance level of: (Pt to DC to in home hospice, goals now centering around caregiver goals.) -- Note: Pt to DC to in home hospice, goals now centering around caregiver goals.   Problem: RH Bathing Goal: LTG Patient will bathe all body parts with assist levels (OT) Description: LTG: Patient will bathe all body parts with assist levels (OT) Outcome: Not Applicable Flowsheets (Taken 01/19/2022 1242) LTG: Pt will perform bathing with assistance level/cueing: (Pt to DC to in home hospice, goals now centering around caregiver goals.) -- Note: Pt to DC to in home hospice, goals now  centering around caregiver goals.   Problem: RH Dressing Goal: LTG Patient will perform lower body dressing w/assist (OT) Description: LTG: Patient will perform lower body dressing with assist, with/without cues in positioning using equipment (OT) Outcome: Not Applicable Flowsheets (Taken 01/19/2022 1242) LTG: Pt will perform lower body dressing with assistance level of: (Pt to DC to in home hospice, goals now centering around caregiver goals.) -- Note: Pt to DC to in home hospice, goals now centering around caregiver goals.   Problem: RH Toileting Goal: LTG Patient will perform toileting task (3/3 steps) with assistance level (OT) Description: LTG: Patient will perform toileting task (3/3 steps) with assistance level (OT)  Outcome: Not Applicable Flowsheets (Taken 01/19/2022 1242) LTG: Pt will perform toileting task (3/3 steps) with assistance level: (Pt to DC to in home hospice, goals now centering around caregiver goals.) -- Note: Pt to DC to in home hospice, goals now centering around caregiver goals.   Problem: RH Vision Goal: RH LTG Vision (Specify) Outcome: Not Applicable Flowsheets (Taken 01/19/2022 1242) LTG: Vision Goals: (Pt to DC to in home hospice, goals now centering around caregiver goals.) -- Note: Pt to DC to in home hospice, goals now centering around caregiver goals.   Problem: RH Toilet Transfers Goal: LTG Patient will perform toilet transfers w/assist (OT) Description: LTG: Patient will perform toilet transfers with assist, with/without cues using equipment (OT) Outcome: Not Applicable Flowsheets (Taken 01/19/2022 1242) LTG: Pt will perform toilet transfers with assistance level of: (Pt to DC to in home hospice, goals now centering around caregiver goals.) -- Note: Pt to DC to in home hospice, goals now centering around caregiver goals.   Problem: RH Dressing Goal: LTG  Patient will perform upper body dressing (OT) Description: LTG Patient will perform upper body  dressing with assist, with/without cues (OT). Flowsheets (Taken 01/19/2022 1242) LTG: Pt will perform upper body dressing with assistance level of: (Pt to DC to in home hospice, goals now centering around caregiver goals.) -- Note: Pt to DC to in home hospice, goals now centering around caregiver goals.   Problem: RH Pre-functional/Other (Specify) Goal: RH LTG OT (Specify) 1 Description: RH LTG OT (Specify) 1 Flowsheets (Taken 01/19/2022 1242) LTG: Other OT (Specify) 1: Caregiver will demonstrate bed pan application to pt with independence. Goal: RH LTG OT (Specify) 2 Description: RH LTG OT (Specify) 2  Flowsheets (Taken 01/19/2022 1242) LTG: Other OT (Specify) 2: Caregiver will demonstrate how to reposition pt in R or L side-lying to decrease risk for pressure injuries with independence.

## 2022-01-19 NOTE — Progress Notes (Signed)
PROGRESS NOTE   Subjective/Complaints:   States he did not sleep well but sleep chart showing 7-8hr  ROS: Limited due to cognitive/behavioral   Objective:   No results found.  No results for input(s): "WBC", "HGB", "HCT", "PLT" in the last 72 hours.  Recent Labs    01/18/22 0505  NA 137  K 4.0  CL 101  CO2 24  GLUCOSE 199*  BUN 62*  CREATININE 1.36*  CALCIUM 9.4      Intake/Output Summary (Last 24 hours) at 01/19/2022 0737 Last data filed at 01/19/2022 0514 Gross per 24 hour  Intake 100 ml  Output 1650 ml  Net -1550 ml         Physical Exam: Vital Signs Blood pressure (!) 112/97, pulse 90, temperature 97.8 F (36.6 C), resp. rate 19, height 5\' 7"  (1.702 m), weight 69.9 kg, SpO2 92 %.   General: No acute distress Mood and affect are appropriate Heart: Regular rate and rhythm no rubs murmurs or extra sounds Lungs: Clear to auscultation, breathing unlabored, no rales or wheezes Abdomen: Positive bowel sounds, soft nontender to palpation, nondistended Extremities: No clubbing, cyanosis, or edema Skin: No evidence of breakdown, no evidence of rash  Neuro: Cranial nerves II through XII intact, alert and oriented x3 with ongoing dysarthria, follows simple commands with extra time motor strength is 5 out of 5 in left and 0 of 5 right deltoid, bicep, tricep, trace grip, hip flexor, knee extensors, ankle dorsiflexor and plantar flexor Some trace RIght hip add and shoulder add--no real changes today  Tone MAS 1-2 at Right elbow flexors--stable motor ex  Assessment/Plan: 1. Functional deficits which require 3+ hours per day of interdisciplinary therapy in a comprehensive inpatient rehab setting. Physiatrist is providing close team supervision and 24 hour management of active medical problems listed below. Physiatrist and rehab team continue to assess barriers to discharge/monitor patient progress toward  functional and medical goals  Care Tool:  Bathing  Bathing activity did not occur: Refused Body parts bathed by patient: Face, Left upper leg, Abdomen, Chest, Right arm   Body parts bathed by helper: Left arm, Front perineal area, Buttocks, Left upper leg, Right lower leg, Left lower leg     Bathing assist Assist Level: Moderate Assistance - Patient 50 - 74%     Upper Body Dressing/Undressing Upper body dressing   What is the patient wearing?: Pull over shirt    Upper body assist Assist Level: Maximal Assistance - Patient 25 - 49%    Lower Body Dressing/Undressing Lower body dressing      What is the patient wearing?: Pants, Incontinence brief     Lower body assist Assist for lower body dressing: Total Assistance - Patient < 25%     Toileting Toileting    Toileting assist Assist for toileting: Total Assistance - Patient < 25%     Transfers Chair/bed transfer  Transfers assist     Chair/bed transfer assist level: Maximal Assistance - Patient 25 - 49% (squat pivot)     Locomotion Ambulation   Ambulation assist   Ambulation activity did not occur: Safety/medical concerns  Assist level: 2 helpers Assistive device: Other (comment) (L hallway rail)  Max distance: 60ft   Walk 10 feet activity   Assist  Walk 10 feet activity did not occur: Safety/medical concerns        Walk 50 feet activity   Assist Walk 50 feet with 2 turns activity did not occur: Safety/medical concerns         Walk 150 feet activity   Assist Walk 150 feet activity did not occur: Safety/medical concerns         Walk 10 feet on uneven surface  activity   Assist Walk 10 feet on uneven surfaces activity did not occur: Safety/medical concerns         Wheelchair     Assist Is the patient using a wheelchair?: Yes Type of Wheelchair: Manual    Wheelchair assist level: Dependent - Patient 0% Max wheelchair distance: 150    Wheelchair 50 feet with 2 turns  activity    Assist        Assist Level: Dependent - Patient 0%   Wheelchair 150 feet activity     Assist      Assist Level: Dependent - Patient 0%   Blood pressure (!) 112/97, pulse 90, temperature 97.8 F (36.6 C), resp. rate 19, height 5\' 7"  (1.702 m), weight 69.9 kg, SpO2 92 %.   Medical Problem List and Plan: 1. 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 Intensity 15/7             -ELOS 6/21  , Mod A to sup A 2.  Antithrombotics: -DVT/anticoagulation:  Pharmaceutical: d/c anticoaguluation hx ICH, DVT resolved              5/27- R brachial DVT- resolved on vascular imaging 01/11/22 3. Pain Management: Tylenol as needed 4. Mood: LCSW to evaluate and provide emotional support             -antipsychotic agents: n/a 5. Neuropsych: This patient is capable of making decisions on his own behalf. 6. Skin/Wound Care: Routine skin care checks 7. Fluids/Electrolytes/Nutrition: Routine Is and Os and follow-up chemistries             --dysphagia 1; honey thick- Has Cortrak in place and receiving TF's.  6/11 tolerating, cont TF while pt at rehab 8: T2DM: Hgb A1c- metformin on hold due to AKI/variable intake (puree diet). --Monitor BS ac/hs and use SSI for elevated BS. --Changed TF to 6 pm-6 am CBG (last 3)  Recent Labs    01/18/22 1640 01/18/22 2025 01/19/22 0603  GLUCAP 202* 159* 210*   CBGs inb range 6/12 9: Hypertension: Off Lisinopril due to AKI/hyperkalemia  --continue amlodipine, atenolol, hydralazine Vitals:   01/19/22 0501 01/19/22 0503  BP:  (!) 112/97  Pulse: 88 90  Resp:  19  Temp:  97.8 F (36.6 C)  SpO2: 94% 92%   6/13 on cardura 1mg  qhs, amlodipine  10mg   and   hydralazine   50 mg TID-  -good control on current meds 10:  COPD: Continue Anoro Ellipta daily.  Flutter valve  qid. Smoked 2ppd - CXR OK  --now off prednisone, no wheezing . --Duoneb prn   11: Alcohol abuse: cessation counseling. Off phenobarb. Check Thiamine level.  --continue B12, thiamine and folic acid 12: Tobacco abuse: cessation counseling --continue nicotine  patch 13: Hyperlipidemia: LDL- 192 and Trig- 499-->continue Crestor 40 mg 14: AKI may bhave CKD : Baseline SCr-?~1.36 --Encourage --drinking honey liquids but not touching puree's.              --continue water flushes qid via cortak -Creat bumped up to 1.97- cont H2O flushes 200ml q 4 Na+ 134, hesitate increasing this , alb a little low but doubt significant 3rd spacing -Cr fluctuating 1.4-1.5 range     Latest Ref Rng & Units 01/18/2022    5:05 AM 01/12/2022    5:17 AM 01/11/2022    6:06 AM  BMP  Glucose 70 - 99 mg/dL 161199  096147  045172   BUN 8 - 23 mg/dL 62  43  34   Creatinine 0.61 - 1.24 mg/dL 4.091.36  8.111.47  9.141.40   Sodium 135 - 145 mmol/L 137  129  129   Potassium 3.5 - 5.1 mmol/L 4.0  4.6  5.5   Chloride 98 - 111 mmol/L 101  98  97   CO2 22 - 32 mmol/L 24  20  23    Calcium 8.9 - 10.3 mg/dL 9.4  8.7  8.8    BMET with elevated BUN increase free H2O- recheck in am  15. Vitamin C deficiency: On daily supplement 16. Vitamin D deficiency: Continue weekly supplement.   17. Spasticity- overall improving  On tizanidine 2mg  TID - may be contributing to tiredness - this was d/ced but pt remains somnolent most likely CVA related  18. RUE swelling/weeping- resolved, RIght brachial DVT seen on vascular US 5/26,improving  19.  Abd distension -  , CT abd pelvis showed retroperitoneal bleed in right psoas muscle no sig drop in hgb   20.  Urinary retention requiring I/O cath, alpha blocker , may need to go home with foley, given potential for Hospice care in home will need to determine wishes on this  prior to D/C- DNR  21.  Insomnia some am somnolence with mirtazepine (although has this as part of CVA sx as well) , no response to trazodone, will trial  low dose temezepam  6/11 did not take temazepam last night or previous   -slept 5hr+ last night LOS: 19 days A FACE TO FACE EVALUATION WAS PERFORMED  Erick Colacendrew E Eliberto Sole 01/19/2022, 7:37 AM

## 2022-01-19 NOTE — Progress Notes (Signed)
Speech Language Pathology Daily Session Note  Patient Details  Name: Ronnie Jones MRN: [id number] Date of Birth: [birth year]  Today's Date: 01/19/2022 SLP Individual Time: 7124-5809 SLP Individual Time Calculation (min): 45 min  Short Term Goals: Week 3: SLP Short Term Goal 1 (Week 3): Patient will be oriented x4 with min A for use of external aids SLP Short Term Goal 2 (Week 3): Patient will exhibit recall of recent information with mod A verbal/visual cues for use of external aids SLP Short Term Goal 3 (Week 3): Patient will complete basic problem solving with mod A verbal/visual cues SLP Short Term Goal 4 (Week 3): Patient will consume current diet (Dys 3, HTL) without overt s/sx of aspiration with min A verbal cues for use of swallowing compensatory strategies  Skilled Therapeutic Interventions: Skilled ST treatment focused on cognitive-communication and swallowing goals. Pt received semi-reclined in bed on arrival. Pt required consistent encouragement to participate, as interest appeared limited. Pt stated everything has been going "bad", he hasn't left his bed in days (not true - exited room for PT session earlier today), and that "there's nothing else you can do for me here." Pt with overall negative mindset and appeared sad. Pt requested for SLP to scratch his back. SLP asked pt to reach + pull bed rail with L hand. Pt initially stated "I can't do anything" however eventually able to roll with min-to-mod A and further verbal encouragement. Pt agreeable to consume sips of honey thick liquids without overt s/sx of aspiration. Unwilling to consume additional PO intake despite stating he did not consume breakfast nor lunch today. Intake was not yet inputted into flowsheet as of 1715 to confirm. Pt required overall max A to participate in basic problem solving scenarios. Patient was left in bed with alarm activated and immediate needs within reach at end of session. Continue per current plan of  care.      Pain    Therapy/Group: Individual Therapy  Tamala Ser 01/19/2022, 5:04 PM

## 2022-01-20 LAB — GLUCOSE, CAPILLARY
Glucose-Capillary: 165 mg/dL — ABNORMAL HIGH (ref 70–99)
Glucose-Capillary: 259 mg/dL — ABNORMAL HIGH (ref 70–99)
Glucose-Capillary: 212 mg/dL — ABNORMAL HIGH (ref 70–99)
Glucose-Capillary: 126 mg/dL — ABNORMAL HIGH (ref 70–99)

## 2022-01-20 MED ORDER — MIRTAZAPINE 15 MG PO TBDP: 15.0000 mg | ORAL_TABLET | Freq: Every day | ORAL | 0 refills | Status: DC

## 2022-01-20 MED ORDER — UMECLIDINIUM-VILANTEROL 62.5-25 MCG/ACT IN AEPB: 1.0000 | INHALATION_SPRAY | Freq: Every day | RESPIRATORY_TRACT | 0 refills | Status: AC

## 2022-01-20 MED ORDER — BUDESONIDE 0.5 MG/2ML IN SUSP: 0.5000 mg | Freq: Two times a day (BID) | RESPIRATORY_TRACT | 12 refills | Status: AC

## 2022-01-20 MED ORDER — LORAZEPAM 1 MG PO TABS: 1.0000 mg | ORAL_TABLET | ORAL | 0 refills | Status: AC | PRN

## 2022-01-20 MED ORDER — SENNOSIDES-DOCUSATE SODIUM 8.6-50 MG PO TABS: 1.0000 | ORAL_TABLET | Freq: Two times a day (BID) | ORAL | 0 refills | Status: DC

## 2022-01-20 MED ORDER — METHOCARBAMOL 500 MG PO TABS: 500.0000 mg | ORAL_TABLET | Freq: Four times a day (QID) | ORAL | 0 refills | Status: AC | PRN

## 2022-01-20 MED ORDER — ALBUTEROL SULFATE (2.5 MG/3ML) 0.083% IN NEBU
2.5000 mg | INHALATION_SOLUTION | Freq: Four times a day (QID) | RESPIRATORY_TRACT | Status: DC | PRN
Start: 2022-01-20 — End: 2022-01-21
  Administered 2022-01-20: 2.5 mg via RESPIRATORY_TRACT
  Filled 2022-01-20 (×2): qty 3

## 2022-01-20 MED ORDER — MORPHINE SULFATE (CONCENTRATE) 10 MG/0.5ML PO SOLN: 5.0000 mg | ORAL | 0 refills | Status: AC | PRN

## 2022-01-20 MED ORDER — ALBUTEROL SULFATE (2.5 MG/3ML) 0.083% IN NEBU: 2.5000 mg | INHALATION_SOLUTION | Freq: Four times a day (QID) | RESPIRATORY_TRACT | 12 refills | Status: AC | PRN

## 2022-01-20 MED ORDER — CHLORHEXIDINE GLUCONATE 0.12 % MT SOLN: 15.0000 mL | Freq: Two times a day (BID) | OROMUCOSAL | 0 refills | Status: AC

## 2022-01-20 NOTE — Plan of Care (Signed)
  Problem: RH Dressing Goal: LTG Patient will perform upper body dressing (OT) Description: LTG Patient will perform upper body dressing with assist, with/without cues (OT). Outcome: Not Met (add Reason) Note: Pt DC home on hospice, will most likely be bed bound.   Problem: RH Pre-functional/Other (Specify) Goal: RH LTG OT (Specify) 1 Description: RH LTG OT (Specify) 1 Outcome: Not Met (add Reason)   Problem: RH Pre-functional/Other (Specify) Goal: RH LTG OT (Specify) 2 Description: RH LTG OT (Specify) 2  Outcome: Completed/Met

## 2022-01-20 NOTE — Progress Notes (Addendum)
Occupational Therapy Discharge Summary  Patient Details  Name: Ronnie Jones MRN: [id number] Date of Birth: 07/26/1951   Patient / CG have met 2 of 2 long term goals. Patient to discharge at overall Total Assist and will complete ADL at bed level. Pt to DC earlier than originally anticipated to home with in home hospice support. Patient's care partner is independent to provide the necessary physical and cognitive assistance at discharge.  Pt to DC at the below ADL/bathroom transfer performance level. Family education completed on 6/13 and 6/14 with wife demonstrating good understanding of facilitating bed level ADL. She will continue to benefit from hospice team for further bed mobility and hoyer lift training and she will be the sole primary CG.   Reasons goals not met: Pt to DC earlier than originally anticipated to home with in home hospice support  due to decline in mobility/function and pt desire to no longer pursue aggressive therapy and be at home.   Recommendation: no further OT rec, pt/family will benefit from hospice support  Equipment: No equipment provided, hospice to provide al lDME  Reasons for discharge: discharge from hospital  Patient/family agrees with progress made and goals achieved: Yes  OT Discharge Precautions/Restrictions  Precautions Precautions: Fall Precaution Comments: dense R hemiparesis Restrictions Weight Bearing Restrictions: No  ADL ADL Eating: Supervision/safety Where Assessed-Eating: Bed level Grooming: Maximal assistance Where Assessed-Grooming: Bed level Upper Body Bathing: Maximal assistance, Dependent Where Assessed-Upper Body Bathing: Bed level Lower Body Bathing: Dependent, Maximal assistance Where Assessed-Lower Body Bathing: Bed level Upper Body Dressing: Maximal assistance, Dependent Where Assessed-Upper Body Dressing: Bed level Lower Body Dressing: Maximal assistance, Dependent Where Assessed-Lower Body Dressing: Bed  level Toileting: Maximal assistance, Dependent Where Assessed-Toileting: Bed level Toilet Transfer: Unable to assess Tub/Shower Transfer: Unable to assess Tub/Shower Transfer Method: Unable to assess Social research officer, government: Unable to assess Social research officer, government Method: Unable to assess Vision Baseline Vision/History: 1 Wears glasses (readers) Patient Visual Report: No change from baseline Vision Assessment?: Yes Eye Alignment: Within Functional Limits Alignment/Gaze Preference: Within Defined Limits Additional Comments: R inattention Perception  Inattention/Neglect: Does not attend to right side of body;Does not attend to right visual field Praxis Praxis: Intact Cognition Cognition Overall Cognitive Status: Impaired/Different from baseline Arousal/Alertness: Awake/alert Orientation Level: Person;Place;Situation Person: Oriented Place: Oriented Situation: Oriented Memory: Impaired Memory Impairment: Decreased short term memory;Storage deficit;Retrieval deficit;Decreased recall of new information Decreased Short Term Memory: Verbal basic;Functional basic Attention: Sustained Sustained Attention: Impaired Sustained Attention Impairment: Verbal basic Awareness: Impaired Awareness Impairment: Intellectual impairment Problem Solving: Impaired Problem Solving Impairment: Verbal basic Safety/Judgment: Impaired Brief Interview for Mental Status (BIMS) Repetition of Three Words (First Attempt): 3 Temporal Orientation: Year: Correct Temporal Orientation: Month: Missed by 6 days to 1 month Temporal Orientation: Day: Incorrect Recall: "Sock": No, could not recall Recall: "Blue": Yes, after cueing ("a color") Recall: "Bed": Yes, after cueing ("a piece of furniture") BIMS Summary Score: 9 Sensation Sensation Light Touch: Impaired by gross assessment Peripheral sensation comments: Light and deep touch impaired on RUE/RLE, pt reports "numbness" Light Touch Impaired Details:  Absent RUE;Absent RLE Hot/Cold: Not tested Proprioception: Impaired by gross assessment Proprioception Impaired Details: Impaired RLE;Impaired RUE Stereognosis: Impaired by gross assessment Additional Comments: dense R hemi, pusher to the R Coordination Gross Motor Movements are Fluid and Coordinated: No Fine Motor Movements are Fluid and Coordinated: No Coordination and Movement Description: dense R hemi, pusher to the R Motor  Motor Motor: Hemiplegia;Abnormal tone;Abnormal postural alignment and control Motor - Discharge Observations: dense R hemi,  pusher to the R, increased flexor tone RUE Mobility  Bed Mobility Bed Mobility: Rolling Right;Rolling Left;Right Sidelying to Sit;Sit to Supine;Scooting to [facility] Rolling Right: Minimal Assistance - Patient > 75% Rolling Left: Total Assistance - Patient < 25% Right Sidelying to Sit: 2 Helpers Supine to Sit: 2 Helpers Sit to Supine: 2 Helpers Scooting to [facility]: 2 Helpers Transfers Sit to Stand: 2 Helpers Stand to Sit: 2 Helpers  Trunk/Postural Assessment  Cervical Assessment Cervical Assessment: Exceptions to [facility] (forward head) Thoracic Assessment Thoracic Assessment: Exceptions to [facility] (rounded shoulders, R lean) Lumbar Assessment Lumbar Assessment: Exceptions to [facility] (posterior pelvic tilt in sitting) Postural Control Postural Control: Deficits on evaluation (R pusher)  Balance Balance Balance Assessed: Yes Static Sitting Balance Static Sitting - Balance Support: Feet supported Static Sitting - Level of Assistance: 1: +2 Total assist Static Standing Balance Static Standing - Balance Support: Bilateral upper extremity supported Static Standing - Level of Assistance: 1: +2 Total assist Extremity/Trunk Assessment RUE Assessment RUE Assessment: Exceptions to [facility] RUE Body System: Neuro Brunstrum levels for arm and hand: Arm;Hand Brunstrum level for arm: Stage II Synergy is developing Brunstrum level for hand: Stage I  Flaccidity RUE Tone RUE Tone: Hypertonic LUE Assessment LUE Assessment: Exceptions to [facility] General Strength Comments: generalized weakness   Volanda Napoleon MS, OTR/L  01/20/2022, 1:01 PM

## 2022-01-20 NOTE — Progress Notes (Signed)
   This pt was referred to [facility] for hospice care at home upon d/c. I have contacted family -wife and confirmed interest in hospice care. She confirms pt is DNR. She is in agreement to the services at home. I have reviewed chart and discussed with my MD who does approve the pt for hospice care.   I have ordered equipment to be delivered from [employer]. Hoyer lift, hospital bed with 1/2 rails, Over bed table, reclining w/c and nebulizer. I have requested this be delivered today to not impede d/c plans tomorrow.   Norm Parcel RN [phone]

## 2022-01-20 NOTE — Progress Notes (Signed)
Patient ID: Ronnie Jones, male   DOB: 1951/03/25, 71 y.o.   MRN: [id number]  Sw spoke with Dennard Nip at [facility] and was informed patient DME has been ordered and planned for delivery tonight. SW will receive a follow up call from Cheri in the morning to confirm delivery and Sw will then arrange transport.

## 2022-01-20 NOTE — Progress Notes (Addendum)
Occupational Therapy Session Note  Patient Details  Name: Ronnie Jones MRN: [id number] Date of Birth: 01-Jul-1951  Today's Date: 01/20/2022 OT Individual Time: 8592-9244 OT Individual Time Calculation (min): 48 min    Short Term Goals: Week 1:  OT Short Term Goal 1 (Week 1): Pt will report for 2 consecutive sessions < 3/10 pain OT Short Term Goal 1 - Progress (Week 1): Met OT Short Term Goal 2 (Week 1): Pt will sit EOB at least 3 mins during ADL task to promote OOB tolerance OT Short Term Goal 2 - Progress (Week 1): Met OT Short Term Goal 3 (Week 1): Pt will complete 1 step of dressing task at bed level with no more than min verbal cues OT Short Term Goal 3 - Progress (Week 1): Met Week 2:  OT Short Term Goal 1 (Week 2): Pt will don shirt with mod A. OT Short Term Goal 1 - Progress (Week 2): Not met OT Short Term Goal 2 (Week 2): Pt will don pants with mod A. OT Short Term Goal 2 - Progress (Week 2): Not met OT Short Term Goal 3 (Week 2): Pt will complete >3 grooming tasks at sink with S. OT Short Term Goal 3 - Progress (Week 2): Not met Week 3:  OT Short Term Goal 1 (Week 3): STG = LTG 2/2 ELOS  Skilled Therapeutic Interventions/Progress Updates:    Pt received semi-reclined in bed with wife present, agreeable to therapy for family training. SatO2 at 94%, HR at 89 bpm.   Wife facilitated rolling R/L in bed with overall S assist from therapist. Discussed rolling R/L for bed pan/toileting/LBD strategies and for skin pressure relief purposes. Rec +2 with use of slide sheet to boost up in bed to avoid CG injury.   Pt does not have additional furniture or custom w/c upon DC to sit in at this time, but reviewed application of hoyer sling in case of emergency/ in event of pt wishing to exit bed. Wife able to don sling with mod A for recall from therapist and oriented to hoyer parts/how to lift.  Total A of 2 to boost up in bed. Reviewed necessity of needing incontinence briefs, twin sheets  for hospital bed, gloves for herself for pericare, and slide sheets. Wife plans to purchase necessary items today.  Pt left with HOB over 30 degrees with wife present, bed alarm engaged, call bell in reach, and all immediate needs met.  Wife present.   Therapy Documentation Precautions:  Precautions Precautions: Fall Precaution Comments: dense R hemiparesis Restrictions Weight Bearing Restrictions: No  Pain: no c/o but endorses difficulty breathing when lying flight   ADL: See Care Tool for more details.  Therapy/Group: Individual Therapy  Volanda Napoleon MS, OTR/L  01/20/2022, 6:51 AM

## 2022-01-20 NOTE — Progress Notes (Signed)
Physical Therapy Discharge Summary  Patient Details  Name: Ronnie Jones MRN: [id number] Date of Birth: Aug 23, 1950  Today's Date: 01/20/2022 PT Individual Time: 1420-1430 PT Individual Time Calculation (min): 10 min    Patient has met 1 of 9 long term goals due to decline in functional status and transition to hospice care at d/c .  Patient to discharge at  bed level  level Max Assist.   Patient's care partner is independent to provide the necessary physical assistance at discharge.  Reasons goals not met: decline in functional status and d/c with hospice care   Recommendation:  Patient will not receive ongoing skilled PT due to transition to hospice care   Equipment: No equipment provided (hospice services provided)  Reasons for discharge: lack of progress toward goals and discharge from hospital  Patient/family agrees with progress made and goals achieved: Yes  PT Discharge Precautions/Restrictions   Vital Signs Therapy Vitals Temp: 98 F (36.7 C) Temp Source: Oral Pulse Rate: 72 Resp: 16 BP: 134/70 Patient Position (if appropriate): Lying Oxygen Therapy SpO2: 92 % O2 Device: Room Air Pain Pain Assessment Pain Scale: 0-10 Pain Score: 0-No pain Pain Interference Pain Interference Pain Effect on Sleep: 1. Rarely or not at all Pain Interference with Therapy Activities: 1. Rarely or not at all Pain Interference with Day-to-Day Activities: 1. Rarely or not at all Vision/Perception  Vision - Assessment Eye Alignment: Within Functional Limits Alignment/Gaze Preference: Within Defined Limits Tracking/Visual Pursuits: Requires cues, head turns, or add eye shifts to track Additional Comments: R inattention Perception Perception: Impaired Inattention/Neglect: Does not attend to right side of body;Does not attend to right visual field Praxis Praxis: Impaired Praxis Impairment Details: Initiation  Cognition   Sensation Sensation Light Touch: Impaired by gross  assessment Peripheral sensation comments: Light and deep touch impaired on RUE/RLE, pt reports "numbness" Light Touch Impaired Details: Absent RUE;Impaired RLE Hot/Cold: Not tested Proprioception: Impaired by gross assessment Proprioception Impaired Details: Impaired RLE;Impaired RUE Stereognosis: Impaired by gross assessment Additional Comments: dense R hemi, pusher to the R Coordination Gross Motor Movements are Fluid and Coordinated: No Fine Motor Movements are Fluid and Coordinated: No Coordination and Movement Description: dense R hemi, pusher to the R Heel Shin Test: unable to perform on the R, unwilling to perform on the LLE Motor  Motor Motor: Hemiplegia;Abnormal tone;Abnormal postural alignment and control Motor - Discharge Observations: dense R hemi, pusher to the R, increased flexor tone RUE  Mobility Bed Mobility Rolling Right: Minimal Assistance - Patient > 75% Rolling Left: Maximal Assistance - Patient 25-49% Right Sidelying to Sit: Maximal Assistance - Patient 25-49% Supine to Sit: Maximal Assistance - Patient - Patient 25-49% Sit to Supine: Maximal Assistance - Patient 25-49% Transfers Sit to Stand: 2 Helpers Transfer via Lift Equipment: Animal nutritionist: No Gait Assistance Details: pt refused to perform any out of bed mobility on this day Gait Gait: No Stairs / Additional Locomotion Stairs: No Pick up small object from the floor (from standing position) activity did not occur: Safety/medical Producer, television/film/video Mobility: No Wheelchair Assistance: Dependent - Patient 0%  Trunk/Postural Assessment  Cervical Assessment Cervical Assessment: Exceptions to [facility] (forward head) Thoracic Assessment Thoracic Assessment: Exceptions to [facility] (rounded shoulders, R lean) Lumbar Assessment Lumbar Assessment: Exceptions to [facility] (posterior pelvic tilt in sitting) Postural Control Postural Control: Deficits on evaluation (R pusher)   Balance Balance Balance Assessed: Yes Dynamic Sitting Balance Dynamic Sitting - Level of Assistance: 3: Mod assist;2: Max assist Static Standing Balance  Static Standing - Level of Assistance: 1: +2 Total assist Extremity Assessment      RLE Assessment RLE Assessment: Exceptions to [facility] General Strength Comments: 0/5 LLE Assessment LLE Assessment: Exceptions to [facility] General Strength Comments: 4-/5 proximal to distal    [name] 01/20/2022, 6:12 PM

## 2022-01-20 NOTE — Patient Care Conference (Signed)
Inpatient RehabilitationTeam Conference and Plan of Care Update Date: 01/20/2022   Time: 10:07 AM    Patient Name: Ronnie Jones      Medical Record Number: [id number]  Date of Birth: Sep 02, 1950 Sex: Male         Room/Bed: 4M08C/4M08C-01 Payor Info: Payor: [employer] MEDICARE / Plan: [employer] MEDICARE HMO/PPO / Product Type: *No Product type* /    Admit Date/Time:  12/31/2021  5:31 PM  Primary Diagnosis:  Intraparenchymal hemorrhage of brain [facility])  Hospital Problems: Principal Problem:   Intraparenchymal hemorrhage of brain (HCC) Active Problems:   Aspiration pneumonia (HCC)   COPD with acute exacerbation (HCC)   Dysphagia   ICH (intracerebral hemorrhage) [facility])    Expected Discharge Date: Expected Discharge Date: 01/21/22  Team Members Present: Physician leading conference: Dr. Claudette Laws Social Worker Present: Lavera Guise, BSW Nurse Present: Chana Bode, RN PT Present: Grier Rocher, PT OT Present: Annye English, OT SLP Present: Eilene Ghazi, SLP PPS Coordinator present : Fae Pippin, SLP     Current Status/Progress Goal Weekly Team Focus  Bowel/Bladder     Incontinent of bowel and foley placed for bladder management   Wife able to manage incontinence   Foley care/peri care educ and bowel management/toileting protocol  Swallow/Nutrition/ Hydration   dys 2 textures, honey thick liquids  mod A  Family education on safe swallowing precautions/strategies/compensations to minimize aspiration risk and maximize swallowing safety   ADL's   total A of 2, transfers in hoyer, family education initiated with wife, continues to be very fatigued/unable to tolerate OOB for very long  DC BADL goals, focus on CG provided ADL/transfer/positioning assist  family education   Mobility   max to total A x 2 for all mobility  mod assist overall for transfers and bed mobility - may be downgraded due to poor prognosis  family education in preparation for d/c.   Communication    Min A  min A  speech intelligibility strategies   Safety/Cognition/ Behavioral Observations     sup A for orientation and intellectual awareness  - other goals discontinued due to poor participation/interest and pt discharging home on hospice thus focusing on education with spouse  family education prior to dc   Pain     Abd pain   Pain managed with prns   Assess need for and effectiveness of prn meds  Skin     N/A          Discharge Planning:  Patient unable to d/c to SNF due to insurance. Discharging home with spouse able to provide up to MOD A. Patient will discharge home with hospice on Saturday   Team Discussion: Patient with decline to total assist + 2. Requires hoyer lift for transfers.  Patient on target to meet rehab goals: no, referral to hospice. Changed to caregiver education goals and downgraded patient goals due to decline and poor participation  *See Care Plan and progress notes for long and short-term goals.   Revisions to Treatment Plan:  Palliative care consult Foley placed for urinary retention  Hospice referral  Teaching Needs: Bedside care, foley management, medication management, dietary management, skin care, etc  Current Barriers to Discharge: Decreased caregiver support and Behavior  Possible Resolutions to Barriers: Home with hospice; hospital bed, TIS w/c Recommend hoyer lift     Medical Summary Current Status: converted to hospice care cortrak removed, pt ok for D2 honey thick diet but has poor appetite  Barriers to Discharge: Medical stability   Possible Resolutions  to Barriers/Weekly Focus: dc ritalin, d/c therapy d/c home with ambulance transport in am   Continued Need for Acute Rehabilitation Level of Care: The patient requires daily medical management by a physician with specialized training in physical medicine and rehabilitation for the following reasons: Direction of a multidisciplinary physical rehabilitation program to maximize  functional independence : Yes Medical management of patient stability for increased activity during participation in an intensive rehabilitation regime.: Yes Analysis of laboratory values and/or radiology reports with any subsequent need for medication adjustment and/or medical intervention. : Yes   I attest that I was present, lead the team conference, and concur with the assessment and plan of the team.   Pamelia Hoit 01/20/2022, 2:28 PM

## 2022-01-20 NOTE — Progress Notes (Signed)
Patient ID: Ronnie Jones, male   DOB: 24-Nov-1950, 71 y.o.   MRN: [id number]  Sw reached out to patient spouse to inform her of the plan for discharge tomorrow 6/15. Spouse will be here today to receive additional training. Sw will contact hospice in reference to patient DME and delivery. SW will arrange non emergent transportation for patient discharge. Sw will follow up with patient spouse this evening.

## 2022-01-20 NOTE — Progress Notes (Signed)
Speech Language Pathology Discharge Summary  Patient Details  Name: Ronnie Jones MRN: [id number] Date of Birth: [birth year]  Today's Date: 01/20/2022 SLP Individual Time: 1300-1345 SLP Individual Time Calculation (min): 45 min  Skilled Therapeutic Interventions: Skilled ST treatment focused on follow up education with pt/spouse. Pt was alert and oriented to person/place/situation IND; required sup A verbal cues to orient to month. SLP facilitated session by providing additional pt education in effort to increase informed consent and decision making as it relates to PO consumption and swallowing upon return home under hospice care. SLP provided edu re: dysphagia, current diet recommendations, aspiration precautions, and safe swallowing considerations. Also extensively educated regarding possible ramifications associated with consuming non-prescribed diet/liquids which may increase pt's aspiration risk and lead to possible adverse health events. Pt verbalized understanding through teach back however stated he plans to eat whatever "sounds good." Spouse was unable to stay for family education therefore SLP contacted by phone. SLP reinforced all education provided to patient, as well addressed additional questions pertaining to thickened liquids and honey thick liquid purchasing information. Spouse verbalized understanding with 100% accuracy through teach back. SLP placed handouts to reinforce education in pt's stroke education binder in room and notified pt/spouse where handouts were placed. SLP also provided pt/spouse with honey thick liquid packets for home. All education complete. Patient was left in bed with alarm activated and immediate needs within reach at end of session.   Patient has met 5 of 5 long term goals.  Patient to discharge at overall Supervision;Min level.  Reasons goals not met: all goals met   Clinical Impression/Discharge Summary: Patient has made slow gains and has met 5 of 5  long-term goals this admission. Progress has been limited by fatigue and motivation/interest impacting functional participation during both structured and unstructured dysphagia, speech, and cognitive treatments. Pt is completing cognitive tasks ranging from sup-to-mod assist depending on the task and pt motivation. Pt met orientation and intellectual awareness goals at supervision A level. Other cognitive goals were discontinued due to decreased participation/interest. Pt is currently consuming a dysphagia 2 diet and honey thick liquids with min A verbal cues for implementation of safe swallowing strategies. Oral intake continues to be severely limited due to poor appetite (per pt) and receptiveness. Pt requires max A verbal encouragement for participation in swallowing therapy and PO trials, most consistently denying consideration to participate. Cortrak for supplemental nutrition was recently removed per pt/family wishes. Pt demonstrates improved speech intelligibility and speaking at the sentence level with min A for implementation of strategies to achieve >90% intelligibility. Patient and family education is complete and patient to discharge at overall supervision-to-min A level. Care partner is independent to provide the necessary physical and cognitive assistance at discharge with support from hospice care. Follow-up therapy is not indicated at this time d/t pt/family decision to discharge home with hospice.   Care Partner:  Caregiver Able to Provide Assistance: Yes  Type of Caregiver Assistance: Cognitive;Physical  Recommendation:  24 hour supervision/assistance;Other (comment)  Rationale for SLP Follow Up: Other (comment) (pt discharing to hospice level of care)   Equipment: Provided pt/family with thickener packets   Reasons for discharge: Discharged from hospital   Patient/Family Agrees with Progress Made and Goals Achieved: Yes    Khaden Gater T Chue Berkovich 01/20/2022, 3:57 PM

## 2022-01-20 NOTE — Progress Notes (Addendum)
PROGRESS NOTE   Subjective/Complaints:   Pt in bed saying "I'm going home", Cortrak out, pt states he did not eat breakfast   ROS: Limited due to cognitive/behavioral   Objective:   No results found.  No results for input(s): "WBC", "HGB", "HCT", "PLT" in the last 72 hours.  Recent Labs    01/18/22 0505  NA 137  K 4.0  CL 101  CO2 24  GLUCOSE 199*  BUN 62*  CREATININE 1.36*  CALCIUM 9.4      Intake/Output Summary (Last 24 hours) at 01/20/2022 0820 Last data filed at 01/20/2022 0314 Gross per 24 hour  Intake --  Output 800 ml  Net -800 ml         Physical Exam: Vital Signs Blood pressure (!) 141/69, pulse 88, temperature 97.9 F (36.6 C), resp. rate 16, height 5\' 7"  (1.702 m), weight 69.2 kg, SpO2 92 %.   General: No acute distress Mood and affect are appropriate Heart: Regular rate and rhythm no rubs murmurs or extra sounds Lungs: Clear to auscultation, breathing unlabored, no rales or wheezes Abdomen: Positive bowel sounds, soft nontender to palpation, nondistended Extremities: No clubbing, cyanosis, or edema Skin: No evidence of breakdown, no evidence of rash  Neuro: Cranial nerves II through XII intact, alert and oriented x3 with ongoing dysarthria, follows simple commands with extra time motor strength is 5 out of 5 in left and 0 of 5 right deltoid, bicep, tricep, trace grip, hip flexor, knee extensors, ankle dorsiflexor and plantar flexor Some trace RIght hip add and shoulder add--no real changes today  Tone MAS 1-2 at Right elbow flexors--stable motor ex  Assessment/Plan: 1. Functional deficits which require 3+ hours per day of interdisciplinary therapy in a comprehensive inpatient rehab setting. Physiatrist is providing close team supervision and 24 hour management of active medical problems listed below. Physiatrist and rehab team continue to assess barriers to discharge/monitor patient  progress toward functional and medical goals  Care Tool:  Bathing  Bathing activity did not occur: Refused Body parts bathed by patient: Face, Left upper leg, Abdomen, Chest, Right arm   Body parts bathed by helper: Left arm, Front perineal area, Buttocks, Left upper leg, Right lower leg, Left lower leg     Bathing assist Assist Level: Moderate Assistance - Patient 50 - 74%     Upper Body Dressing/Undressing Upper body dressing   What is the patient wearing?: Pull over shirt    Upper body assist Assist Level: Maximal Assistance - Patient 25 - 49%    Lower Body Dressing/Undressing Lower body dressing      What is the patient wearing?: Pants, Incontinence brief     Lower body assist Assist for lower body dressing: Total Assistance - Patient < 25%     Toileting Toileting    Toileting assist Assist for toileting: Total Assistance - Patient < 25%     Transfers Chair/bed transfer  Transfers assist     Chair/bed transfer assist level: Maximal Assistance - Patient 25 - 49% (squat pivot)     Locomotion Ambulation   Ambulation assist   Ambulation activity did not occur: Safety/medical concerns  Assist level: 2 helpers Assistive device:  Other (comment) (L hallway rail) Max distance: 72ft   Walk 10 feet activity   Assist  Walk 10 feet activity did not occur: Safety/medical concerns        Walk 50 feet activity   Assist Walk 50 feet with 2 turns activity did not occur: Safety/medical concerns         Walk 150 feet activity   Assist Walk 150 feet activity did not occur: Safety/medical concerns         Walk 10 feet on uneven surface  activity   Assist Walk 10 feet on uneven surfaces activity did not occur: Safety/medical concerns         Wheelchair     Assist Is the patient using a wheelchair?: Yes Type of Wheelchair: Manual    Wheelchair assist level: Dependent - Patient 0% Max wheelchair distance: 150    Wheelchair 50 feet  with 2 turns activity    Assist        Assist Level: Dependent - Patient 0%   Wheelchair 150 feet activity     Assist      Assist Level: Dependent - Patient 0%   Blood pressure (!) 141/69, pulse 88, temperature 97.9 F (36.6 C), resp. rate 16, height 5\' 7"  (1.702 m), weight 69.2 kg, SpO2 92 %.   Medical Problem List and Plan: 1. 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  Antithrombotics: -DVT/anticoagulation:  Pharmaceutical: d/c anticoaguluation hx ICH, DVT resolved              5/27- R brachial DVT- resolved on vascular imaging 01/11/22 3. Pain Management: Tylenol as needed 4. Mood: LCSW to evaluate and provide emotional support             -antipsychotic agents: n/a 5. Neuropsych: This patient is not capable of making decisions on his own behalf. 6. Skin/Wound Care: Routine skin care checks 7. Fluids/Electrolytes/Nutrition: Routine Is and Os and follow-up chemistries             --dysphagia 1; honey thick- Has Cortrak in place and receiving TF's.  6/11 tolerating, cont TF while pt at rehab 8: T2DM: Hgb A1c- metformin on hold due to AKI/variable intake (puree diet). --Monitor BS ac/hs and use SSI for elevated BS. --Changed TF to 6 pm-6 am CBG (last 3)  Recent Labs    01/19/22 1710 01/19/22 2054 01/20/22 0616  GLUCAP 150* 147* 165*   CBGs inb range 6/14 9: Hypertension: Off Lisinopril due to AKI/hyperkalemia  --continue amlodipine, atenolol, hydralazine Vitals:   01/19/22 2033 01/20/22 0257  BP:  (!) 141/69  Pulse:  88  Resp:  16  Temp:  97.9 F (36.6 C)  SpO2: 92% 92%   6/13 on cardura 1mg  qhs, amlodipine  10mg   and   hydralazine   50 mg TID-  -good control on current meds 10:  COPD: Continue Anoro Ellipta daily.  Flutter valve qid. Smoked 2ppd - CXR OK  --now off prednisone, no  wheezing . --Duoneb prn   11: Alcohol abuse: cessation counseling. Off phenobarb. Check Thiamine level.  --continue B12, thiamine and folic acid 12: Tobacco abuse: cessation counseling --continue nicotine patch 13: Hyperlipidemia: LDL- 192 and Trig- 499-->continue Crestor 40 mg 14: AKI may bhave CKD : Baseline SCr-?~1.36 --Encourage --drinking honey liquids but not touching puree's.              --  continue water flushes qid via cortak -Creat bumped up to 1.97- cont H2O flushes 200ml q 4 Na+ 134, hesitate increasing this , alb a little low but doubt significant 3rd spacing -Cr fluctuating 1.4-1.5 range     Latest Ref Rng & Units 01/18/2022    5:05 AM 01/12/2022    5:17 AM 01/11/2022    6:06 AM  BMP  Glucose 70 - 99 mg/dL 161199  096147  045172   BUN 8 - 23 mg/dL 62  43  34   Creatinine 0.61 - 1.24 mg/dL 4.091.36  8.111.47  9.141.40   Sodium 135 - 145 mmol/L 137  129  129   Potassium 3.5 - 5.1 mmol/L 4.0  4.6  5.5   Chloride 98 - 111 mmol/L 101  98  97   CO2 22 - 32 mmol/L 24  20  23    Calcium 8.9 - 10.3 mg/dL 9.4  8.7  8.8    BMET with elevated BUN increase free H2O- recheck in am  15. Vitamin C deficiency: On daily supplement 16. Vitamin D deficiency: Continue weekly supplement.   17. Spasticity- overall improving  On tizanidine 2mg  TID - may be contributing to tiredness - this was d/ced but pt remains somnolent most likely CVA related  18. RUE swelling/weeping- resolved, RIght brachial DVT seen on vascular US 5/26,improving  19.  Abd distension -  , CT abd pelvis showed retroperitoneal bleed in right psoas muscle no sig drop in hgb   20.  Urinary retention foley 21.  Insomnia remeron LOS: 20 days A FACE TO FACE EVALUATION WAS PERFORMED  Erick Colacendrew E Analleli Gierke 01/20/2022, 8:20 AM

## 2022-01-21 DIAGNOSIS — G4701 Insomnia due to medical condition: Secondary | ICD-10-CM

## 2022-01-21 DIAGNOSIS — R339 Retention of urine, unspecified: Secondary | ICD-10-CM

## 2022-01-21 DIAGNOSIS — F0631 Mood disorder due to known physiological condition with depressive features: Secondary | ICD-10-CM

## 2022-01-21 DIAGNOSIS — I82629 Acute embolism and thrombosis of deep veins of unspecified upper extremity: Secondary | ICD-10-CM

## 2022-01-21 DIAGNOSIS — Z515 Encounter for palliative care: Secondary | ICD-10-CM

## 2022-01-21 LAB — GLUCOSE, CAPILLARY: Glucose-Capillary: 156 mg/dL — ABNORMAL HIGH (ref 70–99)

## 2022-01-21 MED ORDER — AMLODIPINE BESYLATE 10 MG PO TABS: 10.0000 mg | ORAL_TABLET | Freq: Every day | ORAL | 0 refills | Status: AC

## 2022-01-21 MED ORDER — AMLODIPINE BESYLATE 10 MG PO TABS: 10.0000 mg | ORAL_TABLET | Freq: Every day | ORAL | 0 refills | Status: DC

## 2022-01-21 MED ORDER — INSULIN GLARGINE 100 UNIT/ML SOLOSTAR PEN: 5.0000 [IU] | PEN_INJECTOR | Freq: Every day | SUBCUTANEOUS | 11 refills | Status: AC

## 2022-01-21 MED ORDER — DOXAZOSIN MESYLATE 1 MG PO TABS: 1.0000 mg | ORAL_TABLET | Freq: Every day | ORAL | 0 refills | Status: DC

## 2022-01-21 MED ORDER — INSULIN GLARGINE 100 UNIT/ML SOLOSTAR PEN: 5.0000 [IU] | PEN_INJECTOR | Freq: Every day | SUBCUTANEOUS | 11 refills | Status: DC

## 2022-01-21 MED ORDER — PANTOPRAZOLE SODIUM 40 MG PO PACK: 40.0000 mg | PACK | Freq: Every day | ORAL | 0 refills | Status: DC

## 2022-01-21 MED ORDER — HYDRALAZINE HCL 50 MG PO TABS: 50.0000 mg | ORAL_TABLET | Freq: Three times a day (TID) | ORAL | 0 refills | Status: DC

## 2022-01-21 MED ORDER — ATENOLOL 50 MG PO TABS: 50.0000 mg | ORAL_TABLET | Freq: Every day | ORAL | 0 refills | Status: AC

## 2022-01-21 MED ORDER — POLYETHYLENE GLYCOL 3350 17 G PO PACK: 17.0000 g | PACK | Freq: Every day | ORAL | 0 refills | Status: AC

## 2022-01-21 MED ORDER — PEN NEEDLES 32G X 5 MM MISC: 1.0000 "application " | Freq: Every day | 0 refills | Status: DC

## 2022-01-21 MED ORDER — ATENOLOL 50 MG PO TABS: 50.0000 mg | ORAL_TABLET | Freq: Every day | ORAL | 0 refills | Status: DC

## 2022-01-21 MED ORDER — SENNOSIDES-DOCUSATE SODIUM 8.6-50 MG PO TABS: 1.0000 | ORAL_TABLET | Freq: Two times a day (BID) | ORAL | 0 refills | Status: AC

## 2022-01-21 MED ORDER — PEN NEEDLES 32G X 5 MM MISC: 1.0000 "application " | Freq: Every day | 0 refills | Status: AC

## 2022-01-21 NOTE — Progress Notes (Signed)
PROGRESS NOTE   Subjective/Complaints:   Pt aware of d/c  Cortrak out, p  ROS: Limited due to cognitive/behavioral   Objective:   No results found.  No results for input(s): "WBC", "HGB", "HCT", "PLT" in the last 72 hours.  No results for input(s): "NA", "K", "CL", "CO2", "GLUCOSE", "BUN", "CREATININE", "CALCIUM" in the last 72 hours.    Intake/Output Summary (Last 24 hours) at 01/21/2022 0742 Last data filed at 01/21/2022 0500 Gross per 24 hour  Intake --  Output 1000 ml  Net -1000 ml         Physical Exam: Vital Signs Blood pressure 140/75, pulse 81, temperature 97.7 F (36.5 C), resp. rate 19, height 5\' 7"  (1.702 m), weight 68.1 kg, SpO2 94 %.   General: No acute distress Mood and affect are appropriate Heart: Regular rate and rhythm no rubs murmurs or extra sounds Lungs: Clear to auscultation, breathing unlabored, no rales or wheezes Abdomen: Positive bowel sounds, soft nontender to palpation, nondistended Extremities: No clubbing, cyanosis, or edema Skin: No evidence of breakdown, no evidence of rash  Neuro: Cranial nerves II through XII intact, alert and oriented x3 with ongoing dysarthria, follows simple commands with extra time motor strength is 5 out of 5 in left and 0 of 5 right deltoid, bicep, tricep, trace grip, hip flexor, knee extensors, ankle dorsiflexor and plantar flexor Some trace RIght hip add and shoulder add--no real changes today  Tone MAS 2-3 at Right elbow flexors--stable motor ex  Assessment/Plan: 1. Functional deficits  Due to L MCA bleed Stable for D/C today F/u Hospice MD   Care Tool:  Bathing  Bathing activity did not occur: Refused Body parts bathed by patient: Face   Body parts bathed by helper: Right arm, Left arm, Chest, Abdomen, Front perineal area, Buttocks, Right upper leg, Left upper leg, Right lower leg, Left lower leg     Bathing assist Assist Level: Total  Assistance - Patient < 25%     Upper Body Dressing/Undressing Upper body dressing   What is the patient wearing?: Pull over shirt    Upper body assist Assist Level: Total Assistance - Patient < 25%    Lower Body Dressing/Undressing Lower body dressing      What is the patient wearing?: Pants, Incontinence brief     Lower body assist Assist for lower body dressing: Total Assistance - Patient < 25%     Toileting Toileting    Toileting assist Assist for toileting: Total Assistance - Patient < 25%     Transfers Chair/bed transfer  Transfers assist  Chair/bed transfer activity did not occur: Safety/medical concerns  Chair/bed transfer assist level: Maximal Assistance - Patient 25 - 49%     Locomotion Ambulation   Ambulation assist   Ambulation activity did not occur: Refused  Assist level: 2 helpers Assistive device: Other (comment) (L hallway rail) Max distance: 8ft   Walk 10 feet activity   Assist  Walk 10 feet activity did not occur: Refused        Walk 50 feet activity   Assist Walk 50 feet with 2 turns activity did not occur: Refused  Walk 150 feet activity   Assist Walk 150 feet activity did not occur: Refused         Walk 10 feet on uneven surface  activity   Assist Walk 10 feet on uneven surfaces activity did not occur: Refused         Wheelchair     Assist Is the patient using a wheelchair?: Yes Type of Wheelchair: Manual Wheelchair activity did not occur: Refused  Wheelchair assist level: Dependent - Patient 0% Max wheelchair distance: 150    Wheelchair 50 feet with 2 turns activity    Assist    Wheelchair 50 feet with 2 turns activity did not occur: Refused   Assist Level: Dependent - Patient 0%   Wheelchair 150 feet activity     Assist  Wheelchair 150 feet activity did not occur: Refused   Assist Level: Dependent - Patient 0%   Blood pressure 140/75, pulse 81, temperature 97.7 F  (36.5 C), resp. rate 19, height 5\' 7"  (1.702 m), weight 68.1 kg, SpO2 94 %.   Medical Problem List and Plan: 1. 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  Antithrombotics: -DVT/anticoagulation:  Pharmaceutical: d/c anticoaguluation hx ICH, DVT resolved              5/27- R brachial DVT- resolved on vascular imaging 01/11/22 3. Pain Management: Tylenol as needed 4. Mood: LCSW to evaluate and provide emotional support             -antipsychotic agents: n/a 5. Neuropsych: This patient is not capable of making decisions on his own behalf. 6. Skin/Wound Care: Routine skin care checks 7. Fluids/Electrolytes/Nutrition: Routine Is and Os and follow-up chemistries             --dysphagia 1; honey thick- Has Cortrak in place and receiving TF's.  6/11 tolerating, cont TF while pt at rehab 8: T2DM: Hgb A1c- metformin on hold due to AKI/variable intake (puree diet). --Monitor BS ac/hs and use SSI for elevated BS. --Changed TF to 6 pm-6 am CBG (last 3)  Recent Labs    01/20/22 1619 01/20/22 2043 01/21/22 0555  GLUCAP 212* 126* 156*   CBGs inb range 6/15 9: Hypertension: Off Lisinopril due to AKI/hyperkalemia  --continue amlodipine, atenolol, hydralazine Vitals:   01/20/22 1948 01/21/22 0347  BP: 116/68 140/75  Pulse: 79 81  Resp: 16 19  Temp: 97.6 F (36.4 C) 97.7 F (36.5 C)  SpO2: 95% 94%   6/13 on cardura 1mg  qhs, amlodipine  10mg   and   hydralazine   50 mg TID-  -good control on current meds 10:  COPD: Continue Anoro Ellipta daily.  Flutter valve qid. Smoked 2ppd - CXR OK  --now off prednisone, no wheezing . --Duoneb prn   11: Alcohol abuse: cessation counseling. Off phenobarb. Check Thiamine level.  --continue B12, thiamine and folic acid 12: Tobacco abuse: cessation counseling --continue nicotine  patch 13: Hyperlipidemia: LDL- 192 and Trig- 499-->continue Crestor 40 mg 14: AKI may bhave CKD : Baseline SCr-?~1.36 --Encourage --drinking honey liquids but not touching puree's.              --continue water flushes qid via cortak -Creat bumped up to 1.97- cont H2O flushes 7/13 q 4 Na+ 134, hesitate increasing this , alb a little low but doubt significant 3rd spacing -Cr fluctuating 1.4-1.5 range  Latest Ref Rng & Units 01/18/2022    5:05 AM 01/12/2022    5:17 AM 01/11/2022    6:06 AM  BMP  Glucose 70 - 99 mg/dL 742  595  638   BUN 8 - 23 mg/dL 62  43  34   Creatinine 0.61 - 1.24 mg/dL 7.56  4.33  2.95   Sodium 135 - 145 mmol/L 137  129  129   Potassium 3.5 - 5.1 mmol/L 4.0  4.6  5.5   Chloride 98 - 111 mmol/L 101  98  97   CO2 22 - 32 mmol/L 24  20  23    Calcium 8.9 - 10.3 mg/dL 9.4  8.7  8.8    BMET with elevated BUN increase free H2O- recheck in am  15. Vitamin C deficiency: On daily supplement 16. Vitamin D deficiency: Continue weekly supplement.   17. Spasticity- overall improving  On tizanidine 2mg  TID - may be contributing to tiredness - this was d/ced but pt remains somnolent most likely CVA related  18. RUE swelling/weeping- resolved, RIght brachial DVT seen on vascular 5/26,improving  19.  Abd distension -  , CT abd pelvis showed retroperitoneal bleed in right psoas muscle no sig drop in hgb   20.  Urinary retention foley 21.  Insomnia remeron LOS: 21 days A FACE TO FACE EVALUATION WAS PERFORMED  01/21/2022, 7:42 AM

## 2022-01-21 NOTE — Discharge Summary (Signed)
Physician Discharge Summary  Patient ID: Ronnie Jones MRN: [id number] DOB/AGE: 1951/04/12 71 y.o.  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 IMPRESSION: Feeding tube with tip in the distal stomach. Electronically Signed   By: Genevive Bi M.D.   On: 01/15/2022 19:23   CT HEAD WO CONTRAST ( )  Result Date: 01/12/2022 CLINICAL DATA:  Intracranial hemorrhage, follow-up EXAM: CT HEAD WITHOUT CONTRAST TECHNIQUE: Contiguous axial images were obtained from the base of the skull through the vertex without intravenous contrast. RADIATION DOSE REDUCTION: This exam was performed according to the departmental dose-optimization program which includes automated exposure control, adjustment of the mA and/or kV according to patient size and/or use of iterative reconstruction technique. COMPARISON:  01/01/2022 FINDINGS: Brain: Significantly decreased hyperdense hemorrhage centered within the left thalamus and adjacent white matter. Residual edema and mild mass effect. Decreased layering intraventricular hemorrhage. No new hemorrhage. No new loss of gray-white differentiation. Stable findings of probable chronic microvascular ischemic changes. Prominence of the ventricles and sulci reflects stable  parenchymal volume loss. Vascular: No new finding. Skull: Calvarium is unremarkable. Sinuses/Orbits: No acute finding. Other: None. IMPRESSION: Expected evolution of left thalamic hemorrhage.  No new findings. Electronically Signed   By: Guadlupe Spanish M.D.   On: 01/12/2022 16:15   VAS [place] UPPER EXTREMITY VENOUS DUPLEX  Result Date: 01/11/2022 UPPER VENOUS STUDY  Patient Name:  [facility]  Date of Exam:   01/11/2022 Medical Rec #: [id number]       Accession #:    [id number] Date of Birth: [birth year]      Patient Gender: M Patient Age:   21 years Exam Location:  [facility] Procedure:      VAS [place] UPPER EXTREMITY VENOUS DUPLEX Referring Phys: Claudette Laws --------------------------------------------------------------------------------  Indications: Follow up right brachial DVT Limitations: Patient positioning/contracted and in pain. Comparison Study: 01-01-2022 Right upper extremity venous was positive for                   brachial DVT and basilic SVT. Performing Technologist: Jean Rosenthal RDMS, RVT  Examination Guidelines: A complete evaluation includes B-mode imaging, spectral Doppler, color Doppler, and power Doppler as needed of all accessible portions of each vessel. Bilateral testing is considered an integral part of a complete examination. Limited examinations for reoccurring indications may be performed as noted.  Right Findings: +----------+------------+---------+-----------+----------+-------+ RIGHT     CompressiblePhasicitySpontaneousPropertiesSummary +----------+------------+---------+-----------+----------+-------+ IJV           Full       Yes       Yes                      +----------+------------+---------+-----------+----------+-------+ Subclavian               Yes  Yes                      +----------+------------+---------+-----------+----------+-------+ Axillary      Full       Yes       Yes                       +----------+------------+---------+-----------+----------+-------+ Brachial      Full       Yes       Yes                      +----------+------------+---------+-----------+----------+-------+ Radial        Full                                          +----------+------------+---------+-----------+----------+-------+ Ulnar         Full                                          +----------+------------+---------+-----------+----------+-------+ Cephalic      Full                                          +----------+------------+---------+-----------+----------+-------+ Basilic       Full                                          +----------+------------+---------+-----------+----------+-------+  Left Findings: +----+------------+---------+-----------+----------+-------+ LEFTCompressiblePhasicitySpontaneousPropertiesSummary +----+------------+---------+-----------+----------+-------+ IJV     Full       Yes       Yes                      +----+------------+---------+-----------+----------+-------+  Summary:  Right: No evidence of deep vein thrombosis in the upper extremity. No evidence of superficial vein thrombosis in the upper extremity. Previously visualized right brachial and basilic DVT appears improved on today's examination.  Left: No evidence of thrombosis in the subclavian.  *See table(s) above for measurements and observations.  Diagnosing physician: Gerarda Fraction Electronically signed by Gerarda Fraction on 01/11/2022 at 7:05:01 PM.    Final    DG Chest 2 View  Result Date: 01/11/2022 CLINICAL DATA:  Shortness of breath. EXAM: CHEST - 2 VIEW COMPARISON:  AP chest 01/09/2022, 01/08/2022, 01/07/2022; chest two views 12/31/2021; CT chest 05/29/2018; CT abdomen and pelvis 01/09/2022 FINDINGS: Cardiac silhouette is again mildly enlarged. Moderate calcifications are again seen within the aortic arch. Enteric tube descends below the diaphragm with the tip excluded by  collimation. Improved aeration of the left lung base compared to most recent 01/09/2022 frontal radiograph. Minimal blunting of the posterior costophrenic angle on lateral view likely relates to the small left-greater-than-right pleural effusions previously seen on 01/09/2022 CT abdomen and pelvis. No significant airspace consolidation is seen. No pneumothorax. Mild-to-moderate multilevel degenerative disc changes of the thoracic spine. IMPRESSION: 1. Improved aeration of the lung bases compared to 01/09/2022 frontal radiograph. 2. Trace pleural effusions on lateral view. No significant lung consolidation. Electronically Signed   By: Neita Garnet M.D.   On: 01/11/2022 13:49   DG  CHEST PORT 1 VIEW  Result Date: 01/09/2022 CLINICAL DATA:  [id number]. Reason for exam: dyspnea Principle problem: intraparenchymal hemorrhage of brain EXAM: PORTABLE CHEST 1 VIEW.  Patient is rotated. COMPARISON:  Chest x-ray 01/08/2022, CT chest 05/29/2018 FINDINGS: Enteric tube coursing below the hemidiaphragm with tip collimated off view. The heart and mediastinal contours are unchanged. Aortic calcification. Bilateral costophrenic angles collimated off view. No focal consolidation. No pulmonary edema. Known bilateral trace pleural effusions not well visualized. No pneumothorax. No acute osseous abnormality. IMPRESSION: 1. Please note limited evaluation due to bilateral costophrenic angles collimated off view. Known bilateral trace pleural effusions not well visualized. Please see separately dictated CT abdomen pelvis 01/09/2022. 2. Otherwise no other acute cardiopulmonary abnormality. 3.  Aortic Atherosclerosis (ICD10-I70.0). Electronically Signed   By: Tish Frederickson M.D.   On: 01/09/2022 19:16   CT ABDOMEN PELVIS WO CONTRAST  Result Date: 01/09/2022 CLINICAL DATA:  71 year old male with LEFT abdominal and pelvic pain. EXAM: CT ABDOMEN AND PELVIS WITHOUT CONTRAST TECHNIQUE: Multidetector CT imaging of the abdomen and pelvis was  performed following the standard protocol without IV contrast. RADIATION DOSE REDUCTION: This exam was performed according to the departmental dose-optimization program which includes automated exposure control, adjustment of the mA and/or kV according to patient size and/or use of iterative reconstruction technique. COMPARISON:  01/08/2022 radiographs.  10/21/2017 PET CT FINDINGS: Please note that parenchymal and vascular abnormalities may be missed as intravenous contrast was not administered. Lower chest: Small bilateral pleural effusions, LEFT greater than RIGHT, noted with mild bibasilar atelectasis. Heavy coronary artery atherosclerotic calcifications are identified. Hepatobiliary: The liver and gallbladder are unremarkable. There is no evidence of intrahepatic or extrahepatic biliary dilatation. Pancreas: Unremarkable Spleen: Unremarkable Adrenals/Urinary Tract: The kidneys, adrenal glands and bladder are unremarkable except for mild RIGHT renal artery calcifications. Stomach/Bowel: A small bore feeding tube is present with the tip in the distal stomach. There is no evidence of bowel obstruction, definite bowel wall thickening or inflammatory changes. Colonic diverticulosis identified without evidence of acute diverticulitis. Vascular/Lymphatic: Aortic atherosclerosis. No enlarged abdominal or pelvic lymph nodes. Reproductive: Prostate is unremarkable. Other: No ascites, focal collection or pneumoperitoneum. Musculoskeletal: There is mild to moderate enlargement and increased density of the RIGHT psoas muscle compatible with hemorrhage/hematoma. No acute or suspicious bony abnormalities are noted. IMPRESSION: 1. Mild to moderate enlargement and increased density of the RIGHT psoas muscle compatible with hemorrhage/hematoma. Infection/injury would be considered much less likely. Correlate clinically. 2. Small bilateral pleural effusions, LEFT greater than RIGHT, with mild bibasilar atelectasis. 3. Coronary  artery disease. 4. Aortic Atherosclerosis (ICD10-I70.0). Electronically Signed   By: Harmon Pier M.D.   On: 01/09/2022 15:13   DG Abd Portable 1V  Result Date: 01/08/2022 CLINICAL DATA:  Abdominal pain. EXAM: PORTABLE ABDOMEN - 1 VIEW COMPARISON:  Jan 06, 2022. FINDINGS: The bowel gas pattern is normal. Distal tip of feeding tube is seen in expected position of distal stomach. No radio-opaque calculi or other significant radiographic abnormality are seen. IMPRESSION: No abnormal bowel dilatation is noted. Electronically Signed   By: Lupita Raider M.D.   On: 01/08/2022 10:28   DG Chest 1 View  Result Date: 01/08/2022 CLINICAL DATA:  Shortness of breath. Evaluate left basilar consolidation, pleural effusion, pneumothorax. EXAM: CHEST  1 VIEW COMPARISON:  Portable chest yesterday at 5:34 p.m. FINDINGS: 4:54 a.m., 01/08/2022. There is mild cardiomegaly without evidence of CHF. Patchy hazy opacity in the left lung base is seen with improvement, with small left pleural effusion also improved  versus redistributed. The remaining lungs are clear. The mediastinal configuration is stable with aortic atherosclerosis. There is no visible pneumothorax.  No acute osseous abnormality. Feeding tube enters the stomach with the intragastric course of the tube not filmed. IMPRESSION: 1. Improved opacities in the left base with left pleural effusion either smaller in size or redistributed today. 2. Vascular congestion is no longer seen. 3. No visible pneumothorax. 4. Aortic atherosclerosis. Electronically Signed   By: Almira Bar M.D.   On: 01/08/2022 06:27   DG Chest Port 1 View  Result Date: 01/07/2022 CLINICAL DATA:  Acute left upper quadrant pain EXAM: PORTABLE CHEST 1 VIEW COMPARISON:  12/31/2021 FINDINGS: Single frontal view of the chest demonstrates enteric catheter passing below diaphragm tip excluded by collimation. Interval development of left basilar consolidation and small left pleural effusion. Trace right  pleural effusion also suspected. Increased central vascular congestion. No pneumothorax. No acute bony abnormalities. IMPRESSION: 1. Increased central vascular congestion with developing left basilar consolidation and bilateral effusions, most consistent with volume overload. Superimposed infection cannot be excluded. Electronically Signed   By: Sharlet Salina M.D.   On: 01/07/2022 17:59   DG Abd 1 View  Result Date: 01/06/2022 CLINICAL DATA:  Abdominal distension. EXAM: ABDOMEN - 1 VIEW COMPARISON:  Radiographs 01/04/2022 and 01/01/2022. FINDINGS: 1029 hours. Feeding tube appears unchanged, in the right upper quadrant of the abdomen. The visualized bowel gas pattern is normal. There is retained barium within left-sided colonic diverticula. No supine evidence of free intraperitoneal air. Vascular calcifications and pelvic phleboliths are noted. There are mild degenerative changes in the spine. IMPRESSION: No evidence of acute abdominal process. Unchanged position of feeding tube. Electronically Signed   By: Carey Bullocks M.D.   On: 01/06/2022 10:44   DG Abd 1 View  Result Date: 01/04/2022 CLINICAL DATA:  Abdominal distension EXAM: ABDOMEN - 1 VIEW COMPARISON:  Jan 01, 2022 FINDINGS: A feeding tube terminates in the right side of the abdomen, probably in the proximal duodenum. Air-filled loops of large and small bowel are normal in caliber. No free air, portal venous gas, or pneumatosis. IMPRESSION: Air-filled loops of large and small bowel are normal in caliber. No evidence of obstruction or ileus. The distal tip of the feeding tube is in the second portion of the duodenum. Electronically Signed   By: Gerome Sam III M.D.   On: 01/04/2022 11:47   CT HEAD WO CONTRAST ( )  Result Date: 01/01/2022 CLINICAL DATA:  Follow-up hemorrhage EXAM: CT HEAD WITHOUT CONTRAST TECHNIQUE: Contiguous axial images were obtained from the base of the skull through the vertex without intravenous contrast. RADIATION  DOSE REDUCTION: This exam was performed according to the departmental dose-optimization program which includes automated exposure control, adjustment of the mA and/or kV according to patient size and/or use of iterative reconstruction technique. COMPARISON:  CT 12/23/2021, 12/21/2021, MRI 12/27/2021 FINDINGS: Brain: Redemonstrated intraparenchymal hemorrhage centered within left thalamus, this has slightly decreased in size compared with the previous CTs. There is interval increase in surrounding hypodense edema. There is decreased bilateral intraventricular hemorrhage compared to the previous CT examinations. Local mass effect with septum pellucidum shifted 3 mm to the right as before. There is slight interval increase in lateral ventricular dilatation. Third and fourth ventricles are stable in size. Atrophy and chronic small vessel ischemic changes of the white matter. No new hemorrhagic focus. Vascular: No hyperdense vessels. Carotid vascular and vertebral calcification Skull: Normal. Negative for fracture or focal lesion. Sinuses/Orbits: No acute finding. Other: None IMPRESSION:  1. Slight interval decrease in size of the intraparenchymal hemorrhage centered within the left thalamus, hemorrhage is becoming less dense compared to the prior CTs. There is also slight interval decrease in bilateral intraventricular hemorrhage. Increased edema surrounding the hemorrhage since initial CT from May 15, probably no great change compared with the interval MRI on the twenty-first. Local mass effect on the left lateral ventricle with about 3 mm shift of the septum to the right as before. The lateral ventricles are slightly enlarged compared to prior but the third and fourth ventricles are stable in size. 2. Atrophy and chronic small vessel ischemic changes of the white matter. Electronically Signed   By: Jasmine Pang M.D.   On: 01/01/2022 21:09   VAS [place] UPPER EXTREMITY VENOUS DUPLEX  Result Date: 01/01/2022 UPPER VENOUS  STUDY  Patient Name:  [facility]  Date of Exam:   01/01/2022 Medical Rec #: [id number]       Accession #:    [id number] Date of Birth: 05-Mar-1951      Patient Gender: M Patient Age:   89 years Exam Location:  [facility] Procedure:      VAS [place] UPPER EXTREMITY VENOUS DUPLEX Referring Phys: Italo Banton --------------------------------------------------------------------------------  Indications: Edema Limitations: Poor ultrasound/tissue interface and position, contracture. Comparison Study: No prior study Performing Technologist: Gertie Fey MHA, RDMS, RVT, RDCS  Examination Guidelines: A complete evaluation includes B-mode imaging, spectral Doppler, color Doppler, and power Doppler as needed of all accessible portions of each vessel. Bilateral testing is considered an integral part of a complete examination. Limited examinations for reoccurring indications may be performed as noted.  Right Findings: +----------+------------+---------+-----------+----------+-------+ RIGHT     CompressiblePhasicitySpontaneousPropertiesSummary +----------+------------+---------+-----------+----------+-------+ IJV           Full       Yes       Yes                      +----------+------------+---------+-----------+----------+-------+ Subclavian    Full       Yes       Yes                      +----------+------------+---------+-----------+----------+-------+ Axillary      Full       Yes       Yes                      +----------+------------+---------+-----------+----------+-------+ Brachial      None                 No                Acute  +----------+------------+---------+-----------+----------+-------+ Radial        Full                                          +----------+------------+---------+-----------+----------+-------+ Ulnar         Full                                          +----------+------------+---------+-----------+----------+-------+ Cephalic       Full                                          +----------+------------+---------+-----------+----------+-------+  Basilic       None                                   Acute  +----------+------------+---------+-----------+----------+-------+  Left Findings: +----------+------------+---------+-----------+----------+--------------+ LEFT      CompressiblePhasicitySpontaneousProperties   Summary     +----------+------------+---------+-----------+----------+--------------+ Subclavian                                          Not visualized +----------+------------+---------+-----------+----------+--------------+  Summary:  Right: Findings consistent with acute deep vein thrombosis involving the right brachial veins. Findings consistent with acute superficial vein thrombosis involving the right basilic vein.  *See table(s) above for measurements and observations.  Diagnosing physician: Lemar Livings MD Electronically signed by Lemar Livings MD on 01/01/2022 at 7:02:12 PM.    Final    VAS [place] LOWER EXTREMITY VENOUS (DVT)  Result Date: 01/01/2022  Lower Venous DVT Study Patient Name:  [facility]  Date of Exam:   01/01/2022 Medical Rec #: [id number]       Accession #:    [id number] Date of Birth: 11/06/1950      Patient Gender: M Patient Age:   73 years Exam Location:  [facility] Procedure:      VAS [place] LOWER EXTREMITY VENOUS (DVT) Referring Phys: Rolla Kedzierski --------------------------------------------------------------------------------  Indications: Edema, and immobility.  Comparison Study: No prior study Performing Technologist: Gertie Fey MHA, RDMS, RVT, RDCS  Examination Guidelines: A complete evaluation includes B-mode imaging, spectral Doppler, color Doppler, and power Doppler as needed of all accessible portions of each vessel. Bilateral testing is considered an integral part of a complete examination. Limited examinations for reoccurring indications may be performed as  noted. The reflux portion of the exam is performed with the patient in reverse Trendelenburg.  +---------+---------------+---------+-----------+----------+--------------+ RIGHT    CompressibilityPhasicitySpontaneityPropertiesThrombus Aging +---------+---------------+---------+-----------+----------+--------------+ CFV      Full           Yes      Yes                                 +---------+---------------+---------+-----------+----------+--------------+ SFJ      Full                                                        +---------+---------------+---------+-----------+----------+--------------+ FV Prox  Full                                                        +---------+---------------+---------+-----------+----------+--------------+ FV Mid   Full                                                        +---------+---------------+---------+-----------+----------+--------------+ FV DistalFull                                                        +---------+---------------+---------+-----------+----------+--------------+  PFV      Full                                                        +---------+---------------+---------+-----------+----------+--------------+ POP      Full           Yes      Yes                                 +---------+---------------+---------+-----------+----------+--------------+ PTV      Full                                                        +---------+---------------+---------+-----------+----------+--------------+ PERO     Full                                                        +---------+---------------+---------+-----------+----------+--------------+   +---------+---------------+---------+-----------+----------+--------------+ LEFT     CompressibilityPhasicitySpontaneityPropertiesThrombus Aging +---------+---------------+---------+-----------+----------+--------------+ CFV      Full            Yes      Yes                                 +---------+---------------+---------+-----------+----------+--------------+ SFJ      Full                                                        +---------+---------------+---------+-----------+----------+--------------+ FV Prox  Full                                                        +---------+---------------+---------+-----------+----------+--------------+ FV Mid   Full                                                        +---------+---------------+---------+-----------+----------+--------------+ FV DistalFull                                                        +---------+---------------+---------+-----------+----------+--------------+ PFV      Full                                                        +---------+---------------+---------+-----------+----------+--------------+  POP      Full           Yes      Yes                                 +---------+---------------+---------+-----------+----------+--------------+ PTV      Full                                                        +---------+---------------+---------+-----------+----------+--------------+ PERO     Full                                                        +---------+---------------+---------+-----------+----------+--------------+     Summary: RIGHT: - There is no evidence of deep vein thrombosis in the lower extremity.  - No cystic structure found in the popliteal fossa.  LEFT: - There is no evidence of deep vein thrombosis in the lower extremity.  - No cystic structure found in the popliteal fossa.  *See table(s) above for measurements and observations. Electronically signed by Lemar Livings MD on 01/01/2022 at 7:02:06 PM.    Final    DG Abd Portable 1V  Result Date: 01/01/2022 CLINICAL DATA:  Feeding tube replacement EXAM: PORTABLE ABDOMEN - 1 VIEW COMPARISON:  01/01/2022 12:30 a.m. FINDINGS: Feeding tube tip projects  over the descending duodenum. Atherosclerotic calcification of the aortic arch. IMPRESSION: 1. The feeding tube tip currently projects over the descending duodenum. 2.  Aortic Atherosclerosis (ICD10-I70.0). Electronically Signed   By: Gaylyn Rong M.D.   On: 01/01/2022 10:01   DG Abd Portable 1V  Result Date: 01/01/2022 CLINICAL DATA:  NG tube placement EXAM: PORTABLE ABDOMEN - 1 VIEW COMPARISON:  None Available. FINDINGS: Weighted feeding tube terminates at the GE junction. Advancement at least 5 cm stomach is suggested. IMPRESSION: Weighted feeding tube terminates at the GE junction. Advancement at least 5 cm stomach is suggested. Electronically Signed   By: Charline Bills M.D.   On: 01/01/2022 00:46   DG Chest 2 View  Result Date: 12/31/2021 CLINICAL DATA:  Short of breath EXAM: CHEST - 2 VIEW COMPARISON:  12/26/2021 FINDINGS: Heart size upper normal. Vascularity normal. Lungs clear without infiltrate or effusion. Feeding tube passes into the stomach with the tip not visualized. Chronic left rib fracture IMPRESSION: No active cardiopulmonary disease. Electronically Signed   By: Marlan Palau M.D.   On: 12/31/2021 19:15    Labs:  Basic Metabolic Panel:    Latest Ref Rng & Units 01/18/2022    5:05 AM 01/12/2022    5:17 AM 01/11/2022    6:06 AM  BMP  Glucose 70 - 99 mg/dL 161  096  045   BUN 8 - 23 mg/dL 62  43  34   Creatinine 0.61 - 1.24 mg/dL 4.09  8.11  9.14   Sodium 135 - 145 mmol/L 137  129  129   Potassium 3.5 - 5.1 mmol/L 4.0  4.6  5.5   Chloride 98 - 111 mmol/L 101  98  97   CO2 22 - 32 mmol/L 24  20  23  Calcium 8.9 - 10.3 mg/dL 9.4  8.7  8.8      CBC:    Latest Ref Rng & Units 01/11/2022    6:06 AM 01/10/2022    5:55 AM 01/09/2022    5:52 AM  CBC  WBC 4.0 - 10.5 K/uL 10.6  8.0  8.5   Hemoglobin 13.0 - 17.0 g/dL 78.2  95.6  21.3   Hematocrit 39.0 - 52.0 % 33.1  31.0  32.5   Platelets 150 - 400 K/uL 181  174  165      CBG: Recent Labs  Lab 01/20/22 0616  01/20/22 1158 01/20/22 1619 01/20/22 2043 01/21/22 0555  GLUCAP 165* 259* 212* 126* 156*    Brief HPI:   Alexsis Branscom is a 71 y.o. male with history of HTN, T2DM, reported history of alcohol abuse as well as significant aspirin use (up to 10 baby ASA) daily who was admitted on 12/21/2021 after found with slurred speech and facial droop.  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  He was treated with Cardene infusion for hypertensive urgency as well as phenobarbital and CIWA protocol for EtOH withdrawal.  He was started on dysphagia 3, thin liquids but had overt aspiration requiring BiPAP and was treated with IV Unasyn as well as steroids for AECOPD.  2D echo done showing EF 65 to 70% with circumferential pericardial effusion without tamponade.    MBS done showed prolonged mastication with silent aspiration of thin and nectars therefore diet was downgraded to D1, honey liquids.  P.o. intake was poor therefore core track placed prior to discharge.  Dr. Roda Shutters felt that bleed was due to hypertensive emergency and recommended SBP less than 160.  Lisinopril was discontinued due to AKI with hyperkalemia and hydralazine was titrated upwards for better control.  He was noted to have multiple vitamin deficiencies also with vitamin D at 17.96, vitamin B6 and vitamin C deficiencies.  He was transitioned to oral steroid taper on 05/23.  Bladder incontinence was being treated with chronic catheter.  He was noted to have RUE>RLE edema as well as right hemiplegia.  He continued to have limitations in mobility as well as ability to carry out ADL tasks.  CIR was recommended due to functional decline.   Hospital Course: Gabrien Mentink was admitted to rehab 12/31/2021 for inpatient therapies to consist of PT, ST and OT at least three hours five days a week. Past admission physiatrist, therapy team and rehab  RN have worked together to provide customized collaborative inpatient rehab. He was noted to have 2-3+ edema RUE and dopplers done revealed right brachial DVT.  BLE dopplers done past admission was negative for DVT. Dr. Pearlean Brownie was consulted and recommended IV heparin while in hospital and transition to DOAC at discharge if stable neurologically. RUE edema progressed to weeping and was treated with compression and elevation.  He was maintained on tube feeds at nights due to refusal of D1 diet but he was drinking thickened fluids initially. Lytes were monitored with serial checks and water flushes have been adjusted multiple during his stay to help manage AKI, hyponatremia as well as fluid overload issues.  His blood pressures were monitored on TID basis and noted to be labile therefore amlodipine was added and titrated upwards. He was noted to be developing spasticity which was affecting activity therefore tizanidine was added but then discontinued due to concerns of sedative side effects.  He reported having abdominal pain 05/31 and KUB done showed large rectal stool ball. He was treated with multiple laxative and enemas with good results but continued to report abdominal pain as well as issues with SOB and wheezing. CXR showed increased vascular congestion therefore he was treated with of IV lasix as well as nebs. CT abdomen/pelvis was done on 06/03 showing right psoas muscle hematoma/hemorrhage. Heparin was discontinued and RUE dopplers repeated on 06/05 showing resolution of DVT. Anticoagulation not visited due to high risk of bleeding.   He continued to have low energy levels felt to be CVA related and ritalin was added to help with activation as well as overall mood.  His diet was advanced to D2 but he started refusing all intake as well as frequently refusing participation with therapy. He was started on Remeron to help with mood and appetite. He continued to refuse dysphagia diet and needed max  encouragement to participate in swallow therapy as well as min cues to implement safe swallow strategies. CT head was repeated on 06/06 showing significantly decreased hemorrhage with expected evolution. Palliative care was consulted to discuss GOC and patient declined PEG placement and elected to go home with hospice care. Foley was placed to SD due to ongoing retention and Cortak was removed.  Patient was made DNR and discharged to home with [facility] [place] for follow up care.     Rehab course: During patient's stay in rehab weekly team conferences were held to monitor patient's progress, set goals and discuss barriers to discharge. At admission, patient required total assist with basic ADL tasks and with mobility.  He exhibited moderate dysarthria with poor attention, lethargy as well as dysphagia. He was participating with therapy and making some progress progress initially but then continued to decline and was discharged to home with hospice care. He required max assist with bed mobility and Stedy for transfers. He requires supervision for orientation and needed supervision to mod assist with cognitive tasks.   Disposition: Home with hospice.   Diet: D1, honey liquids.   Special Instructions: Medication changes/intervention per Hospice team.  Discharge Instructions     Ambulatory referral to Physical Medicine Rehab   Complete by: As directed       Allergies as of 01/21/2022       Reactions   Codeine Nausea Only        Medication List     STOP taking these medications    acetaminophen 500 MG tablet Commonly known as: TYLENOL   albuterol 108 (90 Base) MCG/ACT inhaler Commonly known as: VENTOLIN HFA Replaced by: albuterol (2.5 MG/3ML) 0.083% nebulizer solution   aspirin EC 325 MG tablet   feeding supplement (JEVITY 1.5 CAL/FIBER) Liqd   feeding supplement (PROSource TF) liquid   free water Soln   lisinopril 20 MG tablet Commonly known as: ZESTRIL   metFORMIN 500  MG tablet Commonly known as: GLUCOPHAGE   nicotine 21 mg/24hr patch Commonly known as: NICODERM CQ - dosed in mg/24 hours   pantoprazole 40 MG tablet Commonly known as: PROTONIX Replaced by: pantoprazole sodium 40 mg   predniSONE 10 MG tablet Commonly known as: DELTASONE   rosuvastatin 20 MG tablet Commonly known as: CRESTOR   thiamine 100 MG tablet       TAKE these medications    albuterol (2.5 MG/3ML) 0.083% nebulizer solution Commonly known as: PROVENTIL Take 3 mLs (2.5 mg total) by nebulization every 6 (six) hours as needed for wheezing or shortness of breath. Replaces: albuterol  108 (90 Base) MCG/ACT inhaler   amLODipine 10 MG tablet Commonly known as: NORVASC Take 1 tablet (10 mg total) by mouth daily.   atenolol 50 MG tablet Commonly known as: TENORMIN Take 1 tablet (50 mg total) by mouth daily. Start taking on: January 22, 2022   budesonide 0.5 MG/2ML nebulizer solution Commonly known as: PULMICORT Take 2 mLs (0.5 mg total) by nebulization 2 (two) times daily.   chlorhexidine 0.12 % solution Commonly known as: PERIDEX 15 mLs by Mouth Rinse route 2 (two) times daily.   doxazosin 1 MG tablet Commonly known as: CARDURA Take 1 tablet (1 mg total) by mouth daily after supper.   hydrALAZINE 50 MG tablet Commonly known as: APRESOLINE Take 1 tablet (50 mg total) by mouth every 8 (eight) hours.   insulin glargine 100 UNIT/ML Solostar Pen Commonly known as: LANTUS Inject 5 Units into the skin at bedtime.   LORazepam 1 MG tablet--30 tabs Commonly known as: ATIVAN Take 1 tablet (1 mg total) by mouth every 4 (four) hours as needed for anxiety.   melatonin 3 MG Tabs tablet Take 1 tablet (3 mg total) by mouth at bedtime.   methocarbamol 500 MG tablet Commonly known as: ROBAXIN Take 1 tablet (500 mg total) by mouth every 6 (six) hours as needed for muscle spasms.   mirtazapine 15 MG disintegrating tablet Commonly known as: REMERON SOL-TAB Take 1 tablet (15  mg total) by mouth at bedtime.   morphine CONCENTRATE 10 MG/0.5ML Soln concentrated solution--Rx-15 ml  Take 0.25 mLs (5 mg total) by mouth every 2 (two) hours as needed for shortness of breath, anxiety or moderate pain.   pantoprazole sodium 40 mg Commonly known as: PROTONIX Take 40 mg by mouth daily. Replaces: pantoprazole 40 MG tablet   Pen Needles 32G X 5 MM Misc 1 application  by Does not apply route daily.   polyethylene glycol 17 g packet Commonly known as: MIRALAX / GLYCOLAX Take 17 g by mouth daily. Start taking on: January 22, 2022   senna-docusate 8.6-50 MG tablet Commonly known as: Senokot-S Take 1 tablet by mouth 2 (two) times daily.   umeclidinium-vilanterol 62.5-25 MCG/ACT Aepb Commonly known as: ANORO ELLIPTA Inhale 1 puff into the lungs daily.        Follow-up Information     Kirsteins, Victorino SparrowAndrew E, MD Follow up.   Specialty: Physical Medicine and Rehabilitation Contact information: [address] [place] [address] [phone]         [facility].   Contact information: [address] [place] [address] [phone]         [facility] Follow up.   Contact information: [address]     [address] [place] [phone]                Signed: Jacquelynn Creeamela S Destin Kittler 01/21/2022, 6:42 PM

## 2022-01-21 NOTE — Plan of Care (Signed)
Incontinent of bowel Foley placed for bladder retention

## 2022-01-21 NOTE — Plan of Care (Signed)
  Problem: RH Balance Goal: LTG Patient will maintain dynamic standing balance (PT) Description: LTG:  Patient will maintain dynamic standing balance with assistance during mobility activities (PT) Outcome: Not Met (add Reason) Note: Decline in status with change in d/c to Hospice care    Problem: Sit to Stand Goal: LTG:  Patient will perform sit to stand with assistance level (PT) Description: LTG:  Patient will perform sit to stand with assistance level (PT) Outcome: Not Met (add Reason) Note: Decline in status with change in d/c to Hospice care    Problem: RH Bed Mobility Goal: LTG Patient will perform bed mobility with assist (PT) Description: LTG: Patient will perform bed mobility with assistance, with/without cues (PT). Outcome: Not Met (add Reason)   Problem: RH Bed to Chair Transfers Goal: LTG Patient will perform bed/chair transfers w/assist (PT) Description: LTG: Patient will perform bed to chair transfers with assistance (PT). Outcome: Not Met (add Reason)   Problem: RH Car Transfers Goal: LTG Patient will perform car transfers with assist (PT) Description: LTG: Patient will perform car transfers with assistance (PT). Outcome: Not Met (add Reason)   Problem: RH Ambulation Goal: LTG Patient will ambulate in controlled environment (PT) Description: LTG: Patient will ambulate in a controlled environment, # of feet with assistance (PT). Outcome: Not Met (add Reason)   Problem: RH Wheelchair Mobility Goal: LTG Patient will propel w/c in controlled environment (PT) Description: LTG: Patient will propel wheelchair in controlled environment, # of feet with assist (PT) Outcome: Not Met (add Reason) Note: Decline in status with change in d/c to Hospice care  Goal: LTG Patient will propel w/c in home environment (PT) Description: LTG: Patient will propel wheelchair in home environment, # of feet with assistance (PT). Outcome: Not Met (add Reason) Flowsheets (Taken 01/21/2022  0742) LTG: Propel w/c distance in home environment: Decline in status with change in d/c to Hospice care   Problem: RH Balance Goal: LTG Patient will maintain dynamic sitting balance (PT) Description: LTG:  Patient will maintain dynamic sitting balance with assistance during mobility activities (PT) Outcome: Completed/Met

## 2022-01-21 NOTE — Progress Notes (Signed)
Inpatient Rehabilitation Care Coordinator Discharge Note   Patient Details  Name: Ronnie Jones MRN: [id number] Date of Birth: [birth year]   Discharge location: Home with hospice  Length of Stay: 21 days  Discharge activity level: total assist  Home/community participation: spouse  Patient response WU:[id number] Literacy - How often do you need to have someone help you when you read instructions, pamphlets, or other written material from your doctor or pharmacy?: Patient unable to respond  Patient response YN:[id number] Isolation - How often do you feel lonely or isolated from those around you?: Patient unable to respond  Services provided included: MD, RD, PT, OT, SLP, RN, CM, TR, Pharmacy, Neuropsych, SW  Financial Services:  Field seismologist Utilized: [employer]  Choices offered to/list presented to:    Follow-up services arranged:  Other (Comment)           Patient response to transportation need: Is the patient able to respond to transportation needs?: Yes In the past 12 months, has lack of transportation kept you from medical appointments or from getting medications?: No In the past 12 months, has lack of transportation kept you from meetings, work, or from getting things needed for daily living?: No    Comments (or additional information):  Patient/Family verbalized understanding of follow-up arrangements:  Yes  Individual responsible for coordination of the follow-up plan: Elease Hashimoto  Confirmed correct DME delivered: Andria Rhein 01/21/2022    Andria Rhein

## 2022-01-21 NOTE — Progress Notes (Signed)
Inpatient Rehabilitation Discharge Medication Review by a Pharmacist  A complete drug regimen review was completed for this patient to identify any potential clinically significant medication issues.  High Risk Drug Classes Is patient taking? Indication by Medication  Antipsychotic No   Anticoagulant No   Antibiotic No   Opioid Yes Morphine prn - pain  Antiplatelet No   Hypoglycemics/insulin Yes Lantus - glucose control  Vasoactive Medication Yes Atenolol, Amlodipine, Hydralazine - blood pressure Doxazosin - BP and BPH  Chemotherapy No   Other Yes Budesonide nebs and Anoro inhaler - COPD Mirtazapine and Melatonin - sleep Pantoprazole - GI prophylaxis Miralax, Senokot S - laxatives PRNs: Albuterol - shortness of breath or wheezing Lorazepam - anxiety Methocarbamol - muscle spasms     Type of Medication Issue Identified Description of Issue Recommendation(s)  Drug Interaction(s) (clinically significant)     Duplicate Therapy     Allergy     No Medication Administration End Date     Incorrect Dose     Additional Drug Therapy Needed     Significant med changes from prior encounter (inform family/care partners about these prior to discharge). Multiple medications discontinued: Metformin, Aspirin, Lisinopril, Rosuvastatin, Thiamine, Folate,Tube feedings and free water.   Other       Clinically significant medication issues were identified that warrant physician communication and completion of prescribed/recommended actions by midnight of the next day:  No  Pharmacist comments:   Time spent performing this drug regimen review (minutes):  20   Dennie Fetters, [place] 01/21/2022 12:15 PM

## 2022-02-06 DEATH — deceased

## 2022-02-12 ENCOUNTER — Other Ambulatory Visit: Payer: Self-pay | Admitting: Physical Medicine and Rehabilitation

## 2022-02-17 ENCOUNTER — Other Ambulatory Visit: Payer: Self-pay | Admitting: Physical Medicine & Rehabilitation
